# Patient Record
Sex: Female | Born: 1970 | Race: White | Hispanic: No | Smoking: Current some day smoker
Health system: Southern US, Community
[De-identification: ages and names within clinical notes are randomized; demographics above are authoritative.]

## PROBLEM LIST (undated history)

## (undated) DIAGNOSIS — K589 Irritable bowel syndrome without diarrhea: Secondary | ICD-10-CM

## (undated) DIAGNOSIS — G8929 Other chronic pain: Secondary | ICD-10-CM

## (undated) DIAGNOSIS — K859 Acute pancreatitis without necrosis or infection, unspecified: Secondary | ICD-10-CM

## (undated) DIAGNOSIS — L409 Psoriasis, unspecified: Secondary | ICD-10-CM

## (undated) DIAGNOSIS — B9689 Other specified bacterial agents as the cause of diseases classified elsewhere: Secondary | ICD-10-CM

## (undated) DIAGNOSIS — K219 Gastro-esophageal reflux disease without esophagitis: Secondary | ICD-10-CM

## (undated) DIAGNOSIS — N760 Acute vaginitis: Secondary | ICD-10-CM

## (undated) DIAGNOSIS — F419 Anxiety disorder, unspecified: Secondary | ICD-10-CM

## (undated) DIAGNOSIS — F329 Major depressive disorder, single episode, unspecified: Secondary | ICD-10-CM

## (undated) DIAGNOSIS — F32A Depression, unspecified: Secondary | ICD-10-CM

## (undated) DIAGNOSIS — R109 Unspecified abdominal pain: Secondary | ICD-10-CM

## (undated) DIAGNOSIS — F1010 Alcohol abuse, uncomplicated: Secondary | ICD-10-CM

## (undated) DIAGNOSIS — F410 Panic disorder [episodic paroxysmal anxiety] without agoraphobia: Secondary | ICD-10-CM

## (undated) DIAGNOSIS — E119 Type 2 diabetes mellitus without complications: Secondary | ICD-10-CM

## (undated) HISTORY — DX: Gastro-esophageal reflux disease without esophagitis: K21.9

## (undated) HISTORY — DX: Type 2 diabetes mellitus without complications: E11.9

## (undated) HISTORY — DX: Acute pancreatitis without necrosis or infection, unspecified: K85.90

## (undated) HISTORY — DX: Acute vaginitis: N76.0

## (undated) HISTORY — PX: BLADDER SURGERY: SHX569

## (undated) HISTORY — DX: Irritable bowel syndrome, unspecified: K58.9

## (undated) HISTORY — DX: Other specified bacterial agents as the cause of diseases classified elsewhere: B96.89

---

## 2001-09-06 ENCOUNTER — Ambulatory Visit (HOSPITAL_BASED_OUTPATIENT_CLINIC_OR_DEPARTMENT_OTHER): Admission: RE | Admit: 2001-09-06 | Discharge: 2001-09-06 | Payer: Self-pay | Admitting: Urology

## 2001-11-19 ENCOUNTER — Ambulatory Visit (HOSPITAL_COMMUNITY): Admission: RE | Admit: 2001-11-19 | Discharge: 2001-11-19 | Payer: Self-pay | Admitting: Family Medicine

## 2001-11-19 ENCOUNTER — Encounter: Payer: Self-pay | Admitting: Family Medicine

## 2001-12-23 ENCOUNTER — Encounter: Payer: Self-pay | Admitting: *Deleted

## 2001-12-23 ENCOUNTER — Emergency Department (HOSPITAL_COMMUNITY): Admission: EM | Admit: 2001-12-23 | Discharge: 2001-12-23 | Payer: Self-pay | Admitting: *Deleted

## 2002-04-15 ENCOUNTER — Emergency Department (HOSPITAL_COMMUNITY): Admission: EM | Admit: 2002-04-15 | Discharge: 2002-04-15 | Payer: Self-pay | Admitting: Emergency Medicine

## 2002-04-25 ENCOUNTER — Ambulatory Visit (HOSPITAL_COMMUNITY): Admission: RE | Admit: 2002-04-25 | Discharge: 2002-04-25 | Payer: Self-pay

## 2002-04-25 ENCOUNTER — Encounter: Payer: Self-pay | Admitting: Family Medicine

## 2002-08-29 ENCOUNTER — Encounter: Payer: Self-pay | Admitting: Emergency Medicine

## 2002-08-29 ENCOUNTER — Emergency Department (HOSPITAL_COMMUNITY): Admission: EM | Admit: 2002-08-29 | Discharge: 2002-08-29 | Payer: Self-pay | Admitting: Emergency Medicine

## 2002-10-19 ENCOUNTER — Emergency Department (HOSPITAL_COMMUNITY): Admission: EM | Admit: 2002-10-19 | Discharge: 2002-10-19 | Payer: Self-pay | Admitting: *Deleted

## 2002-11-07 ENCOUNTER — Ambulatory Visit (HOSPITAL_BASED_OUTPATIENT_CLINIC_OR_DEPARTMENT_OTHER): Admission: RE | Admit: 2002-11-07 | Discharge: 2002-11-07 | Payer: Self-pay | Admitting: Urology

## 2003-10-20 ENCOUNTER — Ambulatory Visit (HOSPITAL_COMMUNITY): Admission: RE | Admit: 2003-10-20 | Discharge: 2003-10-20 | Payer: Self-pay | Admitting: Urology

## 2003-10-20 ENCOUNTER — Ambulatory Visit (HOSPITAL_BASED_OUTPATIENT_CLINIC_OR_DEPARTMENT_OTHER): Admission: RE | Admit: 2003-10-20 | Discharge: 2003-10-20 | Payer: Self-pay | Admitting: Urology

## 2004-12-31 ENCOUNTER — Ambulatory Visit (HOSPITAL_COMMUNITY): Admission: RE | Admit: 2004-12-31 | Discharge: 2004-12-31 | Payer: Self-pay | Admitting: Urology

## 2005-03-15 ENCOUNTER — Ambulatory Visit (HOSPITAL_COMMUNITY): Admission: RE | Admit: 2005-03-15 | Discharge: 2005-03-15 | Payer: Self-pay | Admitting: Family Medicine

## 2005-05-24 ENCOUNTER — Other Ambulatory Visit: Admission: RE | Admit: 2005-05-24 | Discharge: 2005-05-24 | Payer: Self-pay

## 2005-11-12 ENCOUNTER — Emergency Department (HOSPITAL_COMMUNITY): Admission: EM | Admit: 2005-11-12 | Discharge: 2005-11-12 | Payer: Self-pay | Admitting: Emergency Medicine

## 2006-02-28 ENCOUNTER — Encounter (INDEPENDENT_AMBULATORY_CARE_PROVIDER_SITE_OTHER): Payer: Self-pay | Admitting: *Deleted

## 2006-02-28 ENCOUNTER — Other Ambulatory Visit: Admission: RE | Admit: 2006-02-28 | Discharge: 2006-02-28 | Payer: Self-pay | Admitting: Unknown Physician Specialty

## 2006-02-28 ENCOUNTER — Encounter (INDEPENDENT_AMBULATORY_CARE_PROVIDER_SITE_OTHER): Payer: Self-pay | Admitting: Specialist

## 2006-05-24 ENCOUNTER — Emergency Department (HOSPITAL_COMMUNITY): Admission: EM | Admit: 2006-05-24 | Discharge: 2006-05-24 | Payer: Self-pay | Admitting: Emergency Medicine

## 2008-08-01 ENCOUNTER — Ambulatory Visit (HOSPITAL_COMMUNITY): Admission: RE | Admit: 2008-08-01 | Discharge: 2008-08-01 | Payer: Self-pay | Admitting: Family Medicine

## 2009-06-29 ENCOUNTER — Emergency Department (HOSPITAL_COMMUNITY): Admission: EM | Admit: 2009-06-29 | Discharge: 2009-06-29 | Payer: Self-pay | Admitting: Emergency Medicine

## 2009-09-09 ENCOUNTER — Emergency Department (HOSPITAL_COMMUNITY): Admission: EM | Admit: 2009-09-09 | Discharge: 2009-09-09 | Payer: Self-pay | Admitting: Emergency Medicine

## 2010-07-31 ENCOUNTER — Emergency Department (HOSPITAL_COMMUNITY)
Admission: EM | Admit: 2010-07-31 | Discharge: 2010-08-01 | Disposition: A | Discharge status: Home / Self Care | Payer: Self-pay | Attending: Emergency Medicine | Admitting: Emergency Medicine

## 2010-07-31 DIAGNOSIS — J40 Bronchitis, not specified as acute or chronic: Secondary | ICD-10-CM | POA: Insufficient documentation

## 2010-07-31 DIAGNOSIS — B9689 Other specified bacterial agents as the cause of diseases classified elsewhere: Secondary | ICD-10-CM | POA: Insufficient documentation

## 2010-07-31 DIAGNOSIS — R112 Nausea with vomiting, unspecified: Secondary | ICD-10-CM | POA: Insufficient documentation

## 2010-07-31 DIAGNOSIS — A499 Bacterial infection, unspecified: Secondary | ICD-10-CM | POA: Insufficient documentation

## 2010-07-31 DIAGNOSIS — N760 Acute vaginitis: Secondary | ICD-10-CM | POA: Insufficient documentation

## 2010-07-31 LAB — URINALYSIS, ROUTINE W REFLEX MICROSCOPIC
Bilirubin Urine: NEGATIVE
Leukocytes, UA: NEGATIVE
Nitrite: NEGATIVE
pH: 6 (ref 5.0–8.0)

## 2010-07-31 LAB — CBC
HCT: 34.1 % — ABNORMAL LOW (ref 36.0–46.0)
MCH: 25.9 pg — ABNORMAL LOW (ref 26.0–34.0)
MCV: 84 fL (ref 78.0–100.0)
Platelets: 380 10*3/uL (ref 150–400)

## 2010-07-31 LAB — WET PREP, GENITAL: Yeast Wet Prep HPF POC: NONE SEEN

## 2010-07-31 LAB — URINE MICROSCOPIC-ADD ON

## 2010-07-31 LAB — PREGNANCY, URINE: Preg Test, Ur: NEGATIVE

## 2010-07-31 LAB — DIFFERENTIAL
Basophils Absolute: 0 10*3/uL (ref 0.0–0.1)
Basophils Relative: 0 % (ref 0–1)
Lymphocytes Relative: 9 % — ABNORMAL LOW (ref 12–46)
Lymphs Abs: 0.9 10*3/uL (ref 0.7–4.0)
Monocytes Absolute: 0.8 10*3/uL (ref 0.1–1.0)
Monocytes Relative: 8 % (ref 3–12)

## 2010-07-31 LAB — BASIC METABOLIC PANEL
CO2: 21 mEq/L (ref 19–32)
Calcium: 8.5 mg/dL (ref 8.4–10.5)
Chloride: 107 mEq/L (ref 96–112)
GFR calc Af Amer: >60 mL/min (ref >60)
GFR calc non Af Amer: >60 mL/min (ref >60)
Glucose, Bld: 129 mg/dL — ABNORMAL HIGH (ref 70–99)
Sodium: 140 mEq/L (ref 135–145)

## 2010-08-01 ENCOUNTER — Emergency Department (HOSPITAL_COMMUNITY): Payer: Self-pay

## 2010-08-01 LAB — RPR: RPR Ser Ql: NONREACTIVE

## 2010-08-02 LAB — GC/CHLAMYDIA PROBE AMP, GENITAL: Chlamydia, DNA Probe: NEGATIVE

## 2010-08-05 ENCOUNTER — Emergency Department (HOSPITAL_COMMUNITY)
Admission: EM | Admit: 2010-08-05 | Discharge: 2010-08-06 | Disposition: A | Discharge status: Home / Self Care | Payer: Self-pay | Attending: Emergency Medicine | Admitting: Emergency Medicine

## 2010-08-05 DIAGNOSIS — R300 Dysuria: Secondary | ICD-10-CM | POA: Insufficient documentation

## 2010-08-05 DIAGNOSIS — R109 Unspecified abdominal pain: Secondary | ICD-10-CM | POA: Insufficient documentation

## 2010-08-05 DIAGNOSIS — R110 Nausea: Secondary | ICD-10-CM | POA: Insufficient documentation

## 2010-08-06 LAB — URINALYSIS, ROUTINE W REFLEX MICROSCOPIC
Leukocytes, UA: NEGATIVE
Specific Gravity, Urine: 1.01 (ref 1.005–1.030)
Urine Glucose, Fasting: NEGATIVE mg/dL
pH: 6.5 (ref 5.0–8.0)

## 2010-08-06 LAB — URINE MICROSCOPIC-ADD ON

## 2010-09-05 LAB — URINALYSIS, ROUTINE W REFLEX MICROSCOPIC
Glucose, UA: NEGATIVE mg/dL
Ketones, ur: NEGATIVE mg/dL
Leukocytes, UA: NEGATIVE
Nitrite: NEGATIVE
Protein, ur: NEGATIVE mg/dL
Specific Gravity, Urine: 1.015 (ref 1.005–1.030)
pH: 5.5 (ref 5.0–8.0)

## 2010-10-29 NOTE — Op Note (Signed)
NAME:  Monique Nguyen, ROSADO             ACCOUNT NO.:  [id number]   MEDICAL RECORD NO.:  [id number]          PATIENT TYPE:  AMB   LOCATION:  DAY                          FACILITY:  [facility]   PHYSICIAN:  Jamison Neighbor, M.D.  DATE OF BIRTH:  19-Dec-1970   DATE OF PROCEDURE:  12/31/2004  DATE OF DISCHARGE:                                 OPERATIVE REPORT   SERVICE:  Urology.   PREOPERATIVE DIAGNOSES:  Chronic painful bladder syndrome, interstitial  cystitis.   POSTOPERATIVE DIAGNOSES:  Chronic painful bladder syndrome, interstitial  cystitis.   PROCEDURE:  Cystoscopy, urethral calibration, hydrodistention of the  bladder, Marcaine and Pyridium installation, Marcaine and Kenalog injection.   SURGEON:  Jamison Neighbor, M.D.   ANESTHESIA:  General.   COMPLICATIONS:  None.   DRAINS:  None.   BRIEF HISTORY:  This 40 year old female has been a longstanding patient of  Dr. Patsi Sears. She came to me for a second opinion concerning interstitial  cystitis. The patient previously had been told in the past that she had  chronic UTI's but more recently has been told that she has interstitial  cystitis. She has had hydrodistention before in the past with good results.  The patient has requested that this be repeated. We may have to do this in  order to give her an opinion as to the situation so far as her bladder is  concerned. She understands the risks and benefits of the procedure and gave  full and informed consent.   DESCRIPTION OF PROCEDURE:  After successful induction of general anesthesia,  the patient was placed in the dorsal lithotomy position, prepped with  Betadine and draped in the usual sterile fashion. Careful bimanual  examination showed no abnormality of the urethra, there was no evidence of a  cystocele, rectocele or enterocele. There were no masses on bimanual exam.  The uterus was palpably normal. There was no discharge of any kind and no  signs of urethral diverticulum. The  urethra was calibrated to 51 [place] with  female urethral sounds with no evidence of stenosis or stricture. The  cystoscope was inserted, the bladder was carefully inspected and was free of  any tumor or stones. Both ureteral orifices were normal in configuration and  location. The bladder was then distended at a pressure of 100 cmH2O for five  minutes. When the bladder was drained, the patient had a bladder capacity of  1500 mL which is excellent and compares to a bladder capacity of 1150 and an  average for interstitial cystitis of 575 under full anesthesia. The patient  had very modest glomerulations certainly within the range of normal. This  would really suggest that the patient may not have classic interstitial  cystitis. The bladder was noted to have trabeculations raising the question  as to whether the patient may have some degree of pelvic floor dysfunction  that might be amendable to physical therapy. The patient did not require a  biopsy. Two previous biopsy sites were  identified. The bladder was drained, a mixture of Marcaine and Pyridium was  left within the bladder, Marcaine and Kenalog were  injected periurethrally.  The patient tolerated the procedure well and was taken to the recovery room  in good condition.     _______________    RJE/MEDQ  D:  12/31/2004  T:  12/31/2004  Job:  [id number]

## 2010-10-29 NOTE — Op Note (Signed)
NAME:  Monique Nguyen, Monique Nguyen                       ACCOUNT NO.:  [id number]   MEDICAL RECORD NO.:  [id number]                   PATIENT TYPE:  AMB   LOCATION:  [facility]                                 FACILITY:  [facility]   PHYSICIAN:  Sigmund I. Patsi Sears, M.D.         DATE OF BIRTH:  06-17-1970   DATE OF PROCEDURE:  10/20/2003  DATE OF DISCHARGE:                                 OPERATIVE REPORT   PREOPERATIVE DIAGNOSIS:  Interstitial cystitis.   POSTOPERATIVE DIAGNOSIS:  Interstitial cystitis.   OPERATION:  1. Cystourethroscopy.  2. Hydrodistention of the bladder.  3. Clorpactin insertion.  4. Pyridium insertion.  5. Marcaine, Kenalog injection in the base of the bladder.   SURGEON:  Sigmund I. Patsi Sears, M.D.   ANESTHESIA:  General LMA.   PREPARATION:  After appropriate pre-anesthesia, the patient was brought to  the operating room, placed upon the operating table in the dorsal supine  position where general LMA anesthesia was introduced.  She was then replaced  in a dorsal lithotomy position where the pubis was prepped with Betadine  solution and draped in the usual fashion.   PROCEDURE:  Cystourethroscopy was accomplished, and showed that the patient  had a 1,000 mL bladder capacity.  Repeat cystoscopy showed that the patient  had massive hemorrhages associated with interstitial cystitis.  The patient  had previously had bladder biopsy and it was felt not necessary for this  repeat cysto-HOD.  Therefore, Clorpactin was inserted in the bladder for 3  minutes, and then the bladder drained of fluid.  The bladder was then filled  with saline solution, and this was then drained out of the bladder.  The  patient then had Pyridium Marcaine solution inserted in the bladder, and  this remained in the bladder.  Marcaine Kenalog was then injected at the  bladder base.  Previously, the patient was given a __________ suppository,  and then she was given IV Toradol prior to awaking.  She was  covered with  Ancef for the procedure.  She was awakened, taken to the recovery room in  good condition.                                               Sigmund I. Patsi Sears, M.D.    SIT/MEDQ  D:  10/20/2003  T:  10/20/2003  Job:  [id number]

## 2010-11-01 ENCOUNTER — Emergency Department (HOSPITAL_COMMUNITY)
Admission: EM | Admit: 2010-11-01 | Discharge: 2010-11-02 | Disposition: A | Discharge status: Home / Self Care | Payer: Self-pay | Attending: Emergency Medicine | Admitting: Emergency Medicine

## 2010-11-01 DIAGNOSIS — K5289 Other specified noninfective gastroenteritis and colitis: Secondary | ICD-10-CM | POA: Insufficient documentation

## 2010-11-01 DIAGNOSIS — F329 Major depressive disorder, single episode, unspecified: Secondary | ICD-10-CM | POA: Insufficient documentation

## 2010-11-01 DIAGNOSIS — F3289 Other specified depressive episodes: Secondary | ICD-10-CM | POA: Insufficient documentation

## 2010-11-01 LAB — URINALYSIS, ROUTINE W REFLEX MICROSCOPIC
Ketones, ur: NEGATIVE mg/dL
Leukocytes, UA: NEGATIVE
Protein, ur: NEGATIVE mg/dL
Specific Gravity, Urine: 1.015 (ref 1.005–1.030)
Urobilinogen, UA: 0.2 mg/dL (ref 0.0–1.0)
pH: 5 (ref 5.0–8.0)

## 2010-11-01 LAB — URINE MICROSCOPIC-ADD ON

## 2010-11-01 LAB — POCT PREGNANCY, URINE: Preg Test, Ur: NEGATIVE

## 2010-11-10 ENCOUNTER — Ambulatory Visit (INDEPENDENT_AMBULATORY_CARE_PROVIDER_SITE_OTHER): Payer: Self-pay | Admitting: Gastroenterology

## 2010-11-10 ENCOUNTER — Encounter: Payer: Self-pay | Admitting: Gastroenterology

## 2010-11-10 VITALS — BP 102/66 | HR 112 | Temp 98.1°F | Ht 61.0 in | Wt 99.8 lb

## 2010-11-10 DIAGNOSIS — R110 Nausea: Secondary | ICD-10-CM

## 2010-11-10 DIAGNOSIS — R197 Diarrhea, unspecified: Secondary | ICD-10-CM

## 2010-11-10 DIAGNOSIS — N898 Other specified noninflammatory disorders of vagina: Secondary | ICD-10-CM

## 2010-11-10 DIAGNOSIS — R109 Unspecified abdominal pain: Secondary | ICD-10-CM

## 2010-11-10 MED ORDER — ONDANSETRON HCL 4 MG PO TABS: 4.0000 mg | ORAL_TABLET | Freq: Three times a day (TID) | ORAL | Status: DC | PRN

## 2010-11-10 MED ORDER — DICYCLOMINE HCL 10 MG PO CAPS: 10.0000 mg | ORAL_CAPSULE | Freq: Four times a day (QID) | ORAL | Status: AC

## 2010-11-10 MED ORDER — DEXLANSOPRAZOLE 60 MG PO CPDR: 60.0000 mg | DELAYED_RELEASE_CAPSULE | Freq: Every day | ORAL | Status: AC

## 2010-11-10 MED ORDER — ONDANSETRON HCL 4 MG PO TABS: 4.0000 mg | ORAL_TABLET | Freq: Three times a day (TID) | ORAL | Status: AC | PRN

## 2010-11-10 NOTE — Progress Notes (Signed)
Called Zofran 4mg  # 20 to [employer] [facility]. (One every 8 hours as needed for nausea)

## 2010-11-10 NOTE — Progress Notes (Signed)
Referring Provider: Kirk Ruths, MD Primary Care Physician:  Kirk Ruths, MD Primary Gastroenterologist:  Dr. Darrick Penna  Chief Complaint  Patient presents with  . Nausea  . Diarrhea    HPI:  Monique Nguyen is a 40 y.o. female here as a referral from Dr. Regino Schultze for diarrhea, abdominal pain, nausea. She is quite eccentric; it was somewhat difficult to get information as to her clinical history. She was quite talkative and discussed issues that did not pertain to GI (such as bacterial vaginosis, prior ETOH use, etc). She began by saying that she wanted to confess something. States her husband passed away in 2011-02-21due to brain cancer; she was left with 3 kids. She started drinking heavily in December 2011, but she quit 2 mos ago. She wonders if this may have something to do with her current symptoms.  She reports a chronic vaginal discharge X 6 weeks; states she has been diagnosed with BV. She has been on several different abx per her report. She states at the onset of the abx, she began to experience watery diarrhea. This is not daily; she has bouts of loose stools, with some days not BM at all. The most loose stools she will have is 3 in one day. This is usually postprandial. Hx IBS  Wakes in evening with crampy, lower abdominal pain, relieved after loose stools. Takes Miralax every other day to "stay regular". Was given Lomotil in ED, which she states has significantly improved symptoms. Denies melena or brbpr.   Also complains of nausea, onset about 6 weeks ago. Constant. Not worsened/exacerbated by eating or drinking. LUQ pain occasionally with eating, lasting about 15 minutes.Decreased appetite. No dysphagia/odynophagia.Hx of reflux, took Prilosec in past. Aleve rarely. No ASA powders. +smoker. Denies fever/chills. Feels edgy/anxious. Requesting more Zofran.    Past Medical History  Diagnosis Date  . GERD (gastroesophageal reflux disease)   . Interstitial cystitis   .  Bacterial vaginosis     Past Surgical History  Procedure Date  . Bladder surgery     X 3    Current Outpatient Prescriptions  Medication Sig Dispense Refill  . citalopram (CELEXA) 40 MG tablet Take 40 mg by mouth daily.        . clonazePAM (KLONOPIN) 2 MG tablet Take 2 mg by mouth 2 (two) times daily as needed.              . diphenoxylate-atropine (LOMOTIL) 2.5-0.025 MG per tablet Take 1 tablet by mouth 4 (four) times daily as needed.        . ondansetron (ZOFRAN) 4 MG tablet Take 1 tablet (4 mg total) by mouth every 8 (eight) hours as needed for nausea.  20 tablet  0    Allergies as of 11/10/2010 - Review Complete 11/10/2010  Allergen Reaction Noted  . Sulfa antibiotics  11/10/2010    Family History  Problem Relation Age of Onset  . Depression Mother     living  . Pancreatic cancer Father     living    History   Social History  . Marital Status: Widowed    Spouse Name: N/A    Number of Children: N/A  . Years of Education: N/A   Occupational History  . Not on file.   Social History Main Topics  . Smoking status: Current Everyday Smoker -- 1.5 packs/day    Types: Cigarettes  . Smokeless tobacco: Not on file  . Alcohol Use: No  . Drug Use: No  Review of Systems: Gen: Denies any fever, chills, sweats, anorexia, fatigue, weakness, malaise, weight loss, and sleep disorder CV: Denies chest pain, angina, palpitations, syncope, orthopnea, PND, peripheral edema, and claudication. Resp: Denies dyspnea at rest, dyspnea with exercise, cough, sputum, wheezing, coughing up blood, and pleurisy. GI: Denies vomiting blood, jaundice, and fecal incontinence.   Denies dysphagia or odynophagia. GU : Denies urinary burning, blood in urine, urinary frequency, urinary hesitancy, nocturnal urination, and urinary incontinence. MS: Denies joint pain, limitation of movement, and swelling, stiffness, low back pain, extremity pain. Denies muscle weakness, cramps, atrophy.  Derm:  Denies rash, itching, dry skin, hives, moles, warts, or unhealing ulcers.  Psych: Denies depression, anxiety, memory loss, suicidal ideation, hallucinations, paranoia, and confusion. Heme: Denies bruising, bleeding, and enlarged lymph nodes.  Physical Exam: BP 102/66  Pulse 112  Temp(Src) 98.1 F (36.7 C) (Temporal)  Ht 5\' 1"  (1.549 m)  Wt 99 lb 12.8 oz (45.269 kg)  BMI 18.86 kg/m2  LMP 10/18/2010 General:   Alert,  Petite but well-nourished.  Head:  Normocephalic and atraumatic. Eyes:  Sclera clear, no icterus.   Conjunctiva pink. Ears:  Normal auditory acuity. Nose:  No deformity, discharge,  or lesions. Mouth:  No deformity or lesions, dentition normal. Neck:  Supple; no masses or thyromegaly. Lungs:  Clear throughout to auscultation.   No wheezes, crackles, or rhonchi. No acute distress. Heart:  Regular rate and rhythm; no murmurs, clicks, rubs,  or gallops. Abdomen:  Soft, nontender and nondistended. No masses, hepatosplenomegaly or hernias noted. Normal bowel sounds, without guarding, and without rebound.   Msk:  Symmetrical without gross deformities. Normal posture. Extremities:  Without clubbing or edema. Neurologic:  Alert and  oriented x4;  grossly normal neurologically. Skin:  Intact without significant lesions or rashes. Cervical Nodes:  No significant cervical adenopathy. Psych:  Alert and cooperative. Normal mood and affect.

## 2010-11-10 NOTE — Patient Instructions (Addendum)
Complete lab work and stool studies.   Once stool studies returned to our office, we will start a trial of Bentyl.  Avoid dairy for now to see if this helps with your symptoms.   Start taking Dexilant 30 minutes before breakfast daily. This may take 10-14 days for full affect.  Follow a high fiber diet. Do not take Miralax for now. See the handout on High fiber and IBS. Do not take Lomotil anymore.   We will refer you to see Dr. Emelda Fear again.   We may need to do further tests after the labs are completed. We will be calling you with these results in the next few days. We also will need you to check back with our office in 2 weeks to let us know how you are doing. At that time, we may need to pursue other tests.   Please call us if nausea or abdominal pain worsens. We will be in contact soon.

## 2010-11-11 DIAGNOSIS — N898 Other specified noninflammatory disorders of vagina: Secondary | ICD-10-CM | POA: Insufficient documentation

## 2010-11-11 DIAGNOSIS — R197 Diarrhea, unspecified: Secondary | ICD-10-CM | POA: Insufficient documentation

## 2010-11-11 DIAGNOSIS — R112 Nausea with vomiting, unspecified: Secondary | ICD-10-CM | POA: Insufficient documentation

## 2010-11-11 DIAGNOSIS — R109 Unspecified abdominal pain: Secondary | ICD-10-CM | POA: Insufficient documentation

## 2010-11-11 NOTE — Assessment & Plan Note (Addendum)
Hx of IBS, noticed increase in loose stools X 6 weeks since start of abx for bacterial vaginosis. Appears to be improving since onset of lomotil. Max stools per day is 3: usually postprandial. Most likely IBS, mixed, as pt will alternate constipation/diarrhea. Please see notes under abdominal pain. Will check Cdiff due to recent abx, add fiber to diet, trial of bentyl, avoid dairy, PR in 2 weeks. Anxiety is likely exacerbating clinical course.

## 2010-11-11 NOTE — Assessment & Plan Note (Signed)
Lower abdominal crampy pain, relieved by BM. Likely hx of IBS is culprit, as pt will have alternating constipation/diarrhea. No fever/chills. No melena or brbpr. Will manage with dietary measures, medication.   High fiber diet Obtain stool studies Stop Lomitil. Hold Miralax for now.  Trial of Bentyl Consider imaging if further issues

## 2010-11-11 NOTE — Assessment & Plan Note (Signed)
Chronic vaginal discharge, will refer to Dr. Emelda Fear for further follow-up.

## 2010-11-11 NOTE — Assessment & Plan Note (Addendum)
Onset 6 weeks ago, described as constant, not worsened by eating/drinking. Occasional LUQ pain with eating lasting about 15 minutes, but not constant. Reports decreased appetite, no dysphagia/odynophagia. Used to take Prilosec in past due to hx of reflux. Differentials include gastritis, PUD, perhaps biliary component as gallbladder remains in situ. Will obtain baseline labs, start dexilant. Consider upper endoscopy if no improvement in symptoms with PPI.  CBC, CMP Dexilant samples provided and prescription Possible EGD in near future Short course of Zofran till PPI takes full effect

## 2010-11-12 LAB — CBC WITH DIFFERENTIAL/PLATELET
Basophils Absolute: 0 10*3/uL (ref 0.0–0.1)
Basophils Relative: 0 % (ref 0–1)
Eosinophils Absolute: 0 10*3/uL (ref 0.0–0.7)
Eosinophils Relative: 0 % (ref 0–5)
HCT: 35 % — ABNORMAL LOW (ref 36.0–46.0)
MCHC: 31.1 g/dL (ref 30.0–36.0)
MCV: 81.6 fL (ref 78.0–100.0)
Monocytes Absolute: 0.5 10*3/uL (ref 0.1–1.0)
RDW: 18.7 % — ABNORMAL HIGH (ref 11.5–15.5)

## 2010-11-12 LAB — COMPREHENSIVE METABOLIC PANEL
AST: 25 U/L (ref 0–37)
Alkaline Phosphatase: 52 U/L (ref 39–117)
BUN: 6 mg/dL (ref 6–23)
Calcium: 9.7 mg/dL (ref 8.4–10.5)
Creat: 0.59 mg/dL (ref 0.40–1.20)
Total Bilirubin: 0.4 mg/dL (ref 0.3–1.2)

## 2010-11-12 NOTE — Progress Notes (Signed)
Cc to PCP 

## 2010-11-15 ENCOUNTER — Telehealth: Payer: Self-pay

## 2010-11-15 NOTE — Telephone Encounter (Signed)
Pt called- left voicemail- stated the medication AS gave her was not working, her "stomach has been tore up for 2 day" and she is still having diarrhea.  Please advise.

## 2010-11-16 NOTE — Telephone Encounter (Signed)
LMOM for pt to call. 

## 2010-11-16 NOTE — Telephone Encounter (Signed)
Appears Cdiff negative. LFTs look normal. She is anemic, yet somewhat improved from last blood draw.   Make sure she is avoiding dairy, high fiber, may take imodium as needed for diarrhea.  Let's get an ifobt. We most likely will be setting up for colonoscopy in the future if +, and considering she is anemic. She needs to be on Dexilant for about 10 days total. If no improvement, we can do an EGD/colon.   Also, pt was requesting Xanax at last visit. Ask if she has talked to her PCP about this. Anxiety may be significantly exacerbating her symptoms.

## 2010-11-17 NOTE — Telephone Encounter (Signed)
Pt returned call and was informed. She said the Dicyclomine tore her stomach up and she had  difficulty urinating. So she stopped it. She will come by and pick up the iFOBT container. Said she could not get any medicine for anxiety until she has an OV. She has appt with GYN on 11/22/2010.

## 2010-11-18 NOTE — Telephone Encounter (Signed)
Great. When we receive ifobt, we will proceed from there. Continue Dexilant daily. Imodium as needed.

## 2010-11-19 NOTE — Telephone Encounter (Signed)
Pt aware.

## 2010-11-25 NOTE — Progress Notes (Signed)
IF STOOL STUDIES NEGATIVE, PT NEEDS TTG IgA.

## 2010-11-26 ENCOUNTER — Encounter: Payer: Self-pay | Admitting: Gastroenterology

## 2010-11-26 NOTE — Progress Notes (Unsigned)
  Per Dr. Darrick Penna' recommendations, let's get a celiac panel. Her stool studies were negative. I believe she is supposed to be obtaining an ifobt. When she returns that, we can have the labs ready for her. Please call and remind her:) Thanks!

## 2010-11-30 ENCOUNTER — Other Ambulatory Visit: Payer: Self-pay

## 2010-11-30 DIAGNOSIS — R110 Nausea: Secondary | ICD-10-CM

## 2010-11-30 NOTE — Progress Notes (Signed)
LMOM for pt to call. Lab orders ready to mail or fax.

## 2010-11-30 NOTE — Progress Notes (Signed)
LMOM to call.

## 2010-12-01 NOTE — Progress Notes (Signed)
LMOM to call. Also, mailed letter to call and mailed new lab orders to pt.

## 2011-01-25 ENCOUNTER — Emergency Department (HOSPITAL_COMMUNITY): Payer: Self-pay

## 2011-01-25 ENCOUNTER — Other Ambulatory Visit: Payer: Self-pay

## 2011-01-25 ENCOUNTER — Emergency Department (HOSPITAL_COMMUNITY)
Admission: EM | Admit: 2011-01-25 | Discharge: 2011-01-25 | Discharge status: Against Medical Advice | Payer: Self-pay | Attending: Emergency Medicine | Admitting: Emergency Medicine

## 2011-01-25 ENCOUNTER — Encounter (HOSPITAL_COMMUNITY): Payer: Self-pay | Admitting: *Deleted

## 2011-01-25 DIAGNOSIS — R5383 Other fatigue: Secondary | ICD-10-CM | POA: Insufficient documentation

## 2011-01-25 DIAGNOSIS — E876 Hypokalemia: Secondary | ICD-10-CM | POA: Insufficient documentation

## 2011-01-25 DIAGNOSIS — T43591A Poisoning by other antipsychotics and neuroleptics, accidental (unintentional), initial encounter: Secondary | ICD-10-CM | POA: Insufficient documentation

## 2011-01-25 DIAGNOSIS — R5381 Other malaise: Secondary | ICD-10-CM | POA: Insufficient documentation

## 2011-01-25 DIAGNOSIS — F17200 Nicotine dependence, unspecified, uncomplicated: Secondary | ICD-10-CM | POA: Insufficient documentation

## 2011-01-25 DIAGNOSIS — T424X1A Poisoning by benzodiazepines, accidental (unintentional), initial encounter: Secondary | ICD-10-CM

## 2011-01-25 DIAGNOSIS — T424X4A Poisoning by benzodiazepines, undetermined, initial encounter: Secondary | ICD-10-CM | POA: Insufficient documentation

## 2011-01-25 DIAGNOSIS — K219 Gastro-esophageal reflux disease without esophagitis: Secondary | ICD-10-CM | POA: Insufficient documentation

## 2011-01-25 LAB — COMPREHENSIVE METABOLIC PANEL
ALT: 12 U/L (ref 0–35)
Albumin: 2.7 g/dL — ABNORMAL LOW (ref 3.5–5.2)
Alkaline Phosphatase: 97 U/L (ref 39–117)
BUN: 6 mg/dL (ref 6–23)
Potassium: 2.6 mEq/L — CL (ref 3.5–5.1)
Sodium: 137 mEq/L (ref 135–145)
Total Protein: 6.6 g/dL (ref 6.0–8.3)

## 2011-01-25 LAB — CBC
MCH: 26.6 pg (ref 26.0–34.0)
MCHC: 32.5 g/dL (ref 30.0–36.0)
Platelets: 265 10*3/uL (ref 150–400)

## 2011-01-25 LAB — DIFFERENTIAL
Basophils Relative: 0 % (ref 0–1)
Eosinophils Absolute: 0.1 10*3/uL (ref 0.0–0.7)
Neutrophils Relative %: 73 % (ref 43–77)

## 2011-01-25 LAB — ETHANOL: Alcohol, Ethyl (B): <11 mg/dL (ref 0–11)

## 2011-01-25 MED ORDER — POTASSIUM CHLORIDE 20 MEQ PO PACK
40.0000 meq | PACK | Freq: Once | ORAL | Status: AC
  Administered 2011-01-25: 40 meq via ORAL
  Filled 2011-01-25: qty 2

## 2011-01-25 MED ORDER — SODIUM CHLORIDE 0.9 % IV BOLUS (SEPSIS)
1000.0000 mL | Freq: Once | INTRAVENOUS | Status: AC
  Administered 2011-01-25: 1000 mL via INTRAVENOUS

## 2011-01-25 MED ORDER — POTASSIUM CHLORIDE 10 MEQ/100ML IV SOLN
10.0000 meq | Freq: Once | INTRAVENOUS | Status: AC
  Administered 2011-01-25: 10 meq via INTRAVENOUS
  Filled 2011-01-25: qty 100

## 2011-01-25 MED ORDER — SODIUM CHLORIDE 0.9 % IV SOLN: INTRAVENOUS | Status: DC

## 2011-01-25 MED ORDER — POTASSIUM CHLORIDE CRYS ER 20 MEQ PO TBCR: 20.0000 meq | EXTENDED_RELEASE_TABLET | Freq: Two times a day (BID) | ORAL | Status: DC

## 2011-01-25 NOTE — ED Provider Notes (Signed)
History     CSN: [id number] Arrival date & time: 01/25/2011  6:51 PM  Chief Complaint  Patient presents with  . Chest Pain  . Weakness  . Fatigue   HPI  Past Medical History  Diagnosis Date  . GERD (gastroesophageal reflux disease)   . Interstitial cystitis   . Bacterial vaginosis     Past Surgical History  Procedure Date  . Bladder surgery     X 3    Family History  Problem Relation Age of Onset  . Depression Mother     living  . Pancreatic cancer Father     living    History  Substance Use Topics  . Smoking status: Current Everyday Smoker -- 1.5 packs/day    Types: Cigarettes  . Smokeless tobacco: Not on file  . Alcohol Use: No    OB History    Grav Para Term Preterm Abortions TAB SAB Ect Mult Living                  Review of Systems  Physical Exam  BP 105/68  Pulse 110  Temp(Src) 98.8 F (37.1 C) (Oral)  Resp 16  Ht 5\' 1"  (1.549 m)  Wt 110 lb (49.896 kg)  BMI 20.78 kg/m2  SpO2 99%  LMP 01/11/2011  Physical Exam  ED Course  Procedures  MDM        Donnetta Hutching, MD 01/25/11 2202

## 2011-01-25 NOTE — ED Notes (Signed)
Pt reports chest pain began a few days ago.  Reports that 2 days ago she was having an increase in anxiety and took 5 klonipin to help her sleep.  Pt appears drowsy, but does interact appropriately.  Pt denies suicidal thoughts. EDP at bedside at this time.

## 2011-01-25 NOTE — ED Notes (Addendum)
2110  Pt states she is unable to urinate and provide sample for ordered tests.  Pt has refused to be cathed.  Explained to pt that when she felt she was able to urinate, to inform staff, but reassured pt to keep calm.  Pt requesting medications to help with her anxiety.  Explained to pt, based on her recently taking 5 klonipin, additional medication would probably not be advisable, but that the physician would be in to talk to her when able.

## 2011-01-25 NOTE — ED Provider Notes (Signed)
History     CSN: [id number] Arrival date & time: 01/25/2011  6:51 PM  Chief Complaint  Patient presents with  . Chest Pain  . Weakness  . Fatigue   HPI Pt reports about 2 days ago she took 4-5 klonopin in order to get more sleep and treat her generalized anxiety disorder. She apparently slept for 2 days. Today she came to the ED for evaluation of weakness and chest pain. Her pain apparently started prior to taking the klonopin and she associates it with her anxiety. Pain now is mild, mid sternal, non-radiating. No SOB. She feels generally weak and sleepy. Has had depressed mood and increased anxiety recently, but no suicidal thoughts. This was not a suicide attempt.  Past Medical History  Diagnosis Date  . GERD (gastroesophageal reflux disease)   . Interstitial cystitis   . Bacterial vaginosis     Past Surgical History  Procedure Date  . Bladder surgery     X 3    Family History  Problem Relation Age of Onset  . Depression Mother     living  . Pancreatic cancer Father     living    History  Substance Use Topics  . Smoking status: Current Everyday Smoker -- 1.5 packs/day    Types: Cigarettes  . Smokeless tobacco: Not on file  . Alcohol Use: No    OB History    Grav Para Term Preterm Abortions TAB SAB Ect Mult Living                  Review of Systems All other systems reviewed and are negative except as noted in HPI.   Physical Exam  BP 105/68  Pulse 110  Temp(Src) 98.8 F (37.1 C) (Oral)  Resp 16  Ht 5\' 1"  (1.549 m)  Wt 110 lb (49.896 kg)  BMI 20.78 kg/m2  SpO2 99%  LMP 01/11/2011  Physical Exam  Nursing note and vitals reviewed. Constitutional: She is oriented to person, place, and time. She appears well-developed and well-nourished.  HENT:  Head: Normocephalic and atraumatic.  Eyes: EOM are normal. Pupils are equal, round, and reactive to light.  Neck: Normal range of motion. Neck supple.  Cardiovascular: Normal rate, normal heart sounds and  intact distal pulses.   Pulmonary/Chest: Effort normal and breath sounds normal.  Abdominal: Bowel sounds are normal. She exhibits no distension. There is no tenderness.  Musculoskeletal: Normal range of motion. She exhibits no edema and no tenderness.  Neurological: She is alert and oriented to person, place, and time. No cranial nerve deficit.  Skin: Skin is warm and dry. No rash noted.  Psychiatric: She has a normal mood and affect.    ED Course  Procedures  MDM  Date: 01/25/2011  Rate: 101  Rhythm: sinus tachycardia  QRS Axis: normal  Intervals: normal  ST/T Wave abnormalities: normal  Conduction Disutrbances:none  Narrative Interpretation:   Old EKG Reviewed: none available   8:58 PM Pt resting comfortably. Labs unremarkable, except for hypokalemia. Given IV and PO in the ED. Awaiting UA, pt states cannot urinate and refuses cath. Care signed out to Dr. Adriana Simas at the change of shift.   Charles B. Bernette Mayers, MD 01/25/11 2059

## 2011-01-25 NOTE — ED Notes (Signed)
CRITICAL VALUE ALERT  Critical value received:  Potasium 2.6  Date of notification: 01/25/2011   Time of notification:  2010  Critical value read back: yes,  Dr. Bernette Mayers notified.  Nurse who received alert: J.Lynzee Lindquist

## 2011-01-25 NOTE — ED Notes (Addendum)
2130  Pt removed IV and walked out of department.  Attempted to ask pt why she was leaving and discover cause of upset.  Pt refused to stop walking.  Unable to discuss AMA with pt.

## 2011-01-25 NOTE — ED Notes (Signed)
Pt c/o aching chest pain weakness and fatigue. Pt states she has taken 5 Klonopin 2 days ago and she has been asleep since. Pt does not know what day today is. Pt states she did not want to hurt herself she just wanted to get some rest.

## 2011-04-04 ENCOUNTER — Encounter (HOSPITAL_COMMUNITY): Payer: Self-pay | Admitting: *Deleted

## 2011-04-04 ENCOUNTER — Emergency Department (HOSPITAL_COMMUNITY)
Admission: EM | Admit: 2011-04-04 | Discharge: 2011-04-04 | Discharge status: Against Medical Advice | Payer: Self-pay | Attending: Emergency Medicine | Admitting: Emergency Medicine

## 2011-04-04 DIAGNOSIS — F17200 Nicotine dependence, unspecified, uncomplicated: Secondary | ICD-10-CM | POA: Insufficient documentation

## 2011-04-04 DIAGNOSIS — F329 Major depressive disorder, single episode, unspecified: Secondary | ICD-10-CM | POA: Insufficient documentation

## 2011-04-04 DIAGNOSIS — K219 Gastro-esophageal reflux disease without esophagitis: Secondary | ICD-10-CM | POA: Insufficient documentation

## 2011-04-04 DIAGNOSIS — F3289 Other specified depressive episodes: Secondary | ICD-10-CM | POA: Insufficient documentation

## 2011-04-04 LAB — URINALYSIS, ROUTINE W REFLEX MICROSCOPIC
Glucose, UA: NEGATIVE mg/dL
Ketones, ur: NEGATIVE mg/dL
Leukocytes, UA: NEGATIVE
Protein, ur: NEGATIVE mg/dL
Specific Gravity, Urine: 1.01 (ref 1.005–1.030)
Urobilinogen, UA: 0.2 mg/dL (ref 0.0–1.0)

## 2011-04-04 LAB — URINE MICROSCOPIC-ADD ON

## 2011-04-04 LAB — RAPID URINE DRUG SCREEN, HOSP PERFORMED
Amphetamines: NOT DETECTED
Benzodiazepines: NOT DETECTED
Cocaine: NOT DETECTED

## 2011-04-04 LAB — PREGNANCY, URINE: Preg Test, Ur: NEGATIVE

## 2011-04-04 NOTE — ED Provider Notes (Signed)
History   This chart was scribed for Monique Hutching, MD by Clarita Crane. The patient was seen in room APA06/APA06 and the patient's care was started at 7:01PM.   CSN: [id number] Arrival date & time: 04/04/2011  6:04 PM   First MD Initiated Contact with Patient 04/04/11 1827      Chief Complaint  Patient presents with  . Fatigue   HPI Monique Nguyen is a 40 y.o. female who presents to the Emergency Department complaining of depression which began several weeks ago and has been persistent since. Patient reports she has been experiencing increased stress and depression because she recently broke up with her boyfriend. Patient also notes she is experiencing increased stress and guilt as a result of losing her husband 2 years ago to cancer. Patient reports having an episode in which she began experiencing SOB as a result of stress and anxiety. Denies EtOH use, SI, HI.   Past Medical History  Diagnosis Date  . GERD (gastroesophageal reflux disease)   . Interstitial cystitis   . Bacterial vaginosis     Past Surgical History  Procedure Date  . Bladder surgery     X 3    Family History  Problem Relation Age of Onset  . Depression Mother     living  . Pancreatic cancer Father     living    History  Substance Use Topics  . Smoking status: Current Everyday Smoker -- 1.5 packs/day    Types: Cigarettes  . Smokeless tobacco: Not on file  . Alcohol Use: No    OB History    Grav Para Term Preterm Abortions TAB SAB Ect Mult Living                  Review of Systems 10 Systems reviewed and are negative for acute change except as noted in the HPI.  Allergies  Sulfa antibiotics  Home Medications   Current Outpatient Rx  Name Route Sig Dispense Refill  . CITALOPRAM HYDROBROMIDE 40 MG PO TABS Oral Take 40 mg by mouth daily.      Marland Kitchen CLONAZEPAM 2 MG PO TABS Oral Take 2 mg by mouth 2 (two) times daily as needed. For anxiety and sleep    . DIPHENOXYLATE-ATROPINE 2.5-0.025 MG PO TABS  Oral Take 1 tablet by mouth 4 (four) times daily as needed.      Marland Kitchen POTASSIUM CHLORIDE CRYS CR 20 MEQ PO TBCR Oral Take 1 tablet (20 mEq total) by mouth 2 (two) times daily. 14 tablet 0    BP 117/80  Pulse 102  Temp(Src) 98.1 F (36.7 C) (Oral)  Resp 18  Ht 5\' 1"  (1.549 m)  Wt 100 lb 1.6 oz (45.405 kg)  BMI 18.91 kg/m2  SpO2 100%  LMP 02/21/2011  Physical Exam  Nursing note and vitals reviewed. Constitutional: She is oriented to person, place, and time. She appears well-developed and well-nourished. No distress.  HENT:  Head: Normocephalic and atraumatic.  Eyes: EOM are normal. Pupils are equal, round, and reactive to light.  Neck: Neck supple. No tracheal deviation present.  Cardiovascular: Normal rate.   Pulmonary/Chest: Effort normal. No respiratory distress.  Abdominal: She exhibits no distension.  Musculoskeletal: Normal range of motion. She exhibits no edema.  Neurological: She is alert and oriented to person, place, and time. No sensory deficit.  Skin: Skin is warm and dry.  Psychiatric: Her speech is normal and behavior is normal. She exhibits a depressed mood. She expresses no homicidal and no suicidal  ideation.    ED Course  Procedures (including critical care time)  DIAGNOSTIC STUDIES: Oxygen Saturation is 100% on room air, normal by my interpretation.    COORDINATION OF CARE:    Labs Reviewed  URINALYSIS, ROUTINE W REFLEX MICROSCOPIC - Abnormal; Notable for the following:    Hgb urine dipstick TRACE (*)    All other components within normal limits  URINE MICROSCOPIC-ADD ON   No results found.   No diagnosis found.    MDM  Patient is experiencing depression from the death of her husband 2 years ago along with a failed recent relationship. She is not homicidal or suicidal. We'll attempt to give behavioral health to evaluate patient Will call behavioral health counselor for evaluation         Monique Hutching, MD 04/04/11 2307

## 2011-04-04 NOTE — ED Notes (Signed)
Pt requesting to leave.  Reinforced with pt the dangers of leaving AMA.  Pt states "I know what to do.  I'll see my doctor and get on my medications.  Encouraged pt to stay and see Samson Frederic with ACT team.  Pt declined.  Requested pt return to her room so I could bring AMA paperwork.

## 2011-04-04 NOTE — ED Notes (Signed)
Pt walked out of department without signing AMA paperwork.

## 2011-04-04 NOTE — ED Notes (Signed)
Pt states fatigue/weakness x 2 days. States SOB at times. Pt also states she noticed her urine was cloudy and had foul smell, beginning yesterday.

## 2011-06-29 ENCOUNTER — Other Ambulatory Visit (HOSPITAL_COMMUNITY): Payer: Self-pay | Admitting: Family Medicine

## 2011-06-29 DIAGNOSIS — Z139 Encounter for screening, unspecified: Secondary | ICD-10-CM

## 2011-07-04 ENCOUNTER — Ambulatory Visit (HOSPITAL_COMMUNITY): Payer: Self-pay

## 2011-08-10 ENCOUNTER — Encounter (HOSPITAL_COMMUNITY): Payer: Self-pay | Admitting: *Deleted

## 2011-08-10 ENCOUNTER — Emergency Department (HOSPITAL_COMMUNITY)
Admission: EM | Admit: 2011-08-10 | Discharge: 2011-08-10 | Disposition: A | Discharge status: Home / Self Care | Payer: Self-pay | Attending: Emergency Medicine | Admitting: Emergency Medicine

## 2011-08-10 DIAGNOSIS — R197 Diarrhea, unspecified: Secondary | ICD-10-CM | POA: Insufficient documentation

## 2011-08-10 DIAGNOSIS — R5381 Other malaise: Secondary | ICD-10-CM | POA: Insufficient documentation

## 2011-08-10 DIAGNOSIS — N3010 Interstitial cystitis (chronic) without hematuria: Secondary | ICD-10-CM | POA: Insufficient documentation

## 2011-08-10 DIAGNOSIS — R109 Unspecified abdominal pain: Secondary | ICD-10-CM | POA: Insufficient documentation

## 2011-08-10 DIAGNOSIS — R110 Nausea: Secondary | ICD-10-CM | POA: Insufficient documentation

## 2011-08-10 DIAGNOSIS — F17200 Nicotine dependence, unspecified, uncomplicated: Secondary | ICD-10-CM | POA: Insufficient documentation

## 2011-08-10 DIAGNOSIS — K219 Gastro-esophageal reflux disease without esophagitis: Secondary | ICD-10-CM | POA: Insufficient documentation

## 2011-08-10 LAB — RAPID URINE DRUG SCREEN, HOSP PERFORMED
Barbiturates: NOT DETECTED
Benzodiazepines: NOT DETECTED
Cocaine: NOT DETECTED
Opiates: NOT DETECTED

## 2011-08-10 LAB — COMPREHENSIVE METABOLIC PANEL
ALT: 12 U/L (ref 0–35)
Calcium: 8.9 mg/dL (ref 8.4–10.5)
Creatinine, Ser: 0.42 mg/dL — ABNORMAL LOW (ref 0.50–1.10)
GFR calc Af Amer: >90 mL/min (ref >90)
Glucose, Bld: 113 mg/dL — ABNORMAL HIGH (ref 70–99)
Sodium: 140 mEq/L (ref 135–145)
Total Protein: 7.4 g/dL (ref 6.0–8.3)

## 2011-08-10 LAB — DIFFERENTIAL
Basophils Absolute: 0 10*3/uL (ref 0.0–0.1)
Eosinophils Absolute: 0.2 10*3/uL (ref 0.0–0.7)
Eosinophils Relative: 2 % (ref 0–5)
Lymphs Abs: 1.1 10*3/uL (ref 0.7–4.0)
Monocytes Absolute: 0.6 10*3/uL (ref 0.1–1.0)

## 2011-08-10 LAB — CBC
MCH: 28.5 pg (ref 26.0–34.0)
MCV: 86.9 fL (ref 78.0–100.0)
Platelets: 308 10*3/uL (ref 150–400)
RDW: 16.7 % — ABNORMAL HIGH (ref 11.5–15.5)

## 2011-08-10 LAB — URINALYSIS, ROUTINE W REFLEX MICROSCOPIC
Bilirubin Urine: NEGATIVE
Glucose, UA: NEGATIVE mg/dL
Ketones, ur: NEGATIVE mg/dL
Protein, ur: NEGATIVE mg/dL
pH: 5.5 (ref 5.0–8.0)

## 2011-08-10 LAB — URINE MICROSCOPIC-ADD ON

## 2011-08-10 MED ORDER — SODIUM CHLORIDE 0.9 % IV SOLN: Freq: Once | INTRAVENOUS | Status: DC

## 2011-08-10 MED ORDER — ONDANSETRON HCL 4 MG PO TABS: 4.0000 mg | ORAL_TABLET | Freq: Four times a day (QID) | ORAL | Status: AC

## 2011-08-10 MED ORDER — LOPERAMIDE HCL 2 MG PO CAPS
2.0000 mg | ORAL_CAPSULE | ORAL | Status: DC | PRN
Start: 2011-08-10 — End: 2011-08-10
  Administered 2011-08-10: 2 mg via ORAL
  Filled 2011-08-10: qty 1

## 2011-08-10 MED ORDER — LOPERAMIDE HCL 2 MG PO CAPS: 2.0000 mg | ORAL_CAPSULE | ORAL | Status: AC | PRN

## 2011-08-10 MED ORDER — ONDANSETRON HCL 4 MG/2ML IJ SOLN
4.0000 mg | Freq: Once | INTRAMUSCULAR | Status: AC
  Administered 2011-08-10: 4 mg via INTRAVENOUS
  Filled 2011-08-10: qty 2

## 2011-08-10 MED ORDER — SODIUM CHLORIDE 0.9 % IV BOLUS (SEPSIS)
1000.0000 mL | Freq: Once | INTRAVENOUS | Status: AC
  Administered 2011-08-10: 1000 mL via INTRAVENOUS

## 2011-08-10 NOTE — Discharge Instructions (Signed)
Diarrhea Infections caused by germs (bacterial) or a virus commonly cause diarrhea. Your caregiver has determined that with time, rest and fluids, the diarrhea should improve. In general, eat normally while drinking more water than usual. Although water may prevent dehydration, it does not contain salt and minerals (electrolytes). Broths, weak tea without caffeine and oral rehydration solutions (ORS) replace fluids and electrolytes. Small amounts of fluids should be taken frequently. Large amounts at one time may not be tolerated. Plain water may be harmful in infants and the elderly. Oral rehydrating solutions (ORS) are available at pharmacies and grocery stores. ORS replace water and important electrolytes in proper proportions. Sports drinks are not as effective as ORS and may be harmful due to sugars worsening diarrhea.  ORS is especially recommended for use in children with diarrhea. As a general guideline for children, replace any new fluid losses from diarrhea and/or vomiting with ORS as follows:   If your child weighs 22 pounds or under (10 kg or less), give 60-120 mL ( -  cup or 2 - 4 ounces) of ORS for each episode of diarrheal stool or vomiting episode.   If your child weighs more than 22 pounds (more than 10 kgs), give 120-240 mL ( - 1 cup or 4 - 8 ounces) of ORS for each diarrheal stool or episode of vomiting.   While correcting for dehydration, children should eat normally. However, foods high in sugar should be avoided because this may worsen diarrhea. Large amounts of carbonated soft drinks, juice, gelatin desserts and other highly sugared drinks should be avoided.   After correction of dehydration, other liquids that are appealing to the child may be added. Children should drink small amounts of fluids frequently and fluids should be increased as tolerated. Children should drink enough fluids to keep urine clear or pale yellow.   Adults should eat normally while drinking more fluids  than usual. Drink small amounts of fluids frequently and increase as tolerated. Drink enough fluids to keep urine clear or pale yellow. Broths, weak decaffeinated tea, lemon lime soft drinks (allowed to go flat) and ORS replace fluids and electrolytes.   Avoid:   Carbonated drinks.   Juice.   Extremely hot or cold fluids.   Caffeine drinks.   Fatty, greasy foods.   Alcohol.   Tobacco.   Too much intake of anything at one time.   Gelatin desserts.   Probiotics are active cultures of beneficial bacteria. They may lessen the amount and number of diarrheal stools in adults. Probiotics can be found in yogurt with active cultures and in supplements.   Wash hands well to avoid spreading bacteria and virus.   Anti-diarrheal medications are not recommended for infants and children.   Only take over-the-counter or prescription medicines for pain, discomfort or fever as directed by your caregiver. Do not give aspirin to children because it may cause Reye's Syndrome.   For adults, ask your caregiver if you should continue all prescribed and over-the-counter medicines.   If your caregiver has given you a follow-up appointment, it is very important to keep that appointment. Not keeping the appointment could result in a chronic or permanent injury, and disability. If there is any problem keeping the appointment, you must call back to this facility for assistance.  SEEK IMMEDIATE MEDICAL CARE IF:   You or your child is unable to keep fluids down or other symptoms or problems become worse in spite of treatment.   Vomiting or diarrhea develops and becomes persistent.     There is vomiting of blood or bile (green material).   There is blood in the stool or the stools are black and tarry.   There is no urine output in 6-8 hours or there is only a small amount of very dark urine.   Abdominal pain develops, increases or localizes.   You have a fever.   Your baby is older than 3 months with a  rectal temperature of 102 F (38.9 C) or higher.   Your baby is 3 months old or younger with a rectal temperature of 100.4 F (38 C) or higher.   You or your child develops excessive weakness, dizziness, fainting or extreme thirst.   You or your child develops a rash, stiff neck, severe headache or become irritable or sleepy and difficult to awaken.  MAKE SURE YOU:   Understand these instructions.   Will watch your condition.   Will get help right away if you are not doing well or get worse.  Document Released: 05/20/2002 Document Revised: 02/09/2011 Document Reviewed: 04/06/2009 [employer] Patient Information 2012 [employer]. 

## 2011-08-10 NOTE — ED Notes (Signed)
Pt c/o nausea, diarrhea, chills 3 days. States that she feels like her head is spinning.

## 2011-08-10 NOTE — ED Notes (Signed)
Family at bedside. Patient does not need anything at this time. 

## 2011-08-10 NOTE — ED Provider Notes (Signed)
History     CSN: [id number]  Arrival date & time 08/10/11  1640   First MD Initiated Contact with Patient 08/10/11 1716      Chief Complaint  Patient presents with  . Diarrhea    (Consider location/radiation/quality/duration/timing/severity/associated sxs/prior treatment) Patient is a 41 y.o. female presenting with diarrhea. The history is provided by the patient.  Diarrhea The primary symptoms include fatigue, abdominal pain, nausea and diarrhea. Primary symptoms do not include fever, vomiting, jaundice or dysuria. The illness began 3 to 5 days ago. The onset was sudden. The problem has not changed since onset. The abdominal pain began more than 2 days ago. The abdominal pain has been gradually worsening since its onset. The abdominal pain is generalized. The abdominal pain does not radiate. The abdominal pain is relieved by nothing.  The diarrhea began 3 to 5 days ago. The diarrhea is watery. The diarrhea occurs 2 to 4 times per day.  The illness does not include chills or anorexia. Associated medical issues do not include inflammatory bowel disease.    Past Medical History  Diagnosis Date  . GERD (gastroesophageal reflux disease)   . Interstitial cystitis   . Bacterial vaginosis     Past Surgical History  Procedure Date  . Bladder surgery     X 3    Family History  Problem Relation Age of Onset  . Depression Mother     living  . Pancreatic cancer Father     living    History  Substance Use Topics  . Smoking status: Current Everyday Smoker -- 1.5 packs/day    Types: Cigarettes  . Smokeless tobacco: Not on file  . Alcohol Use: Yes    OB History    Grav Para Term Preterm Abortions TAB SAB Ect Mult Living                  Review of Systems  Constitutional: Positive for fatigue. Negative for fever and chills.  Gastrointestinal: Positive for nausea, abdominal pain and diarrhea. Negative for vomiting, anorexia and jaundice.  Genitourinary: Negative for  dysuria.  All other systems reviewed and are negative.    Allergies  Sulfa antibiotics  Home Medications   Current Outpatient Rx  Name Route Sig Dispense Refill  . CITALOPRAM HYDROBROMIDE 40 MG PO TABS Oral Take 40 mg by mouth daily.      Marland Kitchen CLONAZEPAM 2 MG PO TABS Oral Take 2 mg by mouth 3 (three) times daily as needed. For anxiety and sleep    . DIPHENOXYLATE-ATROPINE 2.5-0.025 MG PO TABS Oral Take 1 tablet by mouth 4 (four) times daily as needed.     Marland Kitchen POTASSIUM CHLORIDE CRYS ER 20 MEQ PO TBCR Oral Take 1 tablet (20 mEq total) by mouth 2 (two) times daily. 14 tablet 0  . POTASSIUM PO Oral Take 1 tablet by mouth daily.        BP 115/71  Pulse 107  Temp(Src) 98.3 F (36.8 C) (Oral)  Resp 17  Ht 5' (1.524 m)  Wt 100 lb (45.36 kg)  BMI 19.53 kg/m2  SpO2 100%  LMP 07/31/2011  Physical Exam  Nursing note and vitals reviewed. Constitutional: She is oriented to person, place, and time. Vital signs are normal. She appears well-developed and well-nourished.  Non-toxic appearance. No distress.  HENT:  Head: Normocephalic and atraumatic.  Eyes: Conjunctivae, EOM and lids are normal. Pupils are equal, round, and reactive to light.  Neck: Normal range of motion. Neck supple. No tracheal deviation  present. No mass present.  Cardiovascular: Normal rate, regular rhythm and normal heart sounds.  Exam reveals no gallop.   No murmur heard. Pulmonary/Chest: Effort normal and breath sounds normal. No stridor. No respiratory distress. She has no decreased breath sounds. She has no wheezes. She has no rhonchi. She has no rales.  Abdominal: Soft. Normal appearance and bowel sounds are normal. She exhibits no distension. There is no tenderness. There is no rebound and no CVA tenderness.  Musculoskeletal: Normal range of motion. She exhibits no edema and no tenderness.  Neurological: She is alert and oriented to person, place, and time. She has normal strength. No cranial nerve deficit or sensory  deficit. GCS eye subscore is 4. GCS verbal subscore is 5. GCS motor subscore is 6.  Skin: Skin is warm and dry. No abrasion and no rash noted.  Psychiatric: She has a normal mood and affect. Her speech is normal and behavior is normal.    ED Course  Procedures (including critical care time)   Labs Reviewed  POCT PREGNANCY, URINE  URINALYSIS, ROUTINE W REFLEX MICROSCOPIC  CBC  DIFFERENTIAL  COMPREHENSIVE METABOLIC PANEL  LIPASE, BLOOD  URINE RAPID DRUG SCREEN (HOSP PERFORMED)   No results found.   No diagnosis found.    MDM  Patient given IV fluids and feels better. Patient's abdomen reexamined no signs of acute process. Will be discharged to        Toy Baker, MD 08/10/11 1919

## 2011-09-30 ENCOUNTER — Emergency Department (HOSPITAL_COMMUNITY)
Admission: EM | Admit: 2011-09-30 | Discharge: 2011-09-30 | Disposition: A | Discharge status: Home / Self Care | Payer: Self-pay | Attending: Emergency Medicine | Admitting: Emergency Medicine

## 2011-09-30 ENCOUNTER — Encounter (HOSPITAL_COMMUNITY): Payer: Self-pay | Admitting: *Deleted

## 2011-09-30 DIAGNOSIS — T169XXA Foreign body in ear, unspecified ear, initial encounter: Secondary | ICD-10-CM | POA: Insufficient documentation

## 2011-09-30 DIAGNOSIS — IMO0002 Reserved for concepts with insufficient information to code with codable children: Secondary | ICD-10-CM | POA: Insufficient documentation

## 2011-09-30 LAB — POCT PREGNANCY, URINE: Preg Test, Ur: NEGATIVE

## 2011-09-30 MED ORDER — ONDANSETRON HCL 4 MG PO TABS
4.0000 mg | ORAL_TABLET | Freq: Once | ORAL | Status: AC
  Administered 2011-09-30: 4 mg via ORAL
  Filled 2011-09-30: qty 1

## 2011-09-30 MED ORDER — AMOXICILLIN 500 MG PO CAPS: ORAL_CAPSULE | ORAL | Status: DC

## 2011-09-30 MED ORDER — HYDROCODONE-ACETAMINOPHEN 5-325 MG PO TABS: ORAL_TABLET | ORAL | Status: DC

## 2011-09-30 MED ORDER — PENICILLIN V POTASSIUM 250 MG PO TABS
500.0000 mg | ORAL_TABLET | Freq: Once | ORAL | Status: AC
  Administered 2011-09-30: 500 mg via ORAL
  Filled 2011-09-30: qty 2

## 2011-09-30 MED ORDER — HYDROCODONE-ACETAMINOPHEN 5-325 MG PO TABS
2.0000 | ORAL_TABLET | Freq: Once | ORAL | Status: AC
  Administered 2011-09-30: 2 via ORAL
  Filled 2011-09-30 (×2): qty 2

## 2011-09-30 MED ORDER — NEOMYCIN-POLYMYXIN-HC 3.5-10000-1 OT SOLN
3.0000 [drp] | Freq: Four times a day (QID) | OTIC | Status: DC
  Administered 2011-09-30 (×2): 3 [drp] via OTIC
  Filled 2011-09-30 (×2): qty 10

## 2011-09-30 NOTE — Discharge Instructions (Signed)
You have a foreign body in the right ear and a scratch of the external canal. Please use 3 drops of cortisporin in the right ear with breakfast, lunch, dinner, and at bedtime. Use tylenol for mild pain, and Norco for more severe pain. This may cause drowsiness. Amoxicillin 2 times daily with a meal. Please do not take asprin, or ibuprofen products, or alcohol until the foreign body is removed. ThanksEar Foreign Body An ear foreign body is an object that is stuck in the ear. Objects in the ear can cause pain, hearing loss, and buzzing or roaring sounds. They can also cause fluid to come from the ear. HOME CARE   Keep all doctor visits as told.   Keep small objects away from children. Tell them not to put things in their ears.  GET HELP RIGHT AWAY IF:   You have blood coming from your ear.   You have more pain or puffiness (swelling) in the ear.   You have trouble hearing.   You have fluid (discharge) coming from the ear.   You have a fever.   You get a headache.  MAKE SURE YOU:   Understand these instructions.   Will watch your condition.   Will get help right away if you are not doing well or get worse.  Document Released: 11/17/2009 Document Revised: 05/19/2011 Document Reviewed: 11/17/2009 [facility] Patient Information 2012 [place], [place].

## 2011-09-30 NOTE — ED Notes (Signed)
Pt refused vicodin for pain. Says she does not like the way it makes her feel.

## 2011-09-30 NOTE — ED Notes (Signed)
Pt has dried blood in ear canal after exam and placement of ear wick.

## 2011-09-30 NOTE — ED Provider Notes (Signed)
History     CSN: [id number]  Arrival date & time 09/30/11  1657   First MD Initiated Contact with Patient 09/30/11 1726      Chief Complaint  Patient presents with  . Cyst  . Foreign Body in Ear    (Consider location/radiation/quality/duration/timing/severity/associated sxs/prior treatment) HPI Comments: Patient states that she has a piercing of the right ear, and she wore the ear ring to be a approximately 2 weeks ago. The backing of the earring came off and went down into her right ear. The patient states she attempted home methods of trying to get the backing out, including peroxide, and Q-tips. The patient now states she has pain of the ear she's noted some mild drainage present. The patient also states that her menstrual cycle is a little bit late, and she would like to have a pregnancy test. The patient further states that she has from time to time" vaginal cyst" and wants to talk with someone about the cyst being present.  The history is provided by the patient and a friend.    Past Medical History  Diagnosis Date  . GERD (gastroesophageal reflux disease)   . Interstitial cystitis   . Bacterial vaginosis     Past Surgical History  Procedure Date  . Bladder surgery     X 3    Family History  Problem Relation Age of Onset  . Depression Mother     living  . Pancreatic cancer Father     living    History  Substance Use Topics  . Smoking status: Current Everyday Smoker -- 1.5 packs/day    Types: Cigarettes  . Smokeless tobacco: Not on file  . Alcohol Use: Yes    OB History    Grav Para Term Preterm Abortions TAB SAB Ect Mult Living                  Review of Systems  Constitutional: Negative for fever and chills.  HENT: Positive for ear pain and ear discharge.   Genitourinary:       Vaginal cyst    Allergies  Sulfa antibiotics  Home Medications   Current Outpatient Rx  Name Route Sig Dispense Refill  . CIPROFLOXACIN HCL 500 MG PO TABS Oral Take  500 mg by mouth 2 (two) times daily.    Marland Kitchen CLONAZEPAM 2 MG PO TABS Oral Take 2 mg by mouth 3 (three) times daily as needed. For anxiety and sleep      BP 123/81  Pulse 93  Temp(Src) 97.4 F (36.3 C) (Oral)  Resp 20  Ht 5\' 1"  (1.549 m)  Wt 109 lb (49.442 kg)  BMI 20.60 kg/m2  SpO2 100%  LMP 08/30/2011  Physical Exam  Nursing note and vitals reviewed. Constitutional: She is oriented to person, place, and time. She appears well-developed and well-nourished.  Non-toxic appearance.  HENT:  Head: Normocephalic.  Right Ear: Tympanic membrane and external ear normal.  Left Ear: Tympanic membrane and external ear normal.       There is mild pain to tugging on the external ear. There is mild-to-moderate swelling of the external auditory canal. There is a foreign body in the right external auditory canal. I cannot see the tympanic membrane at this time there is some mild puslike drainage in the floor of the external auditory canal. There is no posterior ear tenderness or lymphadenopathy appreciated.  Eyes: EOM and lids are normal. Pupils are equal, round, and reactive to light.  Neck: Normal  range of motion. Neck supple. Carotid bruit is not present.  Cardiovascular: Normal rate, regular rhythm, normal heart sounds, intact distal pulses and normal pulses.   Pulmonary/Chest: Breath sounds normal. No respiratory distress.  Abdominal: Soft. Bowel sounds are normal. There is no tenderness. There is no guarding.  Musculoskeletal: Normal range of motion.  Lymphadenopathy:       Head (right side): No submandibular adenopathy present.       Head (left side): No submandibular adenopathy present.    She has no cervical adenopathy.  Neurological: She is alert and oriented to person, place, and time. She has normal strength. No cranial nerve deficit or sensory deficit.  Skin: Skin is warm and dry.  Psychiatric: She has a normal mood and affect. Her speech is normal.    ED Course  Procedures: REMOVAL  OF EAR FOREIGN BODY - RIGHT. An attempt was made to remove the visualized foreign body in the external canal with alligator forceps. The patient complained of too much pain for this. An attempt was then made to irrigate the foreign body out with sterile saline. On the third attempt to flush the foreign body out, the patient jerked and sustained a scratch to the external auditory canal with bleeding. The patient then informed me that she had been using aspirin, as well as drinking alcohol. There was some problem in getting the oozing to stop. Packing was placed in the ear and the patient was allowed to wait for approximately 10 minutes. This slowed down enough to again see the foreign body attempt was again made to remove the foreign body with the alligator forceps. However the ear began to bleed yet again. At this point the patient was informed of the need to attempt to remove this on at another time. The ear wick was placed in the right ear. The patient was treated with Cortisporin otic drops. Treated with penicillin orally. Also treated with oral pain medication.I suggested the pateint return on 4/20, but  The patient has agreed to return to the emergency department on April 22.   Labs Reviewed  POCT PREGNANCY, URINE   No results found.   Dx: Foreign body right ear.  2. Abrasion right external ear canal.   MDM  I have reviewed nursing notes, vital signs, and all appropriate lab and imaging results for this patient. This patient sustained a foreign body (backing of an ear ring) approximately 2 weeks ago. The patient has been attempting to remove the foreign body with peroxide and Q-tips, without success. The patient presented today for assistance with this problem. Attempt to remove the foreign body was unsuccessful, the patient sustained a abrasion of the right external canal with some bleeding. It is of note that the patient has been using aspirin today as well as alcohol.  The patient had an ear  wick placed. Treated with Cortisporin otic drops and amoxicillin. Patient also treated with Norco 5 mg one or 2 tablets every 4 hours as needed for pain. I suggested the patient to return on tomorrow April 20, but the patient decided to return on April 22, to have the foreign body removed. The pain medication and antibiotics started here in the emergency department.  The patient requested a pregnancy test as her cycle with running" a little bit late", the pregnancy test was read as negative.       Kathie Dike, [place] 09/30/11 [phone]

## 2011-09-30 NOTE — ED Notes (Signed)
Pt concerned she may be pregnant. Also thinks she has back of earring in rt ear. Concerned about "cysts" of vulvar area.

## 2011-09-30 NOTE — ED Notes (Signed)
Pt presents to er with c/o a back of an ear-ring in right ear. Pt states that she had left an earring in over night and the back came off and went into right ear. Pt also c/o cysts to vaginal area. Pt states that she has problems with vaginal cyst. Denies any cyst to vaginal area at present time.

## 2011-10-01 NOTE — ED Provider Notes (Signed)
Medical screening examination/treatment/procedure(s) were performed by non-physician practitioner and as supervising physician I was immediately available for consultation/collaboration.   Jasani Dolney L Nyjae Hodge, MD 10/01/11 1525 

## 2011-10-14 ENCOUNTER — Emergency Department (HOSPITAL_COMMUNITY)
Admission: EM | Admit: 2011-10-14 | Discharge: 2011-10-14 | Discharge status: Against Medical Advice | Payer: Self-pay | Attending: Emergency Medicine | Admitting: Emergency Medicine

## 2011-10-14 ENCOUNTER — Encounter (HOSPITAL_COMMUNITY): Payer: Self-pay | Admitting: *Deleted

## 2011-10-14 DIAGNOSIS — R5383 Other fatigue: Secondary | ICD-10-CM

## 2011-10-14 DIAGNOSIS — F1010 Alcohol abuse, uncomplicated: Secondary | ICD-10-CM | POA: Insufficient documentation

## 2011-10-14 DIAGNOSIS — F17200 Nicotine dependence, unspecified, uncomplicated: Secondary | ICD-10-CM | POA: Insufficient documentation

## 2011-10-14 DIAGNOSIS — R5381 Other malaise: Secondary | ICD-10-CM | POA: Insufficient documentation

## 2011-10-14 DIAGNOSIS — R079 Chest pain, unspecified: Secondary | ICD-10-CM | POA: Insufficient documentation

## 2011-10-14 DIAGNOSIS — F1020 Alcohol dependence, uncomplicated: Secondary | ICD-10-CM

## 2011-10-14 LAB — URINALYSIS, ROUTINE W REFLEX MICROSCOPIC
Hgb urine dipstick: NEGATIVE
Leukocytes, UA: NEGATIVE
Nitrite: NEGATIVE
Protein, ur: NEGATIVE mg/dL
Specific Gravity, Urine: 1.005 (ref 1.005–1.030)
Urobilinogen, UA: 0.2 mg/dL (ref 0.0–1.0)

## 2011-10-14 LAB — POCT PREGNANCY, URINE: Preg Test, Ur: NEGATIVE

## 2011-10-14 NOTE — ED Provider Notes (Signed)
History     CSN: [id number]  Arrival date & time 10/14/11  0441   First MD Initiated Contact with Patient 10/14/11 0503      Chief Complaint  Patient presents with  . Fatigue    (Consider location/radiation/quality/duration/timing/severity/associated sxs/prior treatment) HPI Comments: Monique Nguyen is a 41 y.o. 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 Her major complaint is tiredness. She also relates that she is under stress with her son, who is autistic and her daughter, who has been troublesome, stress, related to drinking beer heavily, and thoughts about her husband, who died 2 years ago. She states she does not want to get help for drinking, and is not interested in him an in-depth emergency department evaluation. She states that she is late for her period. She denies fever, chills, nausea, vomiting, abdominal pain, back pain, weakness or dizziness  The history is provided by the patient.    Past Medical History  Diagnosis Date  . GERD (gastroesophageal reflux disease)   . Interstitial cystitis   . Bacterial vaginosis     Past Surgical History  Procedure Date  . Bladder surgery     X 3    Family History  Problem Relation Age of Onset  . Depression Mother     living  . Pancreatic cancer Father     living    History  Substance Use Topics  . Smoking status: Current Everyday Smoker -- 1.5 packs/day    Types: Cigarettes  . Smokeless tobacco: Not on file  . Alcohol Use: Yes    OB History    Grav Para Term Preterm Abortions TAB SAB Ect Mult Living                  Review of Systems  All other systems reviewed and are negative.    Allergies  Sulfa antibiotics  Home Medications   Current Outpatient Rx  Name Route Sig Dispense Refill  . AMOXICILLIN 500 MG PO CAPS  2 po bid with food 28 capsule 0  . ASPIRIN PO Oral Take 2 tablets by mouth as needed. For pain      . CITALOPRAM HYDROBROMIDE 40 MG PO TABS Oral Take 40 mg by mouth daily.    Marland Kitchen CLONAZEPAM 2 MG PO TABS Oral Take 2 mg by mouth 3 (three) times daily as needed. For anxiety and sleep    . HYDROCODONE-ACETAMINOPHEN 5-325 MG PO TABS  1 or 2 po q4h prn pain 20 tablet 0    BP 106/82  Pulse 70  Temp(Src) 97.5 F (36.4 C) (Oral)  Resp 16  Ht 5\' 1"  (1.549 m)  Wt 109 lb (49.442 kg)  BMI 20.60 kg/m2  SpO2 97%  LMP 08/30/2011  Physical Exam  Nursing note and vitals reviewed. Constitutional: She is oriented to person, place, and time. She appears well-developed and well-nourished.  HENT:  Head: Normocephalic and atraumatic.  Eyes: Conjunctivae and EOM are normal. Pupils are equal, round, and reactive to light.  Neck: Normal range of motion and phonation normal. Neck supple.  Cardiovascular: Normal rate, regular rhythm and intact distal pulses.   Pulmonary/Chest: Effort normal and breath sounds normal. She exhibits no tenderness.  Abdominal: Soft. She exhibits no distension and no mass. There is no tenderness. There is no guarding.       No hepatosplenomegaly  Musculoskeletal: Normal range of motion.  Neurological: She is alert  and oriented to person, place, and time. She has normal strength. She exhibits normal muscle tone.  Skin: Skin is warm and dry.  Psychiatric: Her behavior is normal. Judgment and thought content normal.       Depressed affect    ED Course  Procedures (including critical care time)  Labs Reviewed  URINALYSIS, ROUTINE W REFLEX MICROSCOPIC - Abnormal; Notable for the following:    Color, Urine STRAW (*)    All other components within normal limits  POCT PREGNANCY, URINE   No results found.   1. Lethargy   2. Alcoholism       MDM  Nonspecific lethargy, with normal vital signs. She is a alcohol abuser. She is not seeking detoxification. She sees a therapist currently.     Patient left AGAINST MEDICAL ADVICE prior to return of her urine test  results    Flint Melter, MD 10/14/11 [phone]

## 2011-10-14 NOTE — ED Notes (Signed)
Pt reports she has been very lethargic and has not been sleeping well, pt reports s&s have gotten worse since her deceased husband's recent birthday

## 2011-10-14 NOTE — ED Notes (Signed)
Pt upset about not being able to be treated in the same room as her boyfriend, it was explained to patient about hippa, pt was also advised by charge nurse that she needed to remain in her room until her ED treatment is complete, pt got dressed and walked out of ED AMA

## 2011-12-27 ENCOUNTER — Emergency Department (HOSPITAL_COMMUNITY)
Admission: EM | Admit: 2011-12-27 | Discharge: 2011-12-27 | Disposition: A | Discharge status: Home / Self Care | Payer: Self-pay | Attending: Emergency Medicine | Admitting: Emergency Medicine

## 2011-12-27 ENCOUNTER — Encounter (HOSPITAL_COMMUNITY): Payer: Self-pay | Admitting: Emergency Medicine

## 2011-12-27 DIAGNOSIS — F411 Generalized anxiety disorder: Secondary | ICD-10-CM | POA: Insufficient documentation

## 2011-12-27 DIAGNOSIS — N898 Other specified noninflammatory disorders of vagina: Secondary | ICD-10-CM | POA: Insufficient documentation

## 2011-12-27 DIAGNOSIS — R10819 Abdominal tenderness, unspecified site: Secondary | ICD-10-CM | POA: Insufficient documentation

## 2011-12-27 DIAGNOSIS — N390 Urinary tract infection, site not specified: Secondary | ICD-10-CM | POA: Insufficient documentation

## 2011-12-27 DIAGNOSIS — L819 Disorder of pigmentation, unspecified: Secondary | ICD-10-CM | POA: Insufficient documentation

## 2011-12-27 DIAGNOSIS — N939 Abnormal uterine and vaginal bleeding, unspecified: Secondary | ICD-10-CM

## 2011-12-27 DIAGNOSIS — R112 Nausea with vomiting, unspecified: Secondary | ICD-10-CM | POA: Insufficient documentation

## 2011-12-27 LAB — COMPREHENSIVE METABOLIC PANEL
ALT: 18 U/L (ref 0–35)
AST: 34 U/L (ref 0–37)
Albumin: 3.5 g/dL (ref 3.5–5.2)
CO2: 24 mEq/L (ref 19–32)
Calcium: 8.7 mg/dL (ref 8.4–10.5)
Chloride: 102 mEq/L (ref 96–112)
Creatinine, Ser: 0.38 mg/dL — ABNORMAL LOW (ref 0.50–1.10)
GFR calc non Af Amer: >90 mL/min (ref >90)
Sodium: 137 mEq/L (ref 135–145)
Total Bilirubin: 0.3 mg/dL (ref 0.3–1.2)

## 2011-12-27 LAB — CBC WITH DIFFERENTIAL/PLATELET
Basophils Absolute: 0 10*3/uL (ref 0.0–0.1)
Basophils Relative: 0 % (ref 0–1)
Lymphocytes Relative: 10 % — ABNORMAL LOW (ref 12–46)
MCHC: 33.8 g/dL (ref 30.0–36.0)
Monocytes Absolute: 0.7 10*3/uL (ref 0.1–1.0)
Neutro Abs: 7.1 10*3/uL (ref 1.7–7.7)
Platelets: 253 10*3/uL (ref 150–400)
RDW: 15.5 % (ref 11.5–15.5)
WBC: 8.8 10*3/uL (ref 4.0–10.5)

## 2011-12-27 LAB — URINALYSIS, ROUTINE W REFLEX MICROSCOPIC
Bilirubin Urine: NEGATIVE
Ketones, ur: NEGATIVE mg/dL
Nitrite: POSITIVE — AB
Urobilinogen, UA: 0.2 mg/dL (ref 0.0–1.0)

## 2011-12-27 LAB — URINE MICROSCOPIC-ADD ON

## 2011-12-27 MED ORDER — SODIUM CHLORIDE 0.9 % IV SOLN
1000.0000 mL | Freq: Once | INTRAVENOUS | Status: AC
  Administered 2011-12-27: 1000 mL via INTRAVENOUS

## 2011-12-27 MED ORDER — METOCLOPRAMIDE HCL 5 MG/ML IJ SOLN
10.0000 mg | Freq: Once | INTRAMUSCULAR | Status: AC
  Administered 2011-12-27: 10 mg via INTRAVENOUS
  Filled 2011-12-27: qty 2

## 2011-12-27 MED ORDER — SODIUM CHLORIDE 0.9 % IV SOLN
1000.0000 mL | INTRAVENOUS | Status: DC
  Administered 2011-12-27: 1000 mL via INTRAVENOUS

## 2011-12-27 MED ORDER — ONDANSETRON HCL 4 MG/2ML IJ SOLN
4.0000 mg | Freq: Once | INTRAMUSCULAR | Status: AC
  Administered 2011-12-27: 4 mg via INTRAVENOUS
  Filled 2011-12-27: qty 2

## 2011-12-27 MED ORDER — CEPHALEXIN 500 MG PO CAPS: 500.0000 mg | ORAL_CAPSULE | Freq: Three times a day (TID) | ORAL | Status: AC

## 2011-12-27 MED ORDER — PHENAZOPYRIDINE HCL 100 MG PO TABS
200.0000 mg | ORAL_TABLET | Freq: Once | ORAL | Status: AC
  Administered 2011-12-27: 200 mg via ORAL
  Filled 2011-12-27: qty 2

## 2011-12-27 MED ORDER — PHENAZOPYRIDINE HCL 200 MG PO TABS: 200.0000 mg | ORAL_TABLET | Freq: Three times a day (TID) | ORAL | Status: AC

## 2011-12-27 MED ORDER — KETOROLAC TROMETHAMINE 30 MG/ML IJ SOLN
30.0000 mg | Freq: Once | INTRAMUSCULAR | Status: AC
  Administered 2011-12-27: 30 mg via INTRAVENOUS
  Filled 2011-12-27: qty 1

## 2011-12-27 MED ORDER — PROMETHAZINE HCL 25 MG RE SUPP: 25.0000 mg | Freq: Four times a day (QID) | RECTAL | Status: DC | PRN

## 2011-12-27 MED ORDER — DIPHENHYDRAMINE HCL 50 MG/ML IJ SOLN
25.0000 mg | Freq: Once | INTRAMUSCULAR | Status: AC
  Administered 2011-12-27: 25 mg via INTRAVENOUS
  Filled 2011-12-27: qty 1

## 2011-12-27 MED ORDER — CEPHALEXIN 500 MG PO CAPS
500.0000 mg | ORAL_CAPSULE | Freq: Once | ORAL | Status: AC
  Administered 2011-12-27: 500 mg via ORAL
  Filled 2011-12-27: qty 1

## 2011-12-27 NOTE — ED Notes (Signed)
In to remove IV - patient states she did the removal herself.  IV catheter and tubing on stretcher intact.

## 2011-12-27 NOTE — ED Notes (Signed)
Request gingerale to drink - provided.  Awaiting discharge instructions at this time per patient

## 2011-12-27 NOTE — ED Notes (Signed)
Pt c/o vaginal bleeding and dysuria x 3 days. Some pelvic pain and n. Denies v/d. Nad noted.

## 2011-12-27 NOTE — ED Notes (Signed)
C/O  Nausea and "dry heaving"  States nausea earlier had been relieved.  Awake now and wants something else for nausea

## 2011-12-27 NOTE — ED Provider Notes (Signed)
History  This chart was scribed for Ward Givens, MD by Bennett Scrape. This patient was seen in room APA08/APA08 and the patient's care was started at 5:23PM.  CSN: [id number]  Arrival date & time 12/27/11  1710   First MD Initiated Contact with Patient 12/27/11 1723      Chief Complaint  Patient presents with  . Vaginal Bleeding  . Dysuria     Patient is a 41 y.o. female presenting with vaginal bleeding. The history is provided by the patient. No language interpreter was used.  Vaginal Bleeding This is a new problem. The current episode started more than 1 week ago. The problem occurs every several days. The problem has not changed since onset.Associated symptoms include abdominal pain. Pertinent negatives include no shortness of breath.    Monique Nguyen is a 41 y.o. female who presents to the Emergency Department complaining of 2 months of vaginal bleeding with associated suprapubic pain, lightheadedness, fatigue and one week of dysuria. The lightheadedness is worse with standing. She states that she has expeirenced 4 menstrual cycles over the past 2 months, each lasting 7 to 8 days. She denies having prior episodes of similar symptoms and is not following up with her PCP for the symptoms. Pt reports reduced appetite with associated nausea and diarrhea that are worse with eating. Pt is G3 P3 A0 and reports no current use of birth control.  Pt states that she was scheduled to follow up with the [facility] in [place] 6 months prior for an abnormal PAP smear but failed to do so. Pt also reports being on hydrocodone for a bladder infection March 2013 due to similar abdominal pain at the time. She denies vaginal discharge, emesis, dizziness, frequency, HA and CP as associated symptoms.  Pt reports everyday alcohol use of 15 cans/day and occasional marijuana use.  Pt has a h/o GERD, bacterial vaginosis, and interstitial cystitis and 3 bladder surgeries.  PCP is Dr. Phillips Odor.  Past Medical  History  Diagnosis Date  . GERD (gastroesophageal reflux disease)   . Interstitial cystitis   . Bacterial vaginosis     Past Surgical History  Procedure Date  . Bladder surgery     X 3    Family History  Problem Relation Age of Onset  . Depression Mother     living  . Pancreatic cancer Father     living    History  Substance Use Topics  . Smoking status: Current Everyday Smoker -- 1.5 packs/day    Types: Cigarettes  . Smokeless tobacco: Not on file  . Alcohol Use: Yes 15 cans of beer daily  Unemployed widowed  No OB history provided.  Review of Systems  Constitutional: Negative for fever.  Respiratory: Negative for cough, chest tightness and shortness of breath.   Gastrointestinal: Positive for nausea, abdominal pain and diarrhea. Negative for vomiting.  Genitourinary: Positive for dysuria, vaginal bleeding, vaginal pain, menstrual problem and pelvic pain. Negative for frequency and vaginal discharge.  Musculoskeletal: Negative for back pain.  Neurological: Negative for dizziness, weakness and light-headedness.  All other systems reviewed and are negative.    Allergies  Sulfa antibiotics  Home Medications   Current Outpatient Rx  Name Route Sig Dispense Refill  . CITALOPRAM HYDROBROMIDE 40 MG PO TABS Oral Take 40 mg by mouth daily.    Marland Kitchen CLONAZEPAM 2 MG PO TABS Oral Take 2 mg by mouth 3 (three) times daily as needed.    . IBUPROFEN 200 MG PO CAPS  Oral Take 2 capsules by mouth as needed.      BP 106/86  Pulse 97  Temp 97.7 F (36.5 C) (Oral)  Resp 16  Ht 5\' 1"  (1.549 m)  Wt 116 lb (52.617 kg)  BMI 21.92 kg/m2  SpO2 100%  LMP 12/27/2011  Vital signs normal    Physical Exam  Nursing note and vitals reviewed. Constitutional: She is oriented to person, place, and time. She appears well-developed and well-nourished.  HENT:  Head: Normocephalic and atraumatic.  Right Ear: External ear normal.  Left Ear: External ear normal.  Mouth/Throat: Oropharynx  is clear and moist.  Eyes: Conjunctivae and EOM are normal. Pupils are equal, round, and reactive to light.  Neck: Normal range of motion. No tracheal deviation present.  Cardiovascular: Normal rate, regular rhythm and normal heart sounds.   Pulmonary/Chest: Effort normal and breath sounds normal. No respiratory distress. She has no wheezes. She has no rales.  Abdominal: There is no rebound and no guarding.       Tender diffusely without guarding or rebound.  Genitourinary:       Normal external genitalia, some blood in the vault, no clots seen. She has diffuse tenderness over her adnexa in her uterus. Her uterus does not feel enlarged.  Musculoskeletal: Normal range of motion. She exhibits no edema.  Neurological: She is alert and oriented to person, place, and time. No cranial nerve deficit. Coordination normal.  Skin: Skin is warm and dry.       Multiple tattoos  Psychiatric: Her behavior is normal.       Anxious    ED Course  Procedures (including critical care time)  DIAGNOSTIC STUDIES: Oxygen Saturation is 100% on room air, normal by my interpretation.    COORDINATION OF CARE: 5:46PM- Discussed treatment plan which includes in-and-out cath with pelvic exam and pt agreed. 8:24PM-Pt rechecked and is still feeling mildly nauseated. Informed pt of lab results. Discussed discharge plan with pt and pt agreed to plan. Pt states that she will follow up with the [facility]. 9:49PM- Pt rechecked and states that her nausea has resolved. She plans to follow the discharge plans previously discussed.     Results for orders placed during the hospital encounter of 12/27/11  CBC WITH DIFFERENTIAL      Component Value Range   WBC 8.8  4.0 - 10.5 K/uL   RBC 4.15  3.87 - 5.11 MIL/uL   Hemoglobin 13.3  12.0 - 15.0 g/dL   HCT 16.1  09.6 - 04.5 %   MCV 94.7  78.0 - 100.0 fL   MCH 32.0  26.0 - 34.0 pg   MCHC 33.8  30.0 - 36.0 g/dL   RDW 40.9  81.1 - 91.4 %   Platelets 253  150 - 400 K/uL     Neutrophils Relative 81 (*) 43 - 77 %   Neutro Abs 7.1  1.7 - 7.7 K/uL   Lymphocytes Relative 10 (*) 12 - 46 %   Lymphs Abs 0.9  0.7 - 4.0 K/uL   Monocytes Relative 8  3 - 12 %   Monocytes Absolute 0.7  0.1 - 1.0 K/uL   Eosinophils Relative 1  0 - 5 %   Eosinophils Absolute 0.1  0.0 - 0.7 K/uL   Basophils Relative 0  0 - 1 %   Basophils Absolute 0.0  0.0 - 0.1 K/uL  COMPREHENSIVE METABOLIC PANEL      Component Value Range   Sodium 137  135 - 145  mEq/L   Potassium 3.3 (*) 3.5 - 5.1 mEq/L   Chloride 102  96 - 112 mEq/L   CO2 24  19 - 32 mEq/L   Glucose, Bld 122 (*) 70 - 99 mg/dL   BUN 5 (*) 6 - 23 mg/dL   Creatinine, Ser 1.61 (*) 0.50 - 1.10 mg/dL   Calcium 8.7  8.4 - 09.6 mg/dL   Total Protein 7.0  6.0 - 8.3 g/dL   Albumin 3.5  3.5 - 5.2 g/dL   AST 34  0 - 37 U/L   ALT 18  0 - 35 U/L   Alkaline Phosphatase 88  39 - 117 U/L   Total Bilirubin 0.3  0.3 - 1.2 mg/dL   GFR calc non Af Amer >90  >90 mL/min   GFR calc Af Amer >90  >90 mL/min  URINALYSIS, ROUTINE W REFLEX MICROSCOPIC      Component Value Range   Color, Urine YELLOW  YELLOW   APPearance CLEAR  CLEAR   Specific Gravity, Urine <1.005 (*) 1.005 - 1.030   pH 6.0  5.0 - 8.0   Glucose, UA NEGATIVE  NEGATIVE mg/dL   Hgb urine dipstick SMALL (*) NEGATIVE   Bilirubin Urine NEGATIVE  NEGATIVE   Ketones, ur NEGATIVE  NEGATIVE mg/dL   Protein, ur NEGATIVE  NEGATIVE mg/dL   Urobilinogen, UA 0.2  0.0 - 1.0 mg/dL   Nitrite POSITIVE (*) NEGATIVE   Leukocytes, UA MODERATE (*) NEGATIVE  PREGNANCY, URINE      Component Value Range   Preg Test, Ur NEGATIVE  NEGATIVE  URINE MICROSCOPIC-ADD ON      Component Value Range   Squamous Epithelial / LPF RARE  RARE   WBC, UA 21-50  <3 WBC/hpf   RBC / HPF 3-6  <3 RBC/hpf   Bacteria, UA MANY (*) RARE   Laboratory interpretation all normal except UTI     1. Vaginal bleeding   2. Nausea and vomiting   3. Urinary tract infection     New Prescriptions   CEPHALEXIN (KEFLEX) 500 MG  CAPSULE    Take 1 capsule (500 mg total) by mouth 3 (three) times daily.   PHENAZOPYRIDINE (PYRIDIUM) 200 MG TABLET    Take 1 tablet (200 mg total) by mouth 3 (three) times daily.   PROMETHAZINE (PHENERGAN) 25 MG SUPPOSITORY    Place 1 suppository (25 mg total) rectally every 6 (six) hours as needed for nausea.    Plan discharge  Devoria Albe, MD, FACEP   MDM   I personally performed the services described in this documentation, which was scribed in my presence. The recorded information has been reviewed and considered.  Devoria Albe, MD, Armando Gang         Ward Givens, MD 12/27/11 [phone]

## 2011-12-27 NOTE — ED Notes (Signed)
Pt c/o vaginal bleeding and burning with urination. Pt states that she had a menstrual period for 7 days then stopped and had another 2 days later that has lasted for 7 days. Pt c/o feeling weak. Pt crying and very anxious. Family at bedside. Alert and oriented x 3. Skin warm and dry. Color pink.

## 2011-12-28 LAB — RPR: RPR Ser Ql: NONREACTIVE

## 2011-12-28 LAB — GC/CHLAMYDIA PROBE AMP, GENITAL: Chlamydia, DNA Probe: NEGATIVE

## 2012-01-09 ENCOUNTER — Encounter (HOSPITAL_COMMUNITY): Payer: Self-pay | Admitting: Emergency Medicine

## 2012-01-09 ENCOUNTER — Emergency Department (HOSPITAL_COMMUNITY)
Admission: EM | Admit: 2012-01-09 | Discharge: 2012-01-09 | Disposition: A | Discharge status: Home / Self Care | Payer: Self-pay | Attending: Emergency Medicine | Admitting: Emergency Medicine

## 2012-01-09 DIAGNOSIS — R197 Diarrhea, unspecified: Secondary | ICD-10-CM

## 2012-01-09 DIAGNOSIS — K219 Gastro-esophageal reflux disease without esophagitis: Secondary | ICD-10-CM | POA: Insufficient documentation

## 2012-01-09 DIAGNOSIS — R109 Unspecified abdominal pain: Secondary | ICD-10-CM | POA: Insufficient documentation

## 2012-01-09 DIAGNOSIS — F17200 Nicotine dependence, unspecified, uncomplicated: Secondary | ICD-10-CM | POA: Insufficient documentation

## 2012-01-09 LAB — CBC WITH DIFFERENTIAL/PLATELET
Basophils Absolute: 0 10*3/uL (ref 0.0–0.1)
Basophils Relative: 0 % (ref 0–1)
Hemoglobin: 13.5 g/dL (ref 12.0–15.0)
Lymphocytes Relative: 20 % (ref 12–46)
Lymphs Abs: 1.5 10*3/uL (ref 0.7–4.0)
MCH: 33.2 pg (ref 26.0–34.0)
Monocytes Relative: 11 % (ref 3–12)
Neutrophils Relative %: 67 % (ref 43–77)
Platelets: 278 10*3/uL (ref 150–400)
RBC: 4.07 MIL/uL (ref 3.87–5.11)
WBC: 7.5 10*3/uL (ref 4.0–10.5)

## 2012-01-09 LAB — URINALYSIS, ROUTINE W REFLEX MICROSCOPIC
Bilirubin Urine: NEGATIVE
Ketones, ur: NEGATIVE mg/dL
Nitrite: NEGATIVE
Urobilinogen, UA: 0.2 mg/dL (ref 0.0–1.0)

## 2012-01-09 LAB — PREGNANCY, URINE: Preg Test, Ur: NEGATIVE

## 2012-01-09 LAB — BASIC METABOLIC PANEL
CO2: 26 mEq/L (ref 19–32)
Calcium: 8.8 mg/dL (ref 8.4–10.5)
GFR calc non Af Amer: >90 mL/min (ref >90)
Glucose, Bld: 71 mg/dL (ref 70–99)
Potassium: 3.4 mEq/L — ABNORMAL LOW (ref 3.5–5.1)
Sodium: 142 mEq/L (ref 135–145)

## 2012-01-09 MED ORDER — METRONIDAZOLE 500 MG PO TABS
500.0000 mg | ORAL_TABLET | Freq: Once | ORAL | Status: AC
  Administered 2012-01-09: 500 mg via ORAL
  Filled 2012-01-09: qty 1

## 2012-01-09 MED ORDER — HYDROMORPHONE HCL PF 1 MG/ML IJ SOLN
1.0000 mg | Freq: Once | INTRAMUSCULAR | Status: AC
  Administered 2012-01-09: 1 mg via INTRAVENOUS
  Filled 2012-01-09: qty 1

## 2012-01-09 MED ORDER — METRONIDAZOLE 500 MG PO TABS: 500.0000 mg | ORAL_TABLET | Freq: Two times a day (BID) | ORAL | Status: AC

## 2012-01-09 MED ORDER — PROMETHAZINE HCL 25 MG PO TABS: 25.0000 mg | ORAL_TABLET | Freq: Four times a day (QID) | ORAL | Status: DC | PRN

## 2012-01-09 MED ORDER — SODIUM CHLORIDE 0.9 % IV BOLUS (SEPSIS)
1000.0000 mL | Freq: Once | INTRAVENOUS | Status: AC
  Administered 2012-01-09: 1000 mL via INTRAVENOUS

## 2012-01-09 MED ORDER — ONDANSETRON HCL 4 MG/2ML IJ SOLN
4.0000 mg | Freq: Once | INTRAMUSCULAR | Status: AC
  Administered 2012-01-09: 4 mg via INTRAVENOUS
  Filled 2012-01-09: qty 2

## 2012-01-09 MED ORDER — SODIUM CHLORIDE 0.9 % IV SOLN
Freq: Once | INTRAVENOUS | Status: AC
  Administered 2012-01-09: 22:00:00 via INTRAVENOUS

## 2012-01-09 MED ORDER — OXYCODONE-ACETAMINOPHEN 5-325 MG PO TABS: 1.0000 | ORAL_TABLET | ORAL | Status: AC | PRN

## 2012-01-09 NOTE — ED Notes (Signed)
Pt c/o nausea, diarrhea, and "some" vomiting x4 days. Pt states she has been dry heaving past couple days. Pt was tx for uti recently and rx abx.

## 2012-01-09 NOTE — ED Notes (Signed)
Patient states she was here last week and diagnosed with UTI. Now complaining of abdominal pain, severe diarrhea, and nausea x 4 days.

## 2012-01-09 NOTE — ED Notes (Signed)
Patient unable to have a bowel movement to send to lab.

## 2012-01-09 NOTE — ED Provider Notes (Signed)
History   This chart was scribed for Monique Booze, MD by Melba Coon. The patient was seen in room APA10/APA10 and the patient's care was started at 8:42PM.    CSN: [id number]  Arrival date & time 01/09/12  1947   First MD Initiated Contact with Patient 01/09/12 2018      Chief Complaint  Patient presents with  . Abdominal Pain  . Diarrhea    (Consider location/radiation/quality/duration/timing/severity/associated sxs/prior treatment) The history is provided by the patient. No language interpreter was used.   Monique Nguyen is a 41 y.o. female who presents to the Emergency Department complaining of constant, moderate to severe, diffuse, abdominal pain with associated nausea and diarrhea with an onset 4 days ago. Pt was taking Keflex for a previous UTI diagnosed about a week ago. Pt rates th severity of the pain 8/10. Laying flat aggravated the pain; Bowel movements and ASA did not alleviate the pain; nothing alleviated the pain. Diaphoresis present. No HA, fever, neck pain, sore throat, rash, back pain, CP, SOB, emesis, dysuria, or extremity pain, edema, weakness, numbness, or tingling. Hx of GERD. Allergic to sulfa abx. No other pertinent medical symptoms. Current everyday smoker (1 pack a day).  PCP: Dr. Phillips Odor   Past Medical History  Diagnosis Date  . GERD (gastroesophageal reflux disease)   . Interstitial cystitis   . Bacterial vaginosis     Past Surgical History  Procedure Date  . Bladder surgery     X 3    Family History  Problem Relation Age of Onset  . Depression Mother     living  . Pancreatic cancer Father     living    History  Substance Use Topics  . Smoking status: Current Everyday Smoker -- 1.5 packs/day    Types: Cigarettes  . Smokeless tobacco: Not on file  . Alcohol Use: Yes    OB History    Grav Para Term Preterm Abortions TAB SAB Ect Mult Living                  Review of Systems 10 Systems reviewed and all are negative for acute  change except as noted in the HPI.   Allergies  Sulfa antibiotics  Home Medications   Current Outpatient Rx  Name Route Sig Dispense Refill  . CITALOPRAM HYDROBROMIDE 40 MG PO TABS Oral Take 40 mg by mouth daily.    Marland Kitchen CLONAZEPAM 2 MG PO TABS Oral Take 2 mg by mouth 3 (three) times daily as needed.    . IBUPROFEN 200 MG PO CAPS Oral Take 2 capsules by mouth as needed.    Marland Kitchen PROMETHAZINE HCL 25 MG RE SUPP Rectal Place 1 suppository (25 mg total) rectally every 6 (six) hours as needed for nausea. 8 each 0    BP 101/73  Pulse 84  Temp 98.1 F (36.7 C) (Oral)  Resp 16  Ht 5\' 1"  (1.549 m)  Wt 116 lb (52.617 kg)  BMI 21.92 kg/m2  SpO2 100%  LMP 12/27/2011  Physical Exam  Nursing note and vitals reviewed. Constitutional: She is oriented to person, place, and time. She appears well-developed and well-nourished. No distress.  HENT:  Head: Normocephalic and atraumatic.  Right Ear: External ear normal.  Left Ear: External ear normal.  Eyes: EOM are normal.  Neck: Normal range of motion. No tracheal deviation present.  Cardiovascular: Normal rate, regular rhythm and normal heart sounds.   No murmur heard. Pulmonary/Chest: Effort normal and breath sounds normal. No respiratory  distress.  Abdominal: Soft. There is tenderness (Mild diffuse tenderness). There is no rebound and no guarding.  Musculoskeletal: Normal range of motion. She exhibits no edema and no tenderness.  Neurological: She is alert and oriented to person, place, and time.  Skin: Skin is warm and dry. No rash noted.  Psychiatric: She has a normal mood and affect. Her behavior is normal.    ED Course  Procedures (including critical care time)  DIAGNOSTIC STUDIES: Oxygen Saturation is 100% on room air, normal by my interpretation.    COORDINATION OF CARE:  8:48PM - EDMD will order dilaudid, Zofran, IV fluids, c diff test, blood w/u, and UA for the pt.  Results for orders placed during the hospital encounter of  01/09/12  BASIC METABOLIC PANEL      Component Value Range   Sodium 142  135 - 145 mEq/L   Potassium 3.4 (*) 3.5 - 5.1 mEq/L   Chloride 106  96 - 112 mEq/L   CO2 26  19 - 32 mEq/L   Glucose, Bld 71  70 - 99 mg/dL   BUN 3 (*) 6 - 23 mg/dL   Creatinine, Ser 4.09 (*) 0.50 - 1.10 mg/dL   Calcium 8.8  8.4 - 81.1 mg/dL   GFR calc non Af Amer >90  >90 mL/min   GFR calc Af Amer >90  >90 mL/min  CBC WITH DIFFERENTIAL      Component Value Range   WBC 7.5  4.0 - 10.5 K/uL   RBC 4.07  3.87 - 5.11 MIL/uL   Hemoglobin 13.5  12.0 - 15.0 g/dL   HCT 91.4  78.2 - 95.6 %   MCV 95.6  78.0 - 100.0 fL   MCH 33.2  26.0 - 34.0 pg   MCHC 34.7  30.0 - 36.0 g/dL   RDW 21.3  08.6 - 57.8 %   Platelets 278  150 - 400 K/uL   Neutrophils Relative 67  43 - 77 %   Neutro Abs 5.0  1.7 - 7.7 K/uL   Lymphocytes Relative 20  12 - 46 %   Lymphs Abs 1.5  0.7 - 4.0 K/uL   Monocytes Relative 11  3 - 12 %   Monocytes Absolute 0.8  0.1 - 1.0 K/uL   Eosinophils Relative 2  0 - 5 %   Eosinophils Absolute 0.2  0.0 - 0.7 K/uL   Basophils Relative 0  0 - 1 %   Basophils Absolute 0.0  0.0 - 0.1 K/uL  URINALYSIS, ROUTINE W REFLEX MICROSCOPIC      Component Value Range   Color, Urine STRAW (*) YELLOW   APPearance CLEAR  CLEAR   Specific Gravity, Urine <1.005 (*) 1.005 - 1.030   pH 6.0  5.0 - 8.0   Glucose, UA NEGATIVE  NEGATIVE mg/dL   Hgb urine dipstick SMALL (*) NEGATIVE   Bilirubin Urine NEGATIVE  NEGATIVE   Ketones, ur NEGATIVE  NEGATIVE mg/dL   Protein, ur NEGATIVE  NEGATIVE mg/dL   Urobilinogen, UA 0.2  0.0 - 1.0 mg/dL   Nitrite NEGATIVE  NEGATIVE   Leukocytes, UA NEGATIVE  NEGATIVE  URINE MICROSCOPIC-ADD ON      Component Value Range   Squamous Epithelial / LPF FEW (*) RARE   WBC, UA 3-6  <3 WBC/hpf   RBC / HPF 0-2  <3 RBC/hpf   Bacteria, UA FEW (*) RARE   Urine-Other YEAST    PREGNANCY, URINE      Component Value Range   Preg  Test, Ur NEGATIVE  NEGATIVE    1. Diarrhea       MDM  Prior ED  records are reviewed, and it is noted that she was treated for a UTI with cephalexin. Diarrhea which seems most likely to be due to Clostridium difficile, given her recent course of cephalexin.  She was given IV hydration and IV hydromorphone and ondansetron and feels much better. She has not been able to give a stool specimen for definitive diagnosis of Clostridium difficile, so she will be treated empirically with metronidazole. She is also given prescriptions for promethazine and Percocet. She is to followup with her PCP later this week.  I personally performed the services described in this documentation, which was scribed in my presence. The recorded information has been reviewed and considered.  Monique Booze, MD 01/09/12 [phone]

## 2012-01-24 ENCOUNTER — Encounter (HOSPITAL_COMMUNITY): Payer: Self-pay

## 2012-01-24 ENCOUNTER — Emergency Department (HOSPITAL_COMMUNITY)
Admission: EM | Admit: 2012-01-24 | Discharge: 2012-01-24 | Disposition: A | Discharge status: Home / Self Care | Payer: Self-pay | Attending: Emergency Medicine | Admitting: Emergency Medicine

## 2012-01-24 DIAGNOSIS — F4321 Adjustment disorder with depressed mood: Secondary | ICD-10-CM | POA: Insufficient documentation

## 2012-01-24 DIAGNOSIS — F410 Panic disorder [episodic paroxysmal anxiety] without agoraphobia: Secondary | ICD-10-CM | POA: Insufficient documentation

## 2012-01-24 DIAGNOSIS — F17200 Nicotine dependence, unspecified, uncomplicated: Secondary | ICD-10-CM | POA: Insufficient documentation

## 2012-01-24 DIAGNOSIS — Z79899 Other long term (current) drug therapy: Secondary | ICD-10-CM | POA: Insufficient documentation

## 2012-01-24 DIAGNOSIS — F419 Anxiety disorder, unspecified: Secondary | ICD-10-CM

## 2012-01-24 HISTORY — DX: Anxiety disorder, unspecified: F41.9

## 2012-01-24 HISTORY — DX: Depression, unspecified: F32.A

## 2012-01-24 HISTORY — DX: Panic disorder (episodic paroxysmal anxiety): F41.0

## 2012-01-24 HISTORY — DX: Major depressive disorder, single episode, unspecified: F32.9

## 2012-01-24 LAB — POCT I-STAT, CHEM 8
BUN: 4 mg/dL — ABNORMAL LOW (ref 6–23)
Chloride: 105 mEq/L (ref 96–112)
Sodium: 141 mEq/L (ref 135–145)
TCO2: 22 mmol/L (ref 0–100)

## 2012-01-24 MED ORDER — LORAZEPAM 1 MG PO TABS
1.0000 mg | ORAL_TABLET | Freq: Once | ORAL | Status: AC
  Administered 2012-01-24: 1 mg via ORAL
  Filled 2012-01-24: qty 1

## 2012-01-24 MED ORDER — PROMETHAZINE HCL 25 MG PO TABS: 25.0000 mg | ORAL_TABLET | Freq: Four times a day (QID) | ORAL | Status: DC | PRN

## 2012-01-24 MED ORDER — ONDANSETRON 8 MG PO TBDP
8.0000 mg | ORAL_TABLET | Freq: Once | ORAL | Status: AC
  Administered 2012-01-24: 8 mg via ORAL
  Filled 2012-01-24: qty 1

## 2012-01-24 NOTE — ED Notes (Signed)
Pt reports has had a lot of anxiety since SUnday  And has been having chest pain.  Says feel like chest is caving in.  Pt says has had 2 panic attacks today.  Reports doesn't feel like her anxiety and depression medication is working.  Denies SI or HI.

## 2012-01-24 NOTE — ED Provider Notes (Signed)
History   Scribed for Laray Anger, DO, the patient was seen in room APA09/APA09 . This chart was scribed by Lewanda Rife.   CSN: [id number]  Arrival date & time 01/24/12  4098   First MD Initiated Contact with Patient 01/24/12 1832      Chief Complaint  Patient presents with  . Panic Attack  . Chest Pain     HPI Pt was seen at 1840. Monique Nguyen is a 41 y.o. female who presents to the Emergency Department complaining of gradual onset and persistence of constant anxiety for the past 2-3 days.  Describes her anxiety as per her longstanding usual symptom pattern:  feeling nauseated, chest "tightness" and crying/tearful.  Pt states she had 2 episodes of "panic attacks."  Pt states her symptoms began the day after she attended 2 days of funeral services for her significant other's father unexpected death.  Pt has a hx of depression and anxiety; has been taking her usual meds without relief. Denies SI, no HI. Denies palpitations, no SOB/cough, no abd pain, no vomiting/diarrhea.     PMD:  Robbie Lis [facility] Past Medical History  Diagnosis Date  . GERD (gastroesophageal reflux disease)   . Interstitial cystitis   . Bacterial vaginosis   . Anxiety   . Depression   . Panic attacks     Past Surgical History  Procedure Date  . Bladder surgery     X 3    Family History  Problem Relation Age of Onset  . Depression Mother     living  . Pancreatic cancer Father     living    History  Substance Use Topics  . Smoking status: Current Everyday Smoker -- 1.5 packs/day    Types: Cigarettes  . Smokeless tobacco: Not on file  . Alcohol Use: Yes     occ      Review of Systems ROS: Statement: All systems negative except as marked or noted in the HPI; Constitutional: Negative for fever and chills. ; ; Eyes: Negative for eye pain, redness and discharge. ; ; ENMT: Negative for ear pain, hoarseness, nasal congestion, sinus pressure and sore throat. ; ; Cardiovascular: Negative  for chest pain, palpitations, diaphoresis, dyspnea and peripheral edema. ; ; Respiratory: Negative for cough, wheezing and stridor. ; ; Gastrointestinal: +nausea. Negative for vomiting, diarrhea, abdominal pain, blood in stool, hematemesis, jaundice and rectal bleeding. . ; ; Genitourinary: Negative for dysuria, flank pain and hematuria. ; ; Musculoskeletal: Negative for back pain and neck pain. Negative for swelling and trauma.; ; Skin: Negative for pruritus, rash, abrasions, blisters, bruising and skin lesion.; ; Neuro: Negative for headache, lightheadedness and neck stiffness. Negative for weakness, altered level of consciousness , altered mental status, extremity weakness, paresthesias, involuntary movement, seizure and syncope.; Psych:  +anxious, panic attacks. No SI, no SA, no HI, no hallucinations.      Allergies  Sulfa antibiotics  Home Medications   Current Outpatient Rx  Name Route Sig Dispense Refill  . CITALOPRAM HYDROBROMIDE 40 MG PO TABS Oral Take 40 mg by mouth daily.    Marland Kitchen CLONAZEPAM 2 MG PO TABS Oral Take 2 mg by mouth 3 (three) times daily as needed.    . IBUPROFEN 200 MG PO CAPS Oral Take 2 capsules by mouth as needed.    Marland Kitchen PROMETHAZINE HCL 25 MG RE SUPP Rectal Place 1 suppository (25 mg total) rectally every 6 (six) hours as needed for nausea. 8 each 0  . PROMETHAZINE HCL 25 MG  PO TABS Oral Take 25-50 mg by mouth as needed.    Marland Kitchen PROMETHAZINE HCL 25 MG PO TABS Oral Take 1 tablet (25 mg total) by mouth every 6 (six) hours as needed for nausea. 30 tablet 0    BP 113/74  Pulse 91  Temp 98.2 F (36.8 C) (Oral)  Resp 22  Ht 5\' 1"  (1.549 m)  Wt 116 lb (52.617 kg)  BMI 21.92 kg/m2  SpO2 99%  LMP 12/27/2011  Physical Exam 1845: Physical examination:  Nursing notes reviewed; Vital signs and O2 SAT reviewed;  Constitutional: Well developed, Well nourished, Well hydrated, In no acute distress; Head:  Normocephalic, atraumatic; Eyes: EOMI, PERRL, No scleral icterus; ENMT: Mouth  and pharynx normal, Mucous membranes moist; Neck: Supple, Full range of motion, No lymphadenopathy; Cardiovascular: Regular rate and rhythm, No murmur, rub, or gallop; Respiratory: Breath sounds clear & equal bilaterally, No rales, rhonchi, wheezes.  Speaking full sentences with ease, Normal respiratory effort/excursion; Chest: Nontender, Movement normal; Abdomen: Soft, Nontender, Nondistended, Normal bowel sounds;; Extremities: Pulses normal, No tenderness, No edema, No calf edema or asymmetry.; Neuro: AA&Ox3, Major CN grossly intact.  Speech clear. No gross focal motor or sensory deficits in extremities.; Skin: Color normal, Warm, Dry.; Psych:  Anxious, tearful, no SI.      ED Course  Procedures   MDM  MDM Reviewed: nursing note and vitals Reviewed previous: ECG Interpretation: ECG    Date: 01/24/2012  Rate: 83  Rhythm: normal sinus rhythm  QRS Axis: normal  Intervals: QT prolonged, QTc 481  ST/T Wave abnormalities: normal  Conduction Disutrbances:none  Narrative Interpretation:   Old EKG Reviewed: unchanged; no significant changes from previous EKG dated 01/25/2011, QTc 461.  Results for orders placed during the hospital encounter of 01/24/12  POCT I-STAT, CHEM 8      Component Value Range   Sodium 141  135 - 145 mEq/L   Potassium 3.5  3.5 - 5.1 mEq/L   Chloride 105  96 - 112 mEq/L   BUN 4 (*) 6 - 23 mg/dL   Creatinine, Ser 1.61  0.50 - 1.10 mg/dL   Glucose, Bld 92  70 - 99 mg/dL   Calcium, Ion 0.96  0.45 - 1.23 mmol/L   TCO2 22  0 - 100 mmol/L   Hemoglobin 14.3  12.0 - 15.0 g/dL   HCT 40.9  81.1 - 91.4 %     2025:  No SI.  No acute need for inpt services at this time.  Outpt psych referrals given.  Has tol PO well while in the ED without N/V.  Wants to go home now.  Dx testing d/w pt and family.  Questions answered.  Verb understanding, agreeable to d/c home with outpt f/u.       I personally performed the services described in this documentation, which was scribed in  my presence. The recorded information has been reviewed and considered. Yoshiko Keleher Allison Quarry, DO 01/25/12 1814

## 2012-01-31 ENCOUNTER — Encounter (HOSPITAL_COMMUNITY): Payer: Self-pay | Admitting: Emergency Medicine

## 2012-01-31 ENCOUNTER — Emergency Department (HOSPITAL_COMMUNITY)
Admission: EM | Admit: 2012-01-31 | Discharge: 2012-01-31 | Disposition: A | Discharge status: Home / Self Care | Payer: Self-pay | Attending: Emergency Medicine | Admitting: Emergency Medicine

## 2012-01-31 DIAGNOSIS — F3289 Other specified depressive episodes: Secondary | ICD-10-CM | POA: Insufficient documentation

## 2012-01-31 DIAGNOSIS — F411 Generalized anxiety disorder: Secondary | ICD-10-CM | POA: Insufficient documentation

## 2012-01-31 DIAGNOSIS — T50901A Poisoning by unspecified drugs, medicaments and biological substances, accidental (unintentional), initial encounter: Secondary | ICD-10-CM | POA: Insufficient documentation

## 2012-01-31 DIAGNOSIS — T50904A Poisoning by unspecified drugs, medicaments and biological substances, undetermined, initial encounter: Secondary | ICD-10-CM | POA: Insufficient documentation

## 2012-01-31 DIAGNOSIS — F329 Major depressive disorder, single episode, unspecified: Secondary | ICD-10-CM | POA: Insufficient documentation

## 2012-01-31 LAB — URINALYSIS, ROUTINE W REFLEX MICROSCOPIC
Leukocytes, UA: NEGATIVE
Specific Gravity, Urine: <1.005 — ABNORMAL LOW (ref 1.005–1.030)
Urobilinogen, UA: 0.2 mg/dL (ref 0.0–1.0)

## 2012-01-31 LAB — RAPID URINE DRUG SCREEN, HOSP PERFORMED
Cocaine: NOT DETECTED
Opiates: NOT DETECTED
Tetrahydrocannabinol: NOT DETECTED

## 2012-01-31 LAB — BASIC METABOLIC PANEL
BUN: 3 mg/dL — ABNORMAL LOW (ref 6–23)
Chloride: 98 mEq/L (ref 96–112)
GFR calc Af Amer: >90 mL/min (ref >90)
Glucose, Bld: 111 mg/dL — ABNORMAL HIGH (ref 70–99)
Potassium: 3.3 mEq/L — ABNORMAL LOW (ref 3.5–5.1)

## 2012-01-31 LAB — SALICYLATE LEVEL: Salicylate Lvl: <2 mg/dL — ABNORMAL LOW (ref 2.8–20.0)

## 2012-01-31 LAB — CBC
HCT: 38.3 % (ref 36.0–46.0)
Hemoglobin: 13.6 g/dL (ref 12.0–15.0)
WBC: 7.1 10*3/uL (ref 4.0–10.5)

## 2012-01-31 LAB — ACETAMINOPHEN LEVEL: Acetaminophen (Tylenol), Serum: <15 ug/mL (ref 10–30)

## 2012-01-31 LAB — URINE MICROSCOPIC-ADD ON

## 2012-01-31 MED ORDER — LORAZEPAM 1 MG PO TABS: 1.0000 mg | ORAL_TABLET | Freq: Three times a day (TID) | ORAL | Status: DC | PRN

## 2012-01-31 MED ORDER — ONDANSETRON HCL 4 MG PO TABS: 4.0000 mg | ORAL_TABLET | Freq: Three times a day (TID) | ORAL | Status: DC | PRN

## 2012-01-31 MED ORDER — ACTIDOSE WITH SORBITOL 50 GM/240ML PO LIQD
50.0000 g | Freq: Once | ORAL | Status: AC
  Administered 2012-01-31: 50 g via ORAL

## 2012-01-31 MED ORDER — ZOLPIDEM TARTRATE 5 MG PO TABS: 5.0000 mg | ORAL_TABLET | Freq: Every evening | ORAL | Status: DC | PRN

## 2012-01-31 MED ORDER — ACTIDOSE WITH SORBITOL 50 GM/240ML PO LIQD
ORAL | Status: AC
  Filled 2012-01-31: qty 240

## 2012-01-31 MED ORDER — NICOTINE 21 MG/24HR TD PT24: 21.0000 mg | MEDICATED_PATCH | Freq: Every day | TRANSDERMAL | Status: DC

## 2012-01-31 MED ORDER — CITALOPRAM HYDROBROMIDE 20 MG PO TABS
40.0000 mg | ORAL_TABLET | Freq: Every day | ORAL | Status: DC
  Filled 2012-01-31 (×2): qty 2

## 2012-01-31 MED ORDER — ACETAMINOPHEN 325 MG PO TABS: 650.0000 mg | ORAL_TABLET | ORAL | Status: DC | PRN

## 2012-01-31 NOTE — ED Notes (Signed)
Patient is resting comfortably. 

## 2012-01-31 NOTE — ED Notes (Signed)
Family at bedside. 

## 2012-01-31 NOTE — ED Notes (Signed)
Pt awake, meal tray given.

## 2012-01-31 NOTE — ED Notes (Signed)
Pt to be d/c home. Labs added to blood already drawn. Pt ambulated to restroom without difficulty with assistance.

## 2012-01-31 NOTE — ED Provider Notes (Signed)
Patient has been seen and evaluated by ACT team, Monique Nguyen. Patient is awake, alert, oriented and clinically sober.  She denies SI and HI and states she was not trying to kill herself.   CN 2-12 intact, 5/5 strength throughout, no asterixis, equal grip strengths, no clonus.  BP 97/56  Pulse 96  Temp 98.1 F (36.7 C) (Oral)  Resp 18  Ht 5\' 3"  (1.6 m)  Wt 120 lb (54.432 kg)  BMI 21.26 kg/m2  SpO2 95%  She has contracted for safety and has followup with [facility] on 8/22.  Glynn Octave, MD 01/31/12 1018

## 2012-01-31 NOTE — ED Provider Notes (Signed)
History     CSN: [id number]  Arrival date & time 01/31/12  0128   First MD Initiated Contact with Patient 01/31/12 0154      Chief Complaint  Patient presents with  . Drug Overdose  . V70.1    (Consider location/radiation/quality/duration/timing/severity/associated sxs/prior treatment) HPI  KAYIA BILLINGER is a 41 y.o. female with a h/o anxiety and depression who presents to the Emergency Department complaining of drug overdose in an attempt to "go to sleep and not wake up". She states she has been drinking beer, took #10 celexa 40 mg, #30, klonopin 2 mg. She is grieving over the sudden death of her boyfriend's father recently.   PCP Dr. Regino Schultze    Past Medical History  Diagnosis Date  . GERD (gastroesophageal reflux disease)   . Interstitial cystitis   . Bacterial vaginosis   . Anxiety   . Depression   . Panic attacks     Past Surgical History  Procedure Date  . Bladder surgery     X 3    Family History  Problem Relation Age of Onset  . Depression Mother     living  . Pancreatic cancer Father     living    History  Substance Use Topics  . Smoking status: Current Everyday Smoker -- 1.5 packs/day    Types: Cigarettes  . Smokeless tobacco: Not on file  . Alcohol Use: Yes     occ    OB History    Grav Para Term Preterm Abortions TAB SAB Ect Mult Living                  Review of Systems  Constitutional: Negative for fever.       10 Systems reviewed and are negative for acute change except as noted in the HPI.  HENT: Negative for congestion.   Eyes: Negative for discharge and redness.  Respiratory: Negative for cough and shortness of breath.   Cardiovascular: Negative for chest pain.  Gastrointestinal: Negative for vomiting and abdominal pain.  Musculoskeletal: Negative for back pain.  Skin: Negative for rash.  Neurological: Negative for syncope, numbness and headaches.  Psychiatric/Behavioral: Positive for suicidal ideas.       Anxiety     Allergies  Sulfa antibiotics  Home Medications   Current Outpatient Rx  Name Route Sig Dispense Refill  . CITALOPRAM HYDROBROMIDE 40 MG PO TABS Oral Take 40 mg by mouth daily.    Marland Kitchen CLONAZEPAM 2 MG PO TABS Oral Take 2 mg by mouth 3 (three) times daily as needed.    . IBUPROFEN 200 MG PO CAPS Oral Take 2 capsules by mouth as needed.    Marland Kitchen PROMETHAZINE HCL 25 MG PO TABS Oral Take 1 tablet (25 mg total) by mouth every 6 (six) hours as needed for nausea. 8 tablet 0    BP 107/72  Pulse 84  Temp 98.1 F (36.7 C) (Oral)  Resp 12  Ht 5\' 3"  (1.6 m)  Wt 120 lb (54.432 kg)  BMI 21.26 kg/m2  SpO2 97%  Physical Exam  Nursing note and vitals reviewed. Constitutional:       Awake, alert, nontoxic appearance, tearful  HENT:  Head: Atraumatic.  Eyes: Right eye exhibits no discharge. Left eye exhibits no discharge.  Neck: Neck supple.  Pulmonary/Chest: Effort normal. She exhibits no tenderness.  Abdominal: Soft. There is no tenderness. There is no rebound.  Musculoskeletal: She exhibits no tenderness.       Baseline ROM, no obvious  new focal weakness.  Neurological:       Mental status and motor strength appears baseline for patient and situation.  Skin: No rash noted.  Psychiatric:       Endorses suicidal ideation    ED Course  Procedures (including critical care time) Results for orders placed during the hospital encounter of 01/31/12  CBC      Component Value Range   WBC 7.1  4.0 - 10.5 K/uL   RBC 4.02  3.87 - 5.11 MIL/uL   Hemoglobin 13.6  12.0 - 15.0 g/dL   HCT 16.1  09.6 - 04.5 %   MCV 95.3  78.0 - 100.0 fL   MCH 33.8  26.0 - 34.0 pg   MCHC 35.5  30.0 - 36.0 g/dL   RDW 40.9  81.1 - 91.4 %   Platelets 307  150 - 400 K/uL  BASIC METABOLIC PANEL      Component Value Range   Sodium 133 (*) 135 - 145 mEq/L   Potassium 3.3 (*) 3.5 - 5.1 mEq/L   Chloride 98  96 - 112 mEq/L   CO2 24  19 - 32 mEq/L   Glucose, Bld 111 (*) 70 - 99 mg/dL   BUN 3 (*) 6 - 23 mg/dL    Creatinine, Ser 7.82 (*) 0.50 - 1.10 mg/dL   Calcium 8.7  8.4 - 95.6 mg/dL   GFR calc non Af Amer >90  >90 mL/min   GFR calc Af Amer >90  >90 mL/min  ETHANOL      Component Value Range   Alcohol, Ethyl (B) 177 (*) 0 - 11 mg/dL  URINALYSIS, ROUTINE W REFLEX MICROSCOPIC      Component Value Range   Color, Urine STRAW (*) YELLOW   APPearance CLEAR  CLEAR   Specific Gravity, Urine <1.005 (*) 1.005 - 1.030   pH 5.5  5.0 - 8.0   Glucose, UA NEGATIVE  NEGATIVE mg/dL   Hgb urine dipstick TRACE (*) NEGATIVE   Bilirubin Urine NEGATIVE  NEGATIVE   Ketones, ur NEGATIVE  NEGATIVE mg/dL   Protein, ur NEGATIVE  NEGATIVE mg/dL   Urobilinogen, UA 0.2  0.0 - 1.0 mg/dL   Nitrite NEGATIVE  NEGATIVE   Leukocytes, UA NEGATIVE  NEGATIVE  URINE RAPID DRUG SCREEN (HOSP PERFORMED)      Component Value Range   Opiates NONE DETECTED  NONE DETECTED   Cocaine NONE DETECTED  NONE DETECTED   Benzodiazepines NONE DETECTED  NONE DETECTED   Amphetamines NONE DETECTED  NONE DETECTED   Tetrahydrocannabinol NONE DETECTED  NONE DETECTED   Barbiturates NONE DETECTED  NONE DETECTED  TROPONIN I      Component Value Range   Troponin I <0.30  <0.30 ng/mL  URINE MICROSCOPIC-ADD ON      Component Value Range   Squamous Epithelial / LPF RARE  RARE   Bacteria, UA RARE  RARE     Date: 01/31/2012  0144  Rate:97  Rhythm: normal sinus rhythm  QRS Axis: normal  Intervals: normal  ST/T Wave abnormalities: normal  Conduction Disutrbances: none  Narrative Interpretation: unremarkable      MDM  Patient with a h/o depression and anxiety here having taken celexa, klonopin and drinking. UDS is negative for benzodiazepines. While she initially endorsed suicidal ideation, she now denies intent. With the inconsistencies, will have her evaluated by ACT. UDS negative for benzodiazepines which is contrary to her presenting story. She was given charcoal. She has remained stable. Medically cleared for evaluation by  ACT.  MDM Reviewed: nursing note, vitals and previous chart Interpretation: labs and ECG           Nicoletta Dress. Colon Branch, MD 01/31/12 1610

## 2012-01-31 NOTE — ED Notes (Signed)
Monique Nguyen from act team here to see pt.

## 2012-01-31 NOTE — BH Assessment (Signed)
Assessment Note   Monique Nguyen is an 41 y.o. female. The patient came to the ED last night after she had been drinking to excess. She says she took Celexa and Klonopin after she could not go to sleep. At that  time she said she had taken 30 Klonopin but her drug screen was negative for benzos. This am she denies any SI and denies any previous attempts.  She is not homicidal and has no history of violence. She is not hallucinated nor is she delusional. Patient has no history of treatment other than her PCP. Her husband died of brain cancer in September 07, 2009. She is still grieving this loss. Patient is willing to contract for safety and arrange outpatient follow up. Her daughter and significant other agree to supervise patient for the time being if she is released to go home. Patient status given to Dr Manus Gunning.  Axis I:  Major Depressive Disorder, recurrent, sever,without psychotic features;Alcohol Abuse Axis II: Deferred Axis III:  Past Medical History  Diagnosis Date  . GERD (gastroesophageal reflux disease)   . Interstitial cystitis   . Bacterial vaginosis   . Anxiety   . Depression   . Panic attacks    Axis IV: economic problems, occupational problems and problems with access to health care services Axis V: 41-50 serious symptoms  Past Medical History:  Past Medical History  Diagnosis Date  . GERD (gastroesophageal reflux disease)   . Interstitial cystitis   . Bacterial vaginosis   . Anxiety   . Depression   . Panic attacks     Past Surgical History  Procedure Date  . Bladder surgery     X 3    Family History:  Family History  Problem Relation Age of Onset  . Depression Mother     living  . Pancreatic cancer Father     living    Social History:  reports that she has been smoking Cigarettes.  She has been smoking about 1.5 packs per day. She does not have any smokeless tobacco history on file. She reports that she drinks alcohol. She reports that she uses illicit  drugs (Marijuana).  Additional Social History:     CIWA: CIWA-Ar BP: 97/56 mmHg Pulse Rate: 96  COWS:    Allergies:  Allergies  Allergen Reactions  . Sulfa Antibiotics Nausea And Vomiting and Other (See Comments)    Blurred vision and headaches    Home Medications:  (Not in a hospital admission)  OB/GYN Status:  No LMP recorded.  General Assessment Data Location of Assessment: [facility] Assessment: Yes Living Arrangements: Spouse/significant other;Children (son and daughter) Can pt return to current living arrangement?: Yes Admission Status: Voluntary Is patient capable of signing voluntary admission?: Yes Transfer from: Acute Hospital Referral Source: MD  Education Status Is patient currently in school?: No  Risk to self Suicidal Ideation: No-Not Currently/Within Last 6 Months Suicidal Intent: No-Not Currently/Within Last 6 Months Is patient at risk for suicide?: No Suicidal Plan?: No Access to Means: No What has been your use of drugs/alcohol within the last 12 months?: daily etoh Previous Attempts/Gestures: No How many times?: 0  Other Self Harm Risks: denies Triggers for Past Attempts: None known;Hallucinations;Other (Comment) (grief;depression) Intentional Self Injurious Behavior: None Family Suicide History: No Recent stressful life event(s): Loss (Comment) (spouse died 21-Aug-2022) Persecutory voices/beliefs?: No Depression: Yes Depression Symptoms: Insomnia;Tearfulness;Isolating;Loss of interest in usual pleasures Substance abuse history and/or treatment for substance abuse?: Yes Suicide prevention information given to non-admitted patients:  Yes  Risk to Others Homicidal Ideation: No Thoughts of Harm to Others: No Current Homicidal Intent: No Current Homicidal Plan: No Access to Homicidal Means: No History of harm to others?: No Assessment of Violence: None Noted Does patient have access to weapons?: No Criminal Charges Pending?: No Does patient have a  court date: No  Psychosis Hallucinations: None noted Delusions: None noted  Mental Status Report Appear/Hygiene: Disheveled Eye Contact: Fair Motor Activity: Freedom of movement;Restlessness Speech: Logical/coherent;Soft Level of Consciousness: Alert;Drowsy Mood: Depressed;Anhedonia Affect: Depressed;Appropriate to circumstance Anxiety Level: Minimal Thought Processes: Coherent;Relevant Judgement: Unimpaired Orientation: Person;Place;Time Obsessive Compulsive Thoughts/Behaviors: None  Cognitive Functioning Concentration: Decreased Memory: Recent Intact;Remote Intact IQ: Average Insight: Fair Impulse Control: Fair Appetite: Fair Sleep: Decreased Total Hours of Sleep: 4  Vegetative Symptoms: None  ADLScreening [facility]) Patient's cognitive ability adequate to safely complete daily activities?: Yes Patient able to express need for assistance with ADLs?: Yes Independently performs ADLs?: Yes (appropriate for developmental age)  Abuse/Neglect [facility]) Physical Abuse: Denies Verbal Abuse: Denies Sexual Abuse: Denies  Prior Inpatient Therapy Prior Inpatient Therapy: No  Prior Outpatient Therapy Prior Outpatient Therapy: No  ADL Screening (condition at time of admission) Patient's cognitive ability adequate to safely complete daily activities?: Yes Patient able to express need for assistance with ADLs?: Yes Independently performs ADLs?: Yes (appropriate for developmental age)       Abuse/Neglect Assessment (Assessment to be complete while patient is alone) Physical Abuse: Denies Verbal Abuse: Denies Sexual Abuse: Denies Values / Beliefs Cultural Requests During Hospitalization: None Spiritual Requests During Hospitalization: None        Additional Information 1:1 In Past 12 Months?: No CIRT Risk: No Elopement Risk: No Does patient have medical clearance?: Yes     Disposition:  Patient does not meet IVC criteria nor does she meet inpatient  treatment criteria. Patient will be seen on Emergency Services at [facility] 02/02/12 @ 8:00am. Patient signed No Harm Contract. Dr Manus Gunning is in agreement with this disposition. Disposition Disposition of Patient: Outpatient treatment;Referred to Patient referred to: Other (Comment) ([facility])  On Site Evaluation by:   Reviewed with Physician:     Jearld Pies 01/31/2012 9:50 AM

## 2012-01-31 NOTE — ED Notes (Signed)
Monique Nguyen from ACT is currently at bedside.

## 2012-01-31 NOTE — ED Notes (Addendum)
PIV in Rt ac removed. Pt tolerated well. Site remains soft, pink and without s/s of infiltration. Catheter intact upon removal. NS fluid dc'd also at this time. Approx 300 ml's remained in bag. Pt tolerated well. Pt's personal belongings given to pt. Pt removed paper scrubs and dressed self in street clothes.

## 2012-01-31 NOTE — ED Notes (Addendum)
Patient reports taking 10 40 mg celexas, about 30 2 mg klonopin, and states has been drinking a lot of beer. Patient reports overdosing to try to kill herself. States she wanted to go to sleep and not wake up. Patient asking for [place] to just let her die.

## 2012-02-02 ENCOUNTER — Emergency Department (HOSPITAL_COMMUNITY)
Admission: EM | Admit: 2012-02-02 | Discharge: 2012-02-02 | Discharge status: Against Medical Advice | Payer: Self-pay | Attending: Emergency Medicine | Admitting: Emergency Medicine

## 2012-02-02 ENCOUNTER — Encounter (HOSPITAL_COMMUNITY): Payer: Self-pay | Admitting: Emergency Medicine

## 2012-02-02 ENCOUNTER — Other Ambulatory Visit: Payer: Self-pay

## 2012-02-02 DIAGNOSIS — R0789 Other chest pain: Secondary | ICD-10-CM | POA: Insufficient documentation

## 2012-02-02 DIAGNOSIS — F411 Generalized anxiety disorder: Secondary | ICD-10-CM | POA: Insufficient documentation

## 2012-02-02 NOTE — ED Notes (Signed)
Patient lying in bed sleeping. States "my husband died 2 years ago and my boyfriend's father died 2 weeks ago and I think it brought back old memories going to the funeral." Patient states her "chest feels like it's caving in." Denies SI/HI. States "I am on medication for anxiety but it's not working." NT obtaining EKG to rule out cardiac.

## 2012-02-02 NOTE — ED Notes (Signed)
Patient states she wants to leave. States "I think I just got myself worked up. I feel better and want to go home." Patient denies SI / HI. Advised MD. Patient is voluntary so she is allowed to leave.

## 2012-02-02 NOTE — ED Notes (Signed)
Patient c/o feeling jittery and "not right" for a little over a week.  Patient unable to describe exactly how she feels; states she doesn't feel right inside.  Patient denies SI/HI.

## 2012-02-08 ENCOUNTER — Encounter (HOSPITAL_COMMUNITY): Payer: Self-pay

## 2012-02-08 ENCOUNTER — Emergency Department (HOSPITAL_COMMUNITY)
Admission: EM | Admit: 2012-02-08 | Discharge: 2012-02-08 | Discharge status: Against Medical Advice | Payer: Self-pay | Attending: Emergency Medicine | Admitting: Emergency Medicine

## 2012-02-08 DIAGNOSIS — R079 Chest pain, unspecified: Secondary | ICD-10-CM | POA: Insufficient documentation

## 2012-02-08 DIAGNOSIS — F3289 Other specified depressive episodes: Secondary | ICD-10-CM | POA: Insufficient documentation

## 2012-02-08 DIAGNOSIS — F329 Major depressive disorder, single episode, unspecified: Secondary | ICD-10-CM | POA: Insufficient documentation

## 2012-02-08 MED ORDER — LORAZEPAM 2 MG/ML IJ SOLN
INTRAMUSCULAR | Status: AC
  Filled 2012-02-08: qty 1

## 2012-02-08 NOTE — ED Notes (Signed)
Pt received on stretcher, lying on her rt side Reported pt was having a panic attack, pt was shaking and wiggling hips. Significant other at bedside stating pt has panic attacks often. Pt's top removed, placed in Hospital gown and placed on cardiac monitoring. RN talked to pt as pt was staring out in space, pt is known for her su do seizure and panic attacks at the ED. Pt stopped and was interacting with the RN once RN began speaking to friend in regards of keeping her as a psych pt. Pt quickly began talking to RN without eye contact. Pt states sleeps til 2 pm as she doesn't want to get out of bed, medications don't work for her depression. Pt denies SI/HI. States just wants to sleep. Pt continued to talk about her deceased husband, and a recent death in the family. Pt encouraged to stay how ever left AMA.

## 2012-02-08 NOTE — ED Notes (Signed)
Pt reports has history of panic attacks and depression.  Reports her boyfriend's father passed away a couple of weeks ago and pt has been very depressed.  Reports has had 4 panic attacks in the past 24 hours.  Reports chest started hurting last night, describes as pressure and radiates to left shoulder.  Pt says has drank 4 beers in the past 3 hours to try to make chest pain go away.  Pt tearful, says is very depressed but denies SI or HI.  Says she took care of her husband for 2 years before he died and says her boyfriend's father's death brought back a lot of memories.

## 2012-02-08 NOTE — ED Notes (Signed)
Pt also reports has abscess under r arm x 1 week.  Area red and swollen

## 2012-02-09 ENCOUNTER — Encounter (HOSPITAL_COMMUNITY): Payer: Self-pay

## 2012-02-09 ENCOUNTER — Emergency Department (HOSPITAL_COMMUNITY)
Admission: EM | Admit: 2012-02-09 | Discharge: 2012-02-09 | Disposition: A | Discharge status: Home / Self Care | Payer: Self-pay | Attending: Emergency Medicine | Admitting: Emergency Medicine

## 2012-02-09 DIAGNOSIS — F3289 Other specified depressive episodes: Secondary | ICD-10-CM | POA: Insufficient documentation

## 2012-02-09 DIAGNOSIS — F329 Major depressive disorder, single episode, unspecified: Secondary | ICD-10-CM | POA: Insufficient documentation

## 2012-02-09 DIAGNOSIS — F17200 Nicotine dependence, unspecified, uncomplicated: Secondary | ICD-10-CM | POA: Insufficient documentation

## 2012-02-09 DIAGNOSIS — Z882 Allergy status to sulfonamides status: Secondary | ICD-10-CM | POA: Insufficient documentation

## 2012-02-09 DIAGNOSIS — F410 Panic disorder [episodic paroxysmal anxiety] without agoraphobia: Secondary | ICD-10-CM | POA: Insufficient documentation

## 2012-02-09 DIAGNOSIS — K219 Gastro-esophageal reflux disease without esophagitis: Secondary | ICD-10-CM | POA: Insufficient documentation

## 2012-02-09 LAB — CBC WITH DIFFERENTIAL/PLATELET
Basophils Relative: 0 % (ref 0–1)
Eosinophils Absolute: 0.2 10*3/uL (ref 0.0–0.7)
Eosinophils Relative: 2 % (ref 0–5)
Hemoglobin: 14.1 g/dL (ref 12.0–15.0)
Lymphs Abs: 1.8 10*3/uL (ref 0.7–4.0)
MCH: 33.6 pg (ref 26.0–34.0)
MCHC: 35.8 g/dL (ref 30.0–36.0)
MCV: 93.8 fL (ref 78.0–100.0)
Monocytes Absolute: 0.6 10*3/uL (ref 0.1–1.0)
Monocytes Relative: 8 % (ref 3–12)
Neutrophils Relative %: 68 % (ref 43–77)
RBC: 4.2 MIL/uL (ref 3.87–5.11)

## 2012-02-09 LAB — BASIC METABOLIC PANEL
BUN: 3 mg/dL — ABNORMAL LOW (ref 6–23)
Calcium: 8.8 mg/dL (ref 8.4–10.5)
Creatinine, Ser: 0.35 mg/dL — ABNORMAL LOW (ref 0.50–1.10)
GFR calc Af Amer: >90 mL/min (ref >90)
GFR calc non Af Amer: >90 mL/min (ref >90)
Glucose, Bld: 94 mg/dL (ref 70–99)
Potassium: 3.6 mEq/L (ref 3.5–5.1)

## 2012-02-09 MED ORDER — LORAZEPAM 1 MG PO TABS: 1.0000 mg | ORAL_TABLET | Freq: Three times a day (TID) | ORAL | Status: AC | PRN

## 2012-02-09 MED ORDER — LORAZEPAM 1 MG PO TABS
1.0000 mg | ORAL_TABLET | Freq: Once | ORAL | Status: AC
  Administered 2012-02-09: 1 mg via ORAL
  Filled 2012-02-09: qty 1

## 2012-02-09 NOTE — ED Notes (Signed)
Pt reports severe depression

## 2012-02-09 NOTE — ED Provider Notes (Signed)
History   This chart was scribed for Donnetta Hutching, MD by Sofie Rower. The patient was seen in room [facility] and the patient's care was started at 8:23 PM     CSN: [id number]  Arrival date & time 02/09/12  1927   First MD Initiated Contact with Patient 02/09/12 1929      Chief Complaint  Patient presents with  . Medical Clearance    (Consider location/radiation/quality/duration/timing/severity/associated sxs/prior treatment) The history is provided by the patient and a friend. No language interpreter was used.    Monique Nguyen is a 41 y.o. female , with a hx of depression, who presents to the Emergency Department complaining of gradual, progressively worsening, depression onset two years ago with associated symptoms of panic attacks. The pt reports that she has been feeling mad and crying a lot recently. The pt's husband passed away two years ago, however, the pt's significant other recently had his father pass away within the last two months which he believes may have triggered an emotional response within the patient. Modifying factors include taking klonopin which does not provide relief, taking ativan which provides moderate relief, taking Celexa which provides moderate relief, and taking Phenergan which provides moderate relief. The pt has a hx of taking klonopin (onset 2009). The pt denies SI/HI.  The pt is a current everyday smoker and drinks alcohol.   PCP is Dr. Regino Schultze.    Past Medical History  Diagnosis Date  . GERD (gastroesophageal reflux disease)   . Interstitial cystitis   . Bacterial vaginosis   . Anxiety   . Depression   . Panic attacks     Past Surgical History  Procedure Date  . Bladder surgery     X 3    Family History  Problem Relation Age of Onset  . Depression Mother     living  . Pancreatic cancer Father     living    History  Substance Use Topics  . Smoking status: Current Everyday Smoker -- 1.5 packs/day    Types: Cigarettes  .  Smokeless tobacco: Not on file  . Alcohol Use: 1.8 oz/week    3 Cans of beer per week    OB History    Grav Para Term Preterm Abortions TAB SAB Ect Mult Living                  Review of Systems  All other systems reviewed and are negative.    Allergies  Sulfa antibiotics  Home Medications   Current Outpatient Rx  Name Route Sig Dispense Refill  . CITALOPRAM HYDROBROMIDE 40 MG PO TABS Oral Take 40 mg by mouth daily.    Marland Kitchen CLONAZEPAM 2 MG PO TABS Oral Take 2 mg by mouth 3 (three) times daily as needed.    . IBUPROFEN 200 MG PO CAPS Oral Take 2 capsules by mouth as needed.    Marland Kitchen PROMETHAZINE HCL 12.5 MG PO TABS Oral Take 12.5 mg by mouth every 6 (six) hours as needed. For nausea      BP 104/64  Pulse 99  Temp 98.6 F (37 C) (Oral)  Resp 16  Ht 5\' 1"  (1.549 m)  Wt 116 lb (52.617 kg)  BMI 21.92 kg/m2  SpO2 98%  LMP 01/29/2012  Physical Exam  Nursing note and vitals reviewed. Constitutional: She is oriented to person, place, and time. She appears well-developed and well-nourished.  HENT:  Head: Normocephalic and atraumatic.  Eyes: Conjunctivae and EOM are normal. Pupils are equal,  round, and reactive to light.  Neck: Normal range of motion. Neck supple.  Cardiovascular: Normal rate, regular rhythm and normal heart sounds.   Pulmonary/Chest: Effort normal and breath sounds normal.  Abdominal: Soft. Bowel sounds are normal.  Musculoskeletal: Normal range of motion.  Neurological: She is alert and oriented to person, place, and time.  Skin: Skin is warm and dry.  Psychiatric:       Flat affect, the pt appears depressed.     ED Course  Procedures (including critical care time)  DIAGNOSTIC STUDIES: Oxygen Saturation is 98% on room air, normal by my interpretation.    COORDINATION OF CARE:    8:28PM- Administration of Ativan and follow up with behavior health specialist discussed. Pt agrees with treatment.   Labs Reviewed  BASIC METABOLIC PANEL - Abnormal;  Notable for the following:    BUN 3 (*)     Creatinine, Ser 0.35 (*)     All other components within normal limits  ETHANOL - Abnormal; Notable for the following:    Alcohol, Ethyl (B) 130 (*)     All other components within normal limits  CBC WITH DIFFERENTIAL  URINE RAPID DRUG SCREEN (HOSP PERFORMED)   No results found.   No diagnosis found.    MDM  No suicidal or homicidal ideation. Behavioral health consult. Will change Rx from Klonopin to Ativan because patient feels this helps her more. She has a primary care relationship in [place].      Add scribe attestation statement}I personally performed the services described in this documentation, which was scribed in my presence. The recorded information has been reviewed and considered.   Donnetta Hutching, MD 02/09/12 2206

## 2012-02-09 NOTE — ED Notes (Signed)
Seen in ED yesterday and left AMA. Patient is crying and boyfriend states she is having more frequent attacks.

## 2012-02-09 NOTE — BH Assessment (Signed)
Assessment Note   Monique Nguyen is an 41 y.o. female. Pt reports she is crying a lot over the last week and having anxiety and panic attacks on a daily basis over the past 2 weeks.  Pt reports recent death of her boyfriend's father 3 weeks ago and the death of her husband 2 years ago as stressors.  Pt reports she takes klonapin and celexa from her PCP but they are not helping currently.  Pt has begun process to see psychiatrist at [facility] but her first appt with the Dr is not until 10/24.  Pt denies SI/HI/AV.  Pt reports that she has been drinking 6-12 beers daily for past 2 months and we discussed this as not a good way to cope with the anxiety and depression.  [facility] is aware of her alcohol use.  Axis I: Anxiety Disorder NOS Axis II: Deferred Axis III:  Past Medical History  Diagnosis Date  . GERD (gastroesophageal reflux disease)   . Interstitial cystitis   . Bacterial vaginosis   . Anxiety   . Depression   . Panic attacks    Axis IV: problems with primary support group Axis V: 41-50 serious symptoms  Past Medical History:  Past Medical History  Diagnosis Date  . GERD (gastroesophageal reflux disease)   . Interstitial cystitis   . Bacterial vaginosis   . Anxiety   . Depression   . Panic attacks     Past Surgical History  Procedure Date  . Bladder surgery     X 3    Family History:  Family History  Problem Relation Age of Onset  . Depression Mother     living  . Pancreatic cancer Father     living    Social History:  reports that she has been smoking Cigarettes.  She has been smoking about 1.5 packs per day. She does not have any smokeless tobacco history on file. She reports that she drinks about 1.8 ounces of alcohol per week. She reports that she uses illicit drugs (Marijuana).  Additional Social History:  Alcohol / Drug Use Pain Medications: Pt denies Prescriptions: Pt denies Over the Counter: Pt denies History of alcohol / drug use?:  Yes Negative Consequences of Use: Legal Substance #1 Name of Substance 1: alcohol 1 - Amount (size/oz): 6-12 beers 1 - Frequency: daily 1 - Duration: 1-2 months 1 - Last Use / Amount: 8/29, 6 beers  CIWA: CIWA-Ar BP: 104/64 mmHg Pulse Rate: 99  Nausea and Vomiting: no nausea and no vomiting Tactile Disturbances: none Tremor: no tremor Auditory Disturbances: not present Paroxysmal Sweats: no sweat visible Visual Disturbances: not present Anxiety: no anxiety, at ease Headache, Fullness in Head: none present Agitation: normal activity Orientation and Clouding of Sensorium: oriented and can do serial additions CIWA-Ar Total: 0  COWS:    Allergies:  Allergies  Allergen Reactions  . Sulfa Antibiotics Nausea And Vomiting and Other (See Comments)    Blurred vision and headaches    Home Medications:  (Not in a hospital admission)  OB/GYN Status:  Patient's last menstrual period was 01/29/2012.  General Assessment Data Location of Assessment: [facility] ACT Assessment: Yes Living Arrangements: Spouse/significant other;Children Can pt return to current living arrangement?: Yes     Risk to self Suicidal Ideation: No Suicidal Intent: No Is patient at risk for suicide?: No Suicidal Plan?: No Access to Means: No What has been your use of drugs/alcohol within the last 12 months?: Current alcohol use Previous Attempts/Gestures: No  Intentional Self Injurious Behavior: None (self cutting years ago, not current) Family Suicide History: No Recent stressful life event(s): Loss (Comment) (boyfriend's father 3 weeks ago, husband 2 years ago) Persecutory voices/beliefs?: No Depression: Yes Depression Symptoms: Tearfulness;Fatigue;Guilt;Loss of interest in usual pleasures;Feeling worthless/self pity Substance abuse history and/or treatment for substance abuse?: No Suicide prevention information given to non-admitted patients: Yes  Risk to Others Homicidal Ideation: No Thoughts of  Harm to Others: No Current Homicidal Intent: No Current Homicidal Plan: No Access to Homicidal Means: No History of harm to others?: No Assessment of Violence: None Noted Does patient have access to weapons?: No Criminal Charges Pending?: No Does patient have a court date: No  Psychosis Hallucinations: None noted Delusions: None noted  Mental Status Report Appear/Hygiene: Disheveled Eye Contact: Fair Motor Activity: Unremarkable Speech: Logical/coherent Level of Consciousness: Alert Mood: Depressed Affect: Appropriate to circumstance Anxiety Level: Minimal Thought Processes: Coherent;Relevant Judgement: Unimpaired Orientation: Person;Place;Time;Situation Obsessive Compulsive Thoughts/Behaviors: None  Cognitive Functioning Concentration: Normal Memory: Recent Intact;Remote Intact IQ: Average Insight: Fair Impulse Control: Fair Appetite: Poor Weight Loss: 0  Weight Gain: 0  Sleep: Increased Total Hours of Sleep: 11  Vegetative Symptoms: None  ADLScreening [facility] Assessment Services) Patient's cognitive ability adequate to safely complete daily activities?: Yes Patient able to express need for assistance with ADLs?: Yes Independently performs ADLs?: Yes (appropriate for developmental age)  Abuse/Neglect [facility]) Physical Abuse: Denies Verbal Abuse: Denies Sexual Abuse: Denies  Prior Inpatient Therapy Prior Inpatient Therapy: No  Prior Outpatient Therapy Prior Outpatient Therapy: Yes (Pt has begun intake process at [facility]  ADL Screening (condition at time of admission) Patient's cognitive ability adequate to safely complete daily activities?: Yes Patient able to express need for assistance with ADLs?: Yes Independently performs ADLs?: Yes (appropriate for developmental age) Weakness of Legs: None Weakness of Arms/Hands: None  Home Assistive Devices/Equipment Home Assistive Devices/Equipment: None    Abuse/Neglect Assessment (Assessment to be  complete while patient is alone) Physical Abuse: Denies Verbal Abuse: Denies Sexual Abuse: Denies Exploitation of patient/patient's resources: Denies Self-Neglect: Denies Values / Beliefs Cultural Requests During Hospitalization: None Spiritual Requests During Hospitalization: None   Advance Directives (For Healthcare) Advance Directive: Patient does not have advance directive;Patient would not like information    Additional Information 1:1 In Past 12 Months?: No CIRT Risk: No Elopement Risk: No Does patient have medical clearance?: Yes     Disposition: Discussed this pt with Dr Adriana Simas of [facility].  Pt has begun process to see psychiatrist and counselor at [facility]. But Dr appt is not until 10/24--Dr Adriana Simas will prescribe ativan for anxiety.  Pt will talk with PCP about ativan until she can see psychiatrist.  All in agreement with this plan. Disposition Disposition of Patient: Other dispositions Other disposition(s): To current provider (PCP and [facility]) Patient referred to:  (PCP, [facility])  On Site Evaluation by:   Reviewed with Physician:     Lorri Frederick 02/09/2012 10:07 PM

## 2012-02-11 NOTE — ED Provider Notes (Deleted)
History     CSN: [id number]  Arrival date & time 02/08/12  1618   First MD Initiated Contact with Patient 02/08/12 1658      Chief Complaint  Patient presents with  . Chest Pain  . Depression  . Panic Attack  . Abscess    (Consider location/radiation/quality/duration/timing/severity/associated sxs/prior treatment) HPI  Past Medical History  Diagnosis Date  . GERD (gastroesophageal reflux disease)   . Interstitial cystitis   . Bacterial vaginosis   . Anxiety   . Depression   . Panic attacks     Past Surgical History  Procedure Date  . Bladder surgery     X 3    Family History  Problem Relation Age of Onset  . Depression Mother     living  . Pancreatic cancer Father     living    History  Substance Use Topics  . Smoking status: Current Everyday Smoker -- 1.5 packs/day    Types: Cigarettes  . Smokeless tobacco: Not on file  . Alcohol Use: 1.8 oz/week    3 Cans of beer per week    OB History    Grav Para Term Preterm Abortions TAB SAB Ect Mult Living                  Review of Systems  Allergies  Sulfa antibiotics  Home Medications   Current Outpatient Rx  Name Route Sig Dispense Refill  . CITALOPRAM HYDROBROMIDE 40 MG PO TABS Oral Take 40 mg by mouth daily.    Marland Kitchen CLONAZEPAM 2 MG PO TABS Oral Take 2 mg by mouth 3 (three) times daily as needed.    . IBUPROFEN 200 MG PO CAPS Oral Take 2 capsules by mouth as needed.    Marland Kitchen LORAZEPAM 1 MG PO TABS Oral Take 1 tablet (1 mg total) by mouth 3 (three) times daily as needed for anxiety. 30 tablet 0  . PROMETHAZINE HCL 12.5 MG PO TABS Oral Take 12.5 mg by mouth every 6 (six) hours as needed. For nausea      BP 98/69  Pulse 101  Temp 98.5 F (36.9 C) (Oral)  Resp 20  Ht 5\' 1"  (1.549 m)  Wt 114 lb (51.71 kg)  BMI 21.54 kg/m2  SpO2 100%  LMP 01/29/2012  Physical Exam  ED Course  Procedures (including critical care time)  Labs Reviewed - No data to display No results found.   No diagnosis  found.    MDM   Long-standing depression and panic attacks. No suicidal or homicidal ideation. Behavioral health consult    I personally performed the services described in this documentation, which was scribed in my presence. The recorded information has been reviewed and considered.    Donnetta Hutching, MD 02/11/12 1059

## 2012-03-23 NOTE — ED Provider Notes (Signed)
History     CSN: [id number]  Arrival date & time 02/08/12  1618   First MD Initiated Contact with Patient 02/08/12 1658      Chief Complaint  Patient presents with  . Chest Pain  . Depression  . Panic Attack  . Abscess    (Consider location/radiation/quality/duration/timing/severity/associated sxs/prior treatment) HPI.... patient presents with depression and panic attacks. She reports passing away of her boyfriend's father recently which is set her back.  No suicidal or homicidal ideation. Past medical history includes depression panic attacks.  Severity is moderate  Past Medical History  Diagnosis Date  . GERD (gastroesophageal reflux disease)   . Interstitial cystitis   . Bacterial vaginosis   . Anxiety   . Depression   . Panic attacks     Past Surgical History  Procedure Date  . Bladder surgery     X 3    Family History  Problem Relation Age of Onset  . Depression Mother     living  . Pancreatic cancer Father     living    History  Substance Use Topics  . Smoking status: Current Every Day Smoker -- 1.5 packs/day    Types: Cigarettes  . Smokeless tobacco: Not on file  . Alcohol Use: 1.8 oz/week    3 Cans of beer per week    OB History    Grav Para Term Preterm Abortions TAB SAB Ect Mult Living                  Review of Systems  All other systems reviewed and are negative.    Allergies  Sulfa antibiotics  Home Medications   Current Outpatient Rx  Name Route Sig Dispense Refill  . CITALOPRAM HYDROBROMIDE 40 MG PO TABS Oral Take 40 mg by mouth daily.    Marland Kitchen CLONAZEPAM 2 MG PO TABS Oral Take 2 mg by mouth 3 (three) times daily as needed.    . IBUPROFEN 200 MG PO CAPS Oral Take 2 capsules by mouth as needed.    Marland Kitchen PROMETHAZINE HCL 12.5 MG PO TABS Oral Take 12.5 mg by mouth every 6 (six) hours as needed. For nausea      BP 98/69  Pulse 101  Temp 98.5 F (36.9 C) (Oral)  Resp 20  Ht 5\' 1"  (1.549 m)  Wt 114 lb (51.71 kg)  BMI 21.54 kg/m2   SpO2 100%  LMP 01/29/2012  Physical Exam  Nursing note and vitals reviewed. Constitutional: She is oriented to person, place, and time. She appears well-developed and well-nourished.  HENT:  Head: Normocephalic and atraumatic.  Eyes: Conjunctivae normal and EOM are normal. Pupils are equal, round, and reactive to light.  Neck: Normal range of motion. Neck supple.  Cardiovascular: Normal rate, regular rhythm and normal heart sounds.   Pulmonary/Chest: Effort normal and breath sounds normal.  Abdominal: Soft. Bowel sounds are normal.  Musculoskeletal: Normal range of motion.  Neurological: She is alert and oriented to person, place, and time.  Skin: Skin is warm and dry.  Psychiatric:       Flat affect, depressed    ED Course  Procedures (including critical care time)  Labs Reviewed - No data to display No results found.   1. Depression       MDM  Behavioral health consult was initiated. Patient ultimately left AMA.  No suicidal or homicidal ideation.        Donnetta Hutching, MD 03/23/12 1742

## 2012-04-10 ENCOUNTER — Emergency Department (HOSPITAL_COMMUNITY)
Admission: EM | Admit: 2012-04-10 | Discharge: 2012-04-10 | Disposition: A | Discharge status: Home / Self Care | Payer: Self-pay | Attending: Emergency Medicine | Admitting: Emergency Medicine

## 2012-04-10 ENCOUNTER — Other Ambulatory Visit: Payer: Self-pay

## 2012-04-10 ENCOUNTER — Encounter (HOSPITAL_COMMUNITY): Payer: Self-pay | Admitting: *Deleted

## 2012-04-10 DIAGNOSIS — R110 Nausea: Secondary | ICD-10-CM | POA: Insufficient documentation

## 2012-04-10 DIAGNOSIS — Z8719 Personal history of other diseases of the digestive system: Secondary | ICD-10-CM | POA: Insufficient documentation

## 2012-04-10 DIAGNOSIS — F3289 Other specified depressive episodes: Secondary | ICD-10-CM | POA: Insufficient documentation

## 2012-04-10 DIAGNOSIS — F410 Panic disorder [episodic paroxysmal anxiety] without agoraphobia: Secondary | ICD-10-CM | POA: Insufficient documentation

## 2012-04-10 DIAGNOSIS — F411 Generalized anxiety disorder: Secondary | ICD-10-CM | POA: Insufficient documentation

## 2012-04-10 DIAGNOSIS — J029 Acute pharyngitis, unspecified: Secondary | ICD-10-CM | POA: Insufficient documentation

## 2012-04-10 DIAGNOSIS — Z87448 Personal history of other diseases of urinary system: Secondary | ICD-10-CM | POA: Insufficient documentation

## 2012-04-10 DIAGNOSIS — F329 Major depressive disorder, single episode, unspecified: Secondary | ICD-10-CM | POA: Insufficient documentation

## 2012-04-10 MED ORDER — LORAZEPAM 1 MG PO TABS
1.0000 mg | ORAL_TABLET | Freq: Once | ORAL | Status: AC
  Administered 2012-04-10: 1 mg via ORAL
  Filled 2012-04-10: qty 1

## 2012-04-10 NOTE — ED Notes (Signed)
Discharge instructions reviewed with pt, questions answered. Pt verbalized understanding.  

## 2012-04-10 NOTE — ED Notes (Signed)
Pt states sore throat and poor intake started 3 days prior, anxiety flare up started 3 days ago as well. Nausea denies emesis.

## 2012-04-10 NOTE — ED Provider Notes (Signed)
History     CSN: [id number]  Arrival date & time 04/10/12  0214   First MD Initiated Contact with Patient 04/10/12 0226      Chief Complaint  Patient presents with  . Panic Attack  . Nausea  . Sore Throat    (Consider location/radiation/quality/duration/timing/severity/associated sxs/prior treatment) HPI   Monique Nguyen is a 41 y.o. female who presents to the Emergency Department complaining of anxiety and sore throat. She is normally on ativan three times a day and clonazepam. She is out of both x 3 days. She does not have the money to see her PCP. She was seen at [facility] 04/05/12 however they did not provide medicines and have set her up for an additional appointment with the psychiatrist. She has a sore throat present for several days that makes it difficult to swallow food. She feels she has no moisture in her mouth to swallow. She denies fever, chills, difficulty speaking, dizziness, weakness, SI, HI, AVH.  PCP Dr. Regino Schultze    Past Medical History  Diagnosis Date  . GERD (gastroesophageal reflux disease)   . Interstitial cystitis   . Bacterial vaginosis   . Anxiety   . Depression   . Panic attacks     Past Surgical History  Procedure Date  . Bladder surgery     X 3    Family History  Problem Relation Age of Onset  . Depression Mother     living  . Pancreatic cancer Father     living    History  Substance Use Topics  . Smoking status: Current Every Day Smoker -- 1.5 packs/day    Types: Cigarettes  . Smokeless tobacco: Not on file  . Alcohol Use: 1.8 oz/week    3 Cans of beer per week    OB History    Grav Para Term Preterm Abortions TAB SAB Ect Mult Living                  Review of Systems  Constitutional: Negative for fever.       10 Systems reviewed and are negative for acute change except as noted in the HPI.  HENT: Negative for congestion.   Eyes: Negative for discharge and redness.  Respiratory: Negative for cough and shortness of  breath.   Cardiovascular: Negative for chest pain.  Gastrointestinal: Negative for vomiting and abdominal pain.  Musculoskeletal: Negative for back pain.  Skin: Negative for rash.  Neurological: Negative for syncope, numbness and headaches.  Psychiatric/Behavioral:       No behavior change, increased anxiety    Allergies  Sulfa antibiotics  Home Medications   Current Outpatient Rx  Name Route Sig Dispense Refill  . IBUPROFEN 200 MG PO CAPS Oral Take 2 capsules by mouth as needed.    Marland Kitchen LORAZEPAM 1 MG PO TABS Oral Take 1 mg by mouth every 8 (eight) hours.    Marland Kitchen PROMETHAZINE HCL 12.5 MG PO TABS Oral Take 12.5 mg by mouth every 6 (six) hours as needed. For nausea    . CITALOPRAM HYDROBROMIDE 40 MG PO TABS Oral Take 40 mg by mouth daily.    Marland Kitchen CLONAZEPAM 2 MG PO TABS Oral Take 2 mg by mouth 3 (three) times daily as needed.      BP 134/88  Pulse 111  Temp 98.4 F (36.9 C) (Oral)  Resp 28  Ht 5\' 1"  (1.549 m)  Wt 105 lb (47.628 kg)  BMI 19.84 kg/m2  SpO2 97%  LMP 04/10/2012  Physical Exam  Nursing note and vitals reviewed. Constitutional:       Awake, alert, nontoxic appearance.Patient has pink hair.  HENT:  Head: Atraumatic.  Eyes: Right eye exhibits no discharge. Left eye exhibits no discharge.  Neck: Neck supple.  Pulmonary/Chest: Effort normal. She exhibits no tenderness.  Abdominal: Soft. There is no tenderness. There is no rebound.  Musculoskeletal: She exhibits no tenderness.       Baseline ROM, no obvious new focal weakness.  Neurological:       Mental status and motor strength appears baseline for patient and situation.  Skin: No rash noted.       Punctate psoriasis, bodywide  Psychiatric:       Anxious. Denies SI, HI, AVH    ED Course  Procedures (including critical care time)   Date: 04/10/2012  0223  Rate: 110  Rhythm: sinus tachycardia  QRS Axis: normal  Intervals: normal  ST/T Wave abnormalities: normal  Conduction Disutrbances:none  Narrative  Interpretation:   Old EKG Reviewed: none available     MDM  Patient with anxiety disorder out of medicines x 3 days. Given ativan. Encouraged to follow up with her PCP this week. Pt stable in ED with no significant deterioration in condition.The patient appears reasonably screened and/or stabilized for discharge and I doubt any other medical condition or other [facility] requiring further screening, evaluation, or treatment in the ED at this time prior to discharge.  MDM Reviewed: nursing note and vitals Interpretation: ECG           Nicoletta Dress. Colon Branch, MD 04/10/12 [phone]

## 2012-05-25 ENCOUNTER — Encounter (HOSPITAL_COMMUNITY): Payer: Self-pay | Admitting: *Deleted

## 2012-05-25 ENCOUNTER — Emergency Department (HOSPITAL_COMMUNITY)
Admission: EM | Admit: 2012-05-25 | Discharge: 2012-05-25 | Disposition: A | Discharge status: Home / Self Care | Payer: Self-pay | Attending: Emergency Medicine | Admitting: Emergency Medicine

## 2012-05-25 DIAGNOSIS — Z8719 Personal history of other diseases of the digestive system: Secondary | ICD-10-CM | POA: Insufficient documentation

## 2012-05-25 DIAGNOSIS — L0201 Cutaneous abscess of face: Secondary | ICD-10-CM | POA: Insufficient documentation

## 2012-05-25 DIAGNOSIS — F1210 Cannabis abuse, uncomplicated: Secondary | ICD-10-CM | POA: Insufficient documentation

## 2012-05-25 DIAGNOSIS — F411 Generalized anxiety disorder: Secondary | ICD-10-CM | POA: Insufficient documentation

## 2012-05-25 DIAGNOSIS — Z8619 Personal history of other infectious and parasitic diseases: Secondary | ICD-10-CM | POA: Insufficient documentation

## 2012-05-25 DIAGNOSIS — Z8659 Personal history of other mental and behavioral disorders: Secondary | ICD-10-CM | POA: Insufficient documentation

## 2012-05-25 DIAGNOSIS — Z872 Personal history of diseases of the skin and subcutaneous tissue: Secondary | ICD-10-CM | POA: Insufficient documentation

## 2012-05-25 DIAGNOSIS — L03211 Cellulitis of face: Secondary | ICD-10-CM | POA: Insufficient documentation

## 2012-05-25 DIAGNOSIS — F17200 Nicotine dependence, unspecified, uncomplicated: Secondary | ICD-10-CM | POA: Insufficient documentation

## 2012-05-25 DIAGNOSIS — Z79899 Other long term (current) drug therapy: Secondary | ICD-10-CM | POA: Insufficient documentation

## 2012-05-25 DIAGNOSIS — L0291 Cutaneous abscess, unspecified: Secondary | ICD-10-CM

## 2012-05-25 HISTORY — DX: Psoriasis, unspecified: L40.9

## 2012-05-25 MED ORDER — HYDROCODONE-ACETAMINOPHEN 7.5-325 MG PO TABS: 1.0000 | ORAL_TABLET | ORAL | Status: AC | PRN

## 2012-05-25 MED ORDER — DOXYCYCLINE HYCLATE 100 MG PO CAPS: 100.0000 mg | ORAL_CAPSULE | Freq: Two times a day (BID) | ORAL | Status: DC

## 2012-05-25 MED ORDER — ACETAMINOPHEN-CODEINE #3 300-30 MG PO TABS: 1.0000 | ORAL_TABLET | ORAL | Status: DC | PRN

## 2012-05-25 MED ORDER — DOXYCYCLINE HYCLATE 100 MG PO TABS
100.0000 mg | ORAL_TABLET | Freq: Once | ORAL | Status: AC
  Administered 2012-05-25: 100 mg via ORAL
  Filled 2012-05-25: qty 1

## 2012-05-25 MED ORDER — PREDNISONE 10 MG PO TABS: ORAL_TABLET | ORAL | Status: DC

## 2012-05-25 MED ORDER — KETOROLAC TROMETHAMINE 10 MG PO TABS
10.0000 mg | ORAL_TABLET | Freq: Once | ORAL | Status: AC
  Administered 2012-05-25: 10 mg via ORAL
  Filled 2012-05-25: qty 1

## 2012-05-25 MED ORDER — PROMETHAZINE HCL 12.5 MG PO TABS
12.5000 mg | ORAL_TABLET | Freq: Once | ORAL | Status: AC
  Administered 2012-05-25: 12.5 mg via ORAL
  Filled 2012-05-25: qty 1

## 2012-05-25 MED ORDER — AMOXICILLIN 500 MG PO CAPS: 500.0000 mg | ORAL_CAPSULE | Freq: Three times a day (TID) | ORAL | Status: DC

## 2012-05-25 MED ORDER — HYDROCODONE-ACETAMINOPHEN 5-325 MG PO TABS
2.0000 | ORAL_TABLET | Freq: Once | ORAL | Status: AC
  Administered 2012-05-25: 2 via ORAL
  Filled 2012-05-25: qty 2

## 2012-05-25 MED ORDER — CEFTRIAXONE SODIUM 1 G IJ SOLR
1.0000 g | Freq: Once | INTRAMUSCULAR | Status: AC
  Administered 2012-05-25: 1 g via INTRAMUSCULAR
  Filled 2012-05-25: qty 10

## 2012-05-25 NOTE — ED Provider Notes (Signed)
History     CSN: [id number]  Arrival date & time 05/25/12  1701   First MD Initiated Contact with Patient 05/25/12 1821      Chief Complaint  Patient presents with  . Abscess    (Consider location/radiation/quality/duration/timing/severity/associated sxs/prior treatment) Patient is a 41 y.o. female presenting with abscess. The history is provided by the patient.  Abscess  This is a new problem. The current episode started more than one week ago. The onset was gradual. The problem occurs continuously. The problem has been gradually worsening. The abscess is present on the face. The problem is moderate. The abscess is characterized by redness and painfulness. It is unknown what she was exposed to. Pertinent negatives include no anorexia, no fever, no vomiting and no cough. Her past medical history does not include atopy in family or skin abscesses in family. There were no sick contacts. She has received no recent medical care.    Past Medical History  Diagnosis Date  . GERD (gastroesophageal reflux disease)   . Interstitial cystitis   . Bacterial vaginosis   . Anxiety   . Depression   . Panic attacks   . Psoriasis     Past Surgical History  Procedure Date  . Bladder surgery     X 3    Family History  Problem Relation Age of Onset  . Depression Mother     living  . Pancreatic cancer Father     living    History  Substance Use Topics  . Smoking status: Current Every Day Smoker -- 1.5 packs/day    Types: Cigarettes  . Smokeless tobacco: Not on file  . Alcohol Use: 1.8 oz/week    3 Cans of beer per week    OB History    Grav Para Term Preterm Abortions TAB SAB Ect Mult Living                  Review of Systems  Constitutional: Negative for fever and activity change.       All ROS Neg except as noted in HPI  HENT: Negative for nosebleeds and neck pain.   Eyes: Negative for photophobia and discharge.  Respiratory: Negative for cough, shortness of breath and  wheezing.   Cardiovascular: Negative for chest pain and palpitations.  Gastrointestinal: Negative for vomiting, abdominal pain, blood in stool and anorexia.  Genitourinary: Negative for dysuria, frequency and hematuria.  Musculoskeletal: Negative for back pain and arthralgias.  Skin: Negative.        abscess  Neurological: Negative for dizziness, seizures and speech difficulty.  Psychiatric/Behavioral: Negative for hallucinations and confusion.    Allergies  Sulfa antibiotics  Home Medications   Current Outpatient Rx  Name  Route  Sig  Dispense  Refill  . LORAZEPAM 1 MG PO TABS   Oral   Take 1 mg by mouth every 8 (eight) hours.         Marland Kitchen NAPROXEN SODIUM 220 MG PO CAPS   Oral   Take 1 capsule by mouth daily as needed. For pain         . PROMETHAZINE HCL 12.5 MG PO TABS   Oral   Take 12.5 mg by mouth every 6 (six) hours as needed. For nausea           BP 104/72  Pulse 125  Temp 97.8 F (36.6 C) (Oral)  Ht 5\' 1"  (1.549 m)  Wt 116 lb (52.617 kg)  BMI 21.92 kg/m2  SpO2 98%  LMP  04/08/2012  Physical Exam  Nursing note and vitals reviewed. Constitutional: She is oriented to person, place, and time. She appears well-developed and well-nourished.  Non-toxic appearance.  HENT:  Head: Normocephalic.    Right Ear: Tympanic membrane and external ear normal.  Left Ear: Tympanic membrane and external ear normal.       Consistent with psoriasis on the face and in the years. The tympanic membrane is within normal limits bilaterally. There is increased redness of the left face at the angle of the left mandible. There is an abscess present. There no submandibular lymph nodes palpable.  Eyes: EOM and lids are normal. Pupils are equal, round, and reactive to light.  Neck: Normal range of motion. Neck supple. Carotid bruit is not present.  Cardiovascular: Regular rhythm, normal heart sounds, intact distal pulses and normal pulses.  Tachycardia present.   Pulmonary/Chest: Breath  sounds normal. No respiratory distress.  Abdominal: Soft. Bowel sounds are normal. There is no tenderness. There is no guarding.  Musculoskeletal: Normal range of motion.  Lymphadenopathy:       Head (right side): No submandibular adenopathy present.       Head (left side): No submandibular adenopathy present.    She has no cervical adenopathy.  Neurological: She is alert and oriented to person, place, and time. She has normal strength. No cranial nerve deficit or sensory deficit.  Skin: Skin is warm and dry. Rash noted.  Psychiatric: She has a normal mood and affect. Her speech is normal.    ED Course  Procedures (including critical care time)  Labs Reviewed - No data to display No results found.   No diagnosis found.    MDM  I have reviewed nursing notes, vital signs, and all appropriate lab and imaging results for this patient. Patient has an abscess at the left lower jaw. There is no red streaking noted. There is some mild increased redness present there is some small bowel to increase warmth but not hot. No palpable submental or deep cervical chain lymphadenopathy appreciated. The plan at this time is to apply warm compresses to 3 times daily. Amoxil 3 times daily, doxycycline 2 times daily. Norco every 4 hours as needed for pain #20 tablets. Patient advised to see the surgeon if not improving. Patient treated with intramuscular Rocephin in the emergency department.       Kathie Dike, [place] 05/26/12 [phone]

## 2012-05-25 NOTE — ED Notes (Signed)
Abscess to lt jaw, x 5 days,

## 2012-05-25 NOTE — ED Notes (Signed)
Discharge instructions reviewed with pt, questions answered. Pt verbalized understanding.  

## 2012-05-30 NOTE — ED Provider Notes (Signed)
Medical screening examination/treatment/procedure(s) were performed by non-physician practitioner and as supervising physician I was immediately available for consultation/collaboration.   Shelda Jakes, MD 05/30/12 1006

## 2012-06-03 ENCOUNTER — Emergency Department (HOSPITAL_COMMUNITY)
Admission: EM | Admit: 2012-06-03 | Discharge: 2012-06-04 | Disposition: A | Discharge status: Home / Self Care | Payer: Self-pay | Attending: Emergency Medicine | Admitting: Emergency Medicine

## 2012-06-03 ENCOUNTER — Encounter (HOSPITAL_COMMUNITY): Payer: Self-pay | Admitting: *Deleted

## 2012-06-03 DIAGNOSIS — R6883 Chills (without fever): Secondary | ICD-10-CM | POA: Insufficient documentation

## 2012-06-03 DIAGNOSIS — L293 Anogenital pruritus, unspecified: Secondary | ICD-10-CM | POA: Insufficient documentation

## 2012-06-03 DIAGNOSIS — Z79899 Other long term (current) drug therapy: Secondary | ICD-10-CM | POA: Insufficient documentation

## 2012-06-03 DIAGNOSIS — F329 Major depressive disorder, single episode, unspecified: Secondary | ICD-10-CM | POA: Insufficient documentation

## 2012-06-03 DIAGNOSIS — R197 Diarrhea, unspecified: Secondary | ICD-10-CM | POA: Insufficient documentation

## 2012-06-03 DIAGNOSIS — F17200 Nicotine dependence, unspecified, uncomplicated: Secondary | ICD-10-CM | POA: Insufficient documentation

## 2012-06-03 DIAGNOSIS — Z8742 Personal history of other diseases of the female genital tract: Secondary | ICD-10-CM | POA: Insufficient documentation

## 2012-06-03 DIAGNOSIS — Z872 Personal history of diseases of the skin and subcutaneous tissue: Secondary | ICD-10-CM | POA: Insufficient documentation

## 2012-06-03 DIAGNOSIS — N760 Acute vaginitis: Secondary | ICD-10-CM | POA: Insufficient documentation

## 2012-06-03 DIAGNOSIS — F3289 Other specified depressive episodes: Secondary | ICD-10-CM | POA: Insufficient documentation

## 2012-06-03 DIAGNOSIS — R112 Nausea with vomiting, unspecified: Secondary | ICD-10-CM | POA: Insufficient documentation

## 2012-06-03 DIAGNOSIS — Z8719 Personal history of other diseases of the digestive system: Secondary | ICD-10-CM | POA: Insufficient documentation

## 2012-06-03 DIAGNOSIS — R109 Unspecified abdominal pain: Secondary | ICD-10-CM | POA: Insufficient documentation

## 2012-06-03 DIAGNOSIS — F410 Panic disorder [episodic paroxysmal anxiety] without agoraphobia: Secondary | ICD-10-CM | POA: Insufficient documentation

## 2012-06-03 NOTE — ED Notes (Addendum)
Pt c/o chills, nv//d, and lower abdominal pain, dysuria and vaginal itching x 4 days. Pt also states she has not had a period in 7 wks.

## 2012-06-04 LAB — CBC
MCH: 33.9 pg (ref 26.0–34.0)
MCHC: 35.2 g/dL (ref 30.0–36.0)
MCV: 96.2 fL (ref 78.0–100.0)
Platelets: 320 10*3/uL (ref 150–400)
RBC: 4.25 MIL/uL (ref 3.87–5.11)

## 2012-06-04 LAB — WET PREP, GENITAL
Clue Cells Wet Prep HPF POC: NONE SEEN
Trich, Wet Prep: NONE SEEN

## 2012-06-04 LAB — BASIC METABOLIC PANEL
CO2: 26 mEq/L (ref 19–32)
Calcium: 8.8 mg/dL (ref 8.4–10.5)
Creatinine, Ser: 0.39 mg/dL — ABNORMAL LOW (ref 0.50–1.10)
GFR calc non Af Amer: >90 mL/min (ref >90)
Sodium: 138 mEq/L (ref 135–145)

## 2012-06-04 LAB — HCG, SERUM, QUALITATIVE: Preg, Serum: NEGATIVE

## 2012-06-04 MED ORDER — PROMETHAZINE HCL 25 MG PO TABS: 25.0000 mg | ORAL_TABLET | Freq: Four times a day (QID) | ORAL | Status: DC | PRN

## 2012-06-04 MED ORDER — ONDANSETRON HCL 4 MG/2ML IJ SOLN
4.0000 mg | Freq: Once | INTRAMUSCULAR | Status: AC
  Administered 2012-06-04: 4 mg via INTRAVENOUS
  Filled 2012-06-04: qty 2

## 2012-06-04 MED ORDER — SODIUM CHLORIDE 0.9 % IV SOLN
1000.0000 mL | Freq: Once | INTRAVENOUS | Status: AC
  Administered 2012-06-04: 1000 mL via INTRAVENOUS

## 2012-06-04 MED ORDER — SODIUM CHLORIDE 0.9 % IV SOLN: 1000.0000 mL | INTRAVENOUS | Status: DC

## 2012-06-04 NOTE — ED Provider Notes (Signed)
History     CSN: [id number]  Arrival date & time 06/03/12  2154   First MD Initiated Contact with Patient 06/03/12 2359      Chief Complaint  Patient presents with  . Diarrhea  . Nausea  . Abdominal Pain  . Chills  . Vaginal Itching     The history is provided by the patient.   the patient reports 3-4 days of nausea vomiting and diarrhea with decreased oral intake.  She also has new vaginal itching.  No new vaginal discharge.  No abnormal vaginal bleeding.  No abdominal pain.  No chest pain or shortness of breath.  No fevers or chills.  No recent sick contacts.  Daily keep some fluids down but not as much his usual.  No dysuria or urinary frequency.  Nothing improves or worsens her symptoms.  Symptoms are mild to moderate in severity.  Past Medical History  Diagnosis Date  . GERD (gastroesophageal reflux disease)   . Interstitial cystitis   . Bacterial vaginosis   . Anxiety   . Depression   . Panic attacks   . Psoriasis     Past Surgical History  Procedure Date  . Bladder surgery     X 3    Family History  Problem Relation Age of Onset  . Depression Mother     living  . Pancreatic cancer Father     living    History  Substance Use Topics  . Smoking status: Current Every Day Smoker -- 1.5 packs/day    Types: Cigarettes  . Smokeless tobacco: Not on file  . Alcohol Use: 1.8 oz/week    3 Cans of beer per week    OB History    Grav Para Term Preterm Abortions TAB SAB Ect Mult Living                  Review of Systems  Gastrointestinal: Positive for abdominal pain and diarrhea.  All other systems reviewed and are negative.    Allergies  Oxycodone and Sulfa antibiotics  Home Medications   Current Outpatient Rx  Name  Route  Sig  Dispense  Refill  . AMOXICILLIN 500 MG PO CAPS   Oral   Take 1 capsule (500 mg total) by mouth 3 (three) times daily.   21 capsule   0   . DOXYCYCLINE HYCLATE 100 MG PO CAPS   Oral   Take 1 capsule (100 mg total) by  mouth 2 (two) times daily.   14 capsule   0   . HYDROCODONE-ACETAMINOPHEN 7.5-325 MG PO TABS   Oral   Take 1 tablet by mouth every 4 (four) hours as needed for pain.   20 tablet   0   . LORAZEPAM 1 MG PO TABS   Oral   Take 1 mg by mouth every 8 (eight) hours.         Marland Kitchen NAPROXEN SODIUM 220 MG PO CAPS   Oral   Take 1 capsule by mouth daily as needed. For pain         . PROMETHAZINE HCL 12.5 MG PO TABS   Oral   Take 12.5 mg by mouth every 6 (six) hours as needed. For nausea         . PROMETHAZINE HCL 25 MG PO TABS   Oral   Take 1 tablet (25 mg total) by mouth every 6 (six) hours as needed for nausea.   30 tablet   0     BP  94/73  Pulse 117  Temp 97.8 F (36.6 C) (Oral)  Resp 20  Ht 5\' 1"  (1.549 m)  Wt 105 lb (47.628 kg)  BMI 19.84 kg/m2  SpO2 100%  LMP 04/08/2012  Physical Exam  Nursing note and vitals reviewed. Constitutional: She is oriented to person, place, and time. She appears well-developed and well-nourished. No distress.  HENT:  Head: Normocephalic and atraumatic.  Eyes: EOM are normal.  Neck: Normal range of motion.  Cardiovascular: Normal rate, regular rhythm and normal heart sounds.   Pulmonary/Chest: Effort normal and breath sounds normal.  Abdominal: Soft. She exhibits no distension. There is no tenderness.  Genitourinary:       All external genitalia.  No cervical motion tenderness.  No cervical discharge.  No adnexal masses or fullness  Musculoskeletal: Normal range of motion.  Neurological: She is alert and oriented to person, place, and time.  Skin: Skin is warm and dry.  Psychiatric: She has a normal mood and affect. Judgment normal.    ED Course  Procedures (including critical care time)  Labs Reviewed  BASIC METABOLIC PANEL - Abnormal; Notable for the following:    Glucose, Bld 123 (*)     BUN 5 (*)     Creatinine, Ser 0.39 (*)     All other components within normal limits  WET PREP, GENITAL - Abnormal; Notable for the  following:    WBC, Wet Prep HPF POC RARE (*)     All other components within normal limits  CBC  HCG, SERUM, QUALITATIVE  GC/CHLAMYDIA PROBE AMP   No results found.   1. Nausea vomiting and diarrhea       MDM  1:54 AM  Pt feels better, this is likely viral gastro. Dc home in good conditioni       Lyanne Co, MD 06/04/12 [phone]

## 2012-06-04 NOTE — ED Notes (Signed)
Pt discharged. Pt stable at time of discharge. Medications reviewed pt has no questions regarding discharge at this time. Pt voiced understanding of discharge instructions.  

## 2012-06-05 LAB — GC/CHLAMYDIA PROBE AMP: CT Probe RNA: NEGATIVE

## 2012-06-27 ENCOUNTER — Emergency Department (HOSPITAL_COMMUNITY)
Admission: EM | Admit: 2012-06-27 | Discharge: 2012-06-28 | Disposition: A | Discharge status: Other Acute Inpatient Hospital | Payer: Self-pay | Attending: Emergency Medicine | Admitting: Emergency Medicine

## 2012-06-27 ENCOUNTER — Emergency Department (HOSPITAL_COMMUNITY): Payer: Self-pay

## 2012-06-27 ENCOUNTER — Encounter (HOSPITAL_COMMUNITY): Payer: Self-pay

## 2012-06-27 DIAGNOSIS — T39091A Poisoning by salicylates, accidental (unintentional), initial encounter: Secondary | ICD-10-CM

## 2012-06-27 DIAGNOSIS — E876 Hypokalemia: Secondary | ICD-10-CM | POA: Insufficient documentation

## 2012-06-27 DIAGNOSIS — R110 Nausea: Secondary | ICD-10-CM | POA: Insufficient documentation

## 2012-06-27 DIAGNOSIS — Z8619 Personal history of other infectious and parasitic diseases: Secondary | ICD-10-CM | POA: Insufficient documentation

## 2012-06-27 DIAGNOSIS — L408 Other psoriasis: Secondary | ICD-10-CM | POA: Insufficient documentation

## 2012-06-27 DIAGNOSIS — N3010 Interstitial cystitis (chronic) without hematuria: Secondary | ICD-10-CM | POA: Insufficient documentation

## 2012-06-27 DIAGNOSIS — F17200 Nicotine dependence, unspecified, uncomplicated: Secondary | ICD-10-CM | POA: Insufficient documentation

## 2012-06-27 DIAGNOSIS — F3289 Other specified depressive episodes: Secondary | ICD-10-CM | POA: Insufficient documentation

## 2012-06-27 DIAGNOSIS — Z79899 Other long term (current) drug therapy: Secondary | ICD-10-CM | POA: Insufficient documentation

## 2012-06-27 DIAGNOSIS — Z3202 Encounter for pregnancy test, result negative: Secondary | ICD-10-CM | POA: Insufficient documentation

## 2012-06-27 DIAGNOSIS — E872 Acidosis, unspecified: Secondary | ICD-10-CM | POA: Insufficient documentation

## 2012-06-27 DIAGNOSIS — T394X2A Poisoning by antirheumatics, not elsewhere classified, intentional self-harm, initial encounter: Secondary | ICD-10-CM | POA: Insufficient documentation

## 2012-06-27 DIAGNOSIS — Z8659 Personal history of other mental and behavioral disorders: Secondary | ICD-10-CM | POA: Insufficient documentation

## 2012-06-27 DIAGNOSIS — F329 Major depressive disorder, single episode, unspecified: Secondary | ICD-10-CM

## 2012-06-27 LAB — CBC WITH DIFFERENTIAL/PLATELET
Hemoglobin: 16.2 g/dL — ABNORMAL HIGH (ref 12.0–15.0)
Lymphocytes Relative: 17 % (ref 12–46)
Lymphs Abs: 1.5 10*3/uL (ref 0.7–4.0)
MCH: 34 pg (ref 26.0–34.0)
MCV: 93.9 fL (ref 78.0–100.0)
Monocytes Relative: 10 % (ref 3–12)
Neutrophils Relative %: 66 % (ref 43–77)
Platelets: 399 10*3/uL (ref 150–400)
RBC: 4.76 MIL/uL (ref 3.87–5.11)
WBC: 8.8 10*3/uL (ref 4.0–10.5)

## 2012-06-27 LAB — BASIC METABOLIC PANEL
BUN: 4 mg/dL — ABNORMAL LOW (ref 6–23)
CO2: 16 mEq/L — ABNORMAL LOW (ref 19–32)
Glucose, Bld: 117 mg/dL — ABNORMAL HIGH (ref 70–99)
Potassium: 2.8 mEq/L — ABNORMAL LOW (ref 3.5–5.1)
Sodium: 142 mEq/L (ref 135–145)

## 2012-06-27 LAB — URINALYSIS, ROUTINE W REFLEX MICROSCOPIC
Bilirubin Urine: NEGATIVE
Specific Gravity, Urine: <1.005 — ABNORMAL LOW (ref 1.005–1.030)
pH: 5.5 (ref 5.0–8.0)

## 2012-06-27 LAB — HEPATIC FUNCTION PANEL
ALT: 27 U/L (ref 0–35)
Albumin: 4.1 g/dL (ref 3.5–5.2)
Alkaline Phosphatase: 98 U/L (ref 39–117)
Total Protein: 8.1 g/dL (ref 6.0–8.3)

## 2012-06-27 LAB — RAPID URINE DRUG SCREEN, HOSP PERFORMED
Barbiturates: NOT DETECTED
Benzodiazepines: NOT DETECTED
Cocaine: NOT DETECTED
Tetrahydrocannabinol: NOT DETECTED

## 2012-06-27 LAB — URINE MICROSCOPIC-ADD ON

## 2012-06-27 LAB — ACETAMINOPHEN LEVEL: Acetaminophen (Tylenol), Serum: 29.2 ug/mL (ref 10–30)

## 2012-06-27 MED ORDER — NICOTINE 14 MG/24HR TD PT24
14.0000 mg | MEDICATED_PATCH | Freq: Once | TRANSDERMAL | Status: DC
  Administered 2012-06-27: 14 mg via TRANSDERMAL
  Filled 2012-06-27: qty 1

## 2012-06-27 MED ORDER — ONDANSETRON HCL 4 MG/2ML IJ SOLN
4.0000 mg | Freq: Once | INTRAMUSCULAR | Status: AC
  Administered 2012-06-27: 4 mg via INTRAVENOUS
  Filled 2012-06-27: qty 2

## 2012-06-27 MED ORDER — SODIUM CHLORIDE 0.9 % IV SOLN
1000.0000 mL | Freq: Once | INTRAVENOUS | Status: AC
  Administered 2012-06-27: 1000 mL via INTRAVENOUS

## 2012-06-27 MED ORDER — CHARCOAL ACTIVATED PO LIQD
50.0000 g | Freq: Once | ORAL | Status: AC
  Administered 2012-06-27: 50 g via ORAL
  Filled 2012-06-27: qty 240

## 2012-06-27 MED ORDER — POTASSIUM CHLORIDE 10 MEQ/100ML IV SOLN
10.0000 meq | INTRAVENOUS | Status: AC
  Administered 2012-06-27 (×4): 10 meq via INTRAVENOUS
  Filled 2012-06-27 (×4): qty 100

## 2012-06-27 MED ORDER — LORAZEPAM 2 MG/ML IJ SOLN
1.0000 mg | Freq: Once | INTRAMUSCULAR | Status: AC
  Administered 2012-06-27: 1 mg via INTRAVENOUS
  Filled 2012-06-27: qty 1

## 2012-06-27 MED ORDER — SODIUM CHLORIDE 0.9 % IV SOLN
1000.0000 mL | INTRAVENOUS | Status: DC
  Administered 2012-06-27: 1000 mL via INTRAVENOUS

## 2012-06-27 NOTE — ED Provider Notes (Signed)
History    This chart was scribed for Ward Givens, MD, MD by Smitty Pluck, ED Scribe. The patient was seen in room [facility]8/AP[facility]8 and the patient's care was started at 5:04 PM.  CSN: [id number]  Arrival date & time 06/27/12  1527      Chief Complaint  Patient presents with  . V70.1    (Consider location/radiation/quality/duration/timing/severity/associated sxs/prior treatment) Patient is a 42 y.o. female presenting with Overdose. The history is provided by the patient. No language interpreter was used.  Drug Overdose This is a new problem. The current episode started 3 to 5 hours ago. The problem occurs rarely. The problem has not changed since onset.Pertinent negatives include no chest pain and no abdominal pain. Nothing aggravates the symptoms. Nothing relieves the symptoms.   Monique Nguyen is a 42 y.o. female who presents to the Emergency Department BIB EMS complaining of overdose of Migraine Relief medication today 4 hours ago. Each tablet contains acetametaphin (250 mg), aspirin (250 mg) and caffiene (65 mg). The bottle contained 24 pills and there are 7 pills left in the bottle. Pt reports that she took 15 pills at once (14 are missing). She states that she took the pills because "she wanted the pain to go away." She states "it is hard to explain why she took the pills." Per nurse pt reports she took the pills because she was depressed due to her husband passing. She states she was drinking wine before she took the pills. She denies vomiting, chest pain, abdominal pain. Reports that she has nausea, mild SOB and anxiety. Pt reports that she took ativan overdose 6 months ago. Pt reports that she was at [facility] a couple of months ago but did not have money to follow up .She takes Cellexa  and ativan for depression. She states she did not take any extra medication today. She drinks wine daily (4 glasses) and smokes cigarettes. She denies using illegal drugs. She lives with her son (56 years  old) and her boyfriend.   PCP is Dr. Sherwood Gambler or Dr. Phillips Odor at [facility].   Past Medical History  Diagnosis Date  . GERD (gastroesophageal reflux disease)   . Interstitial cystitis   . Bacterial vaginosis   . Anxiety   . Depression   . Panic attacks   . Psoriasis     Past Surgical History  Procedure Date  . Bladder surgery     X 3    Family History  Problem Relation Age of Onset  . Depression Mother     living  . Pancreatic cancer Father     living    History  Substance Use Topics  . Smoking status: Current Every Day Smoker -- 1.5 packs/day    Types: Cigarettes  . Smokeless tobacco: Not on file  . Alcohol Use: 1.8 oz/week    3 Cans of beer per week   Lives at home Lives with boyfriend and her 90 yo son Widowed, gets widows benefits  Drinks 4 1/2 wine glasses of wine a day  OB History    Grav Para Term Preterm Abortions TAB SAB Ect Mult Living                  Review of Systems  Cardiovascular: Negative for chest pain.  Gastrointestinal: Positive for nausea. Negative for vomiting and abdominal pain.  All other systems reviewed and are negative.    Allergies  Oxycodone and Sulfa antibiotics  Home Medications   Current Outpatient Rx  Name  Route  Sig  Dispense  Refill  . AMOXICILLIN 500 MG PO CAPS   Oral   Take 1 capsule (500 mg total) by mouth 3 (three) times daily.   21 capsule   0   . DOXYCYCLINE HYCLATE 100 MG PO CAPS   Oral   Take 1 capsule (100 mg total) by mouth 2 (two) times daily.   14 capsule   0   . LORAZEPAM 1 MG PO TABS   Oral   Take 1 mg by mouth every 8 (eight) hours.         Marland Kitchen NAPROXEN SODIUM 220 MG PO CAPS   Oral   Take 1 capsule by mouth daily as needed. For pain         . PROMETHAZINE HCL 12.5 MG PO TABS   Oral   Take 12.5 mg by mouth every 6 (six) hours as needed. For nausea         . PROMETHAZINE HCL 25 MG PO TABS   Oral   Take 1 tablet (25 mg total) by mouth every 6 (six) hours as needed for nausea.   30  tablet   0     BP 107/78  Pulse 121  Temp 98 F (36.7 C) (Oral)  Resp 18  SpO2 97%  Vital signs normal except tachycardia   Physical Exam  Nursing note and vitals reviewed. Constitutional: She is oriented to person, place, and time. She appears well-developed and well-nourished.  Non-toxic appearance. She does not appear ill. No distress.       obese  HENT:  Head: Normocephalic and atraumatic.  Right Ear: External ear normal.  Left Ear: External ear normal.  Nose: Nose normal. No mucosal edema or rhinorrhea.  Mouth/Throat: Oropharynx is clear and moist and mucous membranes are normal. No dental abscesses or uvula swelling.  Eyes: Conjunctivae normal and EOM are normal. Pupils are equal, round, and reactive to light.  Neck: Normal range of motion and full passive range of motion without pain. Neck supple.  Cardiovascular: Normal rate, regular rhythm and normal heart sounds.  Exam reveals no gallop and no friction rub.   No murmur heard. Pulmonary/Chest: Effort normal and breath sounds normal. No respiratory distress. She has no wheezes. She has no rhonchi. She has no rales. She exhibits no tenderness and no crepitus.  Abdominal: Soft. Normal appearance and bowel sounds are normal. She exhibits no distension. There is no tenderness. There is no rebound and no guarding.  Musculoskeletal: Normal range of motion. She exhibits no edema and no tenderness.       Moves all extremities well.   Neurological: She is alert and oriented to person, place, and time. She has normal strength. No cranial nerve deficit.  Skin: Skin is warm, dry and intact. No rash noted. No erythema. No pallor.  Psychiatric: She has a normal mood and affect. Her speech is normal and behavior is normal. Her mood appears not anxious.    ED Course  Procedures (including critical care time)   Medications  0.9 %  sodium chloride infusion (0 mL Intravenous Stopped 06/27/12 2224)    Followed by  0.9 %  sodium  chloride infusion (1000 mL Intravenous New Bag/Given 06/27/12 1743)  aspirin-acetaminophen-caffeine (EXCEDRIN MIGRAINE) 250-250-65 MG per tablet (not administered)  nicotine (NICODERM CQ - dosed in mg/24 hours) patch 14 mg (14 mg Transdermal Patch Applied 06/27/12 2212)  potassium chloride 10 mEq in 100 mL IVPB (0 mEq Intravenous Stopped 06/28/12 0010)  charcoal activated (NO SORBITOL) (ACTIDOSE-AQUA) suspension 50 g (50 g Oral Given 06/27/12 1754)  ondansetron (ZOFRAN) injection 4 mg (4 mg Intravenous Given 06/27/12 1754)  LORazepam (ATIVAN) injection 1 mg (1 mg Intravenous Given 06/27/12 2142)    DIAGNOSTIC STUDIES: Oxygen Saturation is 97% on room air, adequate by my interpretation.    COORDINATION OF CARE: 5:18 PM Discussed ED treatment with pt   6:00 PM Recheck. Discussed pt lab results with pt. Ordering hepatic function panel.    1747 Deborah at poison control.Agrees with charcoal 1 g per kilogram. She states that if the aspirin level is greater than 40 the charcoal can be repeated 1/2 g per kilogram every 4 hours for 3 more doses. Nursing staff to check for bowel sounds before giving her charcoal   Repeat ASA and acetaminophen level shows they are decreasing after getting the charcoal.   20:03 Deborah at [employer], states once K and bicarb is normal is medically cleared. She is now cleared from her aspirin overdose.   Samson Frederic, ACT has seen patient, informed not medically cleared yet for psychiatric admission. States patient drinks 1/2 case beer a day or a bottle of wine a day.   00:49 repeat BMET shows hypokalemia and low bicarb have been corrected, pt now medically cleared for psychiatric admission. Samson Frederic, ACT has already submitted her paperwork to [facility].    Results for orders placed during the hospital encounter of 06/27/12  CBC WITH DIFFERENTIAL      Component Value Range   WBC 8.8  4.0 - 10.5 K/uL   RBC 4.76  3.87 - 5.11 MIL/uL   Hemoglobin 16.2 (*) 12.0 - 15.0 g/dL   HCT 16.1   09.6 - 04.5 %   MCV 93.9  78.0 - 100.0 fL   MCH 34.0  26.0 - 34.0 pg   MCHC 36.2 (*) 30.0 - 36.0 g/dL   RDW 40.9  81.1 - 91.4 %   Platelets 399  150 - 400 K/uL   Neutrophils Relative 66  43 - 77 %   Neutro Abs 5.8  1.7 - 7.7 K/uL   Lymphocytes Relative 17  12 - 46 %   Lymphs Abs 1.5  0.7 - 4.0 K/uL   Monocytes Relative 10  3 - 12 %   Monocytes Absolute 0.9  0.1 - 1.0 K/uL   Eosinophils Relative 6 (*) 0 - 5 %   Eosinophils Absolute 0.5  0.0 - 0.7 K/uL   Basophils Relative 1  0 - 1 %   Basophils Absolute 0.0  0.0 - 0.1 K/uL  BASIC METABOLIC PANEL      Component Value Range   Sodium 142  135 - 145 mEq/L   Potassium 2.8 (*) 3.5 - 5.1 mEq/L   Chloride 105  96 - 112 mEq/L   CO2 16 (*) 19 - 32 mEq/L   Glucose, Bld 117 (*) 70 - 99 mg/dL   BUN 4 (*) 6 - 23 mg/dL   Creatinine, Ser 7.82 (*) 0.50 - 1.10 mg/dL   Calcium 9.1  8.4 - 95.6 mg/dL   GFR calc non Af Amer >90  >90 mL/min   GFR calc Af Amer >90  >90 mL/min  ETHANOL      Component Value Range   Alcohol, Ethyl (B) 56 (*) 0 - 11 mg/dL  URINALYSIS, ROUTINE W REFLEX MICROSCOPIC      Component Value Range   Color, Urine YELLOW  YELLOW   APPearance CLEAR  CLEAR   Specific Gravity, Urine <  1.005 (*) 1.005 - 1.030   pH 5.5  5.0 - 8.0   Glucose, UA NEGATIVE  NEGATIVE mg/dL   Hgb urine dipstick SMALL (*) NEGATIVE   Bilirubin Urine NEGATIVE  NEGATIVE   Ketones, ur NEGATIVE  NEGATIVE mg/dL   Protein, ur NEGATIVE  NEGATIVE mg/dL   Urobilinogen, UA 0.2  0.0 - 1.0 mg/dL   Nitrite NEGATIVE  NEGATIVE   Leukocytes, UA NEGATIVE  NEGATIVE  URINE RAPID DRUG SCREEN (HOSP PERFORMED)      Component Value Range   Opiates NONE DETECTED  NONE DETECTED   Cocaine NONE DETECTED  NONE DETECTED   Benzodiazepines NONE DETECTED  NONE DETECTED   Amphetamines NONE DETECTED  NONE DETECTED   Tetrahydrocannabinol NONE DETECTED  NONE DETECTED   Barbiturates NONE DETECTED  NONE DETECTED  PREGNANCY, URINE      Component Value Range   Preg Test, Ur NEGATIVE   NEGATIVE  URINE MICROSCOPIC-ADD ON      Component Value Range   Squamous Epithelial / LPF RARE  RARE   WBC, UA 0-2  <3 WBC/hpf   RBC / HPF 0-2  <3 RBC/hpf  ACETAMINOPHEN LEVEL      Component Value Range   Acetaminophen (Tylenol), Serum 75.7 (*) 10 - 30 ug/mL  SALICYLATE LEVEL      Component Value Range   Salicylate Lvl 34.8 (*) 2.8 - 20.0 mg/dL  HEPATIC FUNCTION PANEL      Component Value Range   Total Protein 8.1  6.0 - 8.3 g/dL   Albumin 4.1  3.5 - 5.2 g/dL   AST 29  0 - 37 U/L   ALT 27  0 - 35 U/L   Alkaline Phosphatase 98  39 - 117 U/L   Total Bilirubin 0.1 (*) 0.3 - 1.2 mg/dL   Bilirubin, Direct 0.1  0.0 - 0.3 mg/dL   Indirect Bilirubin 0.0 (*) 0.3 - 0.9 mg/dL  ACETAMINOPHEN LEVEL      Component Value Range   Acetaminophen (Tylenol), Serum 29.2  10 - 30 ug/mL  SALICYLATE LEVEL      Component Value Range   Salicylate Lvl 31.9 (*) 2.8 - 20.0 mg/dL  BASIC METABOLIC PANEL      Component Value Range   Sodium 140  135 - 145 mEq/L   Potassium 3.7  3.5 - 5.1 mEq/L   Chloride 110  96 - 112 mEq/L   CO2 20  19 - 32 mEq/L   Glucose, Bld 121 (*) 70 - 99 mg/dL   BUN 2 (*) 6 - 23 mg/dL   Creatinine, Ser 9.60 (*) 0.50 - 1.10 mg/dL   Calcium 7.9 (*) 8.4 - 10.5 mg/dL   GFR calc non Af Amer >90  >90 mL/min   GFR calc Af Amer >90  >90 mL/min    Laboratory interpretation all normal except for hypokalemia, new metabolic acidosis   Dg Chest 2 View  06/27/2012  *RADIOLOGY REPORT*  Clinical Data: Evaluate for commitment.  CHEST - 2 VIEW  Comparison: 01/25/2011.  Findings: Trachea is midline.  Heart size normal.  Lungs are clear. No pleural fluid.  IMPRESSION: No acute findings.   Original Report Authenticated By: Leanna Battles, M.D.      Date: 06/27/2012  Rate: 121  Rhythm: sinus tachycardia  QRS Axis: normal  Intervals: normal  ST/T Wave abnormalities: nonspecific T wave changes  Conduction Disutrbances:none  Narrative Interpretation:   Old EKG Reviewed: unchanged from  04/10/2012 HR was 110    1. Depression  2. Salicylate overdose   3. Metabolic acidosis   4. Hypokalemia     Plan admission to psychiatric hospital   Devoria Albe, MD, FACEP    CRITICAL CARE Performed by: Devoria Albe L   Total critical care time: 48 minutes   Critical care time was exclusive of separately billable procedures and treating other patients.  Critical care was necessary to treat or prevent imminent or life-threatening deterioration.  Critical care was time spent personally by me on the following activities: development of treatment plan with patient and/or surrogate as well as nursing, discussions with consultants, evaluation of patient's response to treatment, examination of patient, obtaining history from patient or surrogate, ordering and performing treatments and interventions, ordering and review of laboratory studies, ordering and review of radiographic studies, pulse oximetry and re-evaluation of patient's condition.   MDM    I personally performed the services described in this documentation, which was scribed in my presence. The recorded information has been reviewed and considered.  Devoria Albe, MD, FACEP    Ward Givens, MD 06/28/12 [phone]

## 2012-06-27 NOTE — ED Notes (Signed)
Poison Control called back and advised of salicylate level of 34.8 mg/dL and advised not to start Bicarb drip unless level is over 40 mg/dL. Repeat salicylate every 3 hrs until descending.

## 2012-06-27 NOTE — BH Assessment (Addendum)
Assessment Note   Monique Nguyen is an 42 y.o. female. PT PRESENTED TO THE ER 4 HOURS AFTER OVERDOSING ON APPROXIMATELY 15 MIGRAINE RELIEF MEDICATION. PT REPORTS ALSO DRINKING WINE TODAY.  PT REPORTS SHE HAS BEEN SUFFERING WITH DEPRESSION SINCE THE DEATH OF HER HUSBAND ALMOST 3 YEARS AGO.  HE DIED OF BRAIN CANCER AND SHE WAS HIS CAREGIVER EVEN HAVING TO GIVE HIM CPR.  THEY HAD BEEN TOGETHER OVER 20 YEARS.  SHE HAD NEVER GOTTEN OVER HIS DEATH AND 5 MONTHS AGO HER BOYFRIEND'S FATHER DIED WHICH TRIGGERED HER GRIEF AND DEPRESSION. SHE OVERDOSED ON  BEER, 10 CELEXA 40 MG, 30 KLONIPIN 2 MG TO GO TO SLEEP AND NOT WAKE UP AND TOLD THE ER PHYSICIAN TO LET HER DIE.  PT EVENTUALLY CONTINUED TO DENY BEING SUICIDAL AND THIS WAS HER WAY OF GRIEVING AND CONTRACTED FOR SAFETY AND DID FOLLOW-UP WITH [facility].  SHE DISCONTINUED HER VISITS TO [facility] WHEN THE PSYCHIATRIST SUGGESTED SHE DISCONTINUE HER KLONOPIN AND DRINKING ALL AT ONCE.  SHE NEVER RETURNED. SINCE THAT TIME PT HAS DECOMPENSATED HAS GOTTEN A DUI AND FAILURE TO APPEAR CHARGE.  SHE HAS SPENT MOST DAYS WITH POOR HYGIENE,NOT GETTING OUT OF BED OR LAYING ON THE COUCH ALL DAY, NO HOUSE CLEANING, AND ISOLATION.  SHE HAS BECOME UNABLE TO FUNCTION.  SHE HAS HAD A 10 POUND WEIGHT INCREASE IN 1 YEAR YET HAS A POOR APPETITE.  SHE WAS AT ONE POINT DRINKING A CASE OF BEER EVERY 3 DAYS AND IS CURRENTLY DRINKING A BOTTLE OF WINE DAILY AND  WILL OCCASIONALLY DRINK A BEER.  PT WANTS TO STOP DRINKING. SHE HAD NEVER DRANK UNTIL THE DEATH OF HER HUSBAND.  HER LIVE IN BOYFRIEND IS SUPPORTIVE BUT PT'S REPORTS HER DEPRESSION CONTINUES TO GET WORSE. PT IS NOT SAFE TO RETURN HOME WITH MENTAL HEALTH INTERVENTION. PT AGREES TO GET HELP.  PT DENIES H/I AND IS NOT PSYCHOTIC.      Axis I: Major Depression, Recurrent severe Axis II: Deferred Axis III:  Past Medical History  Diagnosis Date  . GERD (gastroesophageal reflux disease)   . Interstitial cystitis   . Bacterial vaginosis   .  Anxiety   . Depression   . Panic attacks   . Psoriasis    Axis IV: problems related to legal system/crime and problems related to social environment Axis V: 11-20 some danger of hurting self or others possible OR occasionally fails to maintain minimal personal hygiene OR gross impairment in communication       Past Medical History:  Past Medical History  Diagnosis Date  . GERD (gastroesophageal reflux disease)   . Interstitial cystitis   . Bacterial vaginosis   . Anxiety   . Depression   . Panic attacks   . Psoriasis     Past Surgical History  Procedure Date  . Bladder surgery     X 3    Family History:  Family History  Problem Relation Age of Onset  . Depression Mother     living  . Pancreatic cancer Father     living    Social History:  reports that she has been smoking Cigarettes.  She has been smoking about 1.5 packs per day. She does not have any smokeless tobacco history on file. She reports that she drinks about 1.8 ounces of alcohol per week. She reports that she uses illicit drugs (Marijuana).  Additional Social History:  Alcohol / Drug Use Pain Medications: na Prescriptions: na Over the Counter: cough medication to help  with sleep History of alcohol / drug use?: Yes Substance #1 Name of Substance 1: alcohol 1 - Age of First Use: 39 1 - Amount (size/oz): 1 case of beer every 3 days now drinking 1 bottle of wine 1 - Frequency: daily 1 - Duration: 5 mos 1 - Last Use / Amount: 10/24/12 Substance #2 Name of Substance 2: OTC   NYQUIL COUGH SYRUP 2 - Age of First Use: 39 2 - Amount (size/oz): 1/3 BOTTLE  2 - Frequency: NIGHTLY 2 - Duration: 2 WEEKS TO HELOP WITH SLEEP 2 - Last Use / Amount: 06/26/12  CIWA: CIWA-Ar BP: 133/98 mmHg Pulse Rate: 111  Nausea and Vomiting: mild nausea with no vomiting Tactile Disturbances: none Tremor: not visible, but can be felt fingertip to fingertip Auditory Disturbances: not present Paroxysmal Sweats: barely  perceptible sweating, palms moist Visual Disturbances: very mild sensitivity Anxiety: two Headache, Fullness in Head: very mild Agitation: somewhat more than normal activity Orientation and Clouding of Sensorium: oriented and can do serial additions CIWA-Ar Total: 8  COWS:    Allergies:  Allergies  Allergen Reactions  . Oxycodone Other (See Comments)    headache  . Sulfa Antibiotics Nausea And Vomiting and Other (See Comments)    Blurred vision and headaches    Home Medications:  (Not in a hospital admission)  OB/GYN Status:  No LMP recorded. Patient is not currently having periods (Reason: Perimenopausal).  General Assessment Data Location of Assessment: [facility] ACT Assessment: Yes Living Arrangements: Spouse/significant other (AND 24 YR OLD AUSTIC SON) Can pt return to current living arrangement?: Yes Admission Status: Voluntary Is patient capable of signing voluntary admission?: Yes Transfer from: Acute Hospital [facility]) Referral Source: MD (DR IVA KNAPP)  Education Status Contact person: TIMOTHY BURCHELL-BOYFRIEND-[phone]  Risk to self Suicidal Ideation: Yes-Currently Present Suicidal Intent: Yes-Currently Present Is patient at risk for suicide?: Yes Suicidal Plan?: Yes-Currently Present Specify Current Suicidal Plan: OVERDOSE Access to Means: Yes Specify Access to Suicidal Means: OVERDOSED ON APPROX 15 EXCEDERINE PILLS What has been your use of drugs/alcohol within the last 12 months?: BEER, WINE OTC NYQUIL Previous Attempts/Gestures: Yes How many times?: 1  Other Self Harm Risks: NA Triggers for Past Attempts: Other (Comment) (DEATHOF HUSBAND 2 YRS AGO AND DEATH OF BF'S FATHER 5 MOS AGO) Intentional Self Injurious Behavior: Cutting Comment - Self Injurious Behavior: CUTTING IN THE PAST Family Suicide History: No Recent stressful life event(s): Legal Issues Persecutory voices/beliefs?: No Depression: Yes Depression Symptoms:  Despondent;Insomnia;Tearfulness;Isolating;Fatigue;Guilt;Loss of interest in usual pleasures;Feeling worthless/self pity;Feeling angry/irritable Substance abuse history and/or treatment for substance abuse?: Yes Suicide prevention information given to non-admitted patients: Not applicable  Risk to Others Homicidal Ideation: No Thoughts of Harm to Others: No Current Homicidal Intent: No Current Homicidal Plan: No Access to Homicidal Means: No History of harm to others?: No Assessment of Violence: None Noted Does patient have access to weapons?: No Criminal Charges Pending?: Yes Describe Pending Criminal Charges: DUI Does patient have a court date: Yes Court Date:  (UNK)  Psychosis Hallucinations: None noted Delusions: None noted  Mental Status Report Appear/Hygiene: Disheveled Eye Contact: Fair Motor Activity: Freedom of movement;Restlessness Speech: Logical/coherent;Pressured Level of Consciousness: Alert;Restless;Crying Mood: Depressed;Despair;Ashamed/humiliated;Fearful;Helpless;Irritable Affect: Appropriate to circumstance;Depressed;Blunted;Frightened;Irritable;Sad Anxiety Level: Moderate Thought Processes: Coherent;Relevant Judgement: Impaired Orientation: Person;Place;Time;Situation Obsessive Compulsive Thoughts/Behaviors: None  Cognitive Functioning Concentration: Normal Memory: Remote Intact;Recent Intact IQ: Average Insight: Good Impulse Control: Good Appetite: Good Weight Loss: 10  Weight Gain: 0  Sleep: Decreased Total Hours of Sleep: 0  (UNLESS  TAKING NYQUIL ) Vegetative Symptoms: Staying in bed;Not bathing;Decreased grooming  ADLScreening [facility] Assessment Services) Patient's cognitive ability adequate to safely complete daily activities?: Yes Patient able to express need for assistance with ADLs?: Yes Independently performs ADLs?: Yes (appropriate for developmental age)  Abuse/Neglect [facility]) Physical Abuse: Denies Verbal Abuse: Denies Sexual Abuse:  Denies  Prior Inpatient Therapy Prior Inpatient Therapy: No Prior Therapy Dates: NA Prior Therapy Facilty/Provider(s): NA Reason for Treatment: NA  Prior Outpatient Therapy Prior Outpatient Therapy: Yes Prior Therapy Dates: 2013 Prior Therapy Facilty[facility]Provider[facility] Reason for Treatment: DEPRESSION GRIEF  ADL Screening (condition at time of admission) Patient's cognitive ability adequate to safely complete daily activities?: Yes Patient able to express need for assistance with ADLs?: Yes Independently performs ADLs?: Yes (appropriate for developmental age) Weakness of Legs: None Weakness of Arms/Hands: None  Home Assistive Devices/Equipment Home Assistive Devices/Equipment: None  Therapy Consults (therapy consults require a physician order) PT Evaluation Needed: No OT Evalulation Needed: No SLP Evaluation Needed: No Abuse/Neglect Assessment (Assessment to be complete while patient is alone) Physical Abuse: Denies Verbal Abuse: Denies Sexual Abuse: Denies Exploitation of patient/patient's resources: Denies Self-Neglect: Denies Values / Beliefs Cultural Requests During Hospitalization: None Spiritual Requests During Hospitalization: None Consults Spiritual Care Consult Needed: No Social Work Consult Needed: No Merchant navy officer (For Healthcare) Advance Directive: Patient does not have advance directive;Patient would not like information Pre-existing out of facility DNR order (yellow form or pink MOST form): No    Additional Information 1:1 In Past 12 Months?: No CIRT Risk: No Elopement Risk: No Does patient have medical clearance?: Yes     Disposition: REFERRED TO [facility] Disposition Disposition of Patient: Inpatient treatment program Type of inpatient treatment program: Adult  On Site Evaluation by:   Reviewed with Physician:  DR IVA Clarene Duke Winford 06/27/2012 9:40 PM

## 2012-06-27 NOTE — ED Notes (Signed)
Pt states she took the pills because she was depressed. States she took care of her late husband until he died and even gave him CPR. States she took 20 migraine tablets about an hour and a half ago.

## 2012-06-27 NOTE — ED Notes (Signed)
CRITICAL VALUE ALERT  Critical value received: Salicylate 31.9  Date of notification:  06/27/2012  Time of notification:  1950  Critical value read back: Yes  Nurse who received alert:  [id number]  MD notified. Dr. Lynelle Doctor

## 2012-06-27 NOTE — ED Notes (Addendum)
Pt arrived via EMS secondary to Overdose of 20 migraine relief tablets approximately 1.5 hrs ago. Pt reports being sad and upset. C/o being light headed now. 7 tablets left in bottle of 24 migraine relief tablets. Bottle given to RN by EMS.

## 2012-06-28 ENCOUNTER — Inpatient Hospital Stay (HOSPITAL_COMMUNITY)
Admission: EM | Admit: 2012-06-28 | Discharge: 2012-07-01 | DRG: 897 | Disposition: A | Discharge status: Home / Self Care | Payer: Federal, State, Local not specified - Other | Source: Ambulatory Visit | Attending: Psychiatry | Admitting: Psychiatry

## 2012-06-28 ENCOUNTER — Encounter (HOSPITAL_COMMUNITY): Payer: Self-pay | Admitting: *Deleted

## 2012-06-28 DIAGNOSIS — R45851 Suicidal ideations: Secondary | ICD-10-CM | POA: Diagnosis present

## 2012-06-28 DIAGNOSIS — F3289 Other specified depressive episodes: Secondary | ICD-10-CM

## 2012-06-28 DIAGNOSIS — F329 Major depressive disorder, single episode, unspecified: Secondary | ICD-10-CM

## 2012-06-28 DIAGNOSIS — F4321 Adjustment disorder with depressed mood: Secondary | ICD-10-CM | POA: Diagnosis present

## 2012-06-28 DIAGNOSIS — F1020 Alcohol dependence, uncomplicated: Principal | ICD-10-CM | POA: Diagnosis present

## 2012-06-28 DIAGNOSIS — Z79899 Other long term (current) drug therapy: Secondary | ICD-10-CM

## 2012-06-28 DIAGNOSIS — F1310 Sedative, hypnotic or anxiolytic abuse, uncomplicated: Secondary | ICD-10-CM | POA: Diagnosis present

## 2012-06-28 DIAGNOSIS — F1010 Alcohol abuse, uncomplicated: Secondary | ICD-10-CM

## 2012-06-28 DIAGNOSIS — F4329 Adjustment disorder with other symptoms: Secondary | ICD-10-CM

## 2012-06-28 LAB — BASIC METABOLIC PANEL
BUN: 2 mg/dL — ABNORMAL LOW (ref 6–23)
Chloride: 110 mEq/L (ref 96–112)
GFR calc Af Amer: >90 mL/min (ref >90)
Potassium: 3.7 mEq/L (ref 3.5–5.1)

## 2012-06-28 LAB — T4, FREE: Free T4: 0.9 ng/dL (ref 0.80–1.80)

## 2012-06-28 LAB — TSH: TSH: 2.865 u[IU]/mL (ref 0.350–4.500)

## 2012-06-28 MED ORDER — CHLORDIAZEPOXIDE HCL 25 MG PO CAPS
25.0000 mg | ORAL_CAPSULE | Freq: Four times a day (QID) | ORAL | Status: AC
  Administered 2012-06-28 (×4): 25 mg via ORAL
  Filled 2012-06-28 (×3): qty 1

## 2012-06-28 MED ORDER — NICOTINE 21 MG/24HR TD PT24: 21.0000 mg | MEDICATED_PATCH | Freq: Every day | TRANSDERMAL | Status: DC

## 2012-06-28 MED ORDER — TRAZODONE HCL 50 MG PO TABS
50.0000 mg | ORAL_TABLET | Freq: Every evening | ORAL | Status: DC | PRN
  Administered 2012-06-28 – 2012-06-30 (×4): 50 mg via ORAL
  Filled 2012-06-28: qty 28
  Filled 2012-06-28 (×3): qty 1
  Filled 2012-06-28: qty 28
  Filled 2012-06-28 (×2): qty 1
  Filled 2012-06-28 (×3): qty 28
  Filled 2012-06-28: qty 1
  Filled 2012-06-28: qty 28

## 2012-06-28 MED ORDER — THIAMINE HCL 100 MG/ML IJ SOLN
100.0000 mg | Freq: Once | INTRAMUSCULAR | Status: AC
  Administered 2012-06-28: 100 mg via INTRAMUSCULAR

## 2012-06-28 MED ORDER — CHLORDIAZEPOXIDE HCL 25 MG PO CAPS
25.0000 mg | ORAL_CAPSULE | Freq: Four times a day (QID) | ORAL | Status: AC | PRN
  Filled 2012-06-28: qty 1

## 2012-06-28 MED ORDER — LOPERAMIDE HCL 2 MG PO CAPS
2.0000 mg | ORAL_CAPSULE | ORAL | Status: AC | PRN
  Administered 2012-06-28: 4 mg via ORAL
  Administered 2012-06-28 – 2012-06-30 (×2): 2 mg via ORAL

## 2012-06-28 MED ORDER — NICOTINE 21 MG/24HR TD PT24
21.0000 mg | MEDICATED_PATCH | Freq: Every day | TRANSDERMAL | Status: DC
  Administered 2012-06-28 – 2012-07-01 (×4): 21 mg via TRANSDERMAL
  Filled 2012-06-28 (×6): qty 1

## 2012-06-28 MED ORDER — TRAZODONE HCL 50 MG PO TABS: 50.0000 mg | ORAL_TABLET | Freq: Every evening | ORAL | Status: DC | PRN

## 2012-06-28 MED ORDER — HYDROXYZINE HCL 25 MG PO TABS
25.0000 mg | ORAL_TABLET | Freq: Four times a day (QID) | ORAL | Status: AC | PRN
  Administered 2012-06-28 – 2012-06-30 (×4): 25 mg via ORAL

## 2012-06-28 MED ORDER — IBUPROFEN 400 MG PO TABS: 600.0000 mg | ORAL_TABLET | Freq: Three times a day (TID) | ORAL | Status: DC | PRN

## 2012-06-28 MED ORDER — ALUM & MAG HYDROXIDE-SIMETH 200-200-20 MG/5ML PO SUSP: 30.0000 mL | ORAL | Status: DC | PRN

## 2012-06-28 MED ORDER — LORAZEPAM 1 MG PO TABS: 1.0000 mg | ORAL_TABLET | Freq: Three times a day (TID) | ORAL | Status: DC | PRN

## 2012-06-28 MED ORDER — ADULT MULTIVITAMIN W/MINERALS CH
1.0000 | ORAL_TABLET | Freq: Every day | ORAL | Status: DC
  Administered 2012-06-28 – 2012-07-01 (×4): 1 via ORAL
  Filled 2012-06-28 (×7): qty 1

## 2012-06-28 MED ORDER — ZOLPIDEM TARTRATE 5 MG PO TABS: 10.0000 mg | ORAL_TABLET | Freq: Every evening | ORAL | Status: DC | PRN

## 2012-06-28 MED ORDER — CHLORDIAZEPOXIDE HCL 25 MG PO CAPS
25.0000 mg | ORAL_CAPSULE | Freq: Three times a day (TID) | ORAL | Status: AC
  Administered 2012-06-29 (×3): 25 mg via ORAL
  Filled 2012-06-28 (×3): qty 1

## 2012-06-28 MED ORDER — CHLORDIAZEPOXIDE HCL 25 MG PO CAPS
25.0000 mg | ORAL_CAPSULE | Freq: Every day | ORAL | Status: AC
  Administered 2012-07-01: 25 mg via ORAL
  Filled 2012-06-28: qty 1

## 2012-06-28 MED ORDER — CHLORDIAZEPOXIDE HCL 25 MG PO CAPS
25.0000 mg | ORAL_CAPSULE | ORAL | Status: AC
  Administered 2012-06-30 (×2): 25 mg via ORAL
  Filled 2012-06-28 (×2): qty 1

## 2012-06-28 MED ORDER — ONDANSETRON HCL 4 MG PO TABS
4.0000 mg | ORAL_TABLET | Freq: Three times a day (TID) | ORAL | Status: DC | PRN
Start: 2012-06-28 — End: 2012-06-28

## 2012-06-28 MED ORDER — VITAMIN B-1 100 MG PO TABS
100.0000 mg | ORAL_TABLET | Freq: Every day | ORAL | Status: DC
  Administered 2012-06-29 – 2012-07-01 (×3): 100 mg via ORAL
  Filled 2012-06-28 (×5): qty 1

## 2012-06-28 MED ORDER — ACETAMINOPHEN 325 MG PO TABS: 650.0000 mg | ORAL_TABLET | Freq: Four times a day (QID) | ORAL | Status: DC | PRN

## 2012-06-28 MED ORDER — ACETAMINOPHEN 325 MG PO TABS: 650.0000 mg | ORAL_TABLET | ORAL | Status: DC | PRN

## 2012-06-28 MED ORDER — PROMETHAZINE HCL 25 MG PO TABS: 12.5000 mg | ORAL_TABLET | Freq: Four times a day (QID) | ORAL | Status: DC | PRN

## 2012-06-28 MED ORDER — MAGNESIUM HYDROXIDE 400 MG/5ML PO SUSP: 30.0000 mL | Freq: Every day | ORAL | Status: DC | PRN

## 2012-06-28 MED ORDER — ONDANSETRON 4 MG PO TBDP: 4.0000 mg | ORAL_TABLET | Freq: Four times a day (QID) | ORAL | Status: AC | PRN

## 2012-06-28 MED ORDER — CHLORDIAZEPOXIDE HCL 25 MG PO CAPS
25.0000 mg | ORAL_CAPSULE | Freq: Once | ORAL | Status: AC
  Administered 2012-06-28: 25 mg via ORAL
  Filled 2012-06-28: qty 1

## 2012-06-28 MED ORDER — SERTRALINE HCL 25 MG PO TABS
25.0000 mg | ORAL_TABLET | Freq: Every day | ORAL | Status: DC
  Administered 2012-06-28 – 2012-07-01 (×4): 25 mg via ORAL
  Filled 2012-06-28: qty 1
  Filled 2012-06-28: qty 14
  Filled 2012-06-28 (×4): qty 1
  Filled 2012-06-28 (×2): qty 14

## 2012-06-28 NOTE — ED Provider Notes (Signed)
  Physical Exam  BP 113/73  Pulse 107  Temp 98.3 F (36.8 C) (Oral)  Resp 20  SpO2 98%  Physical Exam  ED Course  Procedures  MDM Pt has been accepted at [facility] ASA level is trending downwards.  Tachycardia is improving, no tachypnea.      Vida Roller, MD 06/28/12 [phone]

## 2012-06-28 NOTE — Progress Notes (Signed)
Patient ID: Monique Nguyen, female   DOB: 08/23/1970, 42 y.o.   MRN: [id number]  Pt. Present with c/o recent suicide attempt. She tried to OD on beer, 10 Celexa 40 mg and 30 Klonopin 2 mg. Pt reports depression and increase use of wine and beer. Pt. Reports that it is approaching the anniversary of husband's death who died 3 years ago of brain cancer, they had been married 20 years.  Pt. Also reports she has an autistic son who is 81 yo that lives with her and BF. Pt. Reports she hasn't been eating well and has about a 9-10 lbs weight loss. Pt. Also reports poor sleep. Pt had gone to [facility] for therapy, but when they told her they were going to take her off the Klonopin she did not return "I didn't understand that".  Pt. Also has legal issues with a DUI and failure to appear in court. Pt. Currently denies SHI. Pt. Offered food/drink, oriented to the unit/room. Pt. Has medical hx of GERS, IBS, and Psoriasis.  Pt. Offered food/drink, orient to the unit. Staff will monitor q68min for safety. Pt. Is safe on the unit.

## 2012-06-28 NOTE — Progress Notes (Signed)
D: Patient denies SI/HI and auditory and visual hallucinations. The patient has a depressed mood and affect. The patient rates her depression a 10 out of 10 and her hopelessness an 8 out of 10 (1 low/10 high). The patient reports sleeping poorly and states that her appetite is poor and that her energy level is low. The patient is participating in groups and interacting appropriately within the milieu.  A: Patient given emotional support from RN. Patient encouraged to come to staff with concerns and/or questions. Patient's medication routine continued. Patient's orders and plan of care reviewed.  R: Patient remains cooperative. Will continue to monitor patient q15 minutes for safety.

## 2012-06-28 NOTE — H&P (Signed)
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 She presented to [facility] after she called and told them that she had overdosed on wine and aspirin.  She was stabilized, given medical clearance and transferred to [facility] for detox from alcohol and benzodiazepines.  The patient states that she drinks a bottle and a half of wine every day or so, and has for the past several weeks.  She also notes that she takes Ativan 1mg  3x a day written by her PCP Dr. Phillips Odor at [facility]. Monique Nguyen admits that she takes 4 or 5 Ativan a day.     She reports her depression gets worse closer to her late husband's birthday in 07/31/22.  He died in Oct 28, 2009 from a glioblastoma.  Monique Nguyen lives with her BF and her 82 year old autistic son. 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  Monique Nguyen states that she has a history of depression and panic attacks, but denies any previous psychiatric admissions.  She notes that she was sober from alcohol for 6 months last year but "supplemented with Hydrocodone."  She has never received substance abuse treatment. Elements:  Location:  In patient admission to the adult unit. Quality:  chronic. Severity:  moderate to severe. Timing:  2+ years. Duration:  worsening over the last 6 months.. Context:  legal, health, relationships, and grief. Associated Signs/Synptoms: Depression Symptoms:  depressed mood, psychomotor agitation, feelings of worthlessness/guilt, difficulty concentrating, hopelessness, recurrent thoughts of death, anxiety, panic attacks, (Hypo) Manic Symptoms:  Distractibility, Impulsivity, Anxiety Symptoms:  Panic Symptoms, Psychotic Symptoms:  none PTSD Symptoms: none  Psychiatric Specialty Exam: Physical Exam    Constitutional: She appears well-developed and well-nourished.  Psychiatric: Her speech is normal and behavior is normal. Thought content normal. Her mood appears anxious. Her affect is labile. Cognition and memory are impaired. She expresses impulsivity and inappropriate judgment. She exhibits abnormal recent memory.       [name] with short cropped bleached hair, with facial piercings and multiple tattoos.  Patient is seen, chart reviewed, ROS completed, and no further physical exam is needed. I have reviewed the PE completed in the ED and agree with those findings with above additions.  Review of Systems  Constitutional: Negative.  Negative for fever, chills, weight loss, malaise/fatigue and diaphoresis.  HENT: Negative for congestion and sore throat.   Eyes: Negative for blurred vision, double vision and photophobia.  Respiratory: Negative for cough, shortness of breath and wheezing.   Cardiovascular: Negative for chest pain, palpitations and PND.  Gastrointestinal: Negative for heartburn, nausea, vomiting, abdominal pain, diarrhea and constipation.  Musculoskeletal: Negative for myalgias, joint pain and falls.  Neurological: Negative for dizziness, tingling, tremors, sensory change, speech change, focal weakness, seizures, loss of consciousness, weakness and headaches.  Endo/Heme/Allergies: Negative for polydipsia. Does not bruise/bleed easily.  Psychiatric/Behavioral: Positive for depression, suicidal ideas, memory loss and substance abuse. Negative for hallucinations. The patient is nervous/anxious and has insomnia.     Blood pressure 110/79, pulse 111, temperature 97.7 F (36.5 C), temperature source Oral, resp. rate 20, height 5' 0.5" (1.537 m), weight 51.256 kg (113 lb).Body mass index is 21.71 kg/(m^2).  General Appearance: Disheveled  Eye Contact::  Fair  Speech:  Clear and Coherent  Volume:  Normal  Mood:  Anxious and Irritable  Affect:  Labile and Tearful  Thought  Process:  Goal Directed  Orientation:  Full (Time, Place, and Person)  Thought Content:  Negative  Suicidal Thoughts:  Yes.  without intent/plan  Homicidal Thoughts:  No  Memory:  Negative Immediate;   Poor  Judgement:  Impaired  Insight:  Lacking  Psychomotor Activity:  Normal  Concentration:  Poor  Recall:  Poor  Akathisia:  No  Handed:  Right  AIMS (if indicated):     Assets:  Communication Skills Housing Physical Health Social Support  Sleep:  Number of Hours: 0     Past Psychiatric History: Diagnosis:  Hospitalizations:  Outpatient Care:  Substance Abuse Care:  Self-Mutilation:  Suicidal Attempts:  Violent Behaviors:   Past Medical History:   Past Medical History  Diagnosis Date  . GERD (gastroesophageal reflux disease)   . Interstitial cystitis   . Bacterial vaginosis   . Anxiety   . Depression   . Panic attacks   . Psoriasis    None. Allergies:   Allergies  Allergen Reactions  . Oxycodone Other (See Comments)    headache  . Sulfa Antibiotics Nausea And Vomiting and Other (See Comments)    Blurred vision and headaches   PTA Medications: Prescriptions prior to admission  Medication Sig Dispense Refill  . aspirin-acetaminophen-caffeine (EXCEDRIN MIGRAINE) 250-250-65 MG per tablet Take 15-20 tablets by mouth once.      Marland Kitchen LORazepam (ATIVAN) 1 MG tablet Take 1 mg by mouth every 8 (eight) hours.      . promethazine (PHENERGAN) 12.5 MG tablet Take 12.5 mg by mouth every 6 (six) hours as needed. For nausea        Previous Psychotropic Medications:  Medication/Dose                 Substance Abuse History in the last 12 months:  yes  Consequences of Substance Abuse: Medical Consequences:  chronic abdominal pain, nausea Legal Consequences:  DUI, Speeding, Failure to appear Family Consequences:  Autistic son is with BF's mother Withdrawal Symptoms:   Diaphoresis Headaches Nausea Tremors photophobia,irritable, parasthesias, blurred vision,  memory loss  Social History:  reports that she has been smoking Cigarettes.  She has been smoking about 1.5 packs per day. She does not have any smokeless tobacco history on file. She reports that she drinks about 1.8 ounces of alcohol per week. She reports that she uses illicit drugs (Marijuana). Additional Social History:                      Current Place of Residence:   Place of Birth:   Family Members: Marital Status:  Widowed Children:  Sons:  Daughters: Relationships: Education:  Corporate treasurer Problems/Performance: Religious Beliefs/Practices: History of Abuse (Emotional/Phsycial/Sexual) Occupational Experiences; none Military History:  None. Legal History: DUI, Speeding, failure to appear Hobbies/Interests:  Family History:   Family History  Problem Relation Age of Onset  . Depression Mother     living  . Pancreatic cancer Father     living    Results for orders placed during the hospital encounter of 06/28/12 (from the past 72 hour(s))  TSH     Status: Normal   Collection Time   06/28/12  6:28 AM      Component Value Range Comment   TSH 2.865  0.350 - 4.500 uIU/mL   T4, FREE     Status: Normal   Collection Time   06/28/12  6:28 AM  Component Value Range Comment   Free T4 0.90  0.80 - 1.80 ng/dL   ACETAMINOPHEN LEVEL     Status: Normal   Collection Time   06/28/12  6:28 AM      Component Value Range Comment   Acetaminophen (Tylenol), Serum <15.0  10 - 30 ug/mL   SALICYLATE LEVEL     Status: Normal   Collection Time   06/28/12  6:28 AM      Component Value Range Comment   Salicylate Lvl 17.1  2.8 - 20.0 mg/dL    Psychological Evaluations:  Assessment:   AXIS I:  Alcohol dependence, Benzodiazepine dependence, suicide attempt, complicated grief AXIS II:  Deferred AXIS III:   Past Medical History  Diagnosis Date  . GERD (gastroesophageal reflux disease)   . Interstitial cystitis   . Bacterial vaginosis   . Anxiety   . Depression     . Panic attacks   . Psoriasis    AXIS IV:  problems related to legal system/crime, problems related to social environment and problems with primary support group AXIS V:  41-50 serious symptoms  Treatment Plan/Recommendations:  1. Admit for crisis management,stabilization and detox with standard (Librium protocol.) 2. Medication management to reduce current symptoms to base line and improve the patient's overall level of functioning 3. Treat health problems as indicated. 4. Develop treatment plan to decrease risk of relapse upon discharge and the need for readmission. 5. Psycho-social education regarding relapse prevention and self care. 6. Health care follow up as needed for medical problems. 7. Restart home medications where appropriate.    Treatment Plan Summary: Daily contact with patient to assess and evaluate symptoms and progress in treatment Medication management Current Medications:  Current Facility-Administered Medications  Medication Dose Route Frequency Provider Last Rate Last Dose  . alum & mag hydroxide-simeth (MAALOX/MYLANTA) 200-200-20 MG/5ML suspension 30 mL  30 mL Oral Q4H PRN Kerry Hough, PA      . chlordiazePOXIDE (LIBRIUM) capsule 25 mg  25 mg Oral Q6H PRN Kerry Hough, PA      . chlordiazePOXIDE (LIBRIUM) capsule 25 mg  25 mg Oral QID Kerry Hough, PA   25 mg at 06/28/12 1204   Followed by  . chlordiazePOXIDE (LIBRIUM) capsule 25 mg  25 mg Oral TID Kerry Hough, PA       Followed by  . chlordiazePOXIDE (LIBRIUM) capsule 25 mg  25 mg Oral BH-qamhs Kerry Hough, PA       Followed by  . chlordiazePOXIDE (LIBRIUM) capsule 25 mg  25 mg Oral Daily Kerry Hough, PA      . hydrOXYzine (ATARAX/VISTARIL) tablet 25 mg  25 mg Oral Q6H PRN Kerry Hough, PA   25 mg at 06/28/12 0943  . loperamide (IMODIUM) capsule 2-4 mg  2-4 mg Oral PRN Kerry Hough, PA   2 mg at 06/28/12 1053  . magnesium hydroxide (MILK OF MAGNESIA) suspension 30 mL  30 mL Oral  Daily PRN Kerry Hough, PA      . multivitamin with minerals tablet 1 tablet  1 tablet Oral Daily Kerry Hough, PA   1 tablet at 06/28/12 0746  . nicotine (NICODERM CQ - dosed in mg/24 hours) patch 21 mg  21 mg Transdermal Daily Kerry Hough, PA   21 mg at 06/28/12 0618  . ondansetron (ZOFRAN-ODT) disintegrating tablet 4 mg  4 mg Oral Q6H PRN Kerry Hough, PA      . sertraline (ZOLOFT)  tablet 25 mg  25 mg Oral Daily Himabindu Ravi, MD   25 mg at 06/28/12 1507  . thiamine (VITAMIN B-1) tablet 100 mg  100 mg Oral Daily Kerry Hough, PA      . traZODone (DESYREL) tablet 50 mg  50 mg Oral QHS,MR X 1 Kerry Hough, PA        Observation Level/Precautions:  Detox  Laboratory:  reviewed  Psychotherapy:   Recommended upon discharge and grief therapy as well  Medications:   Librium protocol  Consultations:  Spiritual consult   Discharge Concerns:  Increased risk for relapse  Estimated LOS: 3-5 days   Other:     I certify that inpatient services furnished can reasonably be expected to improve the patient's condition.    Rona Ravens. Ariona Deschene PAC 1/16/20144:49 PM

## 2012-06-28 NOTE — Progress Notes (Signed)
Patient ID: Monique Nguyen, female   DOB: [birth year], 42 y.o.   MRN: [id number] D: Pt presented with depressed mood and flat affect. Pt denies SI/HI/AVH and pain. Pt attended evening karaoke. Pt is been visible on unit interacting with peers. Cooperative with assessment. No acute distressed noted at this time.   A: Met with pt 1:1. Medications administered as prescribed. Writer encouraged pt to discuss feelings. Pt encouraged to come to staff with any question or concerns.  R: Patient remains safe. She is complaint with medications and group programming. Continue current POC.
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
## 2012-06-28 NOTE — Tx Team (Addendum)
Initial Interdisciplinary Treatment Plan  PATIENT STRENGTHS: (choose at least two) Ability for insight Average or above average intelligence Capable of independent living Communication skills General fund of knowledge Religious Affiliation Supportive family/friends  PATIENT STRESSORS: Health problems Medication change or noncompliance Substance abuse   PROBLEM LIST: Problem List/Patient Goals Date to be addressed Date deferred Reason deferred Estimated date of resolution  Suicide Attempt 06-28-12     Depression 06-28-12     Psoriasis 06-28-12     IBS 06-28-12     GERDS 06-28-12                              DISCHARGE CRITERIA:  Improved stabilization in mood, thinking, and/or behavior Motivation to continue treatment in a less acute level of care Need for constant or close observation no longer present Verbal commitment to aftercare and medication compliance  PRELIMINARY DISCHARGE PLAN: Attend PHP/IOP Attend 12-step recovery group Outpatient therapy Return to previous living arrangement  PATIENT/FAMIILY INVOLVEMENT: This treatment plan has been presented to and reviewed with the patient, Monique Nguyen, and/or family member.  The patient and family have been given the opportunity to ask questions and make suggestions.  Mickeal Needy 06/28/2012, 4:02 AM

## 2012-06-28 NOTE — [facility] Suicide Risk Assessment (Signed)
Suicide Risk Assessment  Admission Assessment     Nursing information obtained from:  Patient Demographic factors:  Divorced or widowed;Caucasian;Unemployed Current Mental Status:  Self-harm thoughts Loss Factors:  Loss of significant relationship;Legal issues Historical Factors:  Prior suicide attempts;Anniversary of important loss Risk Reduction Factors:  Sense of responsibility to family;Religious beliefs about death;Positive social support  CLINICAL FACTORS:   Severe Anxiety and/or Agitation Depression:   Anhedonia Hopelessness Impulsivity Insomnia Severe  COGNITIVE FEATURES THAT CONTRIBUTE TO RISK:  Cognitively intact  SUICIDE RISK:   Moderate:  Frequent suicidal ideation with limited intensity, and duration, some specificity in terms of plans, no associated intent, good self-control, limited dysphoria/symptomatology, some risk factors present, and identifiable protective factors, including available and accessible social support.  PLAN OF CARE: Initiate Librium protocol. Start antidepressant medication. Encourage patient to attend groups.  I certify that inpatient services furnished can reasonably be expected to improve the patient's condition.  Randale Carvalho 06/28/2012, 2:32 PM

## 2012-06-28 NOTE — Clinical Social Work Note (Addendum)
[facility] LCSW Group Therapy  Living a Balanced Life 1:15 - 3: 30          06/28/2012  4:29 PM    Type of Therapy:  Group Therapy  Participation Level:  Appropriate  Participation Quality:  Appropriate  Affect: Depressed  Cognitive:  Attentive Appropriate  Insight:  Engaged  Engagement in Therapy:  Engaged  Modes of Intervention:  Discussion Exploration Problem-Solving Supportive  Summary of Progress/Problems: Patient listened attentively to speaker from [employer].  She questioned speaker on how she managed to stay focused after discharging from the hospital.  Patient stated she is not sure she has strength to stay in treatment after discharge.  Patient also stated she does not know what she will say to her special needs son who was present when she OD'd and ambulance came to take her to hospital.  Wynn Banker 06/28/2012  4:29 PM

## 2012-06-28 NOTE — Progress Notes (Signed)
During 11 am psycho educational group pts were encouraged to talk about positive activities that they can do and participate in to reduce stress and and use as coping skills for difficult times.  

## 2012-06-28 NOTE — ED Notes (Signed)
Pt transferred to [facility] via [facility].

## 2012-06-28 NOTE — Clinical Social Work Note (Signed)
[facility] LCSW Aftercare Discharge Planning Group Note       8:30-9:30 AM  1/16/201410:24 AM  Participation Quality:  Appropriate  Affect:  Appropriate  Cognitive:  Appropriate  Insight:  Engaged  Engagement in Group:  Engaged  Modes of Intervention:  Education, Exploration, Problem-solving and Support  Summary of Progress/Problems:  Patient advised of admitting to hospital following a suicide attempt.  She currently denies SI/HI.  She rates depression at ten and anxiety at eight.   She shared her husband died three years ago and the anniversary of his death is coming up soon.  She shared she drinks one to two bottles of wine daily for the past three four months but drank 24 beers daily for two and a half years prior to stopping the beer and starting wine,.  She will need referral for follow up.  Wynn Banker 06/28/2012 10:24 AM

## 2012-06-28 NOTE — Treatment Plan (Signed)
Interdisciplinary Treatment Plan Update (Adult)  Date: 06/28/2012  Time Reviewed: 10:56 AM   Progress in Treatment: Attending groups: Yes Participating in groups: Yes Taking medication as prescribed: Yes Tolerating medication: Yes   Family/Significant other contact made: Not yet. Patient gave consent to contact her boyfriend for suicide prevention    Patient understands diagnosis:  Yes  As evidenced by seeking help for alcohol dependence and depression Discussing patient identified problems/goals with staff:  Yes  See below Medical problems stabilized or resolved:  Yes Denies suicidal/homicidal ideation: Yes  In tx team Issues/concerns per patient self-inventory:  Yes  C/O poor sleep, appetite and low energy  Depression is a 10 and hopelessness an 8 Other:  New problem(s) identified: N/A  Reason for Continuation of Hospitalization: Depression Medication stabilization Withdrawal symptoms Suicidal Ideation  Interventions implemented related to continuation of hospitalization:  Librium taper, medication stabilization, encourage group attendance and participation, safety checks q 15 minutes, therapeutic milieu.   Estimated length of stay: 3-5 days  Discharge Plan: Patient plans to return to home that she and her boyfriend share. Patient is interested in counseling services in addition to med management. Currently exploring options with patient as she does not have insurance.   Review of initial/current patient goals per problem list:   1.  Goal(s): Eliminate SI  Met:  Yes  Target date:1/16  As evidenced by: self-report during PSA  2.  Goal (s): Safely detox from alcohol using librium taper  Met:  No  Target date:by d/c  As evidenced by: Stable vitals, CIWA score of 0, and no withdrawal symptoms as reported by patient.  3.  Goal(s): Reduce depression through medication stabilization  Met:  No  Target date: by d/c  As evidenced by: Pt will rate depression as a 3 or  below on 10 scale on self-inventory.  4.  Goal(s): Develop comprehensive sobriety/mental wellness plan   Met:  No  Target date: by d/c  As evidenced by: Patient is initially interested in receiving therapy services in addition to medication management and is open to exploring options to maintain sobriety--AA.  Will continue to work on this with her.  Attendees: Patient:     Family:     Physician:  Dr Daleen Bo 06/28/2012 10:56 AM   Nursing:  Nestor Ramp  06/28/2012 10:56 AM   Clinical Social Worker:  Richelle Ito 06/28/2012 10:56 AM   Extender:   06/28/2012 10:56 AM   Other:  Herbert Seta Smart 06/28/2012 10:56AM  Other:     Other:     Other:      Scribe for Treatment Team:   Nicki Guadalajara, MSW Intern 06/28/2012 10:56 AM

## 2012-06-28 NOTE — ED Notes (Signed)
Report given to [facility]. 

## 2012-06-29 NOTE — Progress Notes (Signed)
Patient ID: Monique Nguyen, female   DOB: September 19, 1970, 42 y.o.   MRN: [id number] D: patient is visible on unit interacting appropriately with peers. Pt presented with depressed mood and flat affect. Pt has no symptoms of withdrawal. Pt denies SI/HI/AVH and pain. Pt attended evening wrap up group. Cooperative with assessment. No acute distressed noted at this time.   A: Medications administered as prescribed. Writer encouraged pt to discuss feelings. Pt encouraged to come to staff with any question or concerns.  R: Patient remains safe. She is complaint with medications and group programming. Continue current POC.

## 2012-06-29 NOTE — Progress Notes (Signed)
Adult Psychoeducational Group Note  Date:  06/29/2012 Time:  12:00 PM  Group Topic/Focus:  Dimensions of Wellness:   The focus of this group is to introduce the topic of wellness and discuss the role each dimension of wellness plays in total health.  Participation Level:  Minimal  Participation Quality:  Inattentive  Affect:  Appropriate  Cognitive:  Appropriate  Insight: Appropriate  Engagement in Group:  Lacking  Modes of Intervention:  Discussion and Education  Additional Comments:     Henleigh Robello T 06/29/2012, 12:00 PM

## 2012-06-29 NOTE — Clinical Social Work Note (Signed)
Left msg for Monique Nguyen [phone] re: suicide prevention information.  Was unable to call out on hospital phone, but able to get thru on my cell phone.  Pt confirmed she has not been able to reach him on our phone system; has to rely on him calling in.  I asked him to call me today, Mareida tomorrow at [phone].

## 2012-06-29 NOTE — Progress Notes (Signed)
[facility] MD Progress Note  06/29/2012 12:59 PM Monique Nguyen  MRN:  [id number] Subjective: Patient reports she is feeling better. Tolerating zoloft well without side effects. States she spoke to her boyfriend and it went well. Continues to ruminate about husband`s death 3 years ago.  Diagnosis:   Axis I: Depressive Disorder NOS Axis II: Deferred Axis III:  Past Medical History  Diagnosis Date  . GERD (gastroesophageal reflux disease)   . Interstitial cystitis   . Bacterial vaginosis   . Anxiety   . Depression   . Panic attacks   . Psoriasis    Axis IV: other psychosocial or environmental problems Axis V: 51-60 moderate symptoms  ADL's:  Intact  Sleep: Fair  Appetite:  Fair  Psychiatric Specialty Exam: Review of Systems  Constitutional: Negative.   HENT: Negative.   Eyes: Negative.   Respiratory: Negative.   Cardiovascular: Negative.   Gastrointestinal: Negative.   Genitourinary: Negative.   Musculoskeletal: Negative.   Skin: Positive for itching.       On abdominal area secondary to psoriasis  Neurological: Negative.   Endo/Heme/Allergies: Negative.   Psychiatric/Behavioral: Positive for depression. The patient is nervous/anxious.     Blood pressure 95/72, pulse 118, temperature 97 F (36.1 C), temperature source Oral, resp. rate 16, height 5' 0.5" (1.537 m), weight 51.256 kg (113 lb).Body mass index is 21.71 kg/(m^2).  General Appearance: Disheveled  Eye Solicitor::  Fair  Speech:  Normal Rate  Volume:  Normal  Mood:  Dysphoric  Affect:  Constricted  Thought Process:  Coherent  Orientation:  Full (Time, Place, and Person)  Thought Content:  WDL  Suicidal Thoughts:  No  Homicidal Thoughts:  No  Memory:  Immediate;   Fair Recent;   Fair Remote;   Fair  Judgement:  Fair  Insight:  Fair  Psychomotor Activity:  Normal  Concentration:  Fair  Recall:  Fair  Akathisia:  No  Handed:  Right  AIMS (if indicated):     Assets:  Communication Skills Desire for  Improvement Housing Social Support  Sleep:  Number of Hours: 5.75    Current Medications: Current Facility-Administered Medications  Medication Dose Route Frequency Provider Last Rate Last Dose  . alum & mag hydroxide-simeth (MAALOX/MYLANTA) 200-200-20 MG/5ML suspension 30 mL  30 mL Oral Q4H PRN Kerry Hough, PA      . chlordiazePOXIDE (LIBRIUM) capsule 25 mg  25 mg Oral Q6H PRN Kerry Hough, PA      . chlordiazePOXIDE (LIBRIUM) capsule 25 mg  25 mg Oral TID Kerry Hough, PA   25 mg at 06/29/12 1106   Followed by  . chlordiazePOXIDE (LIBRIUM) capsule 25 mg  25 mg Oral BH-qamhs Kerry Hough, PA       Followed by  . chlordiazePOXIDE (LIBRIUM) capsule 25 mg  25 mg Oral Daily Kerry Hough, PA      . hydrOXYzine (ATARAX/VISTARIL) tablet 25 mg  25 mg Oral Q6H PRN Kerry Hough, PA   25 mg at 06/29/12 1105  . loperamide (IMODIUM) capsule 2-4 mg  2-4 mg Oral PRN Kerry Hough, PA   2 mg at 06/28/12 1053  . magnesium hydroxide (MILK OF MAGNESIA) suspension 30 mL  30 mL Oral Daily PRN Kerry Hough, PA      . multivitamin with minerals tablet 1 tablet  1 tablet Oral Daily Kerry Hough, PA   1 tablet at 06/29/12 0732  . nicotine (NICODERM CQ - dosed in mg/24 hours)  patch 21 mg  21 mg Transdermal Daily Kerry Hough, PA   21 mg at 06/29/12 0701  . ondansetron (ZOFRAN-ODT) disintegrating tablet 4 mg  4 mg Oral Q6H PRN Kerry Hough, PA      . sertraline (ZOLOFT) tablet 25 mg  25 mg Oral Daily Breana Litts, MD   25 mg at 06/29/12 0732  . thiamine (VITAMIN B-1) tablet 100 mg  100 mg Oral Daily Kerry Hough, PA   100 mg at 06/29/12 0732  . traZODone (DESYREL) tablet 50 mg  50 mg Oral QHS,MR X 1 Kerry Hough, PA   50 mg at 06/28/12 2340    Lab Results:  Results for orders placed during the hospital encounter of 06/28/12 (from the past 48 hour(s))  TSH     Status: Normal   Collection Time   06/28/12  6:28 AM      Component Value Range Comment   TSH 2.865  0.350 -  4.500 uIU/mL   T4, FREE     Status: Normal   Collection Time   06/28/12  6:28 AM      Component Value Range Comment   Free T4 0.90  0.80 - 1.80 ng/dL   ACETAMINOPHEN LEVEL     Status: Normal   Collection Time   06/28/12  6:28 AM      Component Value Range Comment   Acetaminophen (Tylenol), Serum <15.0  10 - 30 ug/mL   SALICYLATE LEVEL     Status: Normal   Collection Time   06/28/12  6:28 AM      Component Value Range Comment   Salicylate Lvl 17.1  2.8 - 20.0 mg/dL     Physical Findings: AIMS: Facial and Oral Movements Muscles of Facial Expression: None, normal Lips and Perioral Area: None, normal Jaw: None, normal Tongue: None, normal,Extremity Movements Upper (arms, wrists, hands, fingers): None, normal Lower (legs, knees, ankles, toes): None, normal, Trunk Movements Neck, shoulders, hips: None, normal, Overall Severity Severity of abnormal movements (highest score from questions above): None, normal Incapacitation due to abnormal movements: None, normal Patient's awareness of abnormal movements (rate only patient's report): No Awareness, Dental Status Current problems with teeth and/or dentures?: No Does patient usually wear dentures?: No  CIWA:  CIWA-Ar Total: 0  COWS:     Treatment Plan Summary: Daily contact with patient to assess and evaluate symptoms and progress in treatment Medication management  Plan: Continue current plan of care. Discharge Sunday since patient`s 72 hours will be up. Encourage patient to keep follow up appointments.  Medical Decision Making Problem Points:  Established problem, stable/improving (1), Review of last therapy session (1) and Review of psycho-social stressors (1) Data Points:  Review of medication regiment & side effects (2)  I certify that inpatient services furnished can reasonably be expected to improve the patient's condition.   [name] 06/29/2012, 12:59 PM

## 2012-06-29 NOTE — Progress Notes (Signed)
8 pm Wrap up group: Pt stated name and gave a brief description about how pt day has been going. Pt main focus was to return back to the movie that pt and peers was watching before group started. 

## 2012-06-29 NOTE — Clinical Social Work Note (Signed)
[facility] LCSW Aftercare Discharge Planning Group Note       8:30-9:30 AM  1/17/201410:34 AM  Participation Quality:  Appropriate  Affect: Depressed  Cognitive:  Appropriate  Insight:  Engaged  Engagement in Group:  Engaged  Modes of Intervention:  Education, Exploration, Problem-solving and Support  Summary of Progress/Problems: Patient advised of feeling better today.  She denies SI/HI.  Patient rates depression and anxiety at eight.    Wynn Banker 06/29/2012 10:34 AM

## 2012-06-29 NOTE — Progress Notes (Signed)
D: Patient denies SI/HI and auditory and visual hallucinations. The patient has a depressed mood and affect. The patient rates her depression a 6 out of 10 and her hopelessness a 4 out of 10 (1 low/10 high). The patient reports requesting sleep medication and states that her appetite is poor and that her energy level is low. The patient is participating in groups and interacting appropriately within the milieu.   A: Patient given emotional support from RN. Patient encouraged to come to staff with concerns and/or questions. Patient's medication routine continued. Patient's orders and plan of care reviewed.   R: Patient remains cooperative. Will continue to monitor patient q15 minutes for safety.

## 2012-06-29 NOTE — Progress Notes (Signed)
Adult Psychoeducational Group Note  Date:  06/29/2012 Time:  1000  Group Topic/Focus:  Relapse Prevention Planning:   The focus of this group is to define relapse and discuss the need for planning to combat relapse.  Participation Level:  Active  Participation Quality:  Appropriate and Attentive  Affect:  Flat  Cognitive:  Alert and Appropriate  Insight: Good  Engagement in Group:  Engaged  Modes of Intervention:  Activity and Discussion  Additional Comments:  Pt was an active participant in group.  She shared that she needed more fun in her life after discharge and shared that she would walk her dog, get outdoors more, and watch less TV.  Gwyndolyn Kaufman 06/29/2012, 11:26 AM

## 2012-06-29 NOTE — Clinical Social Work Note (Signed)
[facility] LCSW Group Therapy  Feelings Around Relapse 1:15 -2:20        06/29/2012  2:49 PM   Type of Therapy:  Group Therapy  Participation Level:  Appropriate  Participation Quality:  Appropriate  Affect:  Appropriate  Cognitive:  Attentive Appropriate  Insight:  Engaged  Engagement in Therapy:  Engaged  Modes of Intervention:  Discussion Exploration Problem-Solving Supportive  Summary of Progress/Problems:  Patient shared sleeping late and not getting dressed would mean she is relapsing into old patterns.  Patient shared she plans to visit the home she shared with her deceased husband and maybe spend some time.  She was encouraged to visit the house and become comfortable being there before staying and to have someone with her.  Wynn Banker 06/29/2012 2:49 PM

## 2012-06-29 NOTE — Progress Notes (Signed)
Adult Psychoeducational Group Note  Date:  06/29/2012 Time:  2000  Group Topic/Focus:  Wrap-Up Group:   The focus of this group is to help patients review their daily goal of treatment and discuss progress on daily workbooks.  Participation Level:  Active  Participation Quality:  Appropriate  Affect:  Appropriate  Cognitive:  Appropriate  Insight: Appropriate  Engagement in Group:  Engaged  Modes of Intervention:  Activity  Additional Comments:  Pt attended karaoke this evening. Pt participated in group.   Annete Ayuso A 06/29/2012, 12:38 AM

## 2012-06-30 MED ORDER — SERTRALINE HCL 25 MG PO TABS: 25.0000 mg | ORAL_TABLET | Freq: Every day | ORAL | Status: DC

## 2012-06-30 MED ORDER — TRAZODONE HCL 50 MG PO TABS: 50.0000 mg | ORAL_TABLET | Freq: Every evening | ORAL | Status: DC | PRN

## 2012-06-30 NOTE — Progress Notes (Signed)
D: Patient denies SI/HI/AVH. Patient rates hopelessness as 1 and depression as 3.  Patient affect is depressed. Mood is depressed.  Pt states that she slept fair and her appetite is improving.  Pt states that her energy level is normal and her ability to pay attention is good.  Pt states that when she gets home she plans to "Go to my appointments, help groups, get out of the house a lot more, focus on myself and my well being."  Patient did attend group. Patient visible on the milieu. No distress noted. A: Support and encouragement offered. Scheduled medications given to pt. Q 15 min checks continued for patient safety. R: Patient receptive. Patient remains safe on the unit.

## 2012-06-30 NOTE — Progress Notes (Signed)
Adult Psychoeducational Group Note  Date:  06/30/2012 Time:  1400  Group Topic/Focus:  Healthy Communication:   The focus of this group is to discuss communication, barriers to communication, as well as healthy ways to communicate with others.  Participation Level:  Did Not Attend  Aaralynn Shepheard Ivan 06/30/2012, 2:56 PM 

## 2012-06-30 NOTE — Progress Notes (Signed)
Met with pt 1:1. Pt complaining of anxiety and requesting prn vistaril. Does not feel anxiety is withdrawal related. Is looking forward to discharge tomorrow. Support and encouragement given. Medicated with vistril prn. On reassess pt is in dayroom playing cards with peers, laughing and joking. No SI/HI/AVH and pt remains safe. Monique Nguyen

## 2012-06-30 NOTE — Progress Notes (Signed)
[facility] MD Progress Note  06/30/2012 2:44 PM SHAKEIRA RHEE  MRN:  [id number] Subjective:  Mix up over discharge caused patient much distress. Ok to discharge tomorrow.  Diagnosis:   Axis I: Depressive Disorder NOS Axis II: Deferred Axis III:  Past Medical History  Diagnosis Date  . GERD (gastroesophageal reflux disease)   . Interstitial cystitis   . Bacterial vaginosis   . Anxiety   . Depression   . Panic attacks   . Psoriasis    Axis IV: other psychosocial or environmental problems Axis V: 51-60 moderate symptoms  ADL's:  Intact  Sleep: Fair  Appetite:  Fair  Psychiatric Specialty Exam: Review of Systems  Constitutional: Negative.   HENT: Negative.   Eyes: Negative.   Respiratory: Negative.   Cardiovascular: Negative.   Gastrointestinal: Negative.   Genitourinary: Negative.   Musculoskeletal: Negative.   Skin: Positive for itching.       On abdominal area secondary to psoriasis  Neurological: Negative.   Endo/Heme/Allergies: Negative.   Psychiatric/Behavioral: Positive for depression. The patient is nervous/anxious.     Blood pressure 98/67, pulse 112, temperature 97.9 F (36.6 C), temperature source Oral, resp. rate 16, height 5' 0.5" (1.537 m), weight 51.256 kg (113 lb).Body mass index is 21.71 kg/(m^2).  General Appearance: Disheveled  Eye Solicitor::  Fair  Speech:  Normal Rate  Volume:  Normal  Mood:  Dysphoric  Affect:  Constricted  Thought Process:  Coherent  Orientation:  Full (Time, Place, and Person)  Thought Content:  WDL  Suicidal Thoughts:  No  Homicidal Thoughts:  No  Memory:  Immediate;   Fair Recent;   Fair Remote;   Fair  Judgement:  Fair  Insight:  Fair  Psychomotor Activity:  Normal  Concentration:  Fair  Recall:  Fair  Akathisia:  No  Handed:  Right  AIMS (if indicated):     Assets:  Communication Skills Desire for Improvement Housing Social Support  Sleep:  Number of Hours: 6.5    Current Medications: Current  Facility-Administered Medications  Medication Dose Route Frequency Provider Last Rate Last Dose  . alum & mag hydroxide-simeth (MAALOX/MYLANTA) 200-200-20 MG/5ML suspension 30 mL  30 mL Oral Q4H PRN Kerry Hough, PA      . chlordiazePOXIDE (LIBRIUM) capsule 25 mg  25 mg Oral Q6H PRN Kerry Hough, PA      . chlordiazePOXIDE (LIBRIUM) capsule 25 mg  25 mg Oral BH-qamhs Kerry Hough, PA   25 mg at 06/30/12 0827   Followed by  . chlordiazePOXIDE (LIBRIUM) capsule 25 mg  25 mg Oral Daily Kerry Hough, PA      . hydrOXYzine (ATARAX/VISTARIL) tablet 25 mg  25 mg Oral Q6H PRN Kerry Hough, PA   25 mg at 06/29/12 1105  . loperamide (IMODIUM) capsule 2-4 mg  2-4 mg Oral PRN Kerry Hough, PA   2 mg at 06/30/12 0827  . magnesium hydroxide (MILK OF MAGNESIA) suspension 30 mL  30 mL Oral Daily PRN Kerry Hough, PA      . multivitamin with minerals tablet 1 tablet  1 tablet Oral Daily Kerry Hough, PA   1 tablet at 06/30/12 0827  . nicotine (NICODERM CQ - dosed in mg/24 hours) patch 21 mg  21 mg Transdermal Daily Kerry Hough, PA   21 mg at 06/30/12 4098  . ondansetron (ZOFRAN-ODT) disintegrating tablet 4 mg  4 mg Oral Q6H PRN Kerry Hough, PA      .  sertraline (ZOLOFT) tablet 25 mg  25 mg Oral Daily Himabindu Ravi, MD   25 mg at 06/30/12 0827  . thiamine (VITAMIN B-1) tablet 100 mg  100 mg Oral Daily Kerry Hough, PA   100 mg at 06/30/12 0827  . traZODone (DESYREL) tablet 50 mg  50 mg Oral QHS,MR X 1 Kerry Hough, PA   50 mg at 06/30/12 0021    Lab Results:  No results found for this or any previous visit (from the past 48 hour(s)).  Physical Findings: AIMS: Facial and Oral Movements Muscles of Facial Expression: None, normal Lips and Perioral Area: None, normal Jaw: None, normal Tongue: None, normal,Extremity Movements Upper (arms, wrists, hands, fingers): None, normal Lower (legs, knees, ankles, toes): None, normal, Trunk Movements Neck, shoulders, hips: None,  normal, Overall Severity Severity of abnormal movements (highest score from questions above): None, normal Incapacitation due to abnormal movements: None, normal Patient's awareness of abnormal movements (rate only patient's report): No Awareness, Dental Status Current problems with teeth and/or dentures?: No Does patient usually wear dentures?: No  CIWA:  CIWA-Ar Total: 0  COWS:     Treatment Plan Summary: Daily contact with patient to assess and evaluate symptoms and progress in treatment Medication management  Plan: Continue current plan of care. Discharge Sunday since patient`s 72 hours will be up. Encourage patient to keep follow up appointments.  Medical Decision Making Problem Points:  Established problem, stable/improving (1), Review of last therapy session (1) and Review of psycho-social stressors (1) Data Points:  Review of medication regiment & side effects (2)  I certify that inpatient services furnished can reasonably be expected to improve the patient's condition.   Dawana Asper,MICKIE D. RPA-C  CAQ-Psych 06/30/2012, 2:44 PM

## 2012-06-30 NOTE — Clinical Social Work Psychosocial (Signed)
[facility] Group Notes:  (Clinical Social Work)  06/30/2012   3:00-4:00PM  Summary of Progress/Problems:   The main focus of today's process group was for the patient to identify ways in which they have in the past sabotaged their own recovery and reasons they may have done this/what they received from doing it.  We then worked to identify a current goal, how they might self-sabotage, the benefits and problems with that self-sabotaging behavior, and how motivated they are to change.  The patient expressed she needs to think about herself first, even before her 24yo autistic son.  She would self-sabotage if she started drinking again, and acknowledges that she drinks sometimes to kill the depression.  She feels if she drank, she could not stop.  She is 100% motivated to change.  Type of Therapy:  Group Therapy - Process  Participation Level:  Active  Participation Quality:  Intrusive and Redirectable  Affect:  Appropriate  Cognitive:  Appropriate  Insight:  Developing/Improving  Engagement in Therapy:  Developing/Improving  Modes of Intervention:  Clarification, Education, Limit-setting, Problem-solving, Socialization, Support and Processing, Exploration, Discussion   Ambrose Mantle, LCSW 06/30/2012, 6:09 PM

## 2012-06-30 NOTE — Progress Notes (Signed)
Patient belongings returned to locker #31 following recended d/c.  15' checks reinitiated.  Patient behavior is cooperative and appropriate. Informed staff RN and charge nurse.

## 2012-06-30 NOTE — Progress Notes (Signed)
1315 [facility] Psych educational Group co hosted by Latoya L. [facility]HT consisted of positive coping skills that pts can use during difficult times. Pts were also encouraged to try changing there lifestyle in a positive direction to prevent themselves from falling back into the back bad habits. Pt remain active and alert in group and remain appropriate during group. Pt feels that a lot was learned from this group. 

## 2012-06-30 NOTE — Progress Notes (Signed)
Date: 06/30/2012  Time: 0900  Group Topic/Focus: Goal setting and review of workbook:  Healthy Coping Skills Participation Level: good Participation Quality: Appropriate  Affect: Flat  Cognitive: Appropriate  Insight: provided good insight Engagement in Group: answered writers questions  Modes of Intervention: workbook and discussion  Additional Comments: Pt attended group and contributed well

## 2012-06-30 NOTE — [facility] Suicide Risk Assessment (Signed)
Suicide Risk Assessment  Discharge Assessment     Demographic Factors:  Caucasian  Mental Status Per Nursing Assessment::   On Admission:  Self-harm thoughts  Current Mental Status by Physician: Mood ok. Affect broad, no si, no hi, logical, fair judgement, memory intact. No avh  Loss Factors: Financial problems/change in socioeconomic status and NA, legal issues  Historical Factors: Prior SI attempts  Risk Reduction Factors:   Living with another person, especially a relative  Continued Clinical Symptoms:  Alcohol/Substance Abuse/Dependencies  Cognitive Features That Contribute To Risk:  Closed-mindedness    Suicide Risk:  Mild:  Suicidal ideation of limited frequency, intensity, duration, and specificity.  There are no identifiable plans, no associated intent, mild dysphoria and related symptoms, good self-control (both objective and subjective assessment), few other risk factors, and identifiable protective factors, including available and accessible social support.  Discharge Diagnoses:   AXIS I:  Alcohol dep, dep d/o nos AXIS II:  Deferred AXIS III:   Past Medical History  Diagnosis Date  . GERD (gastroesophageal reflux disease)   . Interstitial cystitis   . Bacterial vaginosis   . Anxiety   . Depression   . Panic attacks   . Psoriasis    AXIS IV:  other psychosocial or environmental problems AXIS V:  51-60 moderate symptoms  Plan Of Care/Follow-up recommendations:  Activity:  out pt psy f/u  Is patient on multiple antipsychotic therapies at discharge:  No   Has Patient had three or more failed trials of antipsychotic monotherapy by history:  No  Recommended Plan for Multiple Antipsychotic Therapies: Additional reason(s) for multiple antispychotic treatment:  n/a  Wonda Cerise 06/30/2012, 8:45 PM

## 2012-07-01 NOTE — Progress Notes (Signed)
Pt was discharged home today. She denied any S/I H/I or A/V hallucinations.  She was given f/u appointment, rx, sample medications, and hotline info booklet.  She voiced understanding to all instructions provided.  She declined the need for smoking cessation materials.  She removed her nicotine patch before she left. 

## 2012-07-01 NOTE — Progress Notes (Signed)
Adult Psychoeducational Group Note  Date:  07/01/2012 Time:  0915  Group Topic/Focus:  Self Inventory Review  Participation Level:  Active  Participation Quality:  Appropriate and Attentive  Affect:  Anxious and Appropriate  Cognitive:  Alert and Appropriate  Insight: Appropriate and Good  Engagement in Group:  Engaged  Modes of Intervention:  Discussion and Exploration  Additional Comments:    Barbette Merino, Maximum Reiland Shari Prows 07/01/2012, 10:16 AM

## 2012-07-01 NOTE — Progress Notes (Signed)
[facility] Adult Case Management Discharge Plan :  Will you be returning to the same living situation after discharge: Yes,   At discharge, do you have transportation home?:Yes,   Do you have the ability to pay for your medications:Yes,    Release of information consent forms completed and in the chart;  Patient's signature needed at discharge.  Patient to Follow up at: Follow-up Information    Follow up with [facility]. (Call number provided to schedule an appointment after discharge with Jasmine December, the therapist.  She charges an affordable sliding fee scale. )    Contact information:   [address] [place], [place] [address] phone: [phone]   fax: [phone]      Follow up with [facility]. On 07/03/2012. (Arrive between 8 and 10AM on Tuesday, 07/03/12 for appointment.)    Contact information:   [address]   [place], [place] [address] phone  [phone] fax [phone]         Patient denies SI/HI:   Yes,      Safety Planning and Suicide Prevention discussed:  Yes,  by weekday staff  Sarina Ser 07/01/2012, 4:22 PM

## 2012-07-03 NOTE — Progress Notes (Signed)
Patient Discharge Instructions:  After Visit Summary (AVS):   Faxed to:  07/03/12 Psychiatric Admission Assessment Note:   Faxed to:  07/03/12 Suicide Risk Assessment - Discharge Assessment:   Faxed to:  07/03/12 Faxed/Sent to the Next Level Care provider:  07/03/12 Faxed to [facility] @ [phone] Faxed to [facility] @ [phone]  Jerelene Redden, 07/03/2012, 4:01 PM

## 2012-07-07 ENCOUNTER — Emergency Department (HOSPITAL_COMMUNITY)
Admission: EM | Admit: 2012-07-07 | Discharge: 2012-07-07 | Disposition: A | Discharge status: Home / Self Care | Payer: Self-pay | Attending: Emergency Medicine | Admitting: Emergency Medicine

## 2012-07-07 ENCOUNTER — Encounter (HOSPITAL_COMMUNITY): Payer: Self-pay | Admitting: *Deleted

## 2012-07-07 DIAGNOSIS — N764 Abscess of vulva: Secondary | ICD-10-CM | POA: Insufficient documentation

## 2012-07-07 DIAGNOSIS — R51 Headache: Secondary | ICD-10-CM | POA: Insufficient documentation

## 2012-07-07 DIAGNOSIS — Z79899 Other long term (current) drug therapy: Secondary | ICD-10-CM | POA: Insufficient documentation

## 2012-07-07 DIAGNOSIS — M545 Low back pain, unspecified: Secondary | ICD-10-CM | POA: Insufficient documentation

## 2012-07-07 DIAGNOSIS — F17200 Nicotine dependence, unspecified, uncomplicated: Secondary | ICD-10-CM | POA: Insufficient documentation

## 2012-07-07 DIAGNOSIS — R42 Dizziness and giddiness: Secondary | ICD-10-CM | POA: Insufficient documentation

## 2012-07-07 DIAGNOSIS — Z8719 Personal history of other diseases of the digestive system: Secondary | ICD-10-CM | POA: Insufficient documentation

## 2012-07-07 DIAGNOSIS — F411 Generalized anxiety disorder: Secondary | ICD-10-CM | POA: Insufficient documentation

## 2012-07-07 DIAGNOSIS — Z872 Personal history of diseases of the skin and subcutaneous tissue: Secondary | ICD-10-CM | POA: Insufficient documentation

## 2012-07-07 DIAGNOSIS — F329 Major depressive disorder, single episode, unspecified: Secondary | ICD-10-CM | POA: Insufficient documentation

## 2012-07-07 DIAGNOSIS — F3289 Other specified depressive episodes: Secondary | ICD-10-CM | POA: Insufficient documentation

## 2012-07-07 MED ORDER — HYDROCODONE-ACETAMINOPHEN 5-325 MG PO TABS
1.0000 | ORAL_TABLET | Freq: Once | ORAL | Status: AC
  Administered 2012-07-07: 1 via ORAL
  Filled 2012-07-07: qty 1

## 2012-07-07 MED ORDER — HYDROCODONE-ACETAMINOPHEN 5-325 MG PO TABS: 1.0000 | ORAL_TABLET | Freq: Three times a day (TID) | ORAL | Status: DC | PRN

## 2012-07-07 MED ORDER — LIDOCAINE HCL (PF) 1 % IJ SOLN
5.0000 mL | Freq: Once | INTRAMUSCULAR | Status: AC
  Administered 2012-07-07: 5 mL via INTRADERMAL
  Filled 2012-07-07: qty 5

## 2012-07-07 MED ORDER — CLINDAMYCIN HCL 150 MG PO CAPS: 300.0000 mg | ORAL_CAPSULE | Freq: Three times a day (TID) | ORAL | Status: DC

## 2012-07-07 MED ORDER — CLINDAMYCIN HCL 150 MG PO CAPS
300.0000 mg | ORAL_CAPSULE | Freq: Once | ORAL | Status: AC
  Administered 2012-07-07: 300 mg via ORAL
  Filled 2012-07-07: qty 2

## 2012-07-07 NOTE — ED Notes (Signed)
Pt presents to vaginal abscess which she first noticed "a few days ago". Pt reports drainage and pain to area.

## 2012-07-07 NOTE — ED Notes (Signed)
Pt c/o vaginal abscess to left labia area, started a few days ago, does have some drainage, pt also c/o lower back pain that started a few days ago, headache that has been off and on since last week, dizziness that is associated with the headaches, pt states that she is detoxing from alcohol and pills, has had tx for the detox and thinks that the headache and dizziness is related to that.

## 2012-07-07 NOTE — ED Provider Notes (Signed)
History  This chart was scribed for Gerhard Munch, MD by Ardeen Jourdain, ED Scribe. This patient was seen in room APA11/APA11 and the patient's care was started at 1613.  CSN: [id number]  Arrival date & time 07/07/12  1542   First MD Initiated Contact with Patient 07/07/12 1613      Chief Complaint  Patient presents with  . Abscess     The history is provided by the patient. No language interpreter was used.    Monique Nguyen is a 42 y.o. female who presents to the Emergency Department complaining of a vaginal abscess to her left labia area with associated drainage, pain with urination, lower abdominal pain and pain. She states she noticed the abscess a few days ago. She states she has had similar abscesses in the past, but nothing as large as the current one. She denies any nausea, emesis, upper abdominal pain and fever as associated symptoms.  She is also c/o unrelated lower back pain, HA and dizziness. She states the back pain began a few days ago and the HA has been intermittent for a week. She states she believes the dizziness is associated with the HA. She reports she is currently trying to detox from alcohol and pills. She reports she has had treatment for the detox and believes the HA and dizziness are related to that.     Past Medical History  Diagnosis Date  . GERD (gastroesophageal reflux disease)   . Interstitial cystitis   . Bacterial vaginosis   . Anxiety   . Depression   . Panic attacks   . Psoriasis     Past Surgical History  Procedure Date  . Bladder surgery     X 3    Family History  Problem Relation Age of Onset  . Depression Mother     living  . Pancreatic cancer Father     living    History  Substance Use Topics  . Smoking status: Current Every Day Smoker -- 1.5 packs/day    Types: Cigarettes  . Smokeless tobacco: Not on file  . Alcohol Use: No   No Ob history available.   Review of Systems  Constitutional:       Per HPI, otherwise  negative  HENT:       Per HPI, otherwise negative  Eyes: Negative.   Respiratory:       Per HPI, otherwise negative  Cardiovascular:       Per HPI, otherwise negative  Gastrointestinal: Negative for vomiting.  Genitourinary: Negative.   Musculoskeletal:       Per HPI, otherwise negative  Skin: Negative.   Neurological: Negative for syncope.    Allergies  Doxycycline and Sulfa antibiotics  Home Medications   Current Outpatient Rx  Name  Route  Sig  Dispense  Refill  . SERTRALINE HCL 25 MG PO TABS   Oral   Take 1 tablet (25 mg total) by mouth daily. Depression/anxiety   30 tablet   0   . TRAZODONE HCL 50 MG PO TABS   Oral   Take 1 tablet (50 mg total) by mouth at bedtime and may repeat dose one time if needed. For sleep   60 tablet   0     Triage Vitals: BP 102/70  Pulse 120  Temp 98.2 F (36.8 C)  Resp 20  SpO2 100%  LMP 06/24/2012  Physical Exam  Nursing note and vitals reviewed. Constitutional: She is oriented to person, place, and time. She appears  well-developed and well-nourished. No distress.  HENT:  Head: Normocephalic and atraumatic.  Eyes: Conjunctivae normal and EOM are normal. Pupils are equal, round, and reactive to light.  Neck: Normal range of motion.  Cardiovascular: Normal rate, regular rhythm and normal heart sounds.  Exam reveals no gallop and no friction rub.   No murmur heard. Pulmonary/Chest: Effort normal and breath sounds normal. No stridor. No respiratory distress. She has no wheezes. She has no rales. She exhibits no tenderness.  Abdominal: Soft. Bowel sounds are normal. She exhibits no distension and no mass. There is no tenderness. There is no rebound and no guarding.  Genitourinary:          Pubic hair groomed  Musculoskeletal: She exhibits no edema.  Neurological: She is alert and oriented to person, place, and time. No cranial nerve deficit.  Skin: Skin is warm and dry.  Psychiatric: She has a normal mood and affect.     ED Course  INCISION AND DRAINAGE Date/Time: 07/07/2012 6:20 PM Performed by: Gerhard Munch Authorized by: Gerhard Munch Consent: Verbal consent obtained. The procedure was performed in an emergent situation. Risks and benefits: risks, benefits and alternatives were discussed Consent given by: patient Patient understanding: patient states understanding of the procedure being performed Patient consent: the patient's understanding of the procedure matches consent given Procedure consent: procedure consent matches procedure scheduled Relevant documents: relevant documents present and verified Test results: test results available and properly labeled Site marked: the operative site was marked Imaging studies: imaging studies available Required items: required blood products, implants, devices, and special equipment available Patient identity confirmed: verbally with patient Time out: Immediately prior to procedure a "time out" was called to verify the correct patient, procedure, equipment, support staff and site/side marked as required. Type: abscess Body area: anogenital Location details: vulva Anesthesia: local infiltration Local anesthetic: lidocaine 1% without epinephrine Anesthetic total: 3 ml Patient sedated: no Scalpel size: 11 Incision type: single straight Complexity: simple Drainage characteristics: moderate pus, then blood. Drainage amount: moderate Wound treatment: wound left open Patient tolerance: Patient tolerated the procedure well with no immediate complications.   (including critical care time)  DIAGNOSTIC STUDIES: Oxygen Saturation is 100% on room air, normal by my interpretation.    COORDINATION OF CARE:  4:42 PM: Discussed treatment plan which includes I&D of the abscess with pt at bedside and pt agreed to plan.     Labs Reviewed - No data to display No results found.   No diagnosis found.    MDM   I personally performed the services  described in this documentation, which was scribed in my presence. The recorded information has been reviewed and is accurate.   The patient presents with ongoing pain from a fall the abscess.  On exam there is a notable abscess.  Following incision and drainage with a 11 blade scalpel, this was diminished.  Absent fever, distress, there is a suspicion for acute systemic infection, though the spreading erythema indicated the need for antibiotics.  We discussed return precautions, the need for a wound check in 2 days, options for this check to occur.  We also discussed the need for sitz bathes, close monitoring.   Gerhard Munch, MD 07/07/12 1836

## 2012-07-09 NOTE — H&P (Signed)
Reviewed. Patient seen and assessed, agree with key elements of H &P.

## 2012-08-18 NOTE — Discharge Summary (Signed)
Physician Discharge Summary Note  Patient:  Monique Nguyen is an 42 y.o., female MRN:  [id number] DOB:  Dec 22, 1970 Patient phone:  [phone] (home)  Patient address:   [address] [place] [address],   Date of Admission:  06/28/2012 Date of Discharge:   Reason for Admission:  Drinking, SI thoughts  Discharge Diagnoses: Principal Problem:   Alcohol dependence Active Problems:   Benzodiazepine abuse, continuous   Suicide threat or attempt   Complicated grief  ROS Axis Diagnosis:   AXIS I: Alcohol dep, dep d/o nos  AXIS II: Deferred  AXIS III:  Past Medical History   Diagnosis  Date   .  GERD (gastroesophageal reflux disease)    .  Interstitial cystitis    .  Bacterial vaginosis    .  Anxiety    .  Depression    .  Panic attacks    .  Psoriasis     AXIS IV: other psychosocial or environmental problems  AXIS V: 51-60 moderate symptoms   Hospital Course:     This is a voluntary admission for Monique Nguyen who is a 42 yr old WWF from [place]. She presented to [facility] after she called and told them that she had overdosed on wine and aspirin. She was stabilized, given medical clearance and transferred to [facility] for detox from alcohol and benzodiazepines. The patient states that she drinks a bottle and a half of wine every day or so, and has for the past several weeks. She also notes that she takes Ativan 1mg  3x a day written by her PCP Dr. Phillips Odor at [facility]. Elan admits that she takes 4 or 5 Ativan a day.  She reports her depression gets worse closer to her late husband's birthday in 22-Aug-2022. He died in Nov 19, 2009 from a glioblastoma. Monique Nguyen lives with her BF and her 5 year old autistic son. 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 [name] states that she has a history of depression and panic attacks, but denies any previous psychiatric admissions. She notes that she was sober from alcohol for 6 months last  year but "supplemented with Hydrocodone." She has never received substance abuse treatment.  Was admitted, attended groups. Observed for withdrawals, detox was done. Mood is improved. She denied SI after her admission. Was started on zoloft for depressive symtpoms. She was able to tolerate it well. On the day of discharge she reported that she was ready for discharge.     Consults:  None  Significant Diagnostic Studies:  None  Discharge Vitals:   Blood pressure 102/70, pulse 79, temperature 97.2 F (36.2 C), temperature source Oral, resp. rate 16, height 5' 0.5" (1.537 m), weight 51.256 kg (113 lb). Body mass index is 21.7 kg/(m^2). Lab Results:   No results found for this or any previous visit (from the past 72 hour(s)).  Physical Findings: AIMS: Facial and Oral Movements Muscles of Facial Expression: None, normal Lips and Perioral Area: None, normal Jaw: None, normal Tongue: None, normal,Extremity Movements Upper (arms, wrists, hands, fingers): None, normal Lower (legs, knees, ankles, toes): None, normal, Trunk Movements Neck, shoulders, hips: None, normal, Overall Severity Severity of abnormal movements (highest score from questions above): None, normal Incapacitation due to abnormal movements: None, normal Patient's awareness of abnormal movements (rate only patient's report): No Awareness, Dental Status Current problems with teeth and/or dentures?: No Does patient usually wear dentures?: No  CIWA:  CIWA-Ar  Total: 0 COWS:     Psychiatric Specialty Exam: See Psychiatric Specialty Exam and Suicide Risk Assessment completed by Attending Physician prior to discharge.  Discharge destination:  Home  Is patient on multiple antipsychotic therapies at discharge:  No   Has Patient had three or more failed trials of antipsychotic monotherapy by history:  No  Recommended Plan for Multiple Antipsychotic Therapies: Additional reason(s) for multiple antispychotic treatment:  na      Medication List    STOP taking these medications       aspirin-acetaminophen-caffeine 250-250-65 MG per tablet  Commonly known as:  EXCEDRIN MIGRAINE     LORazepam 1 MG tablet  Commonly known as:  ATIVAN     promethazine 12.5 MG tablet  Commonly known as:  PHENERGAN      TAKE these medications     Indication   sertraline 25 MG tablet  Commonly known as:  ZOLOFT  Take 1 tablet (25 mg total) by mouth daily. Depression/anxiety            Follow-up Information   Follow up with [facility]. (Call number provided to schedule an appointment after discharge with Jasmine December, the therapist.  She charges an affordable sliding fee scale. )    Contact information:   [address] [place], [place] [address] phone: [phone]   fax: [phone]      Follow up with [facility] On 07/03/2012. (Arrive between 8 and 10AM on Tuesday, 07/03/12 for appointment.)    Contact information:   [address]   [place], [place] [address] phone  [phone] fax [phone]      Follow-up recommendations:  Other:  out pt psy  Comments:  none  Total Discharge Time:  Less than 30 minutes.  Signed: Wonda Cerise 08/18/2012, 10:58 AM

## 2012-09-04 ENCOUNTER — Encounter (HOSPITAL_COMMUNITY): Payer: Self-pay | Admitting: *Deleted

## 2012-09-04 ENCOUNTER — Emergency Department (HOSPITAL_COMMUNITY)
Admission: EM | Admit: 2012-09-04 | Discharge: 2012-09-04 | Discharge status: Against Medical Advice | Payer: Self-pay | Attending: Emergency Medicine | Admitting: Emergency Medicine

## 2012-09-04 DIAGNOSIS — Z862 Personal history of diseases of the blood and blood-forming organs and certain disorders involving the immune mechanism: Secondary | ICD-10-CM | POA: Insufficient documentation

## 2012-09-04 DIAGNOSIS — R6883 Chills (without fever): Secondary | ICD-10-CM | POA: Insufficient documentation

## 2012-09-04 DIAGNOSIS — R350 Frequency of micturition: Secondary | ICD-10-CM | POA: Insufficient documentation

## 2012-09-04 DIAGNOSIS — R300 Dysuria: Secondary | ICD-10-CM | POA: Insufficient documentation

## 2012-09-04 DIAGNOSIS — Z9889 Other specified postprocedural states: Secondary | ICD-10-CM | POA: Insufficient documentation

## 2012-09-04 DIAGNOSIS — Z792 Long term (current) use of antibiotics: Secondary | ICD-10-CM | POA: Insufficient documentation

## 2012-09-04 DIAGNOSIS — Z3202 Encounter for pregnancy test, result negative: Secondary | ICD-10-CM | POA: Insufficient documentation

## 2012-09-04 DIAGNOSIS — Z8639 Personal history of other endocrine, nutritional and metabolic disease: Secondary | ICD-10-CM | POA: Insufficient documentation

## 2012-09-04 DIAGNOSIS — R112 Nausea with vomiting, unspecified: Secondary | ICD-10-CM | POA: Insufficient documentation

## 2012-09-04 DIAGNOSIS — R3915 Urgency of urination: Secondary | ICD-10-CM | POA: Insufficient documentation

## 2012-09-04 DIAGNOSIS — R197 Diarrhea, unspecified: Secondary | ICD-10-CM | POA: Insufficient documentation

## 2012-09-04 DIAGNOSIS — Z8742 Personal history of other diseases of the female genital tract: Secondary | ICD-10-CM | POA: Insufficient documentation

## 2012-09-04 DIAGNOSIS — Z8659 Personal history of other mental and behavioral disorders: Secondary | ICD-10-CM | POA: Insufficient documentation

## 2012-09-04 DIAGNOSIS — R109 Unspecified abdominal pain: Secondary | ICD-10-CM | POA: Insufficient documentation

## 2012-09-04 LAB — URINALYSIS, ROUTINE W REFLEX MICROSCOPIC
Bilirubin Urine: NEGATIVE
Ketones, ur: 40 mg/dL — AB
Protein, ur: NEGATIVE mg/dL
Specific Gravity, Urine: <1.005 — ABNORMAL LOW (ref 1.005–1.030)
Urobilinogen, UA: 0.2 mg/dL (ref 0.0–1.0)

## 2012-09-04 LAB — URINE MICROSCOPIC-ADD ON

## 2012-09-04 NOTE — ED Provider Notes (Signed)
History    This chart was scribed for Ward Givens, MD by Marlyne Beards, ED Scribe. The patient was seen in room [facility]. Patient's care was started at 10:15 PM.    CSN: [id number]  Arrival date & time 09/04/12  1906      Chief Complaint  Patient presents with  . Flank Pain    (Consider location/radiation/quality/duration/timing/severity/associated sxs/prior treatment) Patient is a 42 y.o. female presenting with flank pain. The history is provided by the patient. No language interpreter was used.  Flank Pain   Monique Nguyen is a 42 y.o. female who presents to the Emergency Department complaining of moderate constant left flank and  lower abdominal pain onset a couple days ago (3/23). Pt states the pain is over her bladder. Pt threw up yesterday once. Pt has had diarrhea for the past two weeks about twice a day and went once today. Patient reports her last normal period was about 6 months ago. She states her gynecologist thinks it may be because of stress. She states she was regular before that. Patient states she has frequency, urgency, but denies dysuria or hematuria. She states her urine does have a foul odor. She states she vomited once 2 nights ago. She states it feels just like prior UTIs. Patient also states she used to have a history of interstitial cystitis.   Pt reports she is unemployed and receives a widows check. Pt has surgical hx of 3 bladder surgeries due to interstitial cystitis . Pt says that she very seldomly gets her period and does not believe she is pregnant.   PCP Dr Phillips Odor    Past Medical History  Diagnosis Date  . GERD (gastroesophageal reflux disease)   . Bacterial vaginosis   . Anxiety   . Depression   . Panic attacks   . Psoriasis     Past Surgical History  Procedure Laterality Date  . Bladder surgery      X 3    Family History  Problem Relation Age of Onset  . Depression Mother     living  . Pancreatic cancer Father     living     History  Substance Use Topics  . Smoking status: Current Every Day Smoker -- 1.50 packs/day    Types: Cigarettes  . Smokeless tobacco: Not on file  . Alcohol Use: 1.8 oz/week    3 Cans of beer per week  unemployed  OB History   Grav Para Term Preterm Abortions TAB SAB Ect Mult Living                  Review of Systems  Constitutional: Positive for chills.  Gastrointestinal: Positive for nausea, vomiting and diarrhea.  Genitourinary: Positive for dysuria and flank pain.    Allergies  Doxycycline and Sulfa antibiotics  Home Medications   Current Outpatient Rx  Name  Route  Sig  Dispense  Refill   Ativan 1 mg TID   BP 98/68  Pulse 115  Temp(Src) 98.6 F (37 C) (Oral)  Resp 20  Ht 5\' 1"  (1.549 m)  Wt 113 lb (51.256 kg)  BMI 21.36 kg/m2  SpO2 100%  LMP 09/04/2012  Vital signs normal    Physical Exam  Nursing note and vitals reviewed. Constitutional: She is oriented to person, place, and time. She appears well-developed and well-nourished.  Non-toxic appearance. She does not appear ill. No distress.  HENT:  Head: Normocephalic and atraumatic.  Right Ear: External ear normal.  Left Ear: External  ear normal.  Nose: Nose normal. No mucosal edema or rhinorrhea.  Mouth/Throat: Oropharynx is clear and moist and mucous membranes are normal. No dental abscesses or edematous.  Eyes: Conjunctivae and EOM are normal. Pupils are equal, round, and reactive to light.  Neck: Normal range of motion and full passive range of motion without pain. Neck supple.  Cardiovascular: Normal rate, regular rhythm and normal heart sounds.  Exam reveals no gallop and no friction rub.   No murmur heard. Pulmonary/Chest: Effort normal and breath sounds normal. No respiratory distress. She has no wheezes. She has no rhonchi. She has no rales. She exhibits no tenderness and no crepitus.  Abdominal: Soft. Normal appearance and bowel sounds are normal. She exhibits no distension. There is  tenderness. There is no rebound and no guarding.    Lower abdominal tenderness upon palpation.   Musculoskeletal: Normal range of motion. She exhibits tenderness. She exhibits no edema.       Back:  Tenderness upon palpation over the SIJ. Non tender in the flanks bilateral.  Neurological: She is alert and oriented to person, place, and time. She has normal strength. No cranial nerve deficit.  Skin: Skin is warm, dry and intact. No rash noted. No erythema. No pallor.  Psychiatric: She has a normal mood and affect. Her speech is normal and behavior is normal. Her mood appears not anxious.    ED Course  Procedures (including critical care time) DIAGNOSTIC STUDIES: Oxygen Saturation is 100% on room air, normal by my interpretation.    COORDINATION OF CARE: 10:29 PM Discussed ED treatment with pt and pt agrees.   Discussed results of patient's urinalysis. Specifically  that it was normal. We discussed she needed a pelvic exam. She states she just had one a couple weeks ago by her GYN. Patient left AMA before I could perform pelvic exam.  Results for orders placed during the hospital encounter of 09/04/12  URINALYSIS, ROUTINE W REFLEX MICROSCOPIC      Result Value Range   Color, Urine YELLOW  YELLOW   APPearance CLEAR  CLEAR   Specific Gravity, Urine <1.005 (*) 1.005 - 1.030   pH 7.0  5.0 - 8.0   Glucose, UA NEGATIVE  NEGATIVE mg/dL   Hgb urine dipstick TRACE (*) NEGATIVE   Bilirubin Urine NEGATIVE  NEGATIVE   Ketones, ur 40 (*) NEGATIVE mg/dL   Protein, ur NEGATIVE  NEGATIVE mg/dL   Urobilinogen, UA 0.2  0.0 - 1.0 mg/dL   Nitrite NEGATIVE  NEGATIVE   Leukocytes, UA NEGATIVE  NEGATIVE  PREGNANCY, URINE      Result Value Range   Preg Test, Ur NEGATIVE  NEGATIVE  URINE MICROSCOPIC-ADD ON      Result Value Range   Squamous Epithelial / LPF FEW (*) RARE   RBC / HPF 0-2  <3 RBC/hpf   Laboratory interpretation all normal    1. Abdominal pain    Pt left AMA   Devoria Albe, MD,  FACEP    MDM  I personally performed the services described in this documentation, which was scribed in my presence. The recorded information has been reviewed and considered.  Devoria Albe, MD, FACEP          Ward Givens, MD 09/05/12 [phone]

## 2012-09-04 NOTE — ED Notes (Signed)
Upon entering room patient and family gone gown lying in chair. Unable to locate patient.

## 2012-09-04 NOTE — ED Notes (Signed)
Pain lt flank , and low abd, vomiting, diarrhea.  Chills, No fever.

## 2012-09-06 ENCOUNTER — Encounter (HOSPITAL_COMMUNITY): Payer: Self-pay

## 2012-09-06 ENCOUNTER — Emergency Department (HOSPITAL_COMMUNITY)
Admission: EM | Admit: 2012-09-06 | Discharge: 2012-09-06 | Disposition: A | Discharge status: Home / Self Care | Payer: Self-pay | Attending: Emergency Medicine | Admitting: Emergency Medicine

## 2012-09-06 DIAGNOSIS — Z8719 Personal history of other diseases of the digestive system: Secondary | ICD-10-CM | POA: Insufficient documentation

## 2012-09-06 DIAGNOSIS — R112 Nausea with vomiting, unspecified: Secondary | ICD-10-CM | POA: Insufficient documentation

## 2012-09-06 DIAGNOSIS — Y939 Activity, unspecified: Secondary | ICD-10-CM | POA: Insufficient documentation

## 2012-09-06 DIAGNOSIS — F411 Generalized anxiety disorder: Secondary | ICD-10-CM | POA: Insufficient documentation

## 2012-09-06 DIAGNOSIS — R6883 Chills (without fever): Secondary | ICD-10-CM | POA: Insufficient documentation

## 2012-09-06 DIAGNOSIS — R630 Anorexia: Secondary | ICD-10-CM | POA: Insufficient documentation

## 2012-09-06 DIAGNOSIS — Z8659 Personal history of other mental and behavioral disorders: Secondary | ICD-10-CM | POA: Insufficient documentation

## 2012-09-06 DIAGNOSIS — F17200 Nicotine dependence, unspecified, uncomplicated: Secondary | ICD-10-CM | POA: Insufficient documentation

## 2012-09-06 DIAGNOSIS — Z8619 Personal history of other infectious and parasitic diseases: Secondary | ICD-10-CM | POA: Insufficient documentation

## 2012-09-06 DIAGNOSIS — M545 Low back pain, unspecified: Secondary | ICD-10-CM | POA: Insufficient documentation

## 2012-09-06 DIAGNOSIS — R109 Unspecified abdominal pain: Secondary | ICD-10-CM | POA: Insufficient documentation

## 2012-09-06 DIAGNOSIS — F410 Panic disorder [episodic paroxysmal anxiety] without agoraphobia: Secondary | ICD-10-CM | POA: Insufficient documentation

## 2012-09-06 DIAGNOSIS — Z3202 Encounter for pregnancy test, result negative: Secondary | ICD-10-CM | POA: Insufficient documentation

## 2012-09-06 DIAGNOSIS — L539 Erythematous condition, unspecified: Secondary | ICD-10-CM | POA: Insufficient documentation

## 2012-09-06 DIAGNOSIS — L988 Other specified disorders of the skin and subcutaneous tissue: Secondary | ICD-10-CM | POA: Insufficient documentation

## 2012-09-06 DIAGNOSIS — Z872 Personal history of diseases of the skin and subcutaneous tissue: Secondary | ICD-10-CM | POA: Insufficient documentation

## 2012-09-06 DIAGNOSIS — Y929 Unspecified place or not applicable: Secondary | ICD-10-CM | POA: Insufficient documentation

## 2012-09-06 DIAGNOSIS — R197 Diarrhea, unspecified: Secondary | ICD-10-CM | POA: Insufficient documentation

## 2012-09-06 DIAGNOSIS — Z79899 Other long term (current) drug therapy: Secondary | ICD-10-CM | POA: Insufficient documentation

## 2012-09-06 DIAGNOSIS — R42 Dizziness and giddiness: Secondary | ICD-10-CM | POA: Insufficient documentation

## 2012-09-06 LAB — URINALYSIS, ROUTINE W REFLEX MICROSCOPIC
Bilirubin Urine: NEGATIVE
Glucose, UA: NEGATIVE mg/dL
Ketones, ur: 15 mg/dL — AB
pH: 5.5 (ref 5.0–8.0)

## 2012-09-06 LAB — CBC WITH DIFFERENTIAL/PLATELET
Basophils Absolute: 0 10*3/uL (ref 0.0–0.1)
Eosinophils Relative: 6 % — ABNORMAL HIGH (ref 0–5)
HCT: 38.2 % (ref 36.0–46.0)
Lymphocytes Relative: 12 % (ref 12–46)
MCV: 94.6 fL (ref 78.0–100.0)
Monocytes Absolute: 0.4 10*3/uL (ref 0.1–1.0)
Monocytes Relative: 5 % (ref 3–12)
RDW: 13.3 % (ref 11.5–15.5)
WBC: 7.2 10*3/uL (ref 4.0–10.5)

## 2012-09-06 LAB — URINE MICROSCOPIC-ADD ON

## 2012-09-06 LAB — PREGNANCY, URINE: Preg Test, Ur: NEGATIVE

## 2012-09-06 LAB — WET PREP, GENITAL: Yeast Wet Prep HPF POC: NONE SEEN

## 2012-09-06 MED ORDER — ONDANSETRON 8 MG PO TBDP
8.0000 mg | ORAL_TABLET | Freq: Once | ORAL | Status: AC
  Administered 2012-09-06: 8 mg via ORAL
  Filled 2012-09-06: qty 1

## 2012-09-06 MED ORDER — DIPHENHYDRAMINE HCL 25 MG PO CAPS
25.0000 mg | ORAL_CAPSULE | Freq: Once | ORAL | Status: AC
  Administered 2012-09-06: 25 mg via ORAL
  Filled 2012-09-06: qty 1

## 2012-09-06 MED ORDER — PROMETHAZINE HCL 25 MG PO TABS: 12.5000 mg | ORAL_TABLET | Freq: Four times a day (QID) | ORAL | Status: DC | PRN

## 2012-09-06 NOTE — ED Provider Notes (Signed)
History     CSN: [id number]  Arrival date & time 09/06/12  1816   First MD Initiated Contact with Patient 09/06/12 1841      Chief Complaint  Patient presents with  . Pelvic Pain  . Back Pain    (Consider location/radiation/quality/duration/timing/severity/associated sxs/prior treatment) Patient is a 42 y.o. female presenting with pelvic pain and back pain. The history is provided by the patient.  Pelvic Pain This is a new problem. The current episode started 1 to 4 weeks ago. The problem occurs constantly. The problem has been gradually worsening. Associated symptoms include abdominal pain, anorexia, chills, nausea and vomiting. Pertinent negatives include no congestion, coughing, diaphoresis, fatigue, fever, headaches, myalgias, neck pain, numbness, rash, sore throat, swollen glands or weakness. Nothing aggravates the symptoms. She has tried NSAIDs for the symptoms. The treatment provided no relief.  Back Pain Associated symptoms: abdominal pain and pelvic pain   Associated symptoms: no dysuria, no fever, no headaches, no numbness and no weakness    Patient was here 09/04/12 and left before evaluation was complete.  She reports pelvic pain with radiation to her back that started over a week ago. She has had nausea and vomiting past day or so. She has had diarrhea but that is normal because of her nerves. She also complains of a red area on her left eye lid that she thinks is a bug bite.   Past Medical History  Diagnosis Date  . GERD (gastroesophageal reflux disease)   . Bacterial vaginosis   . Anxiety   . Depression   . Panic attacks   . Psoriasis     Past Surgical History  Procedure Laterality Date  . Bladder surgery      X 3    Family History  Problem Relation Age of Onset  . Depression Mother     living  . Pancreatic cancer Father     living    History  Substance Use Topics  . Smoking status: Current Every Day Smoker -- 1.50 packs/day    Types: Cigarettes  .  Smokeless tobacco: Not on file  . Alcohol Use: 1.8 oz/week    3 Cans of beer per week     Comment: occ    OB History   Grav Para Term Preterm Abortions TAB SAB Ect Mult Living                  Review of Systems  Constitutional: Positive for chills. Negative for fever, diaphoresis and fatigue.  HENT: Negative for ear pain, congestion, sore throat, facial swelling, neck pain and neck stiffness.   Eyes: Negative for photophobia, pain and discharge.  Respiratory: Negative for cough, chest tightness and wheezing.   Gastrointestinal: Positive for nausea, vomiting, abdominal pain and anorexia. Negative for diarrhea, constipation and abdominal distention.  Genitourinary: Positive for pelvic pain. Negative for dysuria, urgency, frequency, flank pain, vaginal bleeding, vaginal discharge and difficulty urinating.  Musculoskeletal: Positive for back pain. Negative for myalgias and gait problem.  Skin: Negative for color change and rash.  Neurological: Positive for dizziness. Negative for speech difficulty, weakness, light-headedness, numbness and headaches.  Psychiatric/Behavioral: Negative for confusion and agitation. Nervous/anxious: hx of anxiety and depression.     Allergies  Doxycycline and Sulfa antibiotics  Home Medications   Current Outpatient Rx  Name  Route  Sig  Dispense  Refill  . acetaminophen (TYLENOL) 500 MG tablet   Oral   Take 500 mg by mouth daily as needed for pain.         Marland Kitchen  LORazepam (ATIVAN) 1 MG tablet   Oral   Take 1 mg by mouth 3 (three) times daily.           LMP 07/27/2012  Physical Exam  Nursing note and vitals reviewed. Constitutional: She is oriented to person, place, and time. No distress.  Thin W/F  HENT:  Head:    Raised red area with tiny black center noted left forehead. C/w insect bite. There is mild edema and erythema of the left eye lid. No signs of infection noted.  Eyes: Conjunctivae and EOM are normal. Pupils are equal, round, and  reactive to light.  Neck: Neck supple.  Cardiovascular: Normal rate and regular rhythm.   Pulmonary/Chest: No respiratory distress. She has no wheezes.  Abdominal: Soft. Bowel sounds are increased. There is tenderness in the right lower quadrant, suprapubic area and left lower quadrant. There is no rigidity, no rebound, no guarding and no CVA tenderness.    Tenderness with palpation lower abdomen  Genitourinary:  External genitalia without lesions. Yellow mucous  discharge vaginal vault. Positive CMT, mild bilateral adnexal tenderness. Uterus without palpable enlargement.  Musculoskeletal: Normal range of motion. She exhibits no edema.  Neurological: She is alert and oriented to person, place, and time. No cranial nerve deficit.  Skin: Skin is warm and dry.  Psychiatric: She has a normal mood and affect.   Results for orders placed during the hospital encounter of 09/06/12 (from the past 24 hour(s))  URINALYSIS, ROUTINE W REFLEX MICROSCOPIC     Status: Abnormal   Collection Time    09/06/12  7:20 PM      Result Value Range   Color, Urine YELLOW  YELLOW   APPearance CLEAR  CLEAR   Specific Gravity, Urine <1.005 (*) 1.005 - 1.030   pH 5.5  5.0 - 8.0   Glucose, UA NEGATIVE  NEGATIVE mg/dL   Hgb urine dipstick TRACE (*) NEGATIVE   Bilirubin Urine NEGATIVE  NEGATIVE   Ketones, ur 15 (*) NEGATIVE mg/dL   Protein, ur NEGATIVE  NEGATIVE mg/dL   Urobilinogen, UA 0.2  0.0 - 1.0 mg/dL   Nitrite NEGATIVE  NEGATIVE   Leukocytes, UA NEGATIVE  NEGATIVE  URINE MICROSCOPIC-ADD ON     Status: Abnormal   Collection Time    09/06/12  7:20 PM      Result Value Range   Squamous Epithelial / LPF FEW (*) RARE   WBC, UA 0-2  <3 WBC/hpf   RBC / HPF 0-2  <3 RBC/hpf   Bacteria, UA FEW (*) RARE  CBC WITH DIFFERENTIAL     Status: Abnormal   Collection Time    09/06/12  7:24 PM      Result Value Range   WBC 7.2  4.0 - 10.5 K/uL   RBC 4.04  3.87 - 5.11 MIL/uL   Hemoglobin 13.5  12.0 - 15.0 g/dL   HCT  16.1  09.6 - 04.5 %   MCV 94.6  78.0 - 100.0 fL   MCH 33.4  26.0 - 34.0 pg   MCHC 35.3  30.0 - 36.0 g/dL   RDW 40.9  81.1 - 91.4 %   Platelets 299  150 - 400 K/uL   Neutrophils Relative 77  43 - 77 %   Neutro Abs 5.5  1.7 - 7.7 K/uL   Lymphocytes Relative 12  12 - 46 %   Lymphs Abs 0.9  0.7 - 4.0 K/uL   Monocytes Relative 5  3 - 12 %   Monocytes  Absolute 0.4  0.1 - 1.0 K/uL   Eosinophils Relative 6 (*) 0 - 5 %   Eosinophils Absolute 0.4  0.0 - 0.7 K/uL   Basophils Relative 0  0 - 1 %   Basophils Absolute 0.0  0.0 - 0.1 K/uL  WET PREP, GENITAL     Status: Abnormal   Collection Time    09/06/12  7:47 PM      Result Value Range   Yeast Wet Prep HPF POC NONE SEEN  NONE SEEN   Trich, Wet Prep NONE SEEN  NONE SEEN   Clue Cells Wet Prep HPF POC NONE SEEN  NONE SEEN   WBC, Wet Prep HPF POC FEW (*) NONE SEEN    Assessment: 42 y.o. female with pelvic pain   Nausea, vomiting and diarrhea   Insect bit with allergic reaction  Plan:  Clear liquids tonight then advance to B.R.A.T.   Benadryl for allergic reaction   Follow up with PCP, return here as needed    ED Course  Procedures  MDM  I have reviewed this patient's vital signs, nurses notes, appropriate labs and discussed findings and plan of care with the patient. Patient voices understanding. Return tomorrow for pelvic ultrasound.         [place], [place] 09/06/12 2104

## 2012-09-06 NOTE — ED Provider Notes (Signed)
Medical screening examination/treatment/procedure(s) were performed by non-physician practitioner and as supervising physician I was immediately available for consultation/collaboration.  Donnetta Hutching, MD 09/06/12 2205

## 2012-09-06 NOTE — ED Notes (Signed)
Pt reports "pelvic pain " and back pain for over a week, foul odor to her urine, was in the er several days ago and left.  +nausea and vomiting, no diarrhea or fever.  Reports cold chills.   Also has ?bug bite to right eye and "squeezed on it" would like for it to be checked.

## 2012-09-07 ENCOUNTER — Other Ambulatory Visit (HOSPITAL_COMMUNITY): Payer: Self-pay | Admitting: Family Medicine

## 2012-09-07 ENCOUNTER — Ambulatory Visit (HOSPITAL_COMMUNITY): Payer: Self-pay

## 2012-09-07 DIAGNOSIS — R102 Pelvic and perineal pain: Secondary | ICD-10-CM

## 2012-09-08 ENCOUNTER — Emergency Department (HOSPITAL_COMMUNITY)
Admission: EM | Admit: 2012-09-08 | Discharge: 2012-09-08 | Disposition: A | Discharge status: Home / Self Care | Payer: Self-pay | Attending: Emergency Medicine | Admitting: Emergency Medicine

## 2012-09-08 ENCOUNTER — Encounter (HOSPITAL_COMMUNITY): Payer: Self-pay | Admitting: *Deleted

## 2012-09-08 DIAGNOSIS — Z872 Personal history of diseases of the skin and subcutaneous tissue: Secondary | ICD-10-CM | POA: Insufficient documentation

## 2012-09-08 DIAGNOSIS — F410 Panic disorder [episodic paroxysmal anxiety] without agoraphobia: Secondary | ICD-10-CM | POA: Insufficient documentation

## 2012-09-08 DIAGNOSIS — F411 Generalized anxiety disorder: Secondary | ICD-10-CM | POA: Insufficient documentation

## 2012-09-08 DIAGNOSIS — Z8719 Personal history of other diseases of the digestive system: Secondary | ICD-10-CM | POA: Insufficient documentation

## 2012-09-08 DIAGNOSIS — Z8659 Personal history of other mental and behavioral disorders: Secondary | ICD-10-CM | POA: Insufficient documentation

## 2012-09-08 DIAGNOSIS — L089 Local infection of the skin and subcutaneous tissue, unspecified: Secondary | ICD-10-CM | POA: Insufficient documentation

## 2012-09-08 DIAGNOSIS — Z79899 Other long term (current) drug therapy: Secondary | ICD-10-CM | POA: Insufficient documentation

## 2012-09-08 DIAGNOSIS — Z8619 Personal history of other infectious and parasitic diseases: Secondary | ICD-10-CM | POA: Insufficient documentation

## 2012-09-08 DIAGNOSIS — F17200 Nicotine dependence, unspecified, uncomplicated: Secondary | ICD-10-CM | POA: Insufficient documentation

## 2012-09-08 LAB — GC/CHLAMYDIA PROBE AMP: GC Probe RNA: NEGATIVE

## 2012-09-08 MED ORDER — MORPHINE SULFATE 4 MG/ML IJ SOLN
4.0000 mg | Freq: Once | INTRAMUSCULAR | Status: AC
  Administered 2012-09-08: 4 mg via INTRAVENOUS
  Filled 2012-09-08 (×2): qty 1

## 2012-09-08 MED ORDER — SULFAMETHOXAZOLE-TRIMETHOPRIM 800-160 MG PO TABS: 1.0000 | ORAL_TABLET | Freq: Two times a day (BID) | ORAL | Status: DC

## 2012-09-08 MED ORDER — SODIUM CHLORIDE 0.9 % IV BOLUS (SEPSIS)
500.0000 mL | Freq: Once | INTRAVENOUS | Status: AC
  Administered 2012-09-08: 500 mL via INTRAVENOUS

## 2012-09-08 MED ORDER — VANCOMYCIN HCL IN DEXTROSE 1-5 GM/200ML-% IV SOLN
1000.0000 mg | Freq: Once | INTRAVENOUS | Status: AC
  Administered 2012-09-08: 1000 mg via INTRAVENOUS
  Filled 2012-09-08 (×2): qty 200

## 2012-09-08 MED ORDER — KETOROLAC TROMETHAMINE 30 MG/ML IJ SOLN
30.0000 mg | Freq: Once | INTRAMUSCULAR | Status: AC
  Administered 2012-09-08: 30 mg via INTRAVENOUS
  Filled 2012-09-08 (×2): qty 1

## 2012-09-08 NOTE — ED Notes (Addendum)
Pt c/o insect bite to left eye, was seen in er two days ago, states that the swelling has become worse, chills,

## 2012-09-08 NOTE — ED Notes (Signed)
Discharge instructions reviewed with pt, questions answered. Pt verbalized understanding.  

## 2012-09-08 NOTE — ED Notes (Signed)
Per pt, HR is usually around 110, verified with past few charts.

## 2012-09-08 NOTE — ED Provider Notes (Signed)
History  This chart was scribed for Raeford Razor, MD by Shari Heritage, ED Scribe. The patient was seen in room APA17/AP[facility]17. Patient's care was started at 1651.   CSN: [id number]  Arrival date & time 09/08/12  1413   First MD Initiated Contact with Patient 09/08/12 1651      Chief Complaint  Patient presents with  . Insect Bite     Patient is a 42 y.o. female presenting with rash.  Rash Location:  Face Facial rash location:  L eyelid and L eye Quality: painful, redness and swelling   Pain details:    Severity:  Moderate   Onset quality:  Gradual   Duration:  1 week   Timing:  Constant Severity:  Moderate Onset quality:  Gradual Duration:  1 week Timing:  Constant Chronicity:  New Context: insect bite/sting   Relieved by:  Nothing    HPI Comments: Monique Nguyen is a 42 y.o. female who presents to the Emergency Department complaining of moderate to severe, constant pain to left eye and surrounding area with associated worsening swelling onset 1 week ago. Patient thinks that she was bitten by a spider, but she did not actually see anything bite her. She states that she woke up with these symptoms 1 week ago and yesterday swelling worsened significantly.  Patient denies any visual changes. Per medical records, patient was seen here on 09/06/2012 complaining a reddened area on her left eye lid. Upon exam, area as noted as a raised area with tiny black center consistent with an insect bite with mild edema and erythema of the left eye lid. Patient states that she was advised to apply ice to reduce swelling but was not prescribed any antibiotics or other medicines to treat.    Past Medical History  Diagnosis Date  . GERD (gastroesophageal reflux disease)   . Bacterial vaginosis   . Anxiety   . Depression   . Panic attacks   . Psoriasis     Past Surgical History  Procedure Laterality Date  . Bladder surgery      X 3    Family History  Problem Relation Age of Onset  .  Depression Mother     living  . Pancreatic cancer Father     living    History  Substance Use Topics  . Smoking status: Current Every Day Smoker -- 1.50 packs/day    Types: Cigarettes  . Smokeless tobacco: Not on file  . Alcohol Use: 1.8 oz/week    3 Cans of beer per week     Comment: occ    OB History   Grav Para Term Preterm Abortions TAB SAB Ect Mult Living                  Review of Systems  Skin: Positive for rash.    Allergies  Doxycycline and Sulfa antibiotics  Home Medications   Current Outpatient Rx  Name  Route  Sig  Dispense  Refill  . doxylamine, Sleep, (SLEEP AID) 25 MG tablet   Oral   Take 25 mg by mouth at bedtime as needed for sleep.         Marland Kitchen LORazepam (ATIVAN) 1 MG tablet   Oral   Take 1 mg by mouth 3 (three) times daily.         . promethazine (PHENERGAN) 25 MG tablet   Oral   Take 0.5 tablets (12.5 mg total) by mouth every 6 (six) hours as needed for nausea.  20 tablet   0   . acetaminophen (TYLENOL) 500 MG tablet   Oral   Take 500 mg by mouth daily as needed for pain.           Triage Vitals: BP 92/70  Pulse 120  Temp(Src) 98.4 F (36.9 C) (Oral)  Resp 20  SpO2 98%  LMP 07/27/2012  Physical Exam  Nursing note and vitals reviewed. Constitutional: She appears well-developed and well-nourished. No distress.  HENT:  Head: Normocephalic and atraumatic.  Above left eye, Dime sized erythematous lesion, indurated. No fluctuance. Punctate dark spot in the center of the lesion, no drainage. Edema of the lower eyelid.   Eyes: Conjunctivae and EOM are normal. Right eye exhibits no discharge. Left eye exhibits no discharge.  EOM function intact. No proptosis.   Neck: Neck supple.  Cardiovascular: Normal rate, regular rhythm and normal heart sounds.  Exam reveals no gallop and no friction rub.   No murmur heard. Pulmonary/Chest: Effort normal and breath sounds normal. No respiratory distress.  Abdominal: Soft. She exhibits no  distension. There is no tenderness.  Musculoskeletal: She exhibits no edema and no tenderness.  Neurological: She is alert.  Skin: Skin is warm and dry.  Psychiatric: She has a normal mood and affect. Her behavior is normal. Thought content normal.    ED Course  Procedures (including critical care time) DIAGNOSTIC STUDIES: Oxygen Saturation is 98% on room air, normal by my interpretation.    COORDINATION OF CARE: 5:11 PM- Patient informed of current plan for treatment and evaluation and agrees with plan at this time.      Labs Reviewed - No data to display No results found.   1. Infected skin lesion       MDM  41yF with small indurated lesion above L eyebrow. No drainable collection. Swelling of lower eyelid w/o tenderness in this area. No proptosis or pain with EOM movement. Will dose of IV abx. My suspicion for extension of possible infectious process beyond small area of cutaneous involvement of forehead in low. Pt has allergy listed to sulfa, but when discussing sounds more consistent with SE as opposed to true allergy. Course of bactrim. Return precautions discussed. Outpt FU otherwise.      I personally preformed the services scribed in my presence. The recorded information has been reviewed is accurate. Raeford Razor, MD.    Raeford Razor, MD 09/12/12 [phone]

## 2012-09-09 ENCOUNTER — Inpatient Hospital Stay (HOSPITAL_COMMUNITY)
Admission: EM | Admit: 2012-09-09 | Discharge: 2012-09-12 | DRG: 603 | Disposition: A | Discharge status: Home / Self Care | Payer: Self-pay | Attending: Family Medicine | Admitting: Family Medicine

## 2012-09-09 ENCOUNTER — Encounter (HOSPITAL_COMMUNITY): Payer: Self-pay | Admitting: *Deleted

## 2012-09-09 DIAGNOSIS — K863 Pseudocyst of pancreas: Secondary | ICD-10-CM | POA: Diagnosis present

## 2012-09-09 DIAGNOSIS — K219 Gastro-esophageal reflux disease without esophagitis: Secondary | ICD-10-CM | POA: Diagnosis present

## 2012-09-09 DIAGNOSIS — R197 Diarrhea, unspecified: Secondary | ICD-10-CM

## 2012-09-09 DIAGNOSIS — F410 Panic disorder [episodic paroxysmal anxiety] without agoraphobia: Secondary | ICD-10-CM | POA: Diagnosis present

## 2012-09-09 DIAGNOSIS — K589 Irritable bowel syndrome without diarrhea: Secondary | ICD-10-CM | POA: Diagnosis present

## 2012-09-09 DIAGNOSIS — R109 Unspecified abdominal pain: Secondary | ICD-10-CM | POA: Diagnosis present

## 2012-09-09 DIAGNOSIS — E876 Hypokalemia: Secondary | ICD-10-CM | POA: Diagnosis present

## 2012-09-09 DIAGNOSIS — F329 Major depressive disorder, single episode, unspecified: Secondary | ICD-10-CM

## 2012-09-09 DIAGNOSIS — F17200 Nicotine dependence, unspecified, uncomplicated: Secondary | ICD-10-CM | POA: Diagnosis present

## 2012-09-09 DIAGNOSIS — L408 Other psoriasis: Secondary | ICD-10-CM | POA: Diagnosis present

## 2012-09-09 DIAGNOSIS — L03211 Cellulitis of face: Secondary | ICD-10-CM | POA: Diagnosis present

## 2012-09-09 DIAGNOSIS — K862 Cyst of pancreas: Secondary | ICD-10-CM | POA: Diagnosis present

## 2012-09-09 DIAGNOSIS — L089 Local infection of the skin and subcutaneous tissue, unspecified: Principal | ICD-10-CM | POA: Diagnosis present

## 2012-09-09 DIAGNOSIS — R45851 Suicidal ideations: Secondary | ICD-10-CM

## 2012-09-09 DIAGNOSIS — L0201 Cutaneous abscess of face: Secondary | ICD-10-CM | POA: Diagnosis present

## 2012-09-09 DIAGNOSIS — Z882 Allergy status to sulfonamides status: Secondary | ICD-10-CM

## 2012-09-09 DIAGNOSIS — F419 Anxiety disorder, unspecified: Secondary | ICD-10-CM | POA: Diagnosis present

## 2012-09-09 DIAGNOSIS — F4329 Adjustment disorder with other symptoms: Secondary | ICD-10-CM

## 2012-09-09 DIAGNOSIS — F1310 Sedative, hypnotic or anxiolytic abuse, uncomplicated: Secondary | ICD-10-CM

## 2012-09-09 DIAGNOSIS — R110 Nausea: Secondary | ICD-10-CM

## 2012-09-09 DIAGNOSIS — Z720 Tobacco use: Secondary | ICD-10-CM | POA: Diagnosis present

## 2012-09-09 DIAGNOSIS — R1310 Dysphagia, unspecified: Secondary | ICD-10-CM | POA: Diagnosis present

## 2012-09-09 DIAGNOSIS — R1031 Right lower quadrant pain: Secondary | ICD-10-CM | POA: Diagnosis present

## 2012-09-09 DIAGNOSIS — F1020 Alcohol dependence, uncomplicated: Secondary | ICD-10-CM

## 2012-09-09 DIAGNOSIS — Z881 Allergy status to other antibiotic agents status: Secondary | ICD-10-CM

## 2012-09-09 DIAGNOSIS — F32A Depression, unspecified: Secondary | ICD-10-CM | POA: Diagnosis present

## 2012-09-09 DIAGNOSIS — N898 Other specified noninflammatory disorders of vagina: Secondary | ICD-10-CM

## 2012-09-09 DIAGNOSIS — F411 Generalized anxiety disorder: Secondary | ICD-10-CM | POA: Diagnosis present

## 2012-09-09 DIAGNOSIS — W57XXXA Bitten or stung by nonvenomous insect and other nonvenomous arthropods, initial encounter: Secondary | ICD-10-CM | POA: Diagnosis present

## 2012-09-09 DIAGNOSIS — F3289 Other specified depressive episodes: Secondary | ICD-10-CM | POA: Diagnosis present

## 2012-09-09 DIAGNOSIS — R6881 Early satiety: Secondary | ICD-10-CM | POA: Diagnosis present

## 2012-09-09 DIAGNOSIS — R51 Headache: Secondary | ICD-10-CM | POA: Diagnosis present

## 2012-09-09 DIAGNOSIS — Z23 Encounter for immunization: Secondary | ICD-10-CM

## 2012-09-09 LAB — BASIC METABOLIC PANEL
CO2: 28 mEq/L (ref 19–32)
GFR calc non Af Amer: >90 mL/min (ref >90)
Glucose, Bld: 105 mg/dL — ABNORMAL HIGH (ref 70–99)
Potassium: 2.9 mEq/L — ABNORMAL LOW (ref 3.5–5.1)
Sodium: 140 mEq/L (ref 135–145)

## 2012-09-09 LAB — CBC WITH DIFFERENTIAL/PLATELET
Basophils Absolute: 0 10*3/uL (ref 0.0–0.1)
Lymphocytes Relative: 14 % (ref 12–46)
Lymphs Abs: 1.3 10*3/uL (ref 0.7–4.0)
Neutrophils Relative %: 74 % (ref 43–77)
Platelets: 277 10*3/uL (ref 150–400)
RBC: 4.05 MIL/uL (ref 3.87–5.11)
WBC: 8.7 10*3/uL (ref 4.0–10.5)

## 2012-09-09 MED ORDER — DEXTROSE 5 % IV SOLN
1.0000 g | Freq: Once | INTRAVENOUS | Status: AC
  Administered 2012-09-10: 1 g via INTRAVENOUS
  Filled 2012-09-09: qty 10

## 2012-09-09 MED ORDER — ONDANSETRON HCL 4 MG/2ML IJ SOLN
4.0000 mg | Freq: Once | INTRAMUSCULAR | Status: AC
  Administered 2012-09-10: 4 mg via INTRAVENOUS
  Filled 2012-09-09: qty 2

## 2012-09-09 MED ORDER — VANCOMYCIN HCL IN DEXTROSE 1-5 GM/200ML-% IV SOLN
1000.0000 mg | Freq: Once | INTRAVENOUS | Status: AC
  Administered 2012-09-10: 1000 mg via INTRAVENOUS
  Filled 2012-09-09: qty 200

## 2012-09-09 MED ORDER — CEFTRIAXONE SODIUM 250 MG IJ SOLR: 250.0000 mg | Freq: Once | INTRAMUSCULAR | Status: DC

## 2012-09-09 MED ORDER — MORPHINE SULFATE 4 MG/ML IJ SOLN
2.0000 mg | Freq: Once | INTRAMUSCULAR | Status: AC
  Administered 2012-09-10: 2 mg via INTRAVENOUS
  Filled 2012-09-09: qty 1

## 2012-09-09 NOTE — ED Notes (Signed)
Pt was seen here for a spider bite above her her left eye. Pt states the swelling is now going to her right eye and she is unable to keep anything down.

## 2012-09-09 NOTE — ED Provider Notes (Signed)
History     This chart was scribed for [employer]. Colon Branch, MD, MD by Smitty Pluck, ED Scribe. The patient was seen in room APA16A/APA16A and the patient's care was started at 11:20 PM.   CSN: [id number]  Arrival date & time 09/09/12  2218      Chief Complaint  Patient presents with  . Insect Bite  . Facial Swelling     The history is provided by the patient. No language interpreter was used.   Monique Nguyen is a 42 y.o. female who presents to the Emergency Department complaining of facial swelling and pain due to possible spider bite to face above left eye 1 week ago. She reports that she has increased swelling of face since onset and symptoms are worsening. She was seen in [facility] 1 day ago for same symptoms and given bactrim but pt reports symptoms has not improved. Pt denies fever, chills, nausea, vomiting, diarrhea, weakness, cough, SOB and any other pain.   Past Medical History  Diagnosis Date  . GERD (gastroesophageal reflux disease)   . Bacterial vaginosis   . Anxiety   . Depression   . Panic attacks   . Psoriasis     Past Surgical History  Procedure Laterality Date  . Bladder surgery      X 3    Family History  Problem Relation Age of Onset  . Depression Mother     living  . Pancreatic cancer Father     living    History  Substance Use Topics  . Smoking status: Current Every Day Smoker -- 1.50 packs/day    Types: Cigarettes  . Smokeless tobacco: Not on file  . Alcohol Use: 1.8 oz/week    3 Cans of beer per week     Comment: occ    OB History   Grav Para Term Preterm Abortions TAB SAB Ect Mult Living                  Review of Systems  Constitutional: Negative for fever.       10 Systems reviewed and are negative for acute change except as noted in the HPI.  HENT: Negative for congestion.   Eyes: Negative for discharge and redness.  Respiratory: Negative for cough and shortness of breath.   Cardiovascular: Negative for chest pain.   Gastrointestinal: Negative for vomiting and abdominal pain.  Musculoskeletal: Negative for back pain.  Skin: Negative for rash.  Neurological: Negative for syncope, numbness and headaches.  Psychiatric/Behavioral:       No behavior change.   10 Systems reviewed and all are negative for acute change except as noted in the HPI.   Allergies  Doxycycline and Sulfa antibiotics  Home Medications   Current Outpatient Rx  Name  Route  Sig  Dispense  Refill  . acetaminophen (TYLENOL) 500 MG tablet   Oral   Take 500 mg by mouth daily as needed for pain.         Marland Kitchen doxylamine, Sleep, (SLEEP AID) 25 MG tablet   Oral   Take 25 mg by mouth at bedtime as needed for sleep.         Marland Kitchen LORazepam (ATIVAN) 1 MG tablet   Oral   Take 1 mg by mouth 3 (three) times daily.         . promethazine (PHENERGAN) 25 MG tablet   Oral   Take 0.5 tablets (12.5 mg total) by mouth every 6 (six) hours as needed for  nausea.   20 tablet   0   . sulfamethoxazole-trimethoprim (SEPTRA DS) 800-160 MG per tablet   Oral   Take 1 tablet by mouth every 12 (twelve) hours.   14 tablet   0     LMP 07/27/2012  Physical Exam  Nursing note and vitals reviewed. Constitutional: She is oriented to person, place, and time. She appears well-developed and well-nourished. No distress.  HENT:  Head: Normocephalic.  Insect bite over area left eyebrow with subsequent swelling and erythema to left side of face.   Eyes: Conjunctivae are normal.  Neck: Normal range of motion. Neck supple.  Cardiovascular: Normal rate and normal heart sounds.   Pulmonary/Chest: Effort normal and breath sounds normal. No respiratory distress.  Neurological: She is alert and oriented to person, place, and time.  Skin: Skin is warm and dry.  Psychiatric: She has a normal mood and affect. Her behavior is normal.    ED Course  Procedures (including critical care time) Results for orders placed during the hospital encounter of 09/09/12   CBC WITH DIFFERENTIAL      Result Value Range   WBC 8.7  4.0 - 10.5 K/uL   RBC 4.05  3.87 - 5.11 MIL/uL   Hemoglobin 13.2  12.0 - 15.0 g/dL   HCT 16.1  09.6 - 04.5 %   MCV 94.3  78.0 - 100.0 fL   MCH 32.6  26.0 - 34.0 pg   MCHC 34.6  30.0 - 36.0 g/dL   RDW 40.9  81.1 - 91.4 %   Platelets 277  150 - 400 K/uL   Neutrophils Relative 74  43 - 77 %   Neutro Abs 6.4  1.7 - 7.7 K/uL   Lymphocytes Relative 14  12 - 46 %   Lymphs Abs 1.3  0.7 - 4.0 K/uL   Monocytes Relative 8  3 - 12 %   Monocytes Absolute 0.7  0.1 - 1.0 K/uL   Eosinophils Relative 4  0 - 5 %   Eosinophils Absolute 0.4  0.0 - 0.7 K/uL   Basophils Relative 0  0 - 1 %   Basophils Absolute 0.0  0.0 - 0.1 K/uL  BASIC METABOLIC PANEL      Result Value Range   Sodium 140  135 - 145 mEq/L   Potassium 2.9 (*) 3.5 - 5.1 mEq/L   Chloride 102  96 - 112 mEq/L   CO2 28  19 - 32 mEq/L   Glucose, Bld 105 (*) 70 - 99 mg/dL   BUN 4 (*) 6 - 23 mg/dL   Creatinine, Ser 7.82 (*) 0.50 - 1.10 mg/dL   Calcium 8.6  8.4 - 95.6 mg/dL   GFR calc non Af Amer >90  >90 mL/min   GFR calc Af Amer >90  >90 mL/min    COORDINATION OF CARE: 11:24 PM Discussed ED treatment with pt and pt agrees.      12:45 AM:  T/C to Dr. Rito Ehrlich, hospitalist, case discussed, including:  HPI, pertinent PM/SHx, VS/PE, dx testing, ED course and treatment.  Agreeable to admission.  Requests to write temporary orders, med-surg bed.   MDM  Patient with insect bite over left eyebrow that has infected her face. Has failed PO antibiotics. Potassium is low. Given potassium while here.Given vancomycin and rocephin. Will arrange for admission.Spoke with Dr. Rito Ehrlich, hospitalist who will admit the patient. Pt stable in ED with no significant deterioration in condition.The patient appears reasonably stabilized for admission considering the current resources, flow, and  capabilities available in the ED at this time, and I doubt any other [facility] requiring further screening and/or  treatment in the ED prior to admission.   I personally performed the services described in this documentation, which was scribed in my presence. The recorded information has been reviewed and considered.  MDM Reviewed: nursing note and vitals Interpretation: labs            Nicoletta Dress. Colon Branch, MD 09/10/12 [phone]

## 2012-09-10 ENCOUNTER — Inpatient Hospital Stay (HOSPITAL_COMMUNITY): Payer: Self-pay

## 2012-09-10 ENCOUNTER — Encounter (HOSPITAL_COMMUNITY): Payer: Self-pay | Admitting: Internal Medicine

## 2012-09-10 DIAGNOSIS — F411 Generalized anxiety disorder: Secondary | ICD-10-CM

## 2012-09-10 DIAGNOSIS — E876 Hypokalemia: Secondary | ICD-10-CM | POA: Diagnosis present

## 2012-09-10 DIAGNOSIS — F419 Anxiety disorder, unspecified: Secondary | ICD-10-CM | POA: Diagnosis present

## 2012-09-10 DIAGNOSIS — Z720 Tobacco use: Secondary | ICD-10-CM | POA: Diagnosis present

## 2012-09-10 DIAGNOSIS — K869 Disease of pancreas, unspecified: Secondary | ICD-10-CM

## 2012-09-10 DIAGNOSIS — W57XXXA Bitten or stung by nonvenomous insect and other nonvenomous arthropods, initial encounter: Secondary | ICD-10-CM | POA: Diagnosis present

## 2012-09-10 DIAGNOSIS — L0201 Cutaneous abscess of face: Secondary | ICD-10-CM

## 2012-09-10 DIAGNOSIS — Z5189 Encounter for other specified aftercare: Secondary | ICD-10-CM

## 2012-09-10 DIAGNOSIS — R109 Unspecified abdominal pain: Secondary | ICD-10-CM

## 2012-09-10 DIAGNOSIS — L03211 Cellulitis of face: Secondary | ICD-10-CM | POA: Diagnosis present

## 2012-09-10 DIAGNOSIS — F329 Major depressive disorder, single episode, unspecified: Secondary | ICD-10-CM

## 2012-09-10 LAB — HEPATIC FUNCTION PANEL
ALT: 21 U/L (ref 0–35)
AST: 22 U/L (ref 0–37)
Bilirubin, Direct: <0.1 mg/dL (ref 0.0–0.3)
Total Protein: 6.5 g/dL (ref 6.0–8.3)

## 2012-09-10 LAB — BASIC METABOLIC PANEL
BUN: 3 mg/dL — ABNORMAL LOW (ref 6–23)
Chloride: 106 mEq/L (ref 96–112)
GFR calc Af Amer: >90 mL/min (ref >90)
GFR calc non Af Amer: >90 mL/min (ref >90)
Potassium: 3.8 mEq/L (ref 3.5–5.1)
Sodium: 140 mEq/L (ref 135–145)

## 2012-09-10 LAB — MAGNESIUM: Magnesium: 1.9 mg/dL (ref 1.5–2.5)

## 2012-09-10 MED ORDER — IBUPROFEN 600 MG PO TABS
600.0000 mg | ORAL_TABLET | Freq: Four times a day (QID) | ORAL | Status: DC
  Administered 2012-09-10: 600 mg via ORAL
  Filled 2012-09-10: qty 1

## 2012-09-10 MED ORDER — POTASSIUM CHLORIDE IN NACL 20-0.9 MEQ/L-% IV SOLN
INTRAVENOUS | Status: DC
  Administered 2012-09-10 – 2012-09-12 (×6): via INTRAVENOUS

## 2012-09-10 MED ORDER — ONDANSETRON HCL 4 MG PO TABS
4.0000 mg | ORAL_TABLET | Freq: Four times a day (QID) | ORAL | Status: DC | PRN
  Administered 2012-09-11 – 2012-09-12 (×3): 4 mg via ORAL
  Filled 2012-09-10 (×3): qty 1

## 2012-09-10 MED ORDER — BIOTENE DRY MOUTH MT LIQD
15.0000 mL | Freq: Two times a day (BID) | OROMUCOSAL | Status: DC
  Administered 2012-09-10 – 2012-09-12 (×5): 15 mL via OROMUCOSAL

## 2012-09-10 MED ORDER — BOOST / RESOURCE BREEZE PO LIQD
1.0000 | Freq: Two times a day (BID) | ORAL | Status: DC
  Administered 2012-09-10 – 2012-09-12 (×4): 1 via ORAL

## 2012-09-10 MED ORDER — IOHEXOL 300 MG/ML  SOLN
100.0000 mL | Freq: Once | INTRAMUSCULAR | Status: AC | PRN
  Administered 2012-09-10: 100 mL via INTRAVENOUS

## 2012-09-10 MED ORDER — DEXTROSE 5 % IV SOLN
1.0000 g | INTRAVENOUS | Status: DC
  Administered 2012-09-11 – 2012-09-12 (×2): 1 g via INTRAVENOUS
  Filled 2012-09-10 (×3): qty 10

## 2012-09-10 MED ORDER — HYDROMORPHONE HCL PF 1 MG/ML IJ SOLN
1.0000 mg | INTRAMUSCULAR | Status: DC | PRN
  Administered 2012-09-10 – 2012-09-12 (×5): 1 mg via INTRAVENOUS
  Filled 2012-09-10 (×6): qty 1

## 2012-09-10 MED ORDER — ONDANSETRON HCL 4 MG/2ML IJ SOLN: 4.0000 mg | Freq: Four times a day (QID) | INTRAMUSCULAR | Status: DC | PRN

## 2012-09-10 MED ORDER — ACETAMINOPHEN 650 MG RE SUPP: 650.0000 mg | Freq: Four times a day (QID) | RECTAL | Status: DC | PRN

## 2012-09-10 MED ORDER — PANTOPRAZOLE SODIUM 40 MG IV SOLR
40.0000 mg | INTRAVENOUS | Status: DC
  Administered 2012-09-10: 40 mg via INTRAVENOUS
  Filled 2012-09-10: qty 40

## 2012-09-10 MED ORDER — OXYCODONE HCL 5 MG PO TABS
5.0000 mg | ORAL_TABLET | ORAL | Status: DC | PRN
  Administered 2012-09-10 – 2012-09-11 (×4): 5 mg via ORAL
  Filled 2012-09-10 (×5): qty 1

## 2012-09-10 MED ORDER — LORAZEPAM 1 MG PO TABS
1.0000 mg | ORAL_TABLET | Freq: Three times a day (TID) | ORAL | Status: DC
  Administered 2012-09-10 – 2012-09-12 (×7): 1 mg via ORAL
  Filled 2012-09-10 (×7): qty 1

## 2012-09-10 MED ORDER — POTASSIUM CHLORIDE CRYS ER 20 MEQ PO TBCR
60.0000 meq | EXTENDED_RELEASE_TABLET | Freq: Once | ORAL | Status: AC
  Administered 2012-09-10: 60 meq via ORAL
  Filled 2012-09-10: qty 3

## 2012-09-10 MED ORDER — PANTOPRAZOLE SODIUM 40 MG PO TBEC
40.0000 mg | DELAYED_RELEASE_TABLET | Freq: Two times a day (BID) | ORAL | Status: DC
  Administered 2012-09-10 – 2012-09-12 (×4): 40 mg via ORAL
  Filled 2012-09-10 (×4): qty 1

## 2012-09-10 MED ORDER — DIPHENHYDRAMINE HCL 50 MG/ML IJ SOLN
12.5000 mg | Freq: Once | INTRAMUSCULAR | Status: AC
  Administered 2012-09-10: 12.5 mg via INTRAVENOUS
  Filled 2012-09-10: qty 1

## 2012-09-10 MED ORDER — CHLORHEXIDINE GLUCONATE 0.12 % MT SOLN
15.0000 mL | Freq: Two times a day (BID) | OROMUCOSAL | Status: DC
  Administered 2012-09-10 – 2012-09-12 (×5): 15 mL via OROMUCOSAL
  Filled 2012-09-10 (×5): qty 15

## 2012-09-10 MED ORDER — VANCOMYCIN HCL 500 MG IV SOLR
500.0000 mg | Freq: Two times a day (BID) | INTRAVENOUS | Status: DC
  Administered 2012-09-10 – 2012-09-12 (×5): 500 mg via INTRAVENOUS
  Filled 2012-09-10 (×5): qty 500

## 2012-09-10 MED ORDER — POLYVINYL ALCOHOL 1.4 % OP SOLN
1.0000 [drp] | OPHTHALMIC | Status: DC | PRN
  Administered 2012-09-10: 1 [drp] via OPHTHALMIC
  Filled 2012-09-10: qty 15

## 2012-09-10 MED ORDER — LORAZEPAM 1 MG PO TABS
1.0000 mg | ORAL_TABLET | Freq: Once | ORAL | Status: AC
  Administered 2012-09-10: 1 mg via ORAL
  Filled 2012-09-10: qty 1

## 2012-09-10 MED ORDER — HYDROMORPHONE HCL PF 1 MG/ML IJ SOLN
1.0000 mg | Freq: Once | INTRAMUSCULAR | Status: AC
  Administered 2012-09-10: 1 mg via INTRAVENOUS
  Filled 2012-09-10: qty 1

## 2012-09-10 MED ORDER — ACETAMINOPHEN 325 MG PO TABS: 650.0000 mg | ORAL_TABLET | Freq: Four times a day (QID) | ORAL | Status: DC | PRN

## 2012-09-10 MED ORDER — INFLUENZA VIRUS VACC SPLIT PF IM SUSP
0.5000 mL | INTRAMUSCULAR | Status: AC
  Administered 2012-09-11: 0.5 mL via INTRAMUSCULAR
  Filled 2012-09-10: qty 0.5

## 2012-09-10 MED ORDER — ALBUTEROL SULFATE (5 MG/ML) 0.5% IN NEBU: 2.5000 mg | INHALATION_SOLUTION | RESPIRATORY_TRACT | Status: DC | PRN

## 2012-09-10 MED ORDER — DIPHENHYDRAMINE HCL 50 MG/ML IJ SOLN
25.0000 mg | Freq: Four times a day (QID) | INTRAMUSCULAR | Status: DC | PRN
  Administered 2012-09-12: 25 mg via INTRAVENOUS
  Filled 2012-09-10: qty 1

## 2012-09-10 MED ORDER — IOHEXOL 300 MG/ML  SOLN
50.0000 mL | Freq: Once | INTRAMUSCULAR | Status: AC | PRN
  Administered 2012-09-10: 50 mL via ORAL

## 2012-09-10 MED ORDER — NICOTINE 14 MG/24HR TD PT24
14.0000 mg | MEDICATED_PATCH | Freq: Every day | TRANSDERMAL | Status: DC
  Administered 2012-09-10 – 2012-09-12 (×3): 14 mg via TRANSDERMAL
  Filled 2012-09-10 (×3): qty 1

## 2012-09-10 NOTE — Care Management Note (Unsigned)
    Page 1 of 1   09/12/2012     12:07:02 PM   CARE MANAGEMENT NOTE 09/12/2012  Patient:  Monique Nguyen, Monique Nguyen   Account Number:  [id number]  Date Initiated:  09/10/2012  Documentation initiated by:  Rosemary Holms  Subjective/Objective Assessment:   Pt admitted from home where she lives iwth her boyfriend and her son. Spoke to pt at bedside. No HH needs identified.     Action/Plan:   Anticipated DC Date:  09/13/2012   Anticipated DC Plan:  HOME/SELF CARE      DC Planning Services  CM consult      Choice offered to / List presented to:             Status of service:  In process, will continue to follow Medicare Important Message given?   (If response is "NO", the following Medicare IM given date fields will be blank) Date Medicare IM given:   Date Additional Medicare IM given:    Discharge Disposition:    Per UR Regulation:    If discussed at Long Length of Stay Meetings, dates discussed:    Comments:  09/12/12 Rosemary Holms RN BSN CM Plans to switch AB to PO tomorrow and anticipate DC, no needs anticipated  09/10/12 Rosemary Holms RN BSN CM

## 2012-09-10 NOTE — Progress Notes (Signed)
[facility] PROGRESS NOTE  ALLIX BLOMQUIST ZOX:[id number] DOB: 1970-12-20 DOA: 09/09/2012 PCP: Kirk Ruths, MD  Assessment/Plan: #1 facial cellulitis involving left side of the face: Probably related to the insect/spider bite. CT of the maxillofacial region diffuse soft tissue swelling and enhancement over the left side of the face and supraorbital region most consistent with cellulitis. No discrete abscess is identified. Diffuse prominence of cervical lymph nodes is likely reactive  continue vancomycin and ceftriaxone day #1. Pain control not adequate. Will add po to IV  Dilaudid as well as scheduled ibuprofen. Will provide warm compress to area. Currently not fluctuant. Max temp 99.6 oral. No white count.  Surgical consult requested.   #2 abdominal pain: continues with tenderness in the right lower quadrant. Abdominal Ct with2.2 x 2.7 cm cystic mass in the body of the pancreas with associated inflammatory stranding around the body and tail the pancreas and mild pancreatic and bile duct dilatation. 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 Small amount of free fluid in the pelvis is likely physiologic.  Alk Phos 127 otherwise LF's unremarkable.  Will advance to full liquids. Urine pregnancy test was negative 3 days ago. She did undergo a pelvic exam 3 days ago, which was unremarkable. Requested GI consult as well.   #3 history of depression and anxiety:  Currently at baseline Continue  Ativan.   #4 hypokalemia: This was repleted intravenously. She was given oral potassium in the ED.. Magnesium level within normal limits. Today's bmet pending.    #5 tobacco abuse: Nicotine patch will be prescribed . Smoking cessation counseling.   Code Status: full Family Communication: pt at bedside Disposition Plan: home when ready   Consultants:  Surgery  GI  Procedures:  none  Antibiotics:  Vancomycin 09/10/12>>>  Rocephin  09/09/12>>>  HPI/Subjective: Awake alert NAD  Objective: Filed Vitals:   09/09/12 2325 09/10/12 0439 09/10/12 0836  BP: 121/85 124/81   Pulse: 110 89 90  Temp: 99 F (37.2 C) 99.6 F (37.6 C)   TempSrc: Oral Oral   Resp: 20 20 18   Height:  5\' 1"  (1.549 m)   Weight:  48.626 kg (107 lb 3.2 oz)   SpO2: 96% 99% 9%    Intake/Output Summary (Last 24 hours) at 09/10/12 0958 Last data filed at 09/10/12 0533  Gross per 24 hour  Intake      0 ml  Output    200 ml  Net   -200 ml   Filed Weights   09/10/12 0439  Weight: 48.626 kg (107 lb 3.2 oz)    Exam:   General:  Alert calm NAD  Cardiovascular: tachycardic, No MGR No LE edema  Respiratory: normal effort BS clear, no wheeze/rhonchi bilaterally  Abdomen: flat soft +BS mild tenderness to palpation in lower quadrants. No guarding, no rebounding.   Musculoskeletal: moves all extremities. No joint swelling/erythema   HEENT: Lesion on left forehead above eyebrow with moderate swelling dark erythema with pinhead size white center. Area shiny, without drainage very tender to touch, little heat. No fluctuance. Swelling extends to left side of face without erythema in this area. EOMI, vision intact.   Data Reviewed: Basic Metabolic Panel:  Recent Labs Lab 09/09/12 2329  NA 140  K 2.9*  CL 102  CO2 28  GLUCOSE 105*  BUN 4*  CREATININE 0.49*  CALCIUM 8.6  MG 1.9   Liver Function Tests:  Recent Labs Lab 09/09/12 2329  AST 22  ALT 21  ALKPHOS 127*  BILITOT  0.2*  PROT 6.5  ALBUMIN 3.1*    Recent Labs Lab 09/10/12  LIPASE 31   No results found for this basename: AMMONIA,  in the last 168 hours CBC:  Recent Labs Lab 09/06/12 1924 09/09/12 2329  WBC 7.2 8.7  NEUTROABS 5.5 6.4  HGB 13.5 13.2  HCT 38.2 38.2  MCV 94.6 94.3  PLT 299 277   Cardiac Enzymes: No results found for this basename: CKTOTAL, CKMB, CKMBINDEX, TROPONINI,  in the last 168 hours BNP (last 3 results) No results found for this  basename: PROBNP,  in the last 8760 hours CBG: No results found for this basename: GLUCAP,  in the last 168 hours Recent Results (from the past 240 hour(s))  GC/CHLAMYDIA PROBE AMP     Status: None   Collection Time    09/06/12  7:47 PM      Result Value Range Status   CT Probe RNA NEGATIVE  NEGATIVE Final   GC Probe RNA NEGATIVE  NEGATIVE Final   Comment: (NOTE)                                                                                              Normal Reference Range: Negative          Assay performed using the Gen-Probe APTIMA COMBO2 (R) Assay.     Acceptable specimen types for this assay include APTIMA Swabs (Unisex,     endocervical, urethral, or vaginal), first void urine, and ThinPrep     liquid based cytology samples.  WET PREP, GENITAL     Status: Abnormal   Collection Time    09/06/12  7:47 PM      Result Value Range Status   Yeast Wet Prep HPF POC NONE SEEN  NONE SEEN Final   Trich, Wet Prep NONE SEEN  NONE SEEN Final   Clue Cells Wet Prep HPF POC NONE SEEN  NONE SEEN Final   WBC, Wet Prep HPF POC FEW (*) NONE SEEN Final     Studies: Ct Abdomen Pelvis W Contrast  09/10/2012  *RADIOLOGY REPORT*  Clinical Data: Right greater than left abdominal pain.  CT ABDOMEN AND PELVIS WITH CONTRAST  Technique:  Multidetector CT imaging of the abdomen and pelvis was performed following the standard protocol during bolus administration of intravenous contrast.  Contrast: 50mL OMNIPAQUE IOHEXOL 300 MG/ML  SOLN, OMNIPAQUE IOHEXOL 300 MG/ML  SOLN  Comparison: 08/01/2008  Findings: Mild dependent atelectasis in the lung bases.  There is a circumscribed cystic appearing lesion in the body of the pancreas measuring about 2.2 x 2.7 cm.  There is infiltration in the fat around the body and tail of the pancreas.  Mild pancreatic and bile duct dilatation.  Changes are most likely to represent pancreatitis with pseudocyst or wall off necrosis but a cystic mass is not excluded.  Correlation  with clinical and laboratory evidence of pancreatitis is recommended.  If no evidence of pancreatitis, then elective MRI / MRCP is recommended for further evaluation of the lesion.  The liver, spleen, gallbladder, adrenal glands, kidneys, abdominal aorta, inferior vena cava, and retroperitoneal lymph nodes are  unremarkable.  No free air or free fluid in the abdomen.  The stomach, small bowel, and colon are not abnormally distended.  Pelvis:  The uterus and ovaries are not enlarged.  Small amount of free fluid in the pelvis is likely to be physiologic.  The bladder wall is not thickened.  The appendix is not visualized.  No evidence of diverticulitis.  No significant pelvic lymphadenopathy. Normal alignment of the lumbar vertebrae.  IMPRESSION: 2.2 x 2.7 cm cystic mass in the body of the pancreas with associated inflammatory stranding around the body and tail the pancreas and mild pancreatic and bile duct dilatation.  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  Correlation with clinical and laboratory evidence of pancreatitis is recommended.  Small amount of free fluid in the pelvis is likely physiologic.   Original Report Authenticated By: Burman Nieves, M.D.    Ct Maxillofacial W/cm  09/10/2012  *RADIOLOGY REPORT*  Clinical Data: Left facial swelling 1 week after spider bite.  CT MAXILLOFACIAL WITH CONTRAST  Technique:  Multidetector CT imaging of the maxillofacial structures was performed with intravenous contrast. Multiplanar CT image reconstructions were also generated.  Contrast:  50 ml Omnipaque-300  Comparison: None.  Findings: There is diffuse soft tissue swelling over the left supraorbital, periorbital, and anterior maxillary region.  There is contrast opacification of the edematous soft tissues.  This is consistent with inflammatory process such as cellulitis.  There is no loculated fluid collection to suggest an abscess.  The globes and extraocular muscles appear intact and  symmetrical.  No preseptal or retrobulbar involvement.  The paranasal sinuses are clear.  Visualized bones appear intact.  No displaced orbital or facial fractures are identified.  There are prominent lymph nodes throughout the cervical regions bilaterally and in the angle of the mandible and submental spaces.  These are not pathologically enlarged and likely reactive.  IMPRESSION: Diffuse soft tissue swelling and enhancement over the left side of the face and supraorbital region most consistent with cellulitis. No discrete abscess is identified.  Diffuse prominence of cervical lymph nodes is likely reactive.   Original Report Authenticated By: Burman Nieves, M.D.     Scheduled Meds: . antiseptic oral rinse  15 mL Mouth Rinse q12n4p  . [START ON 09/11/2012] cefTRIAXone (ROCEPHIN)  IV  1 g Intravenous Q24H  . chlorhexidine  15 mL Mouth Rinse BID  . [START ON 09/11/2012] influenza  inactive virus vaccine  0.5 mL Intramuscular Tomorrow-1000  . LORazepam  1 mg Oral TID  . nicotine  14 mg Transdermal Daily  . pantoprazole (PROTONIX) IV  40 mg Intravenous Q24H  . vancomycin  500 mg Intravenous Q12H   Continuous Infusions: . 0.9 % NaCl with KCl 20 mEq / L 100 mL/hr at 09/10/12 7829    Principal Problem:   Facial cellulitis Active Problems:   Abdominal pain   Insect bite   Depression   Anxiety   Hypokalemia   Tobacco abuse    Time spent: 35 minutes    [facility] M  [facility]  If 7PM-7AM, please contact night-coverage at [email or web address], password [facility] 09/10/2012, 9:58 AM  LOS: 1 day   Attending note:  Patient admitted earlier today by Dr. Rito Ehrlich. Patient independently seen and examined. Continue to treat cellulitis with IV antibiotics. She will need further GI workup regarding cystic pancreatic mass once her infection is cleared up.

## 2012-09-10 NOTE — ED Notes (Signed)
Pt in CT at this time.

## 2012-09-10 NOTE — H&P (Addendum)
[facility] History and Physical  VERTA RIEDLINGER WGN:[id number] DOB: 1970-08-06 DOA: 09/09/2012   PCP: Kirk Ruths, MD   Chief Complaint: Increasing pain and swelling in the left face  HPI: SHELSEY RIETH is a 42 y.o. female with a past medical history of depression and anxiety, who was in her usual state of health till about more than a week ago, when she felt an insect bite on the left forehead. She was inside the house when this happened and thinks that it was most likely a spider. Patient also has been having nausea, vomiting, for about a week, along with lower abdominal pain. She had some spotting from her vagina a few days ago. This prompted a visit to the emergency department on Thursday. She underwent a pelvic examination. She was prescribed Benadryl for the insect bite. She then presented back on Saturday as the swelling in the face was getting worse. She was examined and was prescribed Bactrim. Patient's symptoms of nausea, vomiting, have persisted. The pain is located in the lower abdomen and is about 6/10 in intensity. Denies any vaginal discharge currently. Denies any diarrhea. She has vomited a few times today consisting of clear liquids but mostly it has been retching and dry heaves. The pain in the left side of the face is a throbbing pain. 10 out of 10 in intensity. She was given morphine, which has not really helped. She's had chills at home and thinks that she had a fever, however, did not check a temperature. She said she hasn't had a menstrual period in quite a while, but she had some vaginal bleeding in January. The left side of the face does itch quite a bit. She denies any difficulty swallowing. She does have some visual blurring, but is able to see through the left eye. Does have a headache, which mostly is located in that lesion in the left forehead.  Home Medications: Prior to Admission medications   Medication Sig Start Date End Date Taking? Authorizing  Provider  acetaminophen (TYLENOL) 500 MG tablet Take 500 mg by mouth daily as needed for pain.    Historical Provider, MD  doxylamine, Sleep, (SLEEP AID) 25 MG tablet Take 25 mg by mouth at bedtime as needed for sleep.    Historical Provider, MD  LORazepam (ATIVAN) 1 MG tablet Take 1 mg by mouth 3 (three) times daily.    Historical Provider, MD  promethazine (PHENERGAN) 25 MG tablet Take 0.5 tablets (12.5 mg total) by mouth every 6 (six) hours as needed for nausea. 09/06/12   Hope Orlene Och, NP    Allergies:  Allergies  Allergen Reactions  . Doxycycline     headaches  . Sulfa Antibiotics Nausea And Vomiting and Other (See Comments)    Blurred vision and headaches    Past Medical History: Past Medical History  Diagnosis Date  . GERD (gastroesophageal reflux disease)   . Bacterial vaginosis   . Anxiety   . Depression   . Panic attacks   . Psoriasis     Past Surgical History  Procedure Laterality Date  . Bladder surgery      X 3    Social History:  reports that she has been smoking Cigarettes.  She has been smoking about 1.50 packs per day. She does not have any smokeless tobacco history on file. She reports that she drinks about 1.8 ounces of alcohol per week. She reports that she uses illicit drugs (Marijuana).  Living Situation: Lives in a [place] Activity  Level: Independent with daily activities   Family History:  Family History  Problem Relation Age of Onset  . Depression Mother     living  . Pancreatic cancer Father     living     Review of Systems - History obtained from the patient General ROS: positive for  - chills Psychological ROS: positive for - anxiety Ophthalmic ROS: negative ENT ROS: negative Allergy and Immunology ROS: negative Hematological and Lymphatic ROS: negative Endocrine ROS: negative Respiratory ROS: no cough, shortness of breath, or wheezing Cardiovascular ROS: no chest pain or dyspnea on exertion Gastrointestinal ROS: as in  hpi Genito-Urinary ROS: no dysuria, trouble voiding, or hematuria Musculoskeletal ROS: negative Neurological ROS: no TIA or stroke symptoms Dermatological ROS: negative  Physical Examination  Filed Vitals:   09/09/12 2325  BP: 121/85  Pulse: 110  Temp: 99 F (37.2 C)  TempSrc: Oral  Resp: 20  SpO2: 96%    General appearance: alert, cooperative, appears stated age and no distress Head: Left side of the face is swollen. She also has some swelling under her eyes on both sides. There is a lesion in the left forehead above the eyebrow measuring about 3 cm x 3 cm. There is yellowish discharge noted. The area is very tender. She also has facial swelling in the left face and cheek area. No obvious fluctuation is noted. Eyes: The left eye is somewhat closed because of the swelling from the inflammation and infection. However, movements are normal. Reflexes are normal Throat: lips, mucosa, and tongue normal; teeth and gums normal Neck: Slightly enlarged left cervical and submandibular lymph nodes. Resp: clear to auscultation bilaterally Cardio: regular rate and rhythm, S1, S2 normal, no murmur, click, rub or gallop GI: Abdomen is soft. There is tenderness in the lower quadrants bilaterally, but right more than the left. There is no rigidity, or guarding but minimal rebound was present. No masses, or organomegaly. There is also tenderness in the suprapubic area. Rest of the abdomen is quite benign. Bowel sounds are present. Extremities: extremities normal, atraumatic, no cyanosis or edema Pulses: 2+ and symmetric Skin: Skin color, texture, turgor normal. No rashes or lesions Lymph nodes: Cervical, supraclavicular, and axillary nodes normal. Neurologic: She is alert and oriented x3. No focal neurological deficits are present.  Laboratory Data: Results for orders placed during the hospital encounter of 09/09/12 (from the past 48 hour(s))  CBC WITH DIFFERENTIAL     Status: None   Collection  Time    09/09/12 11:29 PM      Result Value Range   WBC 8.7  4.0 - 10.5 K/uL   RBC 4.05  3.87 - 5.11 MIL/uL   Hemoglobin 13.2  12.0 - 15.0 g/dL   HCT 16.1  09.6 - 04.5 %   MCV 94.3  78.0 - 100.0 fL   MCH 32.6  26.0 - 34.0 pg   MCHC 34.6  30.0 - 36.0 g/dL   RDW 40.9  81.1 - 91.4 %   Platelets 277  150 - 400 K/uL   Neutrophils Relative 74  43 - 77 %   Neutro Abs 6.4  1.7 - 7.7 K/uL   Lymphocytes Relative 14  12 - 46 %   Lymphs Abs 1.3  0.7 - 4.0 K/uL   Monocytes Relative 8  3 - 12 %   Monocytes Absolute 0.7  0.1 - 1.0 K/uL   Eosinophils Relative 4  0 - 5 %   Eosinophils Absolute 0.4  0.0 - 0.7 K/uL  Basophils Relative 0  0 - 1 %   Basophils Absolute 0.0  0.0 - 0.1 K/uL  BASIC METABOLIC PANEL     Status: Abnormal   Collection Time    09/09/12 11:29 PM      Result Value Range   Sodium 140  135 - 145 mEq/L   Potassium 2.9 (*) 3.5 - 5.1 mEq/L   Chloride 102  96 - 112 mEq/L   CO2 28  19 - 32 mEq/L   Glucose, Bld 105 (*) 70 - 99 mg/dL   BUN 4 (*) 6 - 23 mg/dL   Creatinine, Ser 4.09 (*) 0.50 - 1.10 mg/dL   Calcium 8.6  8.4 - 81.1 mg/dL   GFR calc non Af Amer >90  >90 mL/min   GFR calc Af Amer >90  >90 mL/min   Comment:            The eGFR has been calculated     using the CKD EPI equation.     This calculation has not been     validated in all clinical     situations.     eGFR's persistently     <90 mL/min signify     possible Chronic Kidney Disease.    Radiology Reports: No results found.  Problem List  Principal Problem:   Facial cellulitis Active Problems:   Abdominal pain   Insect bite   Depression   Anxiety   Hypokalemia   Tobacco abuse   Assessment: This is a 42 year old, Caucasian female, with a past medical history of depression and anxiety, who presents with insect bite. She has evidence for facial cellulitis. She has swelling in the left side of the face. Looks like she has a localized abscess in the left forehead. The rest of the facial swelling is  probably an allergic reaction. She also has nausea, vomiting, and lower abdominal pain, etiology of which is not clear.  Plan: #1 facial cellulitis involving left side of the face: Probably related to the insect/spider bite. We will obtain a CT of the maxillofacial region to make sure there is no extension of the abscess internally. She will placed on vancomycin and ceftriaxone for now. Pain control will be provided.  #2 abdominal pain: She was quite tender in the right lower quadrant. However, also had suprapubic tenderness. She hasn't had any imaging studies recently. Since she does have ongoing symptoms we will proceed with a CT scan of the abdomen and pelvis. UA was done, not significant for infection. Will check LFTs. We will keep her on clear liquids for now. Urine pregnancy test was negative 2 days ago. She did undergo a pelvic exam 2 days ago, which was unremarkable.  #3 history of depression and anxiety: Continue with her Ativan.  #4 hypokalemia: This will be repleted intravenously. She was given oral potassium in the ED.. Magnesium level will be checked.  #5 tobacco abuse: Nicotine patch will be prescribed  DVT Prophylaxis: SCDs for now in case she needs to have I&D. Code Status: Full code Family Communication: Discussed with the patient  Disposition Plan: Likely return home in improved   Further management decisions will depend on results of further testing and patient's response to treatment.  [facility]  [facility] Pager [phone]  If 7PM-7AM, please contact night-coverage [email or web address] Password [facility]  09/10/2012, 1:29 AM   CT Abdomen and Ct Maxillofacial reports reviewed. Will check Lipase. Findings are surprising considering her presentation. Other plan as above. Obtain GI input as well due  to presence of pancreatic lesion. Patient may need MRCP.  Kyri Shader 4:10 AM

## 2012-09-10 NOTE — Clinical Social Work Psychosocial (Signed)
    Clinical Social Work Department BRIEF PSYCHOSOCIAL ASSESSMENT 09/10/2012  Patient:  Monique Nguyen, Monique Nguyen     Account Number:  [id number]     Admit date:  09/09/2012  Clinical Social Worker:  Santa Genera, CLINICAL SOCIAL WORKER  Date/Time:  09/10/2012 11:30 AM  Referred by:  Physician  Date Referred:  09/10/2012 Referred for  Transportation assistance   Other Referral:   Interview type:  Patient Other interview type:    PSYCHOSOCIAL DATA Living Status:  FAMILY Admitted from facility:   Level of care:   Primary support name:  Andrey Farmer Primary support relationship to patient:  FRIEND Degree of support available:   Adequate    CURRENT CONCERNS Current Concerns  Other - See comment   Other Concerns:   Needs help w transportation    SOCIAL WORK ASSESSMENT / PLAN CSW met w patient at bedside, patient alert and oriented. Lives w boyfriend an 88 year old autistic son in boyfriends' house in [place].  Patient states that she has lost her license due to a DUI last year and returned her vehicle to the car loan agency.  Boyfriend works 10 hour days, patient stays at home w autistic son. Son sleeps most of the day and "plays w trains and video games" at night in home.  Patient states that she has no help caring for son.    Patient says she has been depressed since death of her husband.  Husband died several years ago at home from cancer, patient provided round the clock care during his illness.  Patient had little caregiving support, hospice was not involved and patient has not had any grief counseling since his death.    Patient is patient of [facility] but says that she cannot make appointments or groups due to lack of transportation and lack of care for autistic son.  Says she is supposed to go to groups several times/week.  Patient was also seeing psychologist as outpatient but stopped because she felt issues were too painful to discuss.    CSW gave patient referral to  [facility] for transport help, patient says she can afford $3/ride.  CSW also mentioned resources for grief counseling including hospice and [facility].  Patient will have difficulty w transport and child care for any of these referrals, however.  CSW encouraged patient to consider options for help w care for son, including [facility]. Patient states that her depression has made taking care of life tasks difficult. Patient has court date for DWI issue and now also has failure to appear charge as a result of not making court date.  Patient has all her belongings at her old house, where she lived w her husband, has not moved fully into her boyfriends home due to depressive sx.    Patient encouraged to reconnect w [facility] and outpatient therapy.  CSW will return tomorrow to check on patient willingness to access resources given.   Assessment/plan status:  Psychosocial Support/Ongoing Assessment of Needs Other assessment/ plan:   Information/referral to community resources:   [facility].    PATIENT'S/FAMILY'S RESPONSE TO PLAN OF CARE: Patient appreciative and willing to consider referrals.    Santa Genera, LCSW Clinical Social Worker [phone])

## 2012-09-10 NOTE — Progress Notes (Signed)
Brief initial visit with patient for support.  She stated she didn't feel well enough at this time to talk.  Plan to check with her later.

## 2012-09-10 NOTE — Consult Note (Signed)
Referring Provider: Dr. Rito Ehrlich Primary Care Physician:  Kirk Ruths, MD Primary Gastroenterologist:  Dr. Darrick Penna   Date of Admission: 09/10/12 Date of Consultation: 09/10/12  Reason for Consultation:  Pancreatic mass  HPI:  Ms. Motz is a 42 year old female last seen by our office in May 2012 in consultation secondary to diarrhea, abdominal pain, and nausea. She has a history of IBS and chronic abdominal pain. No prior colonoscopy or upper endoscopy to my knowledge. She was admitted secondary to facial cellulitis after some type of bite from possibly a spider. We were consulted secondary to findings of a cystic mass in the body of the pancreas.   Prior to admission, noted N/V about a week, lower abdominal discomfort for about 1-2 weeks. When asked to point exact location, pain is suprapubic and bilateral groins. States she had about 2-3 loose stools for several days. Last BM Saturday. Notes history of IBS. No bright red blood in stool. No melena. States she had some right sided abdominal pain, unable to tell me exactly where, when pushing on it. Thinks she had fever/chills at home. Occasional vaginal spotting. Doesn't have regular menstrual cycle. Saw a gynecologist recently, [facility] in [place].   N/V improved since admission, tolerated breakfast. No further diarrhea. No prior colonoscopy. Denies any history of pancreatitis "I don't even know what that is". States she used to drink ETOH, hx of ETOH abuse. Last drink in Jan 2014. States occasionally she feels like food gets hung up, but moreso now since she has facial cellulitis. No reflux or heartburn. +loss of appetite for about 8 months. +wt loss of about 10 lbs per patient. Pt weighed 99 lbs in our office May 2012. +early satiety. A few bites will make her full.   Past Medical History  Diagnosis Date  . GERD (gastroesophageal reflux disease)   . Bacterial vaginosis   . Anxiety   . Depression   . Panic attacks   . Psoriasis      Past Surgical History  Procedure Laterality Date  . Bladder surgery      X 3    Prior to Admission medications   Medication Sig Start Date End Date Taking? Authorizing Provider  acetaminophen (TYLENOL) 325 MG tablet Take 650 mg by mouth every 6 (six) hours as needed for pain.   Yes Historical Provider, MD  LORazepam (ATIVAN) 1 MG tablet Take 1 mg by mouth 3 (three) times daily.   Yes Historical Provider, MD    Current Facility-Administered Medications  Medication Dose Route Frequency Provider Last Rate Last Dose  . 0.9 % NaCl with KCl 20 mEq/ L  infusion   Intravenous Continuous Osvaldo Shipper, MD 100 mL/hr at 09/10/12 0506    . acetaminophen (TYLENOL) tablet 650 mg  650 mg Oral Q6H PRN Osvaldo Shipper, MD       Or  . acetaminophen (TYLENOL) suppository 650 mg  650 mg Rectal Q6H PRN Osvaldo Shipper, MD      . albuterol (PROVENTIL) (5 MG/ML) 0.5% nebulizer solution 2.5 mg  2.5 mg Nebulization Q2H PRN Osvaldo Shipper, MD      . antiseptic oral rinse (BIOTENE) solution 15 mL  15 mL Mouth Rinse q12n4p Osvaldo Shipper, MD      . Melene Muller ON 09/11/2012] cefTRIAXone (ROCEPHIN) 1 g in dextrose 5 % 50 mL IVPB  1 g Intravenous Q24H Osvaldo Shipper, MD      . chlorhexidine (PERIDEX) 0.12 % solution 15 mL  15 mL Mouth Rinse BID Osvaldo Shipper, MD      .  diphenhydrAMINE (BENADRYL) injection 25 mg  25 mg Intravenous Q6H PRN Osvaldo Shipper, MD      . HYDROmorphone (DILAUDID) injection 1 mg  1 mg Intravenous Q3H PRN Osvaldo Shipper, MD   1 mg at 09/10/12 0533  . [START ON 09/11/2012] influenza  inactive virus vaccine (FLUZONE/FLUARIX) injection 0.5 mL  0.5 mL Intramuscular Tomorrow-1000 Osvaldo Shipper, MD      . LORazepam (ATIVAN) tablet 1 mg  1 mg Oral TID Osvaldo Shipper, MD      . nicotine (NICODERM CQ - dosed in mg/24 hours) patch 14 mg  14 mg Transdermal Daily Osvaldo Shipper, MD      . ondansetron (ZOFRAN) tablet 4 mg  4 mg Oral Q6H PRN Osvaldo Shipper, MD       Or  . ondansetron (ZOFRAN) injection 4 mg  4  mg Intravenous Q6H PRN Osvaldo Shipper, MD      . pantoprazole (PROTONIX) injection 40 mg  40 mg Intravenous Q24H Osvaldo Shipper, MD   40 mg at 09/10/12 0506  . vancomycin (VANCOCIN) 500 mg in sodium chloride 0.9 % 100 mL IVPB  500 mg Intravenous Q12H Erick Blinks, MD        Allergies as of 09/09/2012 - Review Complete 09/09/2012  Allergen Reaction Noted  . Doxycycline  07/07/2012  . Sulfa antibiotics Nausea And Vomiting and Other (See Comments) 11/10/2010    Family History  Problem Relation Age of Onset  . Depression Mother     living  . Pancreatic cancer Father     living  . Colon cancer Neg Hx     History   Social History  . Marital Status: Widowed    Spouse Name: N/A    Number of Children: N/A  . Years of Education: N/A   Occupational History  . Not on file.   Social History Main Topics  . Smoking status: Current Every Day Smoker -- 1.50 packs/day    Types: Cigarettes  . Smokeless tobacco: Never Used  . Alcohol Use: 1.8 oz/week    3 Cans of beer per week     Comment: states she used to drink 12-15 cans of beer per day, stopped beer in Nov 2013, then started wine, last ETOH at all was in Jan 2014  . Drug Use: Yes    Special: Marijuana     Comment: occ  . Sexually Active: Yes    Birth Control/ Protection: None   Other Topics Concern  . Not on file   Social History Narrative  . No narrative on file    Review of Systems: Gen: SEE HPI CV: Denies chest pain, heart palpitations, syncope, edema  Resp: Denies shortness of breath with rest, cough, wheezing GI: SEE HPI GU : Denies urinary burning, urinary frequency, urinary incontinence.  MS: Denies joint pain,swelling, cramping Derm: facial cellulitis Psych: +depression/anxiety .  Physical Exam: Vital signs in last 24 hours: Temp:  [99 F (37.2 C)-99.6 F (37.6 C)] 99.6 F (37.6 C) (03/31 0439) Pulse Rate:  [89-110] 90 (03/31 0836) Resp:  [18-20] 18 (03/31 0836) BP: (121-124)/(81-85) 124/81 mmHg (03/31  0439) SpO2:  [9 %-99 %] 9 % (03/31 0836) Weight:  [107 lb 3.2 oz (48.626 kg)] 107 lb 3.2 oz (48.626 kg) (03/31 0439) Last BM Date: 09/09/12 General:   Alert, thin but not cachectic, obvious facial cellulitis  Head:  Normocephalic and atraumatic. Eyes:  Sclera clear, no icterus.    Ears:  Normal auditory acuity. Nose:  No deformity, discharge,  or lesions.  Mouth:  No deformity or lesions, dentition normal. Neck:  Supple; mild cervical adenopathy Lungs:  Clear throughout to auscultation.   No wheezes, crackles, or rhonchi. No acute distress. Heart:  Regular rate and rhythm; no murmurs, clicks, rubs,  or gallops. Abdomen:  Soft, TTP RUQ and suprapubic abdomen,  and nondistended. No masses, hepatosplenomegaly or hernias noted. Normal bowel sounds, without guarding, and without rebound.   Rectal:  Deferred  Msk:  Symmetrical without gross deformities. Normal posture. Extremities:  Without clubbing or edema. Neurologic:  Alert and  oriented x4;  grossly normal neurologically. Skin:  Left forehead lesion with serous discharge, raised, periorbital swelling Psych:  Alert and cooperative. Normal mood and affect.  Intake/Output from previous day: 03/30 0701 - 03/31 0700 In: -  Out: 200 [Urine:200] Intake/Output this shift:    Lab Results:  Recent Labs  09/09/12 2329  WBC 8.7  HGB 13.2  HCT 38.2  PLT 277   BMET  Recent Labs  09/09/12 2329  NA 140  K 2.9*  CL 102  CO2 28  GLUCOSE 105*  BUN 4*  CREATININE 0.49*  CALCIUM 8.6   LFT  Recent Labs  09/09/12 2329  PROT 6.5  ALBUMIN 3.1*  AST 22  ALT 21  ALKPHOS 127*  BILITOT 0.2*  BILIDIR <0.1  IBILI NOT CALCULATED    Studies/Results: Ct Abdomen Pelvis W Contrast  09/10/2012  *RADIOLOGY REPORT*  Clinical Data: Right greater than left abdominal pain.  CT ABDOMEN AND PELVIS WITH CONTRAST  Technique:  Multidetector CT imaging of the abdomen and pelvis was performed following the standard protocol during bolus  administration of intravenous contrast.  Contrast: 50mL OMNIPAQUE IOHEXOL 300 MG/ML  SOLN, OMNIPAQUE IOHEXOL 300 MG/ML  SOLN  Comparison: 08/01/2008  Findings: Mild dependent atelectasis in the lung bases.  There is a circumscribed cystic appearing lesion in the body of the pancreas measuring about 2.2 x 2.7 cm.  There is infiltration in the fat around the body and tail of the pancreas.  Mild pancreatic and bile duct dilatation.  Changes are most likely to represent pancreatitis with pseudocyst or wall off necrosis but a cystic mass is not excluded.  Correlation with clinical and laboratory evidence of pancreatitis is recommended.  If no evidence of pancreatitis, then elective MRI / MRCP is recommended for further evaluation of the lesion.  The liver, spleen, gallbladder, adrenal glands, kidneys, abdominal aorta, inferior vena cava, and retroperitoneal lymph nodes are unremarkable.  No free air or free fluid in the abdomen.  The stomach, small bowel, and colon are not abnormally distended.  Pelvis:  The uterus and ovaries are not enlarged.  Small amount of free fluid in the pelvis is likely to be physiologic.  The bladder wall is not thickened.  The appendix is not visualized.  No evidence of diverticulitis.  No significant pelvic lymphadenopathy. Normal alignment of the lumbar vertebrae.  IMPRESSION: 2.2 x 2.7 cm cystic mass in the body of the pancreas with associated inflammatory stranding around the body and tail the pancreas and mild pancreatic and bile duct dilatation.  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  Correlation with clinical and laboratory evidence of pancreatitis is recommended.  Small amount of free fluid in the pelvis is likely physiologic.   Original Report Authenticated By: Burman Nieves, M.D.    Ct Maxillofacial W/cm  09/10/2012  *RADIOLOGY REPORT*  Clinical Data: Left facial swelling 1 week after spider bite.  CT MAXILLOFACIAL WITH CONTRAST  Technique:  Multidetector CT imaging of the maxillofacial structures was performed with intravenous contrast. Multiplanar CT image reconstructions were also generated.  Contrast:  50 ml Omnipaque-300  Comparison: None.  Findings: There is diffuse soft tissue swelling over the left supraorbital, periorbital, and anterior maxillary region.  There is contrast opacification of the edematous soft tissues.  This is consistent with inflammatory process such as cellulitis.  There is no loculated fluid collection to suggest an abscess.  The globes and extraocular muscles appear intact and symmetrical.  No preseptal or retrobulbar involvement.  The paranasal sinuses are clear.  Visualized bones appear intact.  No displaced orbital or facial fractures are identified.  There are prominent lymph nodes throughout the cervical regions bilaterally and in the angle of the mandible and submental spaces.  These are not pathologically enlarged and likely reactive.  IMPRESSION: Diffuse soft tissue swelling and enhancement over the left side of the face and supraorbital region most consistent with cellulitis. No discrete abscess is identified.  Diffuse prominence of cervical lymph nodes is likely reactive.   Original Report Authenticated By: Burman Nieves, M.D.     Impression: 42 year old female with history of chronic abdominal pain, IBS, admitted with facial cellulitis and noted to have a 2.2 X 2.7 cystic mass in body of the pancreas, mild pancreatic and bile duct dilation. Normal lipase, mildly isolated elevation of alk phos at 127. Patient is a poor historian, and her lower abdominal discomfort may be secondary to IBS +/- GYN issues. No further loose stools since admission, no prior colonoscopy. On exam, she is tender in the right upper quadrant, and she notes vague reports of early satiety, nausea, possible dysphagia that is worse during this acute illness. Tolerated breakfast without any issues. She is actually up 8 lbs from office visit  with [place] in May 2012. She believes her father may have had pancreatic cancer. Needs further imaging in the way pancreatic mass. Outpatient work-up of dysphagia after acute illness resolves.   Plan: Agree with Protonix daily MRCP, may need EUS in future Recheck HFP in am Supportive measures Outpatient follow-up for IBS, dysphagia after current illness resolved    LOS: 1 day    09/10/2012, 9:15 AM

## 2012-09-10 NOTE — Consult Note (Signed)
REVIEWED.  Pt SHOULD AVOID ETOH. PT WILL NEED EUS REGARDLESS. CANCEL MRCP. ADVANCE DIET AS TOLERATED. BID PPI. AVOID NSAIDS IN PT WITH NV DUE TO WORSENING POSSIBILITY OF GI UPSET AND POOR PO INTAKE.EGD/DIL/?TCS AS OP ONCE FACIAL CELLULITIS IMPROVED.

## 2012-09-10 NOTE — ED Notes (Signed)
hospitalist in room at this time 

## 2012-09-10 NOTE — Consult Note (Signed)
Reason for Consult:Facial Swelling  Referring Physician: [facility]  Monique Nguyen is an 42 y.o. female.  HPI: Patient presented with several days of increasing pain and swelling in her face.  Awoke one morning with Left brow pain and small area of swelling. This increased over the last several days.  Small amount of yellow/white discharge.  No similar issues in the past.  No trauma history.  One dose of abx prior to admission.  Pain currently controlled.  Some chills but no fever.    Past Medical History  Diagnosis Date  . GERD (gastroesophageal reflux disease)   . Bacterial vaginosis   . Anxiety   . Depression   . Panic attacks   . Psoriasis     Past Surgical History  Procedure Laterality Date  . Bladder surgery      X 3    Family History  Problem Relation Age of Onset  . Depression Mother     living  . Pancreatic cancer Father     living  . Colon cancer Neg Hx     Social History:  reports that she has been smoking Cigarettes.  She has been smoking about 1.50 packs per day. She has never used smokeless tobacco. She reports that she drinks about 1.8 ounces of alcohol per week. She reports that she uses illicit drugs (Marijuana).  Allergies:  Allergies  Allergen Reactions  . Doxycycline Other (See Comments)    headaches  . Sulfa Antibiotics Nausea And Vomiting and Other (See Comments)    Blurred vision and headaches    Medications:  I have reviewed the patient's current medications. Prior to Admission:  Prescriptions prior to admission  Medication Sig Dispense Refill  . acetaminophen (TYLENOL) 325 MG tablet Take 650 mg by mouth every 6 (six) hours as needed for pain.      Marland Kitchen LORazepam (ATIVAN) 1 MG tablet Take 1 mg by mouth 3 (three) times daily.       Scheduled: . antiseptic oral rinse  15 mL Mouth Rinse q12n4p  . [START ON 09/11/2012] cefTRIAXone (ROCEPHIN)  IV  1 g Intravenous Q24H  . chlorhexidine  15 mL Mouth Rinse BID  . feeding supplement  1  Container Oral BID BM  . [START ON 09/11/2012] influenza  inactive virus vaccine  0.5 mL Intramuscular Tomorrow-1000  . LORazepam  1 mg Oral TID  . nicotine  14 mg Transdermal Daily  . pantoprazole  40 mg Oral BID AC  . vancomycin  500 mg Intravenous Q12H   Continuous: . 0.9 % NaCl with KCl 20 mEq / L 100 mL/hr at 09/10/12 1527   ZOX:[id number], acetaminophen, albuterol, diphenhydrAMINE, HYDROmorphone (DILAUDID) injection, ondansetron (ZOFRAN) IV, ondansetron, oxyCODONE, polyvinyl alcohol  Results for orders placed during the hospital encounter of 09/09/12 (from the past 48 hour(s))  CBC WITH DIFFERENTIAL     Status: None   Collection Time    09/09/12 11:29 PM      Result Value Range   WBC 8.7  4.0 - 10.5 K/uL   RBC 4.05  3.87 - 5.11 MIL/uL   Hemoglobin 13.2  12.0 - 15.0 g/dL   HCT 14.7  82.9 - 56.2 %   MCV 94.3  78.0 - 100.0 fL   MCH 32.6  26.0 - 34.0 pg   MCHC 34.6  30.0 - 36.0 g/dL   RDW 13.0  86.5 - 78.4 %   Platelets 277  150 - 400 K/uL   Neutrophils Relative 74  43 - 77 %  Neutro Abs 6.4  1.7 - 7.7 K/uL   Lymphocytes Relative 14  12 - 46 %   Lymphs Abs 1.3  0.7 - 4.0 K/uL   Monocytes Relative 8  3 - 12 %   Monocytes Absolute 0.7  0.1 - 1.0 K/uL   Eosinophils Relative 4  0 - 5 %   Eosinophils Absolute 0.4  0.0 - 0.7 K/uL   Basophils Relative 0  0 - 1 %   Basophils Absolute 0.0  0.0 - 0.1 K/uL  BASIC METABOLIC PANEL     Status: Abnormal   Collection Time    09/09/12 11:29 PM      Result Value Range   Sodium 140  135 - 145 mEq/L   Potassium 2.9 (*) 3.5 - 5.1 mEq/L   Chloride 102  96 - 112 mEq/L   CO2 28  19 - 32 mEq/L   Glucose, Bld 105 (*) 70 - 99 mg/dL   BUN 4 (*) 6 - 23 mg/dL   Creatinine, Ser 4.09 (*) 0.50 - 1.10 mg/dL   Calcium 8.6  8.4 - 81.1 mg/dL   GFR calc non Af Amer >90  >90 mL/min   GFR calc Af Amer >90  >90 mL/min   Comment:            The eGFR has been calculated     using the CKD EPI equation.     This calculation has not been     validated in  all clinical     situations.     eGFR's persistently     <90 mL/min signify     possible Chronic Kidney Disease.  MAGNESIUM     Status: None   Collection Time    09/09/12 11:29 PM      Result Value Range   Magnesium 1.9  1.5 - 2.5 mg/dL  HEPATIC FUNCTION PANEL     Status: Abnormal   Collection Time    09/09/12 11:29 PM      Result Value Range   Total Protein 6.5  6.0 - 8.3 g/dL   Albumin 3.1 (*) 3.5 - 5.2 g/dL   AST 22  0 - 37 U/L   ALT 21  0 - 35 U/L   Alkaline Phosphatase 127 (*) 39 - 117 U/L   Total Bilirubin 0.2 (*) 0.3 - 1.2 mg/dL   Bilirubin, Direct <9.1  0.0 - 0.3 mg/dL   Indirect Bilirubin NOT CALCULATED  0.3 - 0.9 mg/dL  LIPASE, BLOOD     Status: None   Collection Time    09/10/12 12:00 AM      Result Value Range   Lipase 31  11 - 59 U/L  BASIC METABOLIC PANEL     Status: Abnormal   Collection Time    09/10/12 10:34 AM      Result Value Range   Sodium 140  135 - 145 mEq/L   Potassium 3.8  3.5 - 5.1 mEq/L   Comment: DELTA CHECK NOTED   Chloride 106  96 - 112 mEq/L   CO2 26  19 - 32 mEq/L   Glucose, Bld 103 (*) 70 - 99 mg/dL   BUN 3 (*) 6 - 23 mg/dL   Creatinine, Ser 4.78 (*) 0.50 - 1.10 mg/dL   Calcium 7.8 (*) 8.4 - 10.5 mg/dL   GFR calc non Af Amer >90  >90 mL/min   GFR calc Af Amer >90  >90 mL/min   Comment:  The eGFR has been calculated     using the CKD EPI equation.     This calculation has not been     validated in all clinical     situations.     eGFR's persistently     <90 mL/min signify     possible Chronic Kidney Disease.    Ct Abdomen Pelvis W Contrast  09/10/2012  *RADIOLOGY REPORT*  Clinical Data: Right greater than left abdominal pain.  CT ABDOMEN AND PELVIS WITH CONTRAST  Technique:  Multidetector CT imaging of the abdomen and pelvis was performed following the standard protocol during bolus administration of intravenous contrast.  Contrast: 50mL OMNIPAQUE IOHEXOL 300 MG/ML  SOLN, OMNIPAQUE IOHEXOL 300 MG/ML  SOLN   Comparison: 08/01/2008  Findings: Mild dependent atelectasis in the lung bases.  There is a circumscribed cystic appearing lesion in the body of the pancreas measuring about 2.2 x 2.7 cm.  There is infiltration in the fat around the body and tail of the pancreas.  Mild pancreatic and bile duct dilatation.  Changes are most likely to represent pancreatitis with pseudocyst or wall off necrosis but a cystic mass is not excluded.  Correlation with clinical and laboratory evidence of pancreatitis is recommended.  If no evidence of pancreatitis, then elective MRI / MRCP is recommended for further evaluation of the lesion.  The liver, spleen, gallbladder, adrenal glands, kidneys, abdominal aorta, inferior vena cava, and retroperitoneal lymph nodes are unremarkable.  No free air or free fluid in the abdomen.  The stomach, small bowel, and colon are not abnormally distended.  Pelvis:  The uterus and ovaries are not enlarged.  Small amount of free fluid in the pelvis is likely to be physiologic.  The bladder wall is not thickened.  The appendix is not visualized.  No evidence of diverticulitis.  No significant pelvic lymphadenopathy. Normal alignment of the lumbar vertebrae.  IMPRESSION: 2.2 x 2.7 cm cystic mass in the body of the pancreas with associated inflammatory stranding around the body and tail the pancreas and mild pancreatic and bile duct dilatation.  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  Correlation with clinical and laboratory evidence of pancreatitis is recommended.  Small amount of free fluid in the pelvis is likely physiologic.   Original Report Authenticated By: Burman Nieves, M.D.    Ct Maxillofacial W/cm  09/10/2012  *RADIOLOGY REPORT*  Clinical Data: Left facial swelling 1 week after spider bite.  CT MAXILLOFACIAL WITH CONTRAST  Technique:  Multidetector CT imaging of the maxillofacial structures was performed with intravenous contrast. Multiplanar CT image reconstructions  were also generated.  Contrast:  50 ml Omnipaque-300  Comparison: None.  Findings: There is diffuse soft tissue swelling over the left supraorbital, periorbital, and anterior maxillary region.  There is contrast opacification of the edematous soft tissues.  This is consistent with inflammatory process such as cellulitis.  There is no loculated fluid collection to suggest an abscess.  The globes and extraocular muscles appear intact and symmetrical.  No preseptal or retrobulbar involvement.  The paranasal sinuses are clear.  Visualized bones appear intact.  No displaced orbital or facial fractures are identified.  There are prominent lymph nodes throughout the cervical regions bilaterally and in the angle of the mandible and submental spaces.  These are not pathologically enlarged and likely reactive.  IMPRESSION: Diffuse soft tissue swelling and enhancement over the left side of the face and supraorbital region most consistent with cellulitis. No discrete abscess is identified.  Diffuse  prominence of cervical lymph nodes is likely reactive.   Original Report Authenticated By: Burman Nieves, M.D.     Review of Systems  Constitutional: Positive for chills. Negative for fever, weight loss, malaise/fatigue and diaphoresis.  HENT:       As per HPI  Eyes: Negative.   Respiratory: Negative.   Cardiovascular: Negative.   Gastrointestinal: Negative.   Genitourinary: Negative.   Musculoskeletal: Negative.   Skin:       Redness swelling left face.  Neurological: Negative.   Endo/Heme/Allergies: Negative.   Psychiatric/Behavioral: Negative.    Blood pressure 102/62, pulse 108, temperature 97.8 F (36.6 C), temperature source Oral, resp. rate 20, height 5\' 1"  (1.549 m), weight 48.626 kg (107 lb 3.2 oz), last menstrual period 07/27/2012, SpO2 98.00%. Physical Exam  Constitutional: She is oriented to person, place, and time. She appears well-developed and well-nourished. No distress.  Multiple tattoos and  piercing's.    HENT:  Head: Normocephalic and atraumatic.    Eyes: Conjunctivae and EOM are normal. Pupils are equal, round, and reactive to light.  Neck: Normal range of motion. Neck supple.  Cardiovascular: Normal rate, regular rhythm and normal heart sounds.   Respiratory: Effort normal and breath sounds normal. No respiratory distress.  GI: Soft. Bowel sounds are normal. She exhibits no distension.  Neurological: She is alert and oriented to person, place, and time.  Skin: Skin is warm.  Except as per HEENT    Assessment/Plan: Facial cellulitis.  Continue current antibiotics, pain control.  Warm compresses.  Nothing to drain currently.  Will follow.    Mustafa Potts C 09/10/2012, 3:55 PM

## 2012-09-10 NOTE — Progress Notes (Signed)
ANTIBIOTIC CONSULT NOTE - INITIAL  Pharmacy Consult for Vancomycin Indication: facial cellulitis  Allergies  Allergen Reactions  . Doxycycline     headaches  . Sulfa Antibiotics Nausea And Vomiting and Other (See Comments)    Blurred vision and headaches   Patient Measurements: Height: 5\' 1"  (154.9 cm) Weight: 107 lb 3.2 oz (48.626 kg) IBW/kg (Calculated) : 47.8  Vital Signs: Temp: 99.6 F (37.6 C) (03/31 0439) Temp src: Oral (03/31 0439) BP: 124/81 mmHg (03/31 0439) Pulse Rate: 89 (03/31 0439) Intake/Output from previous day: 03/30 0701 - 03/31 0700 In: -  Out: 200 [Urine:200] Intake/Output from this shift:    Labs:  Recent Labs  09/09/12 2329  WBC 8.7  HGB 13.2  PLT 277  CREATININE 0.49*   Estimated Creatinine Clearance: 69.8 ml/min (by C-G formula based on Cr of 0.49). No results found for this basename: VANCOTROUGH, VANCOPEAK, VANCORANDOM, GENTTROUGH, GENTPEAK, GENTRANDOM, TOBRATROUGH, TOBRAPEAK, TOBRARND, AMIKACINPEAK, AMIKACINTROU, AMIKACIN,  in the last 72 hours   Medical History: Past Medical History  Diagnosis Date  . GERD (gastroesophageal reflux disease)   . Bacterial vaginosis   . Anxiety   . Depression   . Panic attacks   . Psoriasis    Medications:  Scheduled:  . antiseptic oral rinse  15 mL Mouth Rinse q12n4p  . [COMPLETED] cefTRIAXone (ROCEPHIN) IVPB 1 gram/50 mL D5W  1 g Intravenous Once  . [START ON 09/11/2012] cefTRIAXone (ROCEPHIN)  IV  1 g Intravenous Q24H  . chlorhexidine  15 mL Mouth Rinse BID  . [COMPLETED] diphenhydrAMINE  12.5 mg Intravenous Once  . [COMPLETED]  HYDROmorphone (DILAUDID) injection  1 mg Intravenous Once  . [START ON 09/11/2012] influenza  inactive virus vaccine  0.5 mL Intramuscular Tomorrow-1000  . LORazepam  1 mg Oral TID  . [COMPLETED] LORazepam  1 mg Oral Once  . [COMPLETED] morphine  2 mg Intravenous Once  . nicotine  14 mg Transdermal Daily  . [COMPLETED] ondansetron  4 mg Intravenous Once  . pantoprazole  (PROTONIX) IV  40 mg Intravenous Q24H  . [COMPLETED] potassium chloride SA  60 mEq Oral Once  . vancomycin  500 mg Intravenous Q12H  . [COMPLETED] vancomycin  1,000 mg Intravenous Once  . [DISCONTINUED] cefTRIAXone (ROCEPHIN) IM  250 mg Intramuscular Once   Assessment: 42yo female admitted with cellulitis on face after c/o insect bite. Pt has good renal fxn.  Estimated Creatinine Clearance: 69.8 ml/min (by C-G formula based on Cr of 0.49).  Pt weight reported as 48Kg.  Pt was given Vancomycin 1gm x 1 last pm on admission  Goal of Therapy:  Vancomycin trough level 10-15 mcg/ml  Plan: Vancomycin 500mg  IV q12hrs Check trough at steady state Monitor labs, renal fxn, and cultures per protocol Duration of therapy per MD  Valrie Hart A 09/10/2012,7:45 AM

## 2012-09-10 NOTE — Progress Notes (Signed)
INITIAL NUTRITION ASSESSMENT  DOCUMENTATION CODES Per approved criteria  -Not Applicable   INTERVENTION: Resource Breeze po BID, each supplement provides 250 kcal and 9 grams of protein.  NUTRITION DIAGNOSIS: Inadequate oral intake related to depression as evidenced by pt diet hx.   Goal: Pt to meet >/= 90% of their estimated nutrition needs  Monitor:  Po intake, labs and wt trends  Reason for Assessment: Malnurtriton Screen  42 y.o. female  Admitting Dx: Facial cellulitis  ASSESSMENT: Pt reports variable intake due to ongoing depression. She says that there have been some personal issues that has affected her appetite lately. Reported that she had been taking medication to "make her gain wt".   Emphasized the importance of adequate nutrition intake and she is agreeable to receive and oral supplement.   Hx includes lactose intolerance will add appropriate oral supplement, monitor diet advancement and meal intake. She does not meet criteria for malnutrition at this time.   Height: Ht Readings from Last 1 Encounters:  09/10/12 5\' 1"  (1.549 m)    Weight: Wt Readings from Last 1 Encounters:  09/10/12 107 lb 3.2 oz (48.626 kg)    Ideal Body Weight: 105# (47.7 kg)  % Ideal Body Weight: 102%  Wt Readings from Last 10 Encounters:  09/10/12 107 lb 3.2 oz (48.626 kg)  09/04/12 113 lb (51.256 kg)  06/28/12 113 lb (51.256 kg)  06/03/12 105 lb (47.628 kg)  05/25/12 116 lb (52.617 kg)  04/10/12 105 lb (47.628 kg)  02/09/12 116 lb (52.617 kg)  02/08/12 114 lb (51.71 kg)  02/02/12 114 lb (51.71 kg)  01/31/12 120 lb (54.432 kg)    Usual Body Weight: 105-115# varies per pt   BMI:  Body mass index is 20.27 kg/(m^2). Normal range  Estimated Nutritional Needs: Kcal: 1470-1616  Protein: 55-65 gr Fluid: >1600 ml/day  Skin: facial cellulitis (left-side)  Diet Order: Full Liquid  EDUCATION NEEDS: -Education needs addressed   Intake/Output Summary (Last 24 hours) at  09/10/12 1125 Last data filed at 09/10/12 0533  Gross per 24 hour  Intake      0 ml  Output    200 ml  Net   -200 ml    Last BM: 09/09/12  Labs:   Recent Labs Lab 09/09/12 2329  NA 140  K 2.9*  CL 102  CO2 28  BUN 4*  CREATININE 0.49*  CALCIUM 8.6  MG 1.9  GLUCOSE 105*    CBG (last 3)  No results found for this basename: GLUCAP,  in the last 72 hours  Scheduled Meds: . antiseptic oral rinse  15 mL Mouth Rinse q12n4p  . [START ON 09/11/2012] cefTRIAXone (ROCEPHIN)  IV  1 g Intravenous Q24H  . chlorhexidine  15 mL Mouth Rinse BID  . [START ON 09/11/2012] influenza  inactive virus vaccine  0.5 mL Intramuscular Tomorrow-1000  . LORazepam  1 mg Oral TID  . nicotine  14 mg Transdermal Daily  . pantoprazole (PROTONIX) IV  40 mg Intravenous Q24H  . vancomycin  500 mg Intravenous Q12H    Continuous Infusions: . 0.9 % NaCl with KCl 20 mEq / L 100 mL/hr at 09/10/12 1610    Past Medical History  Diagnosis Date  . GERD (gastroesophageal reflux disease)   . Bacterial vaginosis   . Anxiety   . Depression   . Panic attacks   . Psoriasis     Past Surgical History  Procedure Laterality Date  . Bladder surgery      X  3    Royann Shivers MS,RD,LDN,CSG Office: [phone] Pager: [phone]

## 2012-09-11 ENCOUNTER — Ambulatory Visit (HOSPITAL_COMMUNITY): Payer: Self-pay

## 2012-09-11 ENCOUNTER — Other Ambulatory Visit (HOSPITAL_COMMUNITY): Payer: Self-pay

## 2012-09-11 DIAGNOSIS — K862 Cyst of pancreas: Secondary | ICD-10-CM | POA: Diagnosis present

## 2012-09-11 DIAGNOSIS — F1020 Alcohol dependence, uncomplicated: Secondary | ICD-10-CM

## 2012-09-11 LAB — COMPREHENSIVE METABOLIC PANEL
ALT: 14 U/L (ref 0–35)
Alkaline Phosphatase: 103 U/L (ref 39–117)
Chloride: 108 mEq/L (ref 96–112)
GFR calc Af Amer: >90 mL/min (ref >90)
Glucose, Bld: 109 mg/dL — ABNORMAL HIGH (ref 70–99)
Potassium: 3.3 mEq/L — ABNORMAL LOW (ref 3.5–5.1)
Sodium: 139 mEq/L (ref 135–145)
Total Bilirubin: 0.2 mg/dL — ABNORMAL LOW (ref 0.3–1.2)
Total Protein: 5.4 g/dL — ABNORMAL LOW (ref 6.0–8.3)

## 2012-09-11 LAB — CBC
HCT: 32.3 % — ABNORMAL LOW (ref 36.0–46.0)
MCHC: 33.7 g/dL (ref 30.0–36.0)
MCV: 95 fL (ref 78.0–100.0)
Platelets: 255 10*3/uL (ref 150–400)
RBC: 3.4 MIL/uL — ABNORMAL LOW (ref 3.87–5.11)
RDW: 13.5 % (ref 11.5–15.5)

## 2012-09-11 MED ORDER — POTASSIUM CHLORIDE CRYS ER 20 MEQ PO TBCR
40.0000 meq | EXTENDED_RELEASE_TABLET | ORAL | Status: AC
  Administered 2012-09-11 (×2): 40 meq via ORAL
  Filled 2012-09-11 (×2): qty 2

## 2012-09-11 NOTE — Progress Notes (Signed)
Subjective: Denies abdominal pain, N/V. Tolerating diet, just doesn't like "hospital food". No diarrhea.   Objective: Vital signs in last 24 hours: Temp:  [97.8 F (36.6 C)-98.8 F (37.1 C)] 98.2 F (36.8 C) (04/01 0717) Pulse Rate:  [101-111] 102 (04/01 0823) Resp:  [19-20] 19 (04/01 0717) BP: (102-121)/(62-84) 119/73 mmHg (04/01 0717) SpO2:  [98 %] 98 % (04/01 0823) Last BM Date: 09/09/12 General:   Alert and oriented, pleasant Head:  Normocephalic and atraumatic. Eyes:  Improved periorbital cellulitis Mouth:  Without lesions, mucosa pink and moist.  Heart:  S1, S2 present, no murmurs noted.  Lungs: Clear to auscultation bilaterally, without wheezing, rales, or rhonchi.  Abdomen:  Bowel sounds present, soft, non-tender, non-distended. No HSM or hernias noted. No rebound or guarding. No masses appreciated  Msk:  Symmetrical without gross deformities. Normal posture. Extremities:  Without clubbing or edema. Neurologic:  Alert and  oriented x4;  grossly normal neurologically. Skin:  Left forehead lesion, small amount of purulent drainage.  Psych:  Alert and cooperative. Normal mood and affect.  Intake/Output from previous day: 03/31 0701 - 04/01 0700 In: 2640 [I.V.:2390; IV Piggyback:250] Out: 400 [Urine:400] Intake/Output this shift:    Lab Results:  Recent Labs  09/09/12 2329 09/11/12 0600  WBC 8.7 7.4  HGB 13.2 10.9*  HCT 38.2 32.3*  PLT 277 255   BMET  Recent Labs  09/09/12 2329 09/10/12 1034 09/11/12 0600  NA 140 140 139  K 2.9* 3.8 3.3*  CL 102 106 108  CO2 28 26 25   GLUCOSE 105* 103* 109*  BUN 4* 3* <3*  CREATININE 0.49* 0.40* 0.32*  CALCIUM 8.6 7.8* 8.0*   LFT  Recent Labs  09/09/12 2329 09/11/12 0600  PROT 6.5 5.4*  ALBUMIN 3.1* 2.4*  AST 22 15  ALT 21 14  ALKPHOS 127* 103  BILITOT 0.2* 0.2*  BILIDIR <0.1  --   IBILI NOT CALCULATED  --      Studies/Results: Ct Abdomen Pelvis W Contrast  09/10/2012  *RADIOLOGY REPORT*  Clinical  Data: Right greater than left abdominal pain.  CT ABDOMEN AND PELVIS WITH CONTRAST  Technique:  Multidetector CT imaging of the abdomen and pelvis was performed following the standard protocol during bolus administration of intravenous contrast.  Contrast: 50mL OMNIPAQUE IOHEXOL 300 MG/ML  SOLN, OMNIPAQUE IOHEXOL 300 MG/ML  SOLN  Comparison: 08/01/2008  Findings: Mild dependent atelectasis in the lung bases.  There is a circumscribed cystic appearing lesion in the body of the pancreas measuring about 2.2 x 2.7 cm.  There is infiltration in the fat around the body and tail of the pancreas.  Mild pancreatic and bile duct dilatation.  Changes are most likely to represent pancreatitis with pseudocyst or wall off necrosis but a cystic mass is not excluded.  Correlation with clinical and laboratory evidence of pancreatitis is recommended.  If no evidence of pancreatitis, then elective MRI / MRCP is recommended for further evaluation of the lesion.  The liver, spleen, gallbladder, adrenal glands, kidneys, abdominal aorta, inferior vena cava, and retroperitoneal lymph nodes are unremarkable.  No free air or free fluid in the abdomen.  The stomach, small bowel, and colon are not abnormally distended.  Pelvis:  The uterus and ovaries are not enlarged.  Small amount of free fluid in the pelvis is likely to be physiologic.  The bladder wall is not thickened.  The appendix is not visualized.  No evidence of diverticulitis.  No significant pelvic lymphadenopathy. Normal alignment of the lumbar  vertebrae.  IMPRESSION: 2.2 x 2.7 cm cystic mass in the body of the pancreas with associated inflammatory stranding around the body and tail the pancreas and mild pancreatic and bile duct dilatation.  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  Correlation with clinical and laboratory evidence of pancreatitis is recommended.  Small amount of free fluid in the pelvis is likely physiologic.   Original Report  Authenticated By: Burman Nieves, M.D.    Ct Maxillofacial W/cm  09/10/2012  *RADIOLOGY REPORT*  Clinical Data: Left facial swelling 1 week after spider bite.  CT MAXILLOFACIAL WITH CONTRAST  Technique:  Multidetector CT imaging of the maxillofacial structures was performed with intravenous contrast. Multiplanar CT image reconstructions were also generated.  Contrast:  50 ml Omnipaque-300  Comparison: None.  Findings: There is diffuse soft tissue swelling over the left supraorbital, periorbital, and anterior maxillary region.  There is contrast opacification of the edematous soft tissues.  This is consistent with inflammatory process such as cellulitis.  There is no loculated fluid collection to suggest an abscess.  The globes and extraocular muscles appear intact and symmetrical.  No preseptal or retrobulbar involvement.  The paranasal sinuses are clear.  Visualized bones appear intact.  No displaced orbital or facial fractures are identified.  There are prominent lymph nodes throughout the cervical regions bilaterally and in the angle of the mandible and submental spaces.  These are not pathologically enlarged and likely reactive.  IMPRESSION: Diffuse soft tissue swelling and enhancement over the left side of the face and supraorbital region most consistent with cellulitis. No discrete abscess is identified.  Diffuse prominence of cervical lymph nodes is likely reactive.   Original Report Authenticated By: Burman Nieves, M.D.     Assessment: 42 year old female with history of chronic abdominal pain, IBS, admitted with facial cellulitis and noted to have a 2.2 X 2.7 cystic mass in body of the pancreas, mild pancreatic and bile duct dilation. Normal lipase, mildly isolated elevation of alk phos at 127 on admission now normalized. Patient is a poor historian, and her lower abdominal discomfort may be secondary to IBS +/- GYN issues. Clinically improved from a GI standpoint. Will follow peripherally and plan  on outpatient EUS with Dr. Christella Hartigan for further evaluation of pancreatic mass.    Also noted early satiety, vague dysphagia symptoms on admission. Further work-up with EGD +/- dilation with Dr. Darrick Penna as outpatient. Will arrange outpatient follow-up with our office.    Plan: EUS as outpatient BID PPI Outpatiet follow-up for IBS, consideration of EGD/dilation Follow peripherally    LOS: 2 days    09/11/2012, 12:24 PM   Seen this afternoon. She continues to deny abdominal pain. She tolerated  lunch without any difficulties. Her abdomen is soft and nontender. Agree with above impression and recommendations as outlined.

## 2012-09-11 NOTE — Progress Notes (Signed)
[facility] PROGRESS NOTE  SYLWIA CUERVO NWG:[id number] DOB: [birth year] DOA: 09/09/2012 PCP: Kirk Ruths, MD  Assessment/Plan: #1. facial cellulitis involving left side of the face: Probably related to the insect/spider bite. CT of the maxillofacial region diffuse soft tissue swelling and enhancement over the left side of the face and supraorbital region most consistent with cellulitis. No discrete abscess is identified. Not much improvement this am but no worse. Area above eyebrow open slightly and draining purulent drainage, small amount. No odor. Appreciate surgeries input. will continue vancomycin and ceftriaxone day #2. Pain control inproved.  Continue warm compress to area. Only slightly fluctuant. Afebrile.    #2 abdominal pain: continues with tenderness in the right lower quadrant. Abdominal Ct with2.2 x 2.7 cm cystic mass in the body of the pancreas with associated inflammatory stranding around the body and tail the pancreas and mild pancreatic and bile duct dilatation. 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 Small amount of free fluid in the pelvis is likely physiologic. Appreciate GI assistance. Will need OP EUS to further evaulate cystic pancreatic mass. Advance diet  #3 history of depression and anxiety: Currently at baseline Continue Ativan.   #4 hypokalemia: will replete and recheck.  Magnesium level within normal limits.    #5 tobacco abuse: Nicotine patch will be prescribed . Smoking cessation counseling.      Code Status: full Family Communication: pt at bedside Disposition Plan: home when ready hopefully day or 2.    Consultants:  Dr. Leticia Penna Surgery  Dr. Darrick Penna GI  Procedures:  none  Antibiotics:  Vancomycin 09/10/12>>>  Rocephin 09/09/12>>>   HPI/Subjective: Sitting up in bed. NAD  Objective: Filed Vitals:   09/10/12 1517 09/10/12 2233 09/11/12 0717 09/11/12 0823  BP: 102/62 121/84 119/73   Pulse: 108 111 101  102  Temp: 97.8 F (36.6 C) 98.8 F (37.1 C) 98.2 F (36.8 C)   TempSrc: Oral Oral Oral   Resp: 20 20 19    Height:      Weight:      SpO2: 98% 98% 98% 98%    Intake/Output Summary (Last 24 hours) at 09/11/12 1005 Last data filed at 09/11/12 0500  Gross per 24 hour  Intake   2640 ml  Output    400 ml  Net   2240 ml   Filed Weights   09/10/12 0439  Weight: 48.626 kg (107 lb 3.2 oz)    Exam:   General:  Awake alert NAD  Cardiovascular: RRR No MGR No LE edema  Respiratory: normal effort. BS clear bilaterally. No wheeze no rhonchi  Abdomen: flat soft +BS mild tenderness to palpation diffusely. No guarding or rebound  Musculoskeletal: no clubbing/cyanosis   Data Reviewed: Basic Metabolic Panel:  Recent Labs Lab 09/09/12 2329 09/10/12 1034 09/11/12 0600  NA 140 140 139  K 2.9* 3.8 3.3*  CL 102 106 108  CO2 28 26 25   GLUCOSE 105* 103* 109*  BUN 4* 3* <3*  CREATININE 0.49* 0.40* 0.32*  CALCIUM 8.6 7.8* 8.0*  MG 1.9  --   --    Liver Function Tests:  Recent Labs Lab 09/09/12 2329 09/11/12 0600  AST 22 15  ALT 21 14  ALKPHOS 127* 103  BILITOT 0.2* 0.2*  PROT 6.5 5.4*  ALBUMIN 3.1* 2.4*    Recent Labs Lab 09/10/12  LIPASE 31   No results found for this basename: AMMONIA,  in the last 168 hours CBC:  Recent Labs Lab 09/06/12 1924 09/09/12 2329 09/11/12 0600  WBC 7.2 8.7 7.4  NEUTROABS 5.5 6.4  --   HGB 13.5 13.2 10.9*  HCT 38.2 38.2 32.3*  MCV 94.6 94.3 95.0  PLT 299 277 255   Cardiac Enzymes: No results found for this basename: CKTOTAL, CKMB, CKMBINDEX, TROPONINI,  in the last 168 hours BNP (last 3 results) No results found for this basename: PROBNP,  in the last 8760 hours CBG: No results found for this basename: GLUCAP,  in the last 168 hours  Recent Results (from the past 240 hour(s))  GC/CHLAMYDIA PROBE AMP     Status: None   Collection Time    09/06/12  7:47 PM      Result Value Range Status   CT Probe RNA NEGATIVE  NEGATIVE  Final   GC Probe RNA NEGATIVE  NEGATIVE Final   Comment: (NOTE)                                                                                              Normal Reference Range: Negative          Assay performed using the Gen-Probe APTIMA COMBO2 (R) Assay.     Acceptable specimen types for this assay include APTIMA Swabs (Unisex,     endocervical, urethral, or vaginal), first void urine, and ThinPrep     liquid based cytology samples.  WET PREP, GENITAL     Status: Abnormal   Collection Time    09/06/12  7:47 PM      Result Value Range Status   Yeast Wet Prep HPF POC NONE SEEN  NONE SEEN Final   Trich, Wet Prep NONE SEEN  NONE SEEN Final   Clue Cells Wet Prep HPF POC NONE SEEN  NONE SEEN Final   WBC, Wet Prep HPF POC FEW (*) NONE SEEN Final     Studies: Ct Abdomen Pelvis W Contrast  09/10/2012  *RADIOLOGY REPORT*  Clinical Data: Right greater than left abdominal pain.  CT ABDOMEN AND PELVIS WITH CONTRAST  Technique:  Multidetector CT imaging of the abdomen and pelvis was performed following the standard protocol during bolus administration of intravenous contrast.  Contrast: 50mL OMNIPAQUE IOHEXOL 300 MG/ML  SOLN, OMNIPAQUE IOHEXOL 300 MG/ML  SOLN  Comparison: 08/01/2008  Findings: Mild dependent atelectasis in the lung bases.  There is a circumscribed cystic appearing lesion in the body of the pancreas measuring about 2.2 x 2.7 cm.  There is infiltration in the fat around the body and tail of the pancreas.  Mild pancreatic and bile duct dilatation.  Changes are most likely to represent pancreatitis with pseudocyst or wall off necrosis but a cystic mass is not excluded.  Correlation with clinical and laboratory evidence of pancreatitis is recommended.  If no evidence of pancreatitis, then elective MRI / MRCP is recommended for further evaluation of the lesion.  The liver, spleen, gallbladder, adrenal glands, kidneys, abdominal aorta, inferior vena cava, and retroperitoneal lymph nodes  are unremarkable.  No free air or free fluid in the abdomen.  The stomach, small bowel, and colon are not abnormally distended.  Pelvis:  The uterus and ovaries are not enlarged.  Small amount of free fluid in the pelvis is likely to be physiologic.  The bladder wall is not thickened.  The appendix is not visualized.  No evidence of diverticulitis.  No significant pelvic lymphadenopathy. Normal alignment of the lumbar vertebrae.  IMPRESSION: 2.2 x 2.7 cm cystic mass in the body of the pancreas with associated inflammatory stranding around the body and tail the pancreas and mild pancreatic and bile duct dilatation.  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  Correlation with clinical and laboratory evidence of pancreatitis is recommended.  Small amount of free fluid in the pelvis is likely physiologic.   Original Report Authenticated By: Burman Nieves, M.D.    Ct Maxillofacial W/cm  09/10/2012  *RADIOLOGY REPORT*  Clinical Data: Left facial swelling 1 week after spider bite.  CT MAXILLOFACIAL WITH CONTRAST  Technique:  Multidetector CT imaging of the maxillofacial structures was performed with intravenous contrast. Multiplanar CT image reconstructions were also generated.  Contrast:  50 ml Omnipaque-300  Comparison: None.  Findings: There is diffuse soft tissue swelling over the left supraorbital, periorbital, and anterior maxillary region.  There is contrast opacification of the edematous soft tissues.  This is consistent with inflammatory process such as cellulitis.  There is no loculated fluid collection to suggest an abscess.  The globes and extraocular muscles appear intact and symmetrical.  No preseptal or retrobulbar involvement.  The paranasal sinuses are clear.  Visualized bones appear intact.  No displaced orbital or facial fractures are identified.  There are prominent lymph nodes throughout the cervical regions bilaterally and in the angle of the mandible and submental  spaces.  These are not pathologically enlarged and likely reactive.  IMPRESSION: Diffuse soft tissue swelling and enhancement over the left side of the face and supraorbital region most consistent with cellulitis. No discrete abscess is identified.  Diffuse prominence of cervical lymph nodes is likely reactive.   Original Report Authenticated By: Burman Nieves, M.D.     Scheduled Meds: . antiseptic oral rinse  15 mL Mouth Rinse q12n4p  . cefTRIAXone (ROCEPHIN)  IV  1 g Intravenous Q24H  . chlorhexidine  15 mL Mouth Rinse BID  . feeding supplement  1 Container Oral BID BM  . LORazepam  1 mg Oral TID  . nicotine  14 mg Transdermal Daily  . pantoprazole  40 mg Oral BID AC  . vancomycin  500 mg Intravenous Q12H   Continuous Infusions: . 0.9 % NaCl with KCl 20 mEq / L 100 mL/hr at 09/11/12 0500    Principal Problem:   Facial cellulitis Active Problems:   Abdominal pain   Insect bite   Depression   Anxiety   Hypokalemia   Tobacco abuse    Time spent: 30 minutes    [facility] M  [facility]  If 7PM-7AM, please contact night-coverage at [email or web address], password [facility] 09/11/2012, 10:05 AM  LOS: 2 days   Attending note:  Patient independently seen and examined.  Above note amended.  Agree with note as above.  Cellulitis and facial edema has significantly improved. Consider discharge home tomorrow on oral antibiotics if continued improvement.  Cassie Henkels

## 2012-09-11 NOTE — Progress Notes (Signed)
Subjective: Facial pain in proving. No fevers or chills.  Objective: Vital signs in last 24 hours: Temp:  [97.8 F (36.6 C)-98.8 F (37.1 C)] 98.2 F (36.8 C) (04/01 0717) Pulse Rate:  [101-111] 102 (04/01 0823) Resp:  [19-20] 19 (04/01 0717) BP: (102-121)/(62-84) 119/73 mmHg (04/01 0717) SpO2:  [98 %] 98 % (04/01 0823) Last BM Date: 09/09/12  Intake/Output from previous day: 03/31 0701 - 04/01 0700 In: 2640 [I.V.:2390; IV Piggyback:250] Out: 400 [Urine:400] Intake/Output this shift:    General appearance: alert and no distress Head: Left brow mass with minimal discharge. Softer,  facial swelling improved. Erythema limited to around left brow. Some ecchymosis around left eye.  Lab Results:   Recent Labs  09/09/12 2329 09/11/12 0600  WBC 8.7 7.4  HGB 13.2 10.9*  HCT 38.2 32.3*  PLT 277 255   BMET  Recent Labs  09/10/12 1034 09/11/12 0600  NA 140 139  K 3.8 3.3*  CL 106 108  CO2 26 25  GLUCOSE 103* 109*  BUN 3* <3*  CREATININE 0.40* 0.32*  CALCIUM 7.8* 8.0*   PT/INR No results found for this basename: LABPROT, INR,  in the last 72 hours ABG No results found for this basename: PHART, PCO2, PO2, HCO3,  in the last 72 hours  Studies/Results: Ct Abdomen Pelvis W Contrast  09/10/2012  *RADIOLOGY REPORT*  Clinical Data: Right greater than left abdominal pain.  CT ABDOMEN AND PELVIS WITH CONTRAST  Technique:  Multidetector CT imaging of the abdomen and pelvis was performed following the standard protocol during bolus administration of intravenous contrast.  Contrast: 50mL OMNIPAQUE IOHEXOL 300 MG/ML  SOLN, OMNIPAQUE IOHEXOL 300 MG/ML  SOLN  Comparison: 08/01/2008  Findings: Mild dependent atelectasis in the lung bases.  There is a circumscribed cystic appearing lesion in the body of the pancreas measuring about 2.2 x 2.7 cm.  There is infiltration in the fat around the body and tail of the pancreas.  Mild pancreatic and bile duct dilatation.  Changes are most  likely to represent pancreatitis with pseudocyst or wall off necrosis but a cystic mass is not excluded.  Correlation with clinical and laboratory evidence of pancreatitis is recommended.  If no evidence of pancreatitis, then elective MRI / MRCP is recommended for further evaluation of the lesion.  The liver, spleen, gallbladder, adrenal glands, kidneys, abdominal aorta, inferior vena cava, and retroperitoneal lymph nodes are unremarkable.  No free air or free fluid in the abdomen.  The stomach, small bowel, and colon are not abnormally distended.  Pelvis:  The uterus and ovaries are not enlarged.  Small amount of free fluid in the pelvis is likely to be physiologic.  The bladder wall is not thickened.  The appendix is not visualized.  No evidence of diverticulitis.  No significant pelvic lymphadenopathy. Normal alignment of the lumbar vertebrae.  IMPRESSION: 2.2 x 2.7 cm cystic mass in the body of the pancreas with associated inflammatory stranding around the body and tail the pancreas and mild pancreatic and bile duct dilatation.  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  Correlation with clinical and laboratory evidence of pancreatitis is recommended.  Small amount of free fluid in the pelvis is likely physiologic.   Original Report Authenticated By: Burman Nieves, M.D.    Ct Maxillofacial W/cm  09/10/2012  *RADIOLOGY REPORT*  Clinical Data: Left facial swelling 1 week after spider bite.  CT MAXILLOFACIAL WITH CONTRAST  Technique:  Multidetector CT imaging of the maxillofacial structures was performed  with intravenous contrast. Multiplanar CT image reconstructions were also generated.  Contrast:  50 ml Omnipaque-300  Comparison: None.  Findings: There is diffuse soft tissue swelling over the left supraorbital, periorbital, and anterior maxillary region.  There is contrast opacification of the edematous soft tissues.  This is consistent with inflammatory process such as cellulitis.   There is no loculated fluid collection to suggest an abscess.  The globes and extraocular muscles appear intact and symmetrical.  No preseptal or retrobulbar involvement.  The paranasal sinuses are clear.  Visualized bones appear intact.  No displaced orbital or facial fractures are identified.  There are prominent lymph nodes throughout the cervical regions bilaterally and in the angle of the mandible and submental spaces.  These are not pathologically enlarged and likely reactive.  IMPRESSION: Diffuse soft tissue swelling and enhancement over the left side of the face and supraorbital region most consistent with cellulitis. No discrete abscess is identified.  Diffuse prominence of cervical lymph nodes is likely reactive.   Original Report Authenticated By: Burman Nieves, M.D.     Anti-infectives: Anti-infectives   Start     Dose/Rate Route Frequency Ordered Stop   09/11/12 0000  cefTRIAXone (ROCEPHIN) 1 g in dextrose 5 % 50 mL IVPB     1 g 100 mL/hr over 30 Minutes Intravenous Every 24 hours 09/10/12 0446     09/10/12 1000  vancomycin (VANCOCIN) 500 mg in sodium chloride 0.9 % 100 mL IVPB     500 mg 100 mL/hr over 60 Minutes Intravenous Every 12 hours 09/10/12 0744     09/10/12 0000  cefTRIAXone (ROCEPHIN) 1 g in dextrose 5 % 50 mL IVPB     1 g 100 mL/hr over 30 Minutes Intravenous  Once 09/09/12 2353 09/10/12 0059   09/09/12 2330  vancomycin (VANCOCIN) IVPB 1000 mg/200 mL premix     1,000 mg 200 mL/hr over 60 Minutes Intravenous  Once 09/09/12 2324 09/10/12 0238   09/09/12 2330  cefTRIAXone (ROCEPHIN) injection 250 mg  Status:  Discontinued     250 mg Intramuscular  Once 09/09/12 2324 09/09/12 2353      Assessment/Plan: s/p * No surgery found * Left brow wound: Improving. Continue warm compresses. At this time there is nothing to surgically drain. Continue IV antibiotics for at least the next 24 hours with consideration of switching to oral antibiotics tomorrow. Continue warm  compresses. Likely discharge in the next 24 hours. Patient can followup with me as an outpatient if she continues to improve in the next week or 2.  LOS: 2 days    Nox Talent C 09/11/2012

## 2012-09-12 LAB — CBC
MCH: 31.9 pg (ref 26.0–34.0)
MCV: 95.8 fL (ref 78.0–100.0)
Platelets: 279 10*3/uL (ref 150–400)
RDW: 13.4 % (ref 11.5–15.5)
WBC: 4.8 10*3/uL (ref 4.0–10.5)

## 2012-09-12 LAB — BASIC METABOLIC PANEL
Calcium: 8.1 mg/dL — ABNORMAL LOW (ref 8.4–10.5)
Creatinine, Ser: 0.37 mg/dL — ABNORMAL LOW (ref 0.50–1.10)
GFR calc Af Amer: >90 mL/min (ref >90)

## 2012-09-12 LAB — VANCOMYCIN, TROUGH: Vancomycin Tr: 5 ug/mL — ABNORMAL LOW (ref 10.0–20.0)

## 2012-09-12 MED ORDER — CEPHALEXIN 500 MG PO CAPS: 500.0000 mg | ORAL_CAPSULE | Freq: Two times a day (BID) | ORAL | Status: DC

## 2012-09-12 MED ORDER — HYDROCODONE-ACETAMINOPHEN 5-325 MG PO TABS: 1.0000 | ORAL_TABLET | ORAL | Status: DC | PRN

## 2012-09-12 MED ORDER — CLINDAMYCIN HCL 300 MG PO CAPS: 300.0000 mg | ORAL_CAPSULE | Freq: Three times a day (TID) | ORAL | Status: AC

## 2012-09-12 MED ORDER — PANTOPRAZOLE SODIUM 40 MG PO TBEC: 40.0000 mg | DELAYED_RELEASE_TABLET | Freq: Two times a day (BID) | ORAL | Status: DC

## 2012-09-12 MED ORDER — NICOTINE 14 MG/24HR TD PT24: 1.0000 | MEDICATED_PATCH | Freq: Every day | TRANSDERMAL | Status: DC

## 2012-09-12 MED ORDER — LORAZEPAM 1 MG PO TABS: 1.0000 mg | ORAL_TABLET | Freq: Three times a day (TID) | ORAL | Status: DC | PRN

## 2012-09-12 MED ORDER — VANCOMYCIN HCL IN DEXTROSE 750-5 MG/150ML-% IV SOLN
750.0000 mg | Freq: Two times a day (BID) | INTRAVENOUS | Status: DC
  Filled 2012-09-12 (×2): qty 150

## 2012-09-12 MED ORDER — BOOST / RESOURCE BREEZE PO LIQD: 1.0000 | Freq: Two times a day (BID) | ORAL | Status: DC

## 2012-09-12 MED ORDER — VANCOMYCIN HCL 500 MG IV SOLR
250.0000 mg | Freq: Once | INTRAVENOUS | Status: AC
  Administered 2012-09-12: 250 mg via INTRAVENOUS
  Filled 2012-09-12: qty 250

## 2012-09-12 NOTE — Progress Notes (Signed)
Pt. Given Rx and states understanding.  Thane Edu, RN

## 2012-09-12 NOTE — Progress Notes (Signed)
Patient seen, independently examined and chart reviewed. I agree with exam, assessment and plan discussed with Toya Smothers, NP.  Subjective: Overall feels much improved. Some drainage from area above her left eye but edema markedly improved. Minimal pain. Like to go home. Tolerating diet without difficulty.  Objective: Afebrile, vital signs stable. No edema of the eyelids bilaterally. Area of induration supraorbital process on the left with 0.5 mm opening and small amount of yellowish drainage. Nontender to touch. No fluctuance. Cardiovascular regular rate and rhythm. No murmur, rub, gallop. Respiratory clear to auscultation bilaterally. No wheezes, rales, rhonchi. Normal respiratory effort.  Labs: Basic metabolic panel that unremarkable. Hemoglobin stable, normal WBC. HIV nonreactive.  Acute issues:  Left facial cellulitis: Much improved. History suggests CA-MRSA. No culture obtained. No previous culture data of note. No reason to suspect gram-negative organism.  Possible pancreatitis, cystic pancreatic mass  Plan:  Change to oral antibiotics. Clindamycin 300 mg po TID x7 days  Twice a day PPI  Endoscopic ultrasound as outpatient  Outpatient followup with GI for IBS, consideration of EGD/dilatation  Discharge home today, followup as recommended.  Summary: 42 year old woman PMH including IBS, chronic abdominal pain presented with redness left forehead and possible insect bite, she was prescribed Bactrim and was discharged. She returned one day later with increased facial swelling. She was admitted for facial cellulitis of the left side of the face. She was started on IV vancomycin and Rocephin. CT demonstrated soft tissue swelling without abscess. Surgical consult was obtained but lesion drained spontaneously and did not require I&D.  Additional complaint was ongoing abdominal pain. Urine pregnancy test was negative. Urinalysis was negative. Recent pelvic exam was negative. CT of the  abdomen and pelvis was obtained because of ongoing abdominal pain over the course of several ER visits. This revealed a cystic mass in the body of the pancreas in mild pancreatic and bile duct dilatation. The patient was seen by gastroenterology. It was theorized that her abdominal discomfort was possibly secondary to IBS plus/minus GYN issues. Endoscopic ultrasound was recommended as outpatient. No role for MRCP. Outpatient followup for IBS, dysphagia recommended and EGD and endoscopy plan once cellulitis improved. She was counseled to avoid alcohol.    Brendia Sacks, MD [facility] [phone]

## 2012-09-12 NOTE — Clinical Social Work Note (Signed)
CSW met w patient to continue to assess patient need for help w grief and depression, patient has multiple life stresses including autistic adult son, estrangement from 2 daughters, loss of husband 3 years ago w unresolved grief, current concerns about alcohol use.  Patient requested CSW make appointment w [facility] - patient had been involved w their services as recently as 06/2012 and had been referred for individual therapy, groups and possible medications management.  Patient now aware of transportation help provided by [facility] and states that she would like to reconnect w mental health services, asked CSW to refer her for hospital discharge appointment at [facility] in [place].  CSW scheduled patient for hospital discharge clinic appt on Tuesday April 8, arrive 8:30 - 11 AM, at [facility], [address], [place], phone [phone].  Patient notified.  Santa Genera, LCSW Clinical Social Worker [phone])

## 2012-09-12 NOTE — Discharge Summary (Signed)
Physician Discharge Summary  Monique Nguyen ZOX:[id number] DOB: [birth year] DOA: 09/09/2012  PCP: Kirk Ruths, MD  Admit date: 09/09/2012 Discharge date: 09/13/2012  Time spent: 40 minutes  Recommendations for Outpatient Follow-up:  1. Has appt [facility] 09/18/12 for OP follow up for depression.  2. Follow up with Dr. Leticia Penna in 2 weeks for evaluation symptoms.  3. Follow up with Dr. Christella Hartigan in 2 weeks. Will need EUS as OP and possibly work up for IBS,   Discharge Diagnoses:  Principal Problem:   Facial cellulitis Active Problems:   Abdominal pain   Insect bite   Depression   Anxiety   Hypokalemia   Tobacco abuse   Pancreatic cyst   Discharge Condition: stable  Diet recommendation: regular  Filed Weights   09/10/12 0439  Weight: 48.626 kg (107 lb 3.2 oz)  History of present illness:  Monique Nguyen is a 42 y.o. female with a past medical history of depression and anxiety, who was in her usual state of health till about  a week prior to presentation on 09/07/12, when she felt an insect bite on the left forehead. She was inside the house when this happened and thinks that it was most likely a spider. Patient also had been having nausea, vomiting, for about a week prior to presentation along with lower abdominal pain. She also had some spotting from her vagina so she came to ED. She underwent a pelvic examination. She was prescribed Benadryl for the insect bite. She then presented back on S3/31/14 as the swelling in the face was getting worse. She was examined and was prescribed Bactrim. Patient's symptoms of nausea, vomiting, have persisted. The pain  located in the lower abdomen and was about 6/10 in intensity. Denied any vaginal discharge currently. Denied any diarrhea. She had vomited a few times  consisting of clear liquids but mostly retching and dry heaves. The pain in the left side of the face is a throbbing pain. 10 out of 10 in intensity. She was given morphine, which  had not really helped. She had chills at home and thinks that she had a fever, however, did not check a temperature. She said she hasn't had a menstrual period in quite a while, but she had some vaginal bleeding in January. The left side of the face did itch quite a bit. She denied any difficulty swallowing. She did have some visual blurring, but was able to see through the left eye. She did have a headache, mostly  located in that lesion in the left forehead.  Hospital Course:  #1. facial cellulitis involving left side of the face: Probably related to the insect/spider bite. CT of the maxillofacial region diffuse soft tissue swelling and enhancement over the left side of the face and supraorbital region most consistent with cellulitis. No discrete abscess is identified. Pt started on Vanc and rocephin and received same for 3 days. She also received warm compresses to the area. The area above eyebrow slowly responded by opening slightly and draining purulent drainage, small amount. Swelling decreased and erythema decreased. Pt seen by Dr. Leticia Penna surgery who opined that since area draining there was no need I &D. He recommended continuing antibiotics at discharge and following up with him in 2 weeks. At discharge pt will be given Clindamycin  for 7 more days. Pt remained afebrile and non-toxic during hospitalization.    #2 abdominal pain/pancreatic cyst: Abdominal Ct with2.2 x 2.7 cm cystic mass in the body of the pancreas with associated  inflammatory stranding around the body and tail the pancreas and mild pancreatic and bile duct dilatation. 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 Small amount of free fluid in the pelvis is likely physiologic. Lipase normal and mildly isolated elevation of alk phos at 127 on admission normalized by 09/11/12. Pt had wet prep with few WBC otherwise negative.  Pt seen by Dr. Jena Gauss who opines that pt will l need OP EUS to further evaulate cystic  pancreatic mass. To follow up with Dr. Christella Hartigan in 2 weeks. At discharge pt tolerating regular diet without problem  #3 history of depression and anxiety: remained at baseline during hospitalization. SW consulted for unresolved grief. Pt has appointment 09/18/12 [facility] in [place].   #4 hypokalemia: Repleted and resolved.  Magnesium level within normal limits.  #5 tobacco abuse: Nicotine patch will be prescribed . Smoking cessation counseling.       Procedures: none Consultations: Dr. Leticia Penna Surgery Dr Jena Gauss GI  Discharge Exam: Filed Vitals:   09/11/12 1520 09/11/12 2230 09/12/12 0559 09/12/12 1410  BP: 109/72 111/80 122/82 114/80  Pulse: 118 98 91 103  Temp: 98.6 F (37 C) 98.1 F (36.7 C) 97.9 F (36.6 C) 98 F (36.7 C)  TempSrc:  Oral Oral   Resp: 18 20 17 18   Height:      Weight:      SpO2: 97% 99% 99% 99%    General: awake alert NAD Cardiovascular: RRR No MGR No LE edema Respiratory: normal effort BS clear bilaterally no wheeze no rhonchi Skin: Left brow mass improved with less erythema, less drainage, less swelling. Area with pale open center and small amount purulent drainage. No odor    Discharge Instructions      Discharge Orders   Future Orders Complete By Expires     Call MD for:  persistant nausea and vomiting  As directed     Call MD for:  temperature >100.4  As directed     Diet - low sodium heart healthy  As directed     Increase activity slowly  As directed         Medication List    STOP taking these medications       sulfamethoxazole-trimethoprim 800-160 MG per tablet  Commonly known as:  SEPTRA DS      TAKE these medications       acetaminophen 325 MG tablet  Commonly known as:  TYLENOL  Take 650 mg by mouth every 6 (six) hours as needed for pain.     clindamycin 300 MG capsule  Commonly known as:  CLEOCIN  Take 1 capsule (300 mg total) by mouth 3 (three) times daily.     feeding supplement Liqd  Take 1 Container by mouth 2  (two) times daily between meals.     HYDROcodone-acetaminophen 5-325 MG per tablet  Commonly known as:  NORCO/VICODIN  Take 1 tablet by mouth every 4 (four) hours as needed.     LORazepam 1 MG tablet  Commonly known as:  ATIVAN  Take 1 tablet (1 mg total) by mouth 3 (three) times daily as needed for anxiety.     nicotine 14 mg/24hr patch  Commonly known as:  NICODERM CQ - dosed in mg/24 hours  Place 1 patch onto the skin daily.     pantoprazole 40 MG tablet  Commonly known as:  PROTONIX  Take 1 tablet (40 mg total) by mouth 2 (two) times daily before a meal.  Follow-up Information   Follow up with [facility] On 09/18/2012. [facility] discharge clinic, arrive between 8:30 - 11 AM to be seen)    Contact information:   [facility] [address] [place], [place] [phone]      Follow up with Fabio Bering, MD. Schedule an appointment as soon as possible for a visit in 2 weeks.   Contact information:   [address] [place] [address] [phone]       Follow up with Rob Bunting, MD. Schedule an appointment as soon as possible for a visit in 2 weeks.   Contact information:   [address] [place] [address] [phone]        The results of significant diagnostics from this hospitalization (including imaging, microbiology, ancillary and laboratory) are listed below for reference.    Significant Diagnostic Studies: Ct Abdomen Pelvis W Contrast  09/10/2012  *RADIOLOGY REPORT*  Clinical Data: Right greater than left abdominal pain.  CT ABDOMEN AND PELVIS WITH CONTRAST  Technique:  Multidetector CT imaging of the abdomen and pelvis was performed following the standard protocol during bolus administration of intravenous contrast.  Contrast: 50mL OMNIPAQUE IOHEXOL 300 MG/ML  SOLN, OMNIPAQUE IOHEXOL 300 MG/ML  SOLN  Comparison: 08/01/2008  Findings: Mild dependent atelectasis in the lung bases.  There is a circumscribed cystic appearing lesion  in the body of the pancreas measuring about 2.2 x 2.7 cm.  There is infiltration in the fat around the body and tail of the pancreas.  Mild pancreatic and bile duct dilatation.  Changes are most likely to represent pancreatitis with pseudocyst or wall off necrosis but a cystic mass is not excluded.  Correlation with clinical and laboratory evidence of pancreatitis is recommended.  If no evidence of pancreatitis, then elective MRI / MRCP is recommended for further evaluation of the lesion.  The liver, spleen, gallbladder, adrenal glands, kidneys, abdominal aorta, inferior vena cava, and retroperitoneal lymph nodes are unremarkable.  No free air or free fluid in the abdomen.  The stomach, small bowel, and colon are not abnormally distended.  Pelvis:  The uterus and ovaries are not enlarged.  Small amount of free fluid in the pelvis is likely to be physiologic.  The bladder wall is not thickened.  The appendix is not visualized.  No evidence of diverticulitis.  No significant pelvic lymphadenopathy. Normal alignment of the lumbar vertebrae.  IMPRESSION: 2.2 x 2.7 cm cystic mass in the body of the pancreas with associated inflammatory stranding around the body and tail the pancreas and mild pancreatic and bile duct dilatation.  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  Correlation with clinical and laboratory evidence of pancreatitis is recommended.  Small amount of free fluid in the pelvis is likely physiologic.   Original Report Authenticated By: Burman Nieves, M.D.    Ct Maxillofacial W/cm  09/10/2012  *RADIOLOGY REPORT*  Clinical Data: Left facial swelling 1 week after spider bite.  CT MAXILLOFACIAL WITH CONTRAST  Technique:  Multidetector CT imaging of the maxillofacial structures was performed with intravenous contrast. Multiplanar CT image reconstructions were also generated.  Contrast:  50 ml Omnipaque-300  Comparison: None.  Findings: There is diffuse soft tissue swelling over the  left supraorbital, periorbital, and anterior maxillary region.  There is contrast opacification of the edematous soft tissues.  This is consistent with inflammatory process such as cellulitis.  There is no loculated fluid collection to suggest an abscess.  The globes and extraocular muscles appear  intact and symmetrical.  No preseptal or retrobulbar involvement.  The paranasal sinuses are clear.  Visualized bones appear intact.  No displaced orbital or facial fractures are identified.  There are prominent lymph nodes throughout the cervical regions bilaterally and in the angle of the mandible and submental spaces.  These are not pathologically enlarged and likely reactive.  IMPRESSION: Diffuse soft tissue swelling and enhancement over the left side of the face and supraorbital region most consistent with cellulitis. No discrete abscess is identified.  Diffuse prominence of cervical lymph nodes is likely reactive.   Original Report Authenticated By: Burman Nieves, M.D.     Microbiology: Recent Results (from the past 240 hour(s))  GC/CHLAMYDIA PROBE AMP     Status: None   Collection Time    09/06/12  7:47 PM      Result Value Range Status   CT Probe RNA NEGATIVE  NEGATIVE Final   GC Probe RNA NEGATIVE  NEGATIVE Final   Comment: (NOTE)                                                                                             Normal Reference Range: Negative          Assay performed using the Gen-Probe APTIMA COMBO2 (R) Assay.     Acceptable specimen types for this assay include APTIMA Swabs (Unisex,     endocervical, urethral, or vaginal), first void urine, and ThinPrep     liquid based cytology samples.  WET PREP, GENITAL     Status: Abnormal   Collection Time    09/06/12  7:47 PM      Result Value Range Status   Yeast Wet Prep HPF POC NONE SEEN  NONE SEEN Final   Trich, Wet Prep NONE SEEN  NONE SEEN Final   Clue Cells Wet Prep HPF POC NONE SEEN  NONE SEEN Final   WBC, Wet Prep HPF POC FEW  (*) NONE SEEN Final     Labs: Basic Metabolic Panel:  Recent Labs Lab 09/09/12 2329 09/10/12 1034 09/11/12 0600 09/12/12 0556  NA 140 140 139 141  K 2.9* 3.8 3.3* 3.7  CL 102 106 108 109  CO2 28 26 25 25   GLUCOSE 105* 103* 109* 107*  BUN 4* 3* <3* <3*  CREATININE 0.49* 0.40* 0.32* 0.37*  CALCIUM 8.6 7.8* 8.0* 8.1*  MG 1.9  --   --   --    Liver Function Tests:  Recent Labs Lab 09/09/12 2329 09/11/12 0600  AST 22 15  ALT 21 14  ALKPHOS 127* 103  BILITOT 0.2* 0.2*  PROT 6.5 5.4*  ALBUMIN 3.1* 2.4*    Recent Labs Lab 09/10/12  LIPASE 31   No results found for this basename: AMMONIA,  in the last 168 hours CBC:  Recent Labs Lab 09/06/12 1924 09/09/12 2329 09/11/12 0600 09/12/12 0556  WBC 7.2 8.7 7.4 4.8  NEUTROABS 5.5 6.4  --   --   HGB 13.5 13.2 10.9* 11.3*  HCT 38.2 38.2 32.3* 33.9*  MCV 94.6 94.3 95.0 95.8  PLT 299 277 255 279   Cardiac Enzymes: No results  found for this basename: CKTOTAL, CKMB, CKMBINDEX, TROPONINI,  in the last 168 hours BNP: BNP (last 3 results) No results found for this basename: PROBNP,  in the last 8760 hours CBG: No results found for this basename: GLUCAP,  in the last 168 hours     Signed:  Gwenyth Bender  [facility] 09/13/2012, 8:32 AM

## 2012-09-12 NOTE — Progress Notes (Signed)
UR Chart Review Completed  

## 2012-09-12 NOTE — Progress Notes (Signed)
ANTIBIOTIC CONSULT NOTE   Pharmacy Consult for Vancomycin Indication: facial cellulitis  Allergies  Allergen Reactions  . Doxycycline Other (See Comments)    headaches  . Sulfa Antibiotics Nausea And Vomiting and Other (See Comments)    Blurred vision and headaches   Patient Measurements: Height: 5\' 1"  (154.9 cm) Weight: 107 lb 3.2 oz (48.626 kg) IBW/kg (Calculated) : 47.8  Vital Signs: Temp: 97.9 F (36.6 C) (04/02 0559) Temp src: Oral (04/02 0559) BP: 122/82 mmHg (04/02 0559) Pulse Rate: 91 (04/02 0559) Intake/Output from previous day: 04/01 0701 - 04/02 0700 In: 1678.7 [P.O.:960; I.V.:618.7; IV Piggyback:100] Out: 950 [Urine:950] Intake/Output from this shift:    Labs:  Recent Labs  09/09/12 2329 09/10/12 1034 09/11/12 0600 09/12/12 0556  WBC 8.7  --  7.4 4.8  HGB 13.2  --  10.9* 11.3*  PLT 277  --  255 279  CREATININE 0.49* 0.40* 0.32* 0.37*   Estimated Creatinine Clearance: 69.8 ml/min (by C-G formula based on Cr of 0.37).  Recent Labs  09/12/12 0854  VANCOTROUGH 5.0*    Medical History: Past Medical History  Diagnosis Date  . GERD (gastroesophageal reflux disease)   . Bacterial vaginosis   . Anxiety   . Depression   . Panic attacks   . Psoriasis    Medications:  Scheduled:  . antiseptic oral rinse  15 mL Mouth Rinse q12n4p  . cefTRIAXone (ROCEPHIN)  IV  1 g Intravenous Q24H  . chlorhexidine  15 mL Mouth Rinse BID  . feeding supplement  1 Container Oral BID BM  . LORazepam  1 mg Oral TID  . nicotine  14 mg Transdermal Daily  . pantoprazole  40 mg Oral BID AC  . [COMPLETED] potassium chloride  40 mEq Oral Q4H  . vancomycin  500 mg Intravenous Q12H   Assessment: 42yo female admitted with cellulitis on face after c/o insect bite. Pt has good renal fxn.  Estimated Creatinine Clearance: 69.8 ml/min (by C-G formula based on Cr of 0.37).  Pt weight reported as 48Kg.  Trough level is below goal.  Vancomycin clearance is much better than  predicted.  Goal of Therapy:  Vancomycin trough level 10-15 mcg/ml  Plan: Increase Vancomycin to 750mg  IV q12hrs Check trough weekly & SCr twice weekly Monitor labs, renal fxn, and cultures per protocol Duration of therapy per MD  Valrie Hart A 09/12/2012,10:30 AM

## 2012-09-13 NOTE — Discharge Summary (Addendum)
Patient seen and examined. See my progress note dated 4/2. I concur with discharge.  Brendia Sacks, MD [facility] [phone]

## 2012-09-14 ENCOUNTER — Telehealth: Payer: Self-pay | Admitting: Gastroenterology

## 2012-09-14 DIAGNOSIS — K862 Cyst of pancreas: Secondary | ICD-10-CM

## 2012-09-14 NOTE — Telephone Encounter (Signed)
Pt was told to follow up with Dr Christella Hartigan and would need an EUS.  I advised the pt to call her pcp and have them do a referral and we would have Dr Christella Hartigan review records  Pt agreed

## 2012-09-17 ENCOUNTER — Other Ambulatory Visit: Payer: Self-pay

## 2012-09-17 ENCOUNTER — Telehealth: Payer: Self-pay | Admitting: Gastroenterology

## 2012-09-17 DIAGNOSIS — K862 Cyst of pancreas: Secondary | ICD-10-CM

## 2012-09-17 NOTE — Addendum Note (Signed)
Addended by: Donata Duff on: 09/17/2012 10:59 AM   Modules accepted: Orders

## 2012-09-17 NOTE — Telephone Encounter (Signed)
Patty, She needs uppper EUS, radial +/- linear, 60 min, ++ mac, next available EUS Thursday for pancreatic cyst

## 2012-09-17 NOTE — Telephone Encounter (Signed)
FYI Dr Christella Hartigan please review

## 2012-09-17 NOTE — Telephone Encounter (Signed)
Left message on machine to call back  

## 2012-09-17 NOTE — Telephone Encounter (Signed)
Pt has been scheduled needs to be instructed

## 2012-09-17 NOTE — Telephone Encounter (Signed)
Pt has been instructed and meds reviewed she will call with any questions or concerns information packet was also mailed to the home

## 2012-09-17 NOTE — Telephone Encounter (Signed)
Patient recently discharged. Needs hospital follow-up with [facility] for possible EGD+/-ED. She will be having an EUS in future with Dr. Christella Hartigan. Perhaps 4-6 weeks f/u?

## 2012-09-17 NOTE — Telephone Encounter (Signed)
Patty, I'm sorry, we actually saw this nice lady in the hospital. We would like to refer her to Dr. Christella Hartigan for an EUS. At admission, CT showed 2.2 X 2.7 cystic mass in body of the pancreas, mild pancreatic and bile duct dilation. Normal lipase.  Thanks for the help!

## 2012-09-18 ENCOUNTER — Encounter: Payer: Self-pay | Admitting: General Practice

## 2012-09-18 NOTE — Telephone Encounter (Signed)
PT SCHEDULED TO SEE DR FIELDS 6/4@11AM   LETTER MAILED

## 2012-09-21 ENCOUNTER — Telehealth: Payer: Self-pay | Admitting: Gastroenterology

## 2012-09-21 NOTE — Telephone Encounter (Signed)
Pt called this morning. She was recently [facility] from the hospital and has a FU OV with [facility] on 6/4 at 11 and is also scheduled for an EUS with Dr Christella Hartigan on May 1st. She is asking for [place] to write her a prescription for pain pills because what she has ( doesn't know the name) isn't helping her and she is up all night crying with pain. Please advise [phone]

## 2012-09-21 NOTE — Telephone Encounter (Signed)
Called and spoke to the pt. She said she is having constant abdominal pain in the middle of her stomach and left side of her back. Said she needs something for pain until her appt. The pain medicine that she got from the hospital didn't even touch her pain. I told her Dr. Evelina Nguyen does not prescribe narcotics for abd pain but I will send her the message and see if she has any recommendations.

## 2012-09-24 ENCOUNTER — Encounter (HOSPITAL_COMMUNITY): Payer: Self-pay | Admitting: *Deleted

## 2012-09-24 NOTE — Telephone Encounter (Signed)
Was this addressed already?

## 2012-09-25 NOTE — Telephone Encounter (Signed)
Called and informed pt. She does not want referral to pain clinic, her PCP at [facility] gave her a prescription for Hydrocodone.

## 2012-09-25 NOTE — Telephone Encounter (Signed)
REVIEWED.  

## 2012-09-25 NOTE — Telephone Encounter (Signed)
PLEASE CALL PT.  We do not prescribe narcotics for chronic abd pain which is most likely due to IBS. We would be more than happy to refer her to pain clinic for management.

## 2012-09-25 NOTE — Telephone Encounter (Signed)
Tobi Bastos, sorry, I was off yesterday. But I sent this to Dr. Darrick Penna on 09/21/2012.

## 2012-10-11 ENCOUNTER — Ambulatory Visit (HOSPITAL_COMMUNITY)
Admission: RE | Admit: 2012-10-11 | Discharge: 2012-10-11 | Disposition: A | Discharge status: Home / Self Care | Payer: Self-pay | Source: Ambulatory Visit | Attending: Gastroenterology | Admitting: Gastroenterology

## 2012-10-11 ENCOUNTER — Encounter (HOSPITAL_COMMUNITY): Payer: Self-pay | Admitting: *Deleted

## 2012-10-11 ENCOUNTER — Encounter (HOSPITAL_COMMUNITY): Payer: Self-pay | Admitting: Anesthesiology

## 2012-10-11 ENCOUNTER — Ambulatory Visit (HOSPITAL_COMMUNITY): Payer: Self-pay | Admitting: Anesthesiology

## 2012-10-11 ENCOUNTER — Encounter (HOSPITAL_COMMUNITY)
Admission: RE | Disposition: A | Discharge status: Home / Self Care | Payer: Self-pay | Source: Ambulatory Visit | Attending: Gastroenterology

## 2012-10-11 DIAGNOSIS — R1310 Dysphagia, unspecified: Secondary | ICD-10-CM | POA: Insufficient documentation

## 2012-10-11 DIAGNOSIS — F411 Generalized anxiety disorder: Secondary | ICD-10-CM | POA: Insufficient documentation

## 2012-10-11 DIAGNOSIS — R6881 Early satiety: Secondary | ICD-10-CM | POA: Insufficient documentation

## 2012-10-11 DIAGNOSIS — K863 Pseudocyst of pancreas: Secondary | ICD-10-CM

## 2012-10-11 DIAGNOSIS — K862 Cyst of pancreas: Secondary | ICD-10-CM | POA: Insufficient documentation

## 2012-10-11 DIAGNOSIS — F3289 Other specified depressive episodes: Secondary | ICD-10-CM | POA: Insufficient documentation

## 2012-10-11 DIAGNOSIS — F1011 Alcohol abuse, in remission: Secondary | ICD-10-CM | POA: Insufficient documentation

## 2012-10-11 DIAGNOSIS — K589 Irritable bowel syndrome without diarrhea: Secondary | ICD-10-CM | POA: Insufficient documentation

## 2012-10-11 DIAGNOSIS — F329 Major depressive disorder, single episode, unspecified: Secondary | ICD-10-CM | POA: Insufficient documentation

## 2012-10-11 DIAGNOSIS — F1210 Cannabis abuse, uncomplicated: Secondary | ICD-10-CM | POA: Insufficient documentation

## 2012-10-11 HISTORY — PX: EUS: SHX5427

## 2012-10-11 LAB — PANC CYST FLD ANLYS-PATHFNDR-TG

## 2012-10-11 SURGERY — UPPER ENDOSCOPIC ULTRASOUND (EUS) LINEAR
Anesthesia: Monitor Anesthesia Care

## 2012-10-11 MED ORDER — CIPROFLOXACIN HCL 500 MG PO TABS: 500.0000 mg | ORAL_TABLET | Freq: Two times a day (BID) | ORAL | Status: DC

## 2012-10-11 MED ORDER — SODIUM CHLORIDE 0.9 % IV SOLN: INTRAVENOUS | Status: DC

## 2012-10-11 MED ORDER — CIPROFLOXACIN IN D5W 400 MG/200ML IV SOLN
INTRAVENOUS | Status: DC | PRN
  Administered 2012-10-11: 400 mg via INTRAVENOUS

## 2012-10-11 MED ORDER — PROPOFOL 10 MG/ML IV EMUL
INTRAVENOUS | Status: DC | PRN
  Administered 2012-10-11: 90 mg via INTRAVENOUS

## 2012-10-11 MED ORDER — CIPROFLOXACIN IN D5W 400 MG/200ML IV SOLN
INTRAVENOUS | Status: AC
  Filled 2012-10-11: qty 200

## 2012-10-11 MED ORDER — PROMETHAZINE HCL 25 MG/ML IJ SOLN: 6.2500 mg | INTRAMUSCULAR | Status: DC | PRN

## 2012-10-11 MED ORDER — LIDOCAINE HCL (CARDIAC) 20 MG/ML IV SOLN
INTRAVENOUS | Status: DC | PRN
  Administered 2012-10-11: 30 mg via INTRAVENOUS

## 2012-10-11 MED ORDER — MIDAZOLAM HCL 5 MG/5ML IJ SOLN
INTRAMUSCULAR | Status: DC | PRN
  Administered 2012-10-11: 2 mg via INTRAVENOUS

## 2012-10-11 MED ORDER — LACTATED RINGERS IV SOLN
INTRAVENOUS | Status: DC
  Administered 2012-10-11: 10:00:00 via INTRAVENOUS

## 2012-10-11 MED ORDER — PROPOFOL INFUSION 10 MG/ML OPTIME
INTRAVENOUS | Status: DC | PRN
  Administered 2012-10-11: 100 ug/kg/min via INTRAVENOUS

## 2012-10-11 MED ORDER — LACTATED RINGERS IV SOLN
INTRAVENOUS | Status: DC
  Administered 2012-10-11: 1000 mL via INTRAVENOUS

## 2012-10-11 MED ORDER — FENTANYL CITRATE 0.05 MG/ML IJ SOLN
INTRAMUSCULAR | Status: DC | PRN
  Administered 2012-10-11: 100 ug via INTRAVENOUS

## 2012-10-11 MED ORDER — ONDANSETRON HCL 4 MG/2ML IJ SOLN
INTRAMUSCULAR | Status: DC | PRN
  Administered 2012-10-11: 4 mg via INTRAVENOUS

## 2012-10-11 NOTE — Op Note (Addendum)
[facility] [address] [place], [address]   ENDOSCOPIC ULTRASOUND PROCEDURE REPORT  PATIENT: Monique Nguyen, Monique Nguyen.  MR#: [id number] BIRTHDATE: 09/14/1970  GENDER: Female ENDOSCOPIST: Rachael Fee, MD REFERRED BY:  Kassie Mends; MD; Augusto Gamble, MD PROCEDURE DATE:  10/11/2012 PROCEDURE:   Upper EUS w/FNA ASA CLASS:      Class II INDICATIONS:   previous Etoh abuse, quit 3 months ago; abdominal pain led to CT scan which showed cyst in body of pancreas; no weight loss. MEDICATIONS: MAC sedation, administered by CRNA and Cipro 400 mg IV   DESCRIPTION OF PROCEDURE:   After the risks benefits and alternatives of the procedure were  explained, informed consent was obtained. The patient was then placed in the left, lateral, decubitus postion and IV sedation was administered. Throughout the procedure, the patients blood pressure, pulse and oxygen saturations were monitored continuously.  Under direct visualization, the Pentax Radial EUS [id number]  endoscope was introduced through the mouth  and advanced to the second portion of the duodenum .  Water was used as necessary to provide an acoustic interface.  Upon completion of the imaging, water was removed and the patient was sent to the recovery room in satisfactory condition.  Endoscopic findings: 1. Normal UGI tract EUS findings: 1. Pancreatic parenchyma was diffusely abnormal; more hypoechoic than usual. There were no solid masses but there was a 3.7cm cystic lesion in body of pancreas that contained large amount of isoechoic debris.  The cyst was aspirated with a single transgastric pass of a 19 gauge EUS FNA needle. This yielded 15cc of reddish, milky fluid which was sent for gram stain, culture, sensitivity, CEA, amylase, cytology. 2. Main pancreatic duct was normal in head, appeared normal in body. I could not tell if the duct communicated with the cystic lesion. 3. CBD was normal, non-dilated 4. No  peripancreatic adenopathy. 5. Gallbladder was normal, no gallstones 6. Limited views of liver, spleen, portal and splenic vessels were all normal Impression: 3.7cm cyst in body of pancreas that contained large amount of internal debris but no clear associated solid masses or tumors. 15cc of reddish, milky fluid was aspirated from the cyst and sent for testing. Given her previous alcohol overuse, I suspect this cyst is a pseudocyst. Await final testing.  She will complete 3 days of bid cipro for now.    _______________________________ eSigned:  Rachael Fee, MD 10/11/2012 11:27 AM Revised: 10/11/2012 11:27 AM

## 2012-10-11 NOTE — Anesthesia Preprocedure Evaluation (Addendum)
Anesthesia Evaluation  Patient identified by MRN, date of birth, ID band Patient awake    Reviewed: Allergy & Precautions, H&P , NPO status , Patient's Chart, lab work & pertinent test results  Airway Mallampati: II TM Distance: >3 FB Neck ROM: Full    Dental  (+) Teeth Intact, Poor Dentition and Partial Upper   Pulmonary  breath sounds clear to auscultation  Pulmonary exam normal       Cardiovascular negative cardio ROS  Rhythm:Regular Rate:Normal     Neuro/Psych Anxiety Depression negative neurological ROS     GI/Hepatic GERD-  ,(+)     substance abuse  alcohol use and marijuana use, Pancreatic cyst   Endo/Other  negative endocrine ROS  Renal/GU negative Renal ROS  negative genitourinary   Musculoskeletal negative musculoskeletal ROS (+)   Abdominal   Peds negative pediatric ROS (+)  Hematology negative hematology ROS (+)   Anesthesia Other Findings   Reproductive/Obstetrics negative OB ROS                          Anesthesia Physical Anesthesia Plan  ASA: II  Anesthesia Plan: MAC   Post-op Pain Management:    Induction: Intravenous  Airway Management Planned: Nasal Cannula  Additional Equipment:   Intra-op Plan:   Post-operative Plan:   Informed Consent: I have reviewed the patients History and Physical, chart, labs and discussed the procedure including the risks, benefits and alternatives for the proposed anesthesia with the patient or authorized representative who has indicated his/her understanding and acceptance.     Plan Discussed with: CRNA  Anesthesia Plan Comments:         Anesthesia Quick Evaluation

## 2012-10-11 NOTE — OR Nursing (Signed)
Patient very inappropriate to staff members, making derogatory comments, very anxious and wanting to leave as soon as possible regardless of care that needed to be provided. Recovery time cut short due to uncooperativeness. Discharge information provided and reviewed with boyfriend at bedside. Wheeled out via w/c with staff member.

## 2012-10-11 NOTE — Transfer of Care (Signed)
Immediate Anesthesia Transfer of Care Note  Patient: Monique Nguyen  Procedure(s) Performed: Procedure(s): UPPER ENDOSCOPIC ULTRASOUND (EUS) LINEAR (N/A)  Patient Location: PACU  Anesthesia Type:MAC  Level of Consciousness: awake, alert , oriented and patient cooperative  Airway & Oxygen Therapy   O2 per nasal cannula  Post-op Assessment: Report given to PACU RN, Post -op Vital signs reviewed and stable and Patient moving all extremities X 4  Post vital signs: stable  Complications: No apparent anesthesia complications

## 2012-10-11 NOTE — Interval H&P Note (Signed)
History and Physical Interval Note:  10/11/2012 9:48 AM  Monique Nguyen  has presented today for surgery, with the diagnosis of Pancreatic cyst [577.2]  The various methods of treatment have been discussed with the patient and family. After consideration of risks, benefits and other options for treatment, the patient has consented to  Procedure(s): UPPER ENDOSCOPIC ULTRASOUND (EUS) LINEAR (N/A) as a surgical intervention .  The patient's history has been reviewed, patient examined, no change in status, stable for surgery.  I have reviewed the patient's chart and labs.  Questions were answered to the patient's satisfaction.     Rob Bunting

## 2012-10-11 NOTE — H&P (View-Only) (Signed)
Subjective: Denies abdominal pain, N/V. Tolerating diet, just doesn't like "hospital food". No diarrhea.   Objective: Vital signs in last 24 hours: Temp:  [97.8 F (36.6 C)-98.8 F (37.1 C)] 98.2 F (36.8 C) (04/01 0717) Pulse Rate:  [101-111] 102 (04/01 0823) Resp:  [19-20] 19 (04/01 0717) BP: (102-121)/(62-84) 119/73 mmHg (04/01 0717) SpO2:  [98 %] 98 % (04/01 0823) Last BM Date: 09/09/12 General:   Alert and oriented, pleasant Head:  Normocephalic and atraumatic. Eyes:  Improved periorbital cellulitis Mouth:  Without lesions, mucosa pink and moist.  Heart:  S1, S2 present, no murmurs noted.  Lungs: Clear to auscultation bilaterally, without wheezing, rales, or rhonchi.  Abdomen:  Bowel sounds present, soft, non-tender, non-distended. No HSM or hernias noted. No rebound or guarding. No masses appreciated  Msk:  Symmetrical without gross deformities. Normal posture. Extremities:  Without clubbing or edema. Neurologic:  Alert and  oriented x4;  grossly normal neurologically. Skin:  Left forehead lesion, small amount of purulent drainage.  Psych:  Alert and cooperative. Normal mood and affect.  Intake/Output from previous day: 03/31 0701 - 04/01 0700 In: 2640 [I.V.:2390; IV Piggyback:250] Out: 400 [Urine:400] Intake/Output this shift:    Lab Results:  Recent Labs  09/09/12 2329 09/11/12 0600  WBC 8.7 7.4  HGB 13.2 10.9*  HCT 38.2 32.3*  PLT 277 255   BMET  Recent Labs  09/09/12 2329 09/10/12 1034 09/11/12 0600  NA 140 140 139  K 2.9* 3.8 3.3*  CL 102 106 108  CO2 28 26 25  GLUCOSE 105* 103* 109*  BUN 4* 3* <3*  CREATININE 0.49* 0.40* 0.32*  CALCIUM 8.6 7.8* 8.0*   LFT  Recent Labs  09/09/12 2329 09/11/12 0600  PROT 6.5 5.4*  ALBUMIN 3.1* 2.4*  AST 22 15  ALT 21 14  ALKPHOS 127* 103  BILITOT 0.2* 0.2*  BILIDIR <0.1  --   IBILI NOT CALCULATED  --      Studies/Results: Ct Abdomen Pelvis W Contrast  09/10/2012  *RADIOLOGY REPORT*  Clinical  Data: Right greater than left abdominal pain.  CT ABDOMEN AND PELVIS WITH CONTRAST  Technique:  Multidetector CT imaging of the abdomen and pelvis was performed following the standard protocol during bolus administration of intravenous contrast.  Contrast: 50mL OMNIPAQUE IOHEXOL 300 MG/ML  SOLN, 100mL OMNIPAQUE IOHEXOL 300 MG/ML  SOLN  Comparison: 08/01/2008  Findings: Mild dependent atelectasis in the lung bases.  There is a circumscribed cystic appearing lesion in the body of the pancreas measuring about 2.2 x 2.7 cm.  There is infiltration in the fat around the body and tail of the pancreas.  Mild pancreatic and bile duct dilatation.  Changes are most likely to represent pancreatitis with pseudocyst or wall off necrosis but a cystic mass is not excluded.  Correlation with clinical and laboratory evidence of pancreatitis is recommended.  If no evidence of pancreatitis, then elective MRI / MRCP is recommended for further evaluation of the lesion.  The liver, spleen, gallbladder, adrenal glands, kidneys, abdominal aorta, inferior vena cava, and retroperitoneal lymph nodes are unremarkable.  No free air or free fluid in the abdomen.  The stomach, small bowel, and colon are not abnormally distended.  Pelvis:  The uterus and ovaries are not enlarged.  Small amount of free fluid in the pelvis is likely to be physiologic.  The bladder wall is not thickened.  The appendix is not visualized.  No evidence of diverticulitis.  No significant pelvic lymphadenopathy. Normal alignment of the lumbar   vertebrae.  IMPRESSION: 2.2 x 2.7 cm cystic mass in the body of the pancreas with associated inflammatory stranding around the body and tail the pancreas and mild pancreatic and bile duct dilatation.  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  Correlation with clinical and laboratory evidence of pancreatitis is recommended.  Small amount of free fluid in the pelvis is likely physiologic.   Original Report  Authenticated By: William Stevens, M.D.    Ct Maxillofacial W/cm  09/10/2012  *RADIOLOGY REPORT*  Clinical Data: Left facial swelling 1 week after spider bite.  CT MAXILLOFACIAL WITH CONTRAST  Technique:  Multidetector CT imaging of the maxillofacial structures was performed with intravenous contrast. Multiplanar CT image reconstructions were also generated.  Contrast:  50 ml Omnipaque-300  Comparison: None.  Findings: There is diffuse soft tissue swelling over the left supraorbital, periorbital, and anterior maxillary region.  There is contrast opacification of the edematous soft tissues.  This is consistent with inflammatory process such as cellulitis.  There is no loculated fluid collection to suggest an abscess.  The globes and extraocular muscles appear intact and symmetrical.  No preseptal or retrobulbar involvement.  The paranasal sinuses are clear.  Visualized bones appear intact.  No displaced orbital or facial fractures are identified.  There are prominent lymph nodes throughout the cervical regions bilaterally and in the angle of the mandible and submental spaces.  These are not pathologically enlarged and likely reactive.  IMPRESSION: Diffuse soft tissue swelling and enhancement over the left side of the face and supraorbital region most consistent with cellulitis. No discrete abscess is identified.  Diffuse prominence of cervical lymph nodes is likely reactive.   Original Report Authenticated By: William Stevens, M.D.     Assessment: 42-year-old female with history of chronic abdominal pain, IBS, admitted with facial cellulitis and noted to have a 2.2 X 2.7 cystic mass in body of the pancreas, mild pancreatic and bile duct dilation. Normal lipase, mildly isolated elevation of alk phos at 127 on admission now normalized. Patient is a poor historian, and her lower abdominal discomfort may be secondary to IBS +/- GYN issues. Clinically improved from a GI standpoint. Will follow peripherally and plan  on outpatient EUS with Dr. Jacobs for further evaluation of pancreatic mass.    Also noted early satiety, vague dysphagia symptoms on admission. Further work-up with EGD +/- dilation with Dr. Fields as outpatient. Will arrange outpatient follow-up with our office.    Plan: EUS as outpatient BID PPI Outpatiet follow-up for IBS, consideration of EGD/dilation Follow peripherally    LOS: 2 days    09/11/2012, 12:24 PM   Seen this afternoon. She continues to deny abdominal pain. She tolerated  lunch without any difficulties. Her abdomen is soft and nontender. Agree with above impression and recommendations as outlined.  

## 2012-10-11 NOTE — Anesthesia Postprocedure Evaluation (Signed)
Anesthesia Post Note  Patient: Monique Nguyen  Procedure(s) Performed: Procedure(s) (LRB): UPPER ENDOSCOPIC ULTRASOUND (EUS) LINEAR (N/A)  Anesthesia type: MAC  Patient location: PACU  Post pain: Pain level controlled  Post assessment: Post-op Vital signs reviewed  Last Vitals:  Filed Vitals:   10/11/12 1126  BP: 109/69  Temp: 36.6 C  Resp: 11    Post vital signs: Reviewed  Level of consciousness: sedated  Complications: No apparent anesthesia complications

## 2012-10-12 ENCOUNTER — Encounter (HOSPITAL_COMMUNITY): Payer: Self-pay | Admitting: Gastroenterology

## 2012-10-14 LAB — CULTURE, ROUTINE-ABSCESS: Culture: NO GROWTH

## 2012-10-17 ENCOUNTER — Telehealth: Payer: Self-pay | Admitting: Gastroenterology

## 2012-10-17 NOTE — Telephone Encounter (Signed)
Pt is aware she can use Tylenol for the pain. She has an appointment Thursday with [facility]

## 2012-10-17 NOTE — Telephone Encounter (Signed)
Pt called to see if she could get in any sooner to see [facility]. She had OV scheduled for 6/4 at 11 with [facility] and I moved it up to 5/22 at 11 with [facility] and patient is asking if we will call in something for pain and she is still hurting and why can't we see her sooner than 5/22. I told her that if I have a cancellation before then that I would call her, but I would need the nurse to advise about medications. Please call her back at [phone]

## 2012-10-17 NOTE — Telephone Encounter (Signed)
See result note on 10/16/12

## 2012-10-17 NOTE — Telephone Encounter (Signed)
PLEASE CALL PT. SHE HAD THE PSEUDOCYST DRAINED BY DR. Christella Hartigan ON MAY 1. SHE SHOULD USE TYLENOL AS NEEDED FOR PAIN. SHE SHOULD FOLLOW A LOW FAT DIET AND AVOID ETOH. SHE SHOULD STOP SMOKING WHICH ALSO IRRITATES HER PANCREAS. OPV MAY 22 OR SOONER IF WE HAVE A CANCELLATION.

## 2012-10-17 NOTE — Telephone Encounter (Signed)
Called and spoke to pt. She said she is having abdominal pain and back pain. Said she has a pseudocyst on her pancreas. Please advise!

## 2012-10-18 ENCOUNTER — Other Ambulatory Visit: Payer: Self-pay | Admitting: Gastroenterology

## 2012-10-18 ENCOUNTER — Ambulatory Visit (INDEPENDENT_AMBULATORY_CARE_PROVIDER_SITE_OTHER): Payer: Self-pay | Admitting: Gastroenterology

## 2012-10-18 ENCOUNTER — Ambulatory Visit (HOSPITAL_COMMUNITY)
Admission: RE | Admit: 2012-10-18 | Discharge: 2012-10-18 | Disposition: A | Discharge status: Home / Self Care | Payer: Self-pay | Source: Ambulatory Visit | Attending: Gastroenterology | Admitting: Gastroenterology

## 2012-10-18 ENCOUNTER — Encounter: Payer: Self-pay | Admitting: Gastroenterology

## 2012-10-18 VITALS — BP 99/69 | HR 106 | Temp 98.2°F | Ht 61.0 in | Wt 104.0 lb

## 2012-10-18 DIAGNOSIS — K869 Disease of pancreas, unspecified: Secondary | ICD-10-CM | POA: Insufficient documentation

## 2012-10-18 DIAGNOSIS — R1013 Epigastric pain: Secondary | ICD-10-CM | POA: Insufficient documentation

## 2012-10-18 DIAGNOSIS — R109 Unspecified abdominal pain: Secondary | ICD-10-CM

## 2012-10-18 DIAGNOSIS — R1310 Dysphagia, unspecified: Secondary | ICD-10-CM

## 2012-10-18 DIAGNOSIS — K7689 Other specified diseases of liver: Secondary | ICD-10-CM | POA: Insufficient documentation

## 2012-10-18 LAB — HEPATIC FUNCTION PANEL
ALT: 26 U/L (ref 0–35)
AST: 33 U/L (ref 0–37)
Bilirubin, Direct: 0.1 mg/dL (ref 0.0–0.3)
Indirect Bilirubin: 0.3 mg/dL (ref 0.0–0.9)
Total Protein: 7.1 g/dL (ref 6.0–8.3)

## 2012-10-18 MED ORDER — IOHEXOL 300 MG/ML  SOLN
100.0000 mL | Freq: Once | INTRAMUSCULAR | Status: AC | PRN
  Administered 2012-10-18: 100 mL via INTRAVENOUS

## 2012-10-18 MED ORDER — PANCRELIPASE (LIP-PROT-AMYL) 24000-76000 UNITS PO CPEP: ORAL_CAPSULE | ORAL | Status: DC

## 2012-10-18 MED ORDER — PROMETHAZINE HCL 25 MG PO TABS: 25.0000 mg | ORAL_TABLET | Freq: Four times a day (QID) | ORAL | Status: DC | PRN

## 2012-10-18 NOTE — Progress Notes (Signed)
  Subjective:    Patient ID: Monique Nguyen, female    DOB: 02-08-1971, 42 y.o.   MRN: [id number]  PCP: [facility]  HPI HAVING PAIN IN ABDOMEN. DOESN'T EAT A WHOLE LOT DUE TO VOMITING. NOT ABLE TO EAT GOOD.BMs: NL, EVERY OTHER DAY. LAST ETOH: 4 MOS. AGO. CIGS: 1 PK/DAY. VOMITING: NONE   DRY HEAVES DAY BEFORE YESTERDAY. NAUSEA: ALL THE TIME. USING PHENRGAN AS NEEDED: 1-2 X/DAY. FEELS LIKE FooD GETS STUCK IN HER THROAT. SUBJECTIVE CHILLS. RARE "DIARRHEA DUE TO HER IBS". HEARTBURN/INDIGESTION EVERY DAY BUT PROTONIX MAKES IT GO AWAY. Not using nicoderm patch.  PT DENIES FEVER, CHILLS, BRBPR, melena, diarrhea, constipation,  problems with sedation, heartburn or indigestion. LMP: APR 2014  Past Medical History  Diagnosis Date  . GERD (gastroesophageal reflux disease)   . Bacterial vaginosis   . Anxiety   . Depression   . Panic attacks   . Psoriasis    Past Surgical History  Procedure Laterality Date  . Bladder surgery      X 3  . Eus N/A 10/11/2012    Procedure: UPPER ENDOSCOPIC ULTRASOUND (EUS) LINEAR;  Surgeon: Rachael Fee, MD;  Location: [facility];  Service: Endoscopy;  Laterality: N/A;   Allergies  Allergen Reactions  . Doxycycline Other (See Comments)    headaches  . Sulfa Antibiotics Nausea And Vomiting and Other (See Comments)    Blurred vision and headaches   Current Outpatient Prescriptions  Medication Sig Dispense Refill  . citalopram (CELEXA) 40 MG tablet Take 40 mg by mouth daily.      Marland Kitchen HYDROcodone-acetaminophen (NORCO/VICODIN) 5-325 MG per tablet Take 1 tablet by mouth every 4 (four) hours as needed. 2-3/day   . LORazepam (ATIVAN) 1 MG tablet Take 1 tablet (1 mg total) by mouth 3 (three) times daily as needed for anxiety. tid   . pantoprazole (PROTONIX) 40 MG tablet Take 1 tablet (40 mg total) by mouth 2 (two) times daily before a meal.    . promethazine (PHENERGAN) 25 MG tablet Take 25 mg by mouth every 6 (six) hours as needed for nausea.    Marland Kitchen acetaminophen  (TYLENOL) 325 MG tablet Take 650 mg by mouth every 6 (six) hours as needed for pain.    .      .      .             Review of Systems     Objective:   Physical Exam  Vitals reviewed. Constitutional: She is oriented to person, place, and time. She appears well-nourished. No distress.  HENT:  Head: Normocephalic and atraumatic.  Mouth/Throat: Oropharynx is clear and moist. No oropharyngeal exudate.  Eyes: Pupils are equal, round, and reactive to light. No scleral icterus.  Neck: Normal range of motion. Neck supple.  Cardiovascular: Normal rate, regular rhythm and normal heart sounds.   Pulmonary/Chest: Effort normal and breath sounds normal. No respiratory distress.  Abdominal: Soft. Bowel sounds are normal. She exhibits no distension. There is tenderness. There is rebound (MIDL IN EPIGASTRIUM AND BLQs). There is no guarding.  MODERATE TTP IN Sabri.Binder AND EPIGASTRIUM   Musculoskeletal: Normal range of motion. She exhibits no edema.  Lymphadenopathy:    She has no cervical adenopathy.  Neurological: She is alert and oriented to person, place, and time.  NO  NEW FOCAL DEFICITS   Psychiatric:  SLIGHTLY ANXIOUS MOOD, NL AFFECT           Assessment & Plan:

## 2012-10-18 NOTE — Addendum Note (Signed)
Addended by: Tonye Pearson on: 10/18/2012 11:13 AM   Modules accepted: Orders

## 2012-10-18 NOTE — Assessment & Plan Note (Signed)
DUE TO ?CHRONIC PANCREATITIS, IBS, AND ABD WALL PAIN, LESS LIKELY ABSCESS IN PT S/P DRAINAGE OF PSEUDOCYST   AVOID ETOH ENSURE OR BOOT QID PHENERGAN PRN ADD CREON CUT DOWN ON SMOKING CONTINUE CELEXA OPV IN 3 MOS

## 2012-10-18 NOTE — Patient Instructions (Signed)
CONTINUE TO AVOID ALCOHOL.  DRINK ENSURE OR BOOST FOUR TIMES A DAY.  USE PHENERGAN 30 MINS PRIOR TO MEALS THREE TIMES A DAY TO HELP WITH NAUSEA.  ADD CREON TABLETS 2 WITH MEALS AND ONE WITH SNACKS.  CUT DOWN ON SMOKING.  CONTINUE CELEXA & PROTONIX.  FOLLOW A LOW FAT DIET. SEE INFO BELOW.  COMPLETE CT SCAN TODAY AND SWALLOWING STUDY NEXT WEEK.  FOLLOW UP IN 3 MOS.    Low-Fat Diet BREADS, CEREALS, PASTA, RICE, DRIED PEAS, AND BEANS These products are high in carbohydrates and most are low in fat. Therefore, they can be increased in the diet as substitutes for fatty foods. They too, however, contain calories and should not be eaten in excess. Cereals can be eaten for snacks as well as for breakfast.   FRUITS AND VEGETABLES It is good to eat fruits and vegetables. Besides being sources of fiber, both are rich in vitamins and some minerals. They help you get the daily allowances of these nutrients. Fruits and vegetables can be used for snacks and desserts.  MEATS Limit lean meat, chicken, [place], and fish to no more than 6 ounces per day. Beef, Pork, and Lamb Use lean cuts of beef, pork, and lamb. Lean cuts include:  Extra-lean ground beef.  Arm roast.  Sirloin tip.  Center-cut ham.  Round steak.  Loin chops.  Rump roast.  Tenderloin.  Trim all fat off the outside of meats before cooking. It is not necessary to severely decrease the intake of red meat, but lean choices should be made. Lean meat is rich in protein and contains a highly absorbable form of iron. Premenopausal women, in particular, should avoid reducing lean red meat because this could increase the risk for low red blood cells (iron-deficiency anemia).  Chicken and [place] These are good sources of protein. The fat of poultry can be reduced by removing the skin and underlying fat layers before cooking. Chicken and [place] can be substituted for lean red meat in the diet. Poultry should not be fried or covered with  high-fat sauces. Fish and Shellfish Fish is a good source of protein. Shellfish contain cholesterol, but they usually are low in saturated fatty acids. The preparation of fish is important. Like chicken and [place], they should not be fried or covered with high-fat sauces. EGGS Egg whites contain no fat or cholesterol. They can be eaten often. Try 1 to 2 egg whites instead of whole eggs in recipes or use egg substitutes that do not contain yolk. MILK AND DAIRY PRODUCTS Use skim or 1% milk instead of 2% or whole milk. Decrease whole milk, natural, and processed cheeses. Use nonfat or low-fat (2%) cottage cheese or low-fat cheeses made from vegetable oils. Choose nonfat or low-fat (1 to 2%) yogurt. Experiment with evaporated skim milk in recipes that call for heavy cream. Substitute low-fat yogurt or low-fat cottage cheese for sour cream in dips and salad dressings. Have at least 2 servings of low-fat dairy products, such as 2 glasses of skim (or 1%) milk each day to help get your daily calcium intake. FATS AND OILS Reduce the total intake of fats, especially saturated fat. Butterfat, lard, and beef fats are high in saturated fat and cholesterol. These should be avoided as much as possible. Vegetable fats do not contain cholesterol, but certain vegetable fats, such as coconut oil, palm oil, and palm kernel oil are very high in saturated fats. These should be limited. These fats are often used in [employer], processed  foods, popcorn, oils, and nondairy creamers. Vegetable shortenings and some peanut butters contain hydrogenated oils, which are also saturated fats. Read the labels on these foods and check for saturated vegetable oils. Unsaturated vegetable oils and fats do not raise blood cholesterol. However, they should be limited because they are fats and are high in calories. Total fat should still be limited to 30% of your daily caloric intake. Desirable liquid vegetable oils are corn oil, cottonseed oil,  olive oil, canola oil, safflower oil, soybean oil, and sunflower oil. Peanut oil is not as good, but small amounts are acceptable. Buy a heart-healthy tub margarine that has no partially hydrogenated oils in the ingredients. Mayonnaise and salad dressings often are made from unsaturated fats, but they should also be limited because of their high calorie and fat content. Seeds, nuts, peanut butter, olives, and avocados are high in fat, but the fat is mainly the unsaturated type. These foods should be limited mainly to avoid excess calories and fat. OTHER EATING TIPS Snacks  Most sweets should be limited as snacks. They tend to be rich in calories and fats, and their caloric content outweighs their nutritional value. Some good choices in snacks are graham crackers, melba toast, soda crackers, bagels (no egg), English muffins, fruits, and vegetables. These snacks are preferable to snack crackers, [place] fries, TORTILLA CHIPS, and POTATO chips. Popcorn should be air-popped or cooked in small amounts of liquid vegetable oil. Desserts Eat fruit, low-fat yogurt, and fruit ices instead of pastries, cake, and cookies. Sherbet, angel food cake, gelatin dessert, frozen low-fat yogurt, or other frozen products that do not contain saturated fat (pure fruit juice bars, frozen ice pops) are also acceptable.  COOKING METHODS Choose those methods that use little or no fat. They include: Poaching.  Braising.  Steaming.  Grilling.  Baking.  Stir-frying.  Broiling.  Microwaving.  Foods can be cooked in a nonstick pan without added fat, or use a nonfat cooking spray in regular cookware. Limit fried foods and avoid frying in saturated fat. Add moisture to lean meats by using water, broth, cooking wines, and other nonfat or low-fat sauces along with the cooking methods mentioned above. Soups and stews should be chilled after cooking. The fat that forms on top after a few hours in the refrigerator should be skimmed off.  When preparing meals, avoid using excess salt. Salt can contribute to raising blood pressure in some people.  EATING AWAY FROM HOME Order entres, potatoes, and vegetables without sauces or butter. When meat exceeds the size of a deck of cards (3 to 4 ounces), the rest can be taken home for another meal. Choose vegetable or fruit salads and ask for low-calorie salad dressings to be served on the side. Use dressings sparingly. Limit high-fat toppings, such as bacon, crumbled eggs, cheese, sunflower seeds, and olives. Ask for heart-healthy tub margarine instead of butter.

## 2012-10-18 NOTE — Progress Notes (Signed)
Reminder in epic °

## 2012-10-18 NOTE — Progress Notes (Signed)
Cc PCP 

## 2012-10-19 LAB — LIPASE: Lipase: 35 U/L (ref 0–75)

## 2012-10-22 ENCOUNTER — Telehealth: Payer: Self-pay | Admitting: Gastroenterology

## 2012-10-22 ENCOUNTER — Other Ambulatory Visit (HOSPITAL_COMMUNITY): Payer: Self-pay

## 2012-10-22 NOTE — Telephone Encounter (Signed)
PLEASE CALL PT. HER CT SHOWS SHE STILL HAS A SMALL CYST IN HER PANCREAS. HER PANCREAS ENZYMES AND LIVER TEST ARE NORMAL SHE IS NOT PREGANT. SHE SHOULD:  1. CONTINUE TO AVOID ALCOHOL.  2. DRINK ENSURE OR BOOST FOUR TIMES A DAY.  3. USE PHENERGAN 30 MINS PRIOR TO MEALS THREE TIMES A DAY TO HELP WITH NAUSEA.  4. USE  CREON TABLETS 2 WITH MEALS AND ONE WITH SNACKS.  5. CUT DOWN ON SMOKING & SHE SHOULD EVENTUALLY QUIT ALL TOGETHER. 6. CONTINUE CELEXA & PROTONIX.  7. FOLLOW A LOW FAT DIET.  8. FOLLOW UP IN 3 MOS E 30 CHRONIC PANCREATITIS.

## 2012-10-22 NOTE — Telephone Encounter (Signed)
Cc PCP 

## 2012-10-22 NOTE — Telephone Encounter (Signed)
Called, many rings. No answer and could not leave a message. Mailing a letter to call for results.

## 2012-10-23 ENCOUNTER — Other Ambulatory Visit (HOSPITAL_COMMUNITY): Payer: Self-pay

## 2012-10-23 NOTE — Telephone Encounter (Signed)
Reminder in epic °

## 2012-10-24 NOTE — Telephone Encounter (Signed)
Pt returned call and was informed. She said the Creon was too expensive and she could not afford it. Please advise!

## 2012-10-25 NOTE — Telephone Encounter (Signed)
Is she uninsured? If so, let's get drug company involved and see if patient assistance program. May sample. If cannot get her on Creon, we can try [employer] rep.

## 2012-10-26 NOTE — Telephone Encounter (Signed)
PLEASE CALL PT.  Pt should apply for pt assistance for Creon.

## 2012-10-26 NOTE — Telephone Encounter (Signed)
I called Pt and she does not have insurance. I told her that I would be working on getting her help with it.

## 2012-10-29 NOTE — Telephone Encounter (Signed)
Per Ginger she is working on this.

## 2012-10-30 NOTE — Telephone Encounter (Signed)
Tried to call with no answer  

## 2012-11-01 ENCOUNTER — Ambulatory Visit: Payer: Self-pay | Admitting: Gastroenterology

## 2012-11-01 NOTE — Telephone Encounter (Signed)
Late entry: 10/31/12  I mailed out the forms for her to fill out. Sh will send them back asap.

## 2012-11-02 ENCOUNTER — Telehealth: Payer: Self-pay

## 2012-11-02 MED ORDER — PROMETHAZINE HCL 25 MG PO TABS: ORAL_TABLET | ORAL | Status: DC

## 2012-11-02 MED ORDER — ONDANSETRON 4 MG PO TBDP: ORAL_TABLET | ORAL | Status: DC

## 2012-11-02 NOTE — Telephone Encounter (Signed)
Called pt. Got VM again. Left the detailed message since we will be leaving office at noon.

## 2012-11-02 NOTE — Telephone Encounter (Signed)
LMOM to call.

## 2012-11-02 NOTE — Telephone Encounter (Signed)
PLEASE CALL PT.  SHE HAS TWO OPTIONS: 1. USE PHENERGAN 30 MINS PRIOR TO MEALS THREE TIMES A DAY AND AT BEDTIME AND ADD ZOFRAN WITH THE PHENERGAN TO TRY TO TREAT THE NAUSEA AND VOMITING AND IF SHE CAN'T KEEP ANYTHING DOWN GO TO THE ED OR 2. SHE MAY GO TO THE ED TODAY FOR AN EVALUATION. SHE NEEDS THE PANCREATIC ENZYMES WHICH WILL HELP WITH PAIN. SHE SHOULD STOP SMOKING.  SHE SWALLOWS THE PHENERGAN AND PUTS THE ZOFRAN UNDER HER TONGUE.

## 2012-11-02 NOTE — Telephone Encounter (Signed)
Pt called and said she is having nausea/vomiting when she tries to eat. Has had mostly fluids for the last couple of days. Phenergan not helping. She said she is having burning in her abdomen and pancreas. Please advise!

## 2012-11-07 NOTE — Telephone Encounter (Signed)
Tried to call to see if she got the paper work but no answer

## 2012-11-12 ENCOUNTER — Telehealth: Payer: Self-pay

## 2012-11-12 NOTE — Telephone Encounter (Signed)
Pt called and said she thinks she is having a side effect from the Zofran. She has been taking it a little over a week, but the last 3 days she has had SOB that lasts most of the day. She sound like she was short of breath, although she said that it was no worse than it has been for the last 3 days. I told her she should go to the ED for the shortness of breath, since we cannot assess her over the phone.

## 2012-11-12 NOTE — Telephone Encounter (Signed)
Completely agree.

## 2012-11-13 ENCOUNTER — Encounter: Payer: Self-pay | Admitting: Gastroenterology

## 2012-11-13 NOTE — Telephone Encounter (Addendum)
REVIEWED. NEED REPEAT CT ABD PANCREATIC PROTOCOL IN AUG 2014.

## 2012-11-14 ENCOUNTER — Ambulatory Visit: Payer: Self-pay | Admitting: Gastroenterology

## 2012-11-14 NOTE — Telephone Encounter (Signed)
Routing to Soledad Gerlach to nic.

## 2012-11-14 NOTE — Telephone Encounter (Signed)
Done

## 2012-12-19 ENCOUNTER — Telehealth: Payer: Self-pay | Admitting: Gastroenterology

## 2012-12-19 NOTE — Telephone Encounter (Signed)
Monique Nguyen has not received the paper work for the Creon.

## 2012-12-19 NOTE — Telephone Encounter (Signed)
Called, many rings and no answer. ( Samples at front for pt).

## 2012-12-19 NOTE — Telephone Encounter (Signed)
Patient is on the recall list for repeat CT she had one 10/18/12, does she need another now, Please advise?

## 2012-12-19 NOTE — Telephone Encounter (Signed)
PLEASE CALL PT. SHE WILL CONTINUE WITH PROBLEMS IF SHE IS NOT TAKING HER ENZYMES.  SHE SHOULD:   1. PICKUP CREON SAMPLES IF WE HAVE THEM AVAILABLE. SHE SHOULD TAKE 2 WITH MEALS AND ONE WITH SNACKS. 2. SHE SHOULD NOT TRY TO GET PREGNANT. HER DISEASE IS NOT WELL CONTROLLED AND SHE CAN'T AFFORD MEDS FOR HERSELF. 3. USE PHENERGAN 30 MINS PRIOR TO MEALS THREE TIMES A DAY TO HELP WITH NAUSEA.  4. AVOID ALCOHOL 5. CUT DOWN ON SMOKING & SHE SHOULD EVENTUALLY QUIT ALL TOGETHER.  6. CONTINUE CELEXA & PROTONIX.  7. FOLLOW A LOW FAT DIET.  8. FOLLOW UP IN AUG E 30 CHRONIC PANCREATITIS. SHE NEEDS A REPEAT CT ABD PANCREATIC PROTOCOL 1 WEEK PRIOR TO HER VISIT.

## 2012-12-19 NOTE — Telephone Encounter (Signed)
PT called and said she is still having some problems. She has not gotten the Creon yet. Said she was late bringing the paper work back for assistance for the Creon,  but she returned it about 1-2 weeks ago. [place] Spaniel almost everything makes her sick on her stomach. She feels that she is paranoid about what will happen to her. Said she had been trying to get pregnant, but saw GYN doctor recently and was advised not to. She gets mad and ill a lot and she is just worried, She has pain all over . Please advise!

## 2012-12-19 NOTE — Telephone Encounter (Signed)
Message copied by Glendora Score on Wed Dec 19, 2012  7:32 AM ------      Message from: Tonye Pearson      Created: Tue Dec 18, 2012  2:44 PM       Patient need repeat CT abd for Pancreatic per re-call list.      SLF pt            Thanks      Ginger  ------

## 2012-12-20 NOTE — Telephone Encounter (Signed)
Pt returned call and was informed. She said her boy friend's mom brought the paperwork by. I told her to make sure, because Monique Nguyen does not have it. She will check and let me know.

## 2012-12-20 NOTE — Telephone Encounter (Signed)
LMOM to call.

## 2012-12-24 ENCOUNTER — Telehealth: Payer: Self-pay | Admitting: Gastroenterology

## 2012-12-24 ENCOUNTER — Other Ambulatory Visit: Payer: Self-pay | Admitting: Gastroenterology

## 2012-12-24 ENCOUNTER — Encounter: Payer: Self-pay | Admitting: Gastroenterology

## 2012-12-24 DIAGNOSIS — K8689 Other specified diseases of pancreas: Secondary | ICD-10-CM

## 2012-12-24 NOTE — Telephone Encounter (Signed)
Pt is aware of OV on 8/14 at 1030 with SF and appt card was mailed

## 2012-12-24 NOTE — Telephone Encounter (Signed)
Patient is scheduled for CT scan on Monday August 4th at 9:30 and I have mailed her a letter

## 2012-12-24 NOTE — Telephone Encounter (Signed)
Pt is aware of OV on 8/14 at 1030 with SF and will need repeat CT abd pancreatic protocol one week prior to her visit. I mailed her appt card

## 2013-01-02 ENCOUNTER — Encounter (HOSPITAL_COMMUNITY): Payer: Self-pay | Admitting: *Deleted

## 2013-01-02 ENCOUNTER — Emergency Department (HOSPITAL_COMMUNITY): Payer: Self-pay

## 2013-01-02 ENCOUNTER — Emergency Department (HOSPITAL_COMMUNITY)
Admission: EM | Admit: 2013-01-02 | Discharge: 2013-01-02 | Discharge status: Against Medical Advice | Payer: Self-pay | Attending: Emergency Medicine | Admitting: Emergency Medicine

## 2013-01-02 DIAGNOSIS — Z79899 Other long term (current) drug therapy: Secondary | ICD-10-CM | POA: Insufficient documentation

## 2013-01-02 DIAGNOSIS — R059 Cough, unspecified: Secondary | ICD-10-CM | POA: Insufficient documentation

## 2013-01-02 DIAGNOSIS — J069 Acute upper respiratory infection, unspecified: Secondary | ICD-10-CM | POA: Insufficient documentation

## 2013-01-02 DIAGNOSIS — F17200 Nicotine dependence, unspecified, uncomplicated: Secondary | ICD-10-CM | POA: Insufficient documentation

## 2013-01-02 DIAGNOSIS — F329 Major depressive disorder, single episode, unspecified: Secondary | ICD-10-CM | POA: Insufficient documentation

## 2013-01-02 DIAGNOSIS — Z8742 Personal history of other diseases of the female genital tract: Secondary | ICD-10-CM | POA: Insufficient documentation

## 2013-01-02 DIAGNOSIS — Z9119 Patient's noncompliance with other medical treatment and regimen: Secondary | ICD-10-CM | POA: Insufficient documentation

## 2013-01-02 DIAGNOSIS — F411 Generalized anxiety disorder: Secondary | ICD-10-CM | POA: Insufficient documentation

## 2013-01-02 DIAGNOSIS — F1120 Opioid dependence, uncomplicated: Secondary | ICD-10-CM | POA: Insufficient documentation

## 2013-01-02 DIAGNOSIS — K861 Other chronic pancreatitis: Secondary | ICD-10-CM | POA: Insufficient documentation

## 2013-01-02 DIAGNOSIS — R05 Cough: Secondary | ICD-10-CM | POA: Insufficient documentation

## 2013-01-02 DIAGNOSIS — R1011 Right upper quadrant pain: Secondary | ICD-10-CM | POA: Insufficient documentation

## 2013-01-02 DIAGNOSIS — R197 Diarrhea, unspecified: Secondary | ICD-10-CM | POA: Insufficient documentation

## 2013-01-02 DIAGNOSIS — F3289 Other specified depressive episodes: Secondary | ICD-10-CM | POA: Insufficient documentation

## 2013-01-02 DIAGNOSIS — G8929 Other chronic pain: Secondary | ICD-10-CM | POA: Insufficient documentation

## 2013-01-02 DIAGNOSIS — F19939 Other psychoactive substance use, unspecified with withdrawal, unspecified: Secondary | ICD-10-CM | POA: Insufficient documentation

## 2013-01-02 DIAGNOSIS — Z872 Personal history of diseases of the skin and subcutaneous tissue: Secondary | ICD-10-CM | POA: Insufficient documentation

## 2013-01-02 DIAGNOSIS — Z91199 Patient's noncompliance with other medical treatment and regimen due to unspecified reason: Secondary | ICD-10-CM | POA: Insufficient documentation

## 2013-01-02 DIAGNOSIS — R0602 Shortness of breath: Secondary | ICD-10-CM | POA: Insufficient documentation

## 2013-01-02 DIAGNOSIS — Z9114 Patient's other noncompliance with medication regimen: Secondary | ICD-10-CM

## 2013-01-02 DIAGNOSIS — Z8719 Personal history of other diseases of the digestive system: Secondary | ICD-10-CM | POA: Insufficient documentation

## 2013-01-02 DIAGNOSIS — R109 Unspecified abdominal pain: Secondary | ICD-10-CM

## 2013-01-02 DIAGNOSIS — F1123 Opioid dependence with withdrawal: Secondary | ICD-10-CM

## 2013-01-02 DIAGNOSIS — R112 Nausea with vomiting, unspecified: Secondary | ICD-10-CM | POA: Insufficient documentation

## 2013-01-02 MED ORDER — ONDANSETRON 8 MG PO TBDP
8.0000 mg | ORAL_TABLET | Freq: Once | ORAL | Status: AC
  Administered 2013-01-02: 8 mg via ORAL
  Filled 2013-01-02: qty 1

## 2013-01-02 MED ORDER — GUAIFENESIN ER 600 MG PO TB12
1200.0000 mg | ORAL_TABLET | Freq: Once | ORAL | Status: DC
  Filled 2013-01-02: qty 2

## 2013-01-02 NOTE — ED Notes (Signed)
Patient refused xray. Stated she could not afford it. md notified

## 2013-01-02 NOTE — ED Provider Notes (Signed)
History  This chart was scribed for Ward Givens, MD by Ardelia Mems, ED Scribe. This patient was seen in room APA08/APA08 and the patient's care was started at 8:44 PM.  CSN: [id number]  Arrival date & time 01/02/13  1947   Chief Complaint  Patient presents with  . Nausea  . Emesis  . Nasal Congestion    The history is provided by the patient. No language interpreter was used.   HPI Comments: Monique Nguyen is a 42 y.o. female with a history of pancreatitis and IBS who presents to the Emergency Department complaining of a gradually worsening cough, productive of yellow-brown mucus onset 4 days ago. She believes she is suffering from a cold and reports associated nasal congestion and rhinorrhea that is reddish at times and greenish at others, as well as multiple episodes of post-tussive emesis. She states that she is SOB, but that this is normal for her. She denies wheezing as an associated symptom and states that she has never had to use an inhaler.She is unsure if she has had a fever or not.   She also complains of constant, moderate LUQ abdominal pain, which she attributes to her chronic pancreatitis, which she sees Dr. Darrick Penna for. She states her pain is her usual pain. Dr Regino Schultze prescribes her Hydrocodone for this pain and she states that she ran out of this 4 days ago. She also states that she stopped taking her prescribed Creon 4 days ago because of her URI symptoms. She states that Dr. Regino Schultze prescribes her Hydrocodone for this pain and she states that she ran out of Hydrocodone 4 days ago. She states that she had diarrhea this week due to her IBS, which was relieved with Imodium AD.  Pt denies fever, emesis (other than post-tussive) or any other symptoms. She smokes 1 pack/day, and drinks alcohol occasionally. She states that she had about 4 ounces of beer Saturday 4 days ago.  PCP- Dr. Regino Schultze Dr. Darrick Penna- sees for pancreatitis   Past Medical History  Diagnosis Date  . GERD  (gastroesophageal reflux disease)   . Bacterial vaginosis   . Anxiety   . Depression   . Panic attacks   . Psoriasis   . IBS (irritable bowel syndrome)   . Pancreatitis     ETOH   Past Surgical History  Procedure Laterality Date  . Bladder surgery      X 3  . Eus N/A 10/11/2012    Procedure: UPPER ENDOSCOPIC ULTRASOUND (EUS) LINEAR;  Surgeon: Rachael Fee, MD;  Location: [facility];  Service: Endoscopy;  Laterality: N/A;   Family History  Problem Relation Age of Onset  . Depression Mother     living  . Pancreatic cancer Father     living  . Colon cancer Neg Hx   . Colon polyps Neg Hx    History  Substance Use Topics  . Smoking status: Current Every Day Smoker -- 1.50 packs/day for 8 years    Types: Cigarettes  . Smokeless tobacco: Never Used  . Alcohol Use: 1.8 oz/week    3 Cans of beer per week     Comment: states she used to drink 12-15 cans of beer per day, stopped beer in Nov 2013, then started wine, last ETOH at all was in Jan 2014   Lives at home Lives with significant other  OB History   Grav Para Term Preterm Abortions TAB SAB Ect Mult Living  Review of Systems  Constitutional: Negative for fever.  HENT: Positive for congestion and rhinorrhea.   Respiratory: Positive for cough and shortness of breath. Negative for wheezing.   Gastrointestinal: Positive for vomiting (post-tussive only), abdominal pain and diarrhea.  All other systems reviewed and are negative.   Allergies  Doxycycline and Sulfa antibiotics  Home Medications   Current Outpatient Rx  Name  Route  Sig  Dispense  Refill  . HYDROcodone-acetaminophen (NORCO/VICODIN) 5-325 MG per tablet   Oral   Take 1 tablet by mouth every 4 (four) hours as needed.   15 tablet   0   . LORazepam (ATIVAN) 1 MG tablet   Oral   Take 1 tablet (1 mg total) by mouth 3 (three) times daily as needed for anxiety.   12 tablet   0   . Pancrelipase, Lip-Prot-Amyl, 24000 UNITS CPEP   Oral    Take 1-2 capsules by mouth 3 (three) times daily with meals. 2 WITH MEALS AND 1 WITH SNACKS         . promethazine (PHENERGAN) 25 MG tablet   Oral   Take 25 mg by mouth 3 (three) times daily before meals. 1/2-1 PO 30 MINS PRIOR TO MEALS TID         . Pseudoeph-Doxylamine-DM-APAP (DAYQUIL/NYQUIL COLD/FLU RELIEF PO)   Oral   Take 2 capsules by mouth daily as needed (FOR COLD RELIEF).          Triage Vitals: BP 86/60  Pulse 116  Temp(Src) 98.3 F (36.8 C) (Oral)  Resp 20  Ht 5\' 1"  (1.549 m)  Wt 106 lb (48.081 kg)  BMI 20.04 kg/m2  SpO2 100%  LMP 12/12/2012  Vital signs normal except hypotension   Physical Exam  Nursing note and vitals reviewed. Constitutional: She is oriented to person, place, and time.  Non-toxic appearance. She does not appear ill. No distress.  Thin underweight  HENT:  Head: Normocephalic and atraumatic.  Right Ear: External ear normal.  Left Ear: External ear normal.  Nose: Nose normal. No mucosal edema or rhinorrhea.  Mouth/Throat: Oropharynx is clear and moist and mucous membranes are normal. No dental abscesses or edematous.  She has some bogginess of nares bilaterally.  Eyes: Conjunctivae and EOM are normal. Pupils are equal, round, and reactive to light.  Neck: Normal range of motion and full passive range of motion without pain. Neck supple.  Cardiovascular: Normal rate, regular rhythm and normal heart sounds.  Exam reveals no gallop and no friction rub.   No murmur heard. Pulmonary/Chest: Effort normal and breath sounds normal. No respiratory distress. She has no wheezes. She has no rhonchi. She has no rales. She exhibits no tenderness and no crepitus.  Abdominal: Soft. Normal appearance and bowel sounds are normal. She exhibits no distension. There is tenderness. There is no rebound and no guarding.  Mild tenderness in LUQ.  Musculoskeletal: Normal range of motion. She exhibits no edema and no tenderness.  Moves all extremities well.    Neurological: She is alert and oriented to person, place, and time. She has normal strength. No cranial nerve deficit.  Skin: Skin is warm, dry and intact. No rash noted. No erythema. No pallor.  Psychiatric: She has a normal mood and affect. Her speech is normal and behavior is normal. Her mood appears not anxious.    ED Course  Procedures (including critical care time)  Medications  guaiFENesin (MUCINEX) 12 hr tablet 1,200 mg (1,200 mg Oral Not Given 01/02/13 2143)  ondansetron (  ZOFRAN-ODT) disintegrating tablet 8 mg (8 mg Oral Given 01/02/13 2059)   DIAGNOSTIC STUDIES: Oxygen Saturation is 100% on RA, normal by my interpretation.    COORDINATION OF CARE: 8:49 PM- Pt advised of plan for treatment and pt agrees.  Review of her chart shows she's been communicating with Dr. Darrick Penna office and she has not been compliant with her Creon. Patient told them she cannot afford it.  21:00 patient refused to have the chest x-ray done that was ordered.  2142 patient was not in the room when the nurse went in to give her her prescribed medication.  Results for orders placed in visit on 10/18/12  LIPASE      Result Value Range   Lipase 35  0 - 75 U/L  PREGNANCY, URINE      Result Value Range   Preg Test, Ur NEG    HEPATIC FUNCTION PANEL      Result Value Range   Total Bilirubin 0.4  0.3 - 1.2 mg/dL   Bilirubin, Direct 0.1  0.0 - 0.3 mg/dL   Indirect Bilirubin 0.3  0.0 - 0.9 mg/dL   Alkaline Phosphatase 118 (*) 39 - 117 U/L   AST 33  0 - 37 U/L   ALT 26  0 - 35 U/L   Total Protein 7.1  6.0 - 8.3 g/dL   Albumin 3.9  3.5 - 5.2 g/dL      1. URI (upper respiratory infection)   2. Chronic abdominal pain   3. Narcotic withdrawal   4. Noncompliance with medication regimen    Pt left AMA   Devoria Albe, MD, FACEP   MDM    I personally performed the services described in this documentation, which was scribed in my presence. The recorded information has been reviewed and  considered.  Devoria Albe, MD, FACEP    Ward Givens, MD 01/03/13 [id number]

## 2013-01-02 NOTE — ED Notes (Signed)
Pt reporting some n/v and abdominal pain due to chronic pancreatitis.  Also reporting congestion, coughing and sneezing.

## 2013-01-03 ENCOUNTER — Other Ambulatory Visit: Payer: Self-pay | Admitting: Gastroenterology

## 2013-01-04 ENCOUNTER — Other Ambulatory Visit: Payer: Self-pay | Admitting: Gastroenterology

## 2013-01-05 ENCOUNTER — Emergency Department (HOSPITAL_COMMUNITY)
Admission: EM | Admit: 2013-01-05 | Discharge: 2013-01-05 | Disposition: A | Discharge status: Home / Self Care | Payer: Self-pay | Attending: Emergency Medicine | Admitting: Emergency Medicine

## 2013-01-05 ENCOUNTER — Emergency Department (HOSPITAL_COMMUNITY): Payer: Self-pay

## 2013-01-05 ENCOUNTER — Encounter (HOSPITAL_COMMUNITY): Payer: Self-pay

## 2013-01-05 DIAGNOSIS — R51 Headache: Secondary | ICD-10-CM | POA: Insufficient documentation

## 2013-01-05 DIAGNOSIS — F410 Panic disorder [episodic paroxysmal anxiety] without agoraphobia: Secondary | ICD-10-CM | POA: Insufficient documentation

## 2013-01-05 DIAGNOSIS — R110 Nausea: Secondary | ICD-10-CM | POA: Insufficient documentation

## 2013-01-05 DIAGNOSIS — J40 Bronchitis, not specified as acute or chronic: Secondary | ICD-10-CM | POA: Insufficient documentation

## 2013-01-05 DIAGNOSIS — Z8619 Personal history of other infectious and parasitic diseases: Secondary | ICD-10-CM | POA: Insufficient documentation

## 2013-01-05 DIAGNOSIS — Z872 Personal history of diseases of the skin and subcutaneous tissue: Secondary | ICD-10-CM | POA: Insufficient documentation

## 2013-01-05 DIAGNOSIS — J3489 Other specified disorders of nose and nasal sinuses: Secondary | ICD-10-CM | POA: Insufficient documentation

## 2013-01-05 DIAGNOSIS — Z79899 Other long term (current) drug therapy: Secondary | ICD-10-CM | POA: Insufficient documentation

## 2013-01-05 DIAGNOSIS — F3289 Other specified depressive episodes: Secondary | ICD-10-CM | POA: Insufficient documentation

## 2013-01-05 DIAGNOSIS — F329 Major depressive disorder, single episode, unspecified: Secondary | ICD-10-CM | POA: Insufficient documentation

## 2013-01-05 DIAGNOSIS — Z8719 Personal history of other diseases of the digestive system: Secondary | ICD-10-CM | POA: Insufficient documentation

## 2013-01-05 DIAGNOSIS — F17200 Nicotine dependence, unspecified, uncomplicated: Secondary | ICD-10-CM | POA: Insufficient documentation

## 2013-01-05 MED ORDER — ALBUTEROL SULFATE HFA 108 (90 BASE) MCG/ACT IN AERS: 1.0000 | INHALATION_SPRAY | Freq: Four times a day (QID) | RESPIRATORY_TRACT | Status: DC | PRN

## 2013-01-05 MED ORDER — ALBUTEROL SULFATE HFA 108 (90 BASE) MCG/ACT IN AERS
2.0000 | INHALATION_SPRAY | Freq: Once | RESPIRATORY_TRACT | Status: AC
  Administered 2013-01-05: 2 via RESPIRATORY_TRACT
  Filled 2013-01-05: qty 6.7

## 2013-01-05 MED ORDER — ACETAMINOPHEN 325 MG PO TABS
650.0000 mg | ORAL_TABLET | Freq: Once | ORAL | Status: AC
  Administered 2013-01-05: 650 mg via ORAL
  Filled 2013-01-05: qty 2

## 2013-01-05 NOTE — ED Notes (Signed)
Pt c/o cough, congestion, headache x1 week. Pt states cough is non-productive. Pt has tried "multiple otc meds" but reports no relief. Pt denies fever, SOB, sore throat.

## 2013-01-05 NOTE — ED Notes (Signed)
Congestion in my head and chest. Pain in my face, throat, and chest from coughing so much.

## 2013-01-05 NOTE — ED Provider Notes (Signed)
CSN: [id number]     Arrival date & time 01/05/13  1949 History     First MD Initiated Contact with Patient 01/05/13 1959     Chief Complaint  Patient presents with  . Nasal Congestion  . Cough   (Consider location/radiation/quality/duration/timing/severity/associated sxs/prior Treatment) HPI Comments: 42 yo female with pancreatitis hx, smoking hx presents with gradually worsening productive cough, congestion, frontal HA for one week.  No improvement with OTC meds.  No fevers, chills or blood clot hx.  No DM hx.  Pt decreased po intake chronic.  Nothing improves.  No recent travel or sick contacts. HA similalr to previous, frontal, gradual onset, with coughing.   Patient is a 42 y.o. female presenting with cough. The history is provided by the patient.  Cough Cough characteristics:  Productive Associated symptoms: headaches   Associated symptoms: no chest pain, no chills, no fever, no rash and no shortness of breath     Past Medical History  Diagnosis Date  . GERD (gastroesophageal reflux disease)   . Bacterial vaginosis   . Anxiety   . Depression   . Panic attacks   . Psoriasis   . IBS (irritable bowel syndrome)   . Pancreatitis     ETOH   Past Surgical History  Procedure Laterality Date  . Bladder surgery      X 3  . Eus N/A 10/11/2012    Procedure: UPPER ENDOSCOPIC ULTRASOUND (EUS) LINEAR;  Surgeon: Rachael Fee, MD;  Location: [facility];  Service: Endoscopy;  Laterality: N/A;   Family History  Problem Relation Age of Onset  . Depression Mother     living  . Pancreatic cancer Father     living  . Colon cancer Neg Hx   . Colon polyps Neg Hx    History  Substance Use Topics  . Smoking status: Current Every Day Smoker -- 1.50 packs/day for 8 years    Types: Cigarettes  . Smokeless tobacco: Never Used  . Alcohol Use: No     Comment: states she used to drink 12-15 cans of beer per day, stopped beer in Nov 2013, then started wine, last ETOH at all was in Jan  2014   OB History   Grav Para Term Preterm Abortions TAB SAB Ect Mult Living                 Review of Systems  Constitutional: Negative for fever and chills.  HENT: Negative for neck pain and neck stiffness.   Respiratory: Positive for cough. Negative for shortness of breath.   Cardiovascular: Negative for chest pain and leg swelling.  Gastrointestinal: Positive for nausea. Negative for vomiting.  Musculoskeletal: Negative for back pain.  Skin: Negative for rash.  Neurological: Positive for headaches.    Allergies  Doxycycline and Sulfa antibiotics  Home Medications   Current Outpatient Rx  Name  Route  Sig  Dispense  Refill  . HYDROcodone-acetaminophen (NORCO/VICODIN) 5-325 MG per tablet   Oral   Take 1 tablet by mouth every 4 (four) hours as needed.   15 tablet   0   . LORazepam (ATIVAN) 1 MG tablet   Oral   Take 1 tablet (1 mg total) by mouth 3 (three) times daily as needed for anxiety.   12 tablet   0   . ondansetron (ZOFRAN-ODT) 4 MG disintegrating tablet      dissolve 1 tablet ON TONGUE before meals and at bedtime   30 tablet   3   .  Pancrelipase, Lip-Prot-Amyl, 24000 UNITS CPEP   Oral   Take 1-2 capsules by mouth 3 (three) times daily with meals. 2 WITH MEALS AND 1 WITH SNACKS         . promethazine (PHENERGAN) 25 MG tablet   Oral   Take 25 mg by mouth 3 (three) times daily before meals. 1/2-1 PO 30 MINS PRIOR TO MEALS TID         . Pseudoeph-Doxylamine-DM-APAP (DAYQUIL/NYQUIL COLD/FLU RELIEF PO)   Oral   Take 2 capsules by mouth daily as needed (FOR COLD RELIEF).          BP 100/68  Pulse 100  Temp(Src) 98.1 F (36.7 C) (Oral)  Ht 5\' 1"  (1.549 m)  Wt 106 lb (48.081 kg)  BMI 20.04 kg/m2  SpO2 100%  LMP 12/12/2012 Physical Exam  Nursing note and vitals reviewed. Constitutional: She is oriented to person, place, and time. She appears well-developed and well-nourished.  HENT:  Head: Normocephalic and atraumatic.  Eyes: Conjunctivae are  normal. Right eye exhibits no discharge. Left eye exhibits no discharge.  Neck: Normal range of motion. Neck supple. No tracheal deviation present.  Cardiovascular: Regular rhythm.   No murmur heard. Pulmonary/Chest: Effort normal and breath sounds normal.  Musculoskeletal: She exhibits no edema and no tenderness.  Neurological: She is alert and oriented to person, place, and time.  Skin: Skin is warm. No rash noted.  Psychiatric: She has a normal mood and affect.    ED Course   Procedures (including critical care time)  Labs Reviewed - No data to display No results found. No diagnosis found.  MDM  Well appearing  Clinically bronchitis. CXR reviewed. Inhaler trial outpt.  Smoking Cessation discussed 5 minutes, options and outpt fup. DC discussed.  No results found. Dg Chest 2 View  01/05/2013   *RADIOLOGY REPORT*  Clinical Data: Cough.  Congestion.  CHEST - 2 VIEW  Comparison: 06/27/2012  Findings: Heart size is normal.  Mediastinal shadows are normal. There is bronchial thickening cyst with bronchitis.  No infiltrate, collapse or effusion.  No significant bony finding.  IMPRESSION: Bronchitis.  No consolidation or collapse.   Original Report Authenticated By: Paulina Fusi, M.D.     Enid Skeens, MD 01/05/13 2041

## 2013-01-14 ENCOUNTER — Telehealth: Payer: Self-pay

## 2013-01-14 ENCOUNTER — Ambulatory Visit (HOSPITAL_COMMUNITY): Payer: Self-pay

## 2013-01-14 NOTE — Telephone Encounter (Signed)
Pt had left a VM asking for pain medications. I returned her call, [facility] for a return call.

## 2013-01-15 ENCOUNTER — Other Ambulatory Visit: Payer: Self-pay | Admitting: Gastroenterology

## 2013-01-15 DIAGNOSIS — K852 Alcohol induced acute pancreatitis without necrosis or infection: Secondary | ICD-10-CM

## 2013-01-15 DIAGNOSIS — R1013 Epigastric pain: Secondary | ICD-10-CM

## 2013-01-15 DIAGNOSIS — G8929 Other chronic pain: Secondary | ICD-10-CM

## 2013-01-15 NOTE — Telephone Encounter (Signed)
PLEASE CALL PT. i do not prescribe narcotics for chronic pain. She needs to see a pain specialist. I will refer her to a pain MD for chronic abdominal pain/ETOH pancreatitis.

## 2013-01-15 NOTE — Telephone Encounter (Signed)
Pt called back today. Said Dr. Regino Schultze will not give her pain meds, said she has to come in for OV and she doesn't have money for OV. She asked if Dr. Darrick Penna could give her something for her left side pain that hurts most of the time. She said she is taking the med for nausea and is not having any vomiting. I told her that Dr. Darrick Penna does not prescribe pain meds but I will let her know that she asked.

## 2013-01-15 NOTE — Telephone Encounter (Signed)
Referral has been faxed to Dr. Gerilyn Pilgrim and they will contact Ms. Davison to schedule

## 2013-01-15 NOTE — Telephone Encounter (Signed)
Called and left the message on Vm and told her that Monique Nguyen will make the referral and if she has questions, please call.

## 2013-01-24 ENCOUNTER — Telehealth: Payer: Self-pay | Admitting: *Deleted

## 2013-01-24 ENCOUNTER — Ambulatory Visit: Payer: Self-pay | Admitting: Gastroenterology

## 2013-01-24 NOTE — Telephone Encounter (Signed)
REVIEWED.  

## 2013-01-28 ENCOUNTER — Telehealth: Payer: Self-pay

## 2013-01-28 NOTE — Telephone Encounter (Signed)
Pt left a Vm to call her, she requested some samples of Creon and said she had something else to discuss. I called and got VM, LMOM for a return call. ( she was a no show on 01/24/2013).

## 2013-01-29 NOTE — Telephone Encounter (Signed)
Pt left me a VM. I called and LMOM to call.

## 2013-02-06 ENCOUNTER — Other Ambulatory Visit: Payer: Self-pay | Admitting: Gastroenterology

## 2013-02-06 ENCOUNTER — Encounter: Payer: Self-pay | Admitting: Gastroenterology

## 2013-02-06 DIAGNOSIS — K859 Acute pancreatitis without necrosis or infection, unspecified: Secondary | ICD-10-CM

## 2013-02-06 NOTE — Telephone Encounter (Signed)
LATE ENTRY:  I called pt yesterday afternoon and she is still wanting some creon samples and I told her I would have to check with Dr. Evelina Dun since she did not keep her last appt. She said she just does not keep up with her appts etc.  She also said that she was supposed to have had a CT at one time and she did not have it and she was wondering if she could have it. Please advise!

## 2013-02-06 NOTE — Telephone Encounter (Signed)
Patient is aware of OV on 9/17 at 10 with SF and appt card was mailed

## 2013-02-06 NOTE — Telephone Encounter (Signed)
PLEASE CALL PT. SHE NEEDS TO COMPLETE THE PAPERWORK FOR CREON SAMPLES. SHE MAY PICK UP A BOTTLE IN THE OFC. SHE NEEDS HER CT SCAN PANCREATIC PROTOCOL WITHIN THE NEXT 2 WEEKS. OPV E30 CHRONIC PANCREATITIS 1ST AVAILABLE SLOT WITH AS OR [facility].

## 2013-02-06 NOTE — Telephone Encounter (Signed)
Spoke to pt when Monique Nguyen had her on the phone for the CT date. Gave her the info again. She asked me did I think she should go to the ED. I asked her what is going on at this time. She said her stomach hurts and she does not have anything for pain. I told her again, Dr. Darrick Penna does not give narcotics for pain. She said she knew it was coming from her pancreas. I told her the CT should let them know about that. ( It is scheduled for next Wed). She said she has called the pain clinic and left message. I told her if her pain gets severe to go to the ED.

## 2013-02-06 NOTE — Telephone Encounter (Signed)
CT scan scheduled for Wednesday 02/13/13 patient is to arrive at 8:30

## 2013-02-06 NOTE — Telephone Encounter (Signed)
REVIEWED.  

## 2013-02-06 NOTE — Telephone Encounter (Signed)
Samples of the Creon 24000 units at front for pick up #48). LMOM that pt could pick up, needs to do the CT and OV and will be called with that info. Also, per Raynelle Fanning, I asked pt to call assistance program for the medication. Raynelle Fanning faxed it in July. She needs to call and see what the problem is. Left return phone number for pt if questions.

## 2013-02-13 ENCOUNTER — Other Ambulatory Visit (HOSPITAL_COMMUNITY): Payer: Self-pay

## 2013-02-13 ENCOUNTER — Ambulatory Visit (HOSPITAL_COMMUNITY): Payer: Self-pay

## 2013-02-19 ENCOUNTER — Ambulatory Visit (HOSPITAL_COMMUNITY)
Admission: RE | Admit: 2013-02-19 | Discharge: 2013-02-19 | Disposition: A | Discharge status: Home / Self Care | Payer: Self-pay | Source: Ambulatory Visit | Attending: Gastroenterology | Admitting: Gastroenterology

## 2013-02-19 DIAGNOSIS — K859 Acute pancreatitis without necrosis or infection, unspecified: Secondary | ICD-10-CM | POA: Insufficient documentation

## 2013-02-19 DIAGNOSIS — R634 Abnormal weight loss: Secondary | ICD-10-CM | POA: Insufficient documentation

## 2013-02-19 DIAGNOSIS — R1012 Left upper quadrant pain: Secondary | ICD-10-CM | POA: Insufficient documentation

## 2013-02-19 DIAGNOSIS — K8689 Other specified diseases of pancreas: Secondary | ICD-10-CM | POA: Insufficient documentation

## 2013-02-19 DIAGNOSIS — R110 Nausea: Secondary | ICD-10-CM | POA: Insufficient documentation

## 2013-02-19 MED ORDER — IOHEXOL 300 MG/ML  SOLN
100.0000 mL | Freq: Once | INTRAMUSCULAR | Status: AC | PRN
  Administered 2013-02-19: 100 mL via INTRAVENOUS

## 2013-02-25 ENCOUNTER — Telehealth: Payer: Self-pay | Admitting: Gastroenterology

## 2013-02-25 NOTE — Telephone Encounter (Signed)
Patient is scheduled for Intake Class on Oct 4th per Irving Burton at Dr. Harriett Sine office

## 2013-02-27 ENCOUNTER — Ambulatory Visit: Payer: Self-pay | Admitting: Gastroenterology

## 2013-02-28 ENCOUNTER — Telehealth: Payer: Self-pay | Admitting: Gastroenterology

## 2013-02-28 ENCOUNTER — Ambulatory Visit: Payer: Self-pay | Admitting: Gastroenterology

## 2013-02-28 NOTE — Telephone Encounter (Signed)
Pt was a no show

## 2013-02-28 NOTE — Telephone Encounter (Signed)
REVIEWED.  

## 2013-03-02 NOTE — Telephone Encounter (Addendum)
PLEASE CALL PT. SHE MISSED HER OPV SEP 18. HER CT SHOWS RESOLVING PANCREATITIS AND PANCREATIC PSEUDOCYST. SHE HAS AN AREA THAT NEEDS TO BE EVALUATED BY MRI IN 4-6 WEEKS(MRI ABDOMEN W/ AND W/O GADOLINIUM CONTRAST). SHE SHOULD RSC HER OPV E30 AFTER HER MRI IS COMPLETE IN 4-6 WEEKS W/ [facility] OR AS.

## 2013-03-04 NOTE — Telephone Encounter (Signed)
Called and informed pt. She requested samples of Creon. 3 bottles (#36 tablets) given. Per JL she has sent in the pt assistance forms and pt needs to call the number on the paperwork and check on it. Pt is aware.

## 2013-03-06 ENCOUNTER — Telehealth: Payer: Self-pay | Admitting: Gastroenterology

## 2013-03-06 ENCOUNTER — Other Ambulatory Visit: Payer: Self-pay | Admitting: Gastroenterology

## 2013-03-06 DIAGNOSIS — K863 Pseudocyst of pancreas: Secondary | ICD-10-CM

## 2013-03-06 NOTE — Telephone Encounter (Signed)
Pt is needing MRI and needs OV with SF or AS 4-6 weeks after she has it done. Has she scheduled her MRI yet? I can not reach patient by phone and have l left a message for her to call me back.

## 2013-03-06 NOTE — Telephone Encounter (Signed)
No Im waiting to hear back from her

## 2013-03-07 ENCOUNTER — Telehealth: Payer: Self-pay

## 2013-03-07 NOTE — Telephone Encounter (Signed)
PT called to speak to Monique Nguyen about the MRI. She wanted me to let Dr. Darrick Penna know that she is scheduled for the pain clinic for next week. She is taking the nausea medication, and still has dry heaves 4-5 times a week. She still has some burning sometimes in her stomach, but she will try to get the MRI done and see what that shows.

## 2013-03-07 NOTE — Telephone Encounter (Signed)
REVIEWED.  

## 2013-03-07 NOTE — Telephone Encounter (Signed)
LMOM for patient to return my call.

## 2013-03-08 ENCOUNTER — Encounter: Payer: Self-pay | Admitting: Gastroenterology

## 2013-03-08 NOTE — Telephone Encounter (Signed)
Pt is aware of OV on 11/10 at 2 with AS and appt card was mailed °

## 2013-03-08 NOTE — Telephone Encounter (Signed)
Pt is aware of OV on 11/10 at 2 with AS and appt card was mailed

## 2013-03-12 ENCOUNTER — Other Ambulatory Visit (HOSPITAL_COMMUNITY): Payer: Self-pay

## 2013-03-13 ENCOUNTER — Other Ambulatory Visit (HOSPITAL_COMMUNITY): Payer: Self-pay

## 2013-03-18 ENCOUNTER — Ambulatory Visit (HOSPITAL_COMMUNITY): Payer: Self-pay | Attending: Gastroenterology

## 2013-03-20 ENCOUNTER — Telehealth: Payer: Self-pay

## 2013-03-20 NOTE — Telephone Encounter (Signed)
[facility] Assistance papers were mailed out to patient today.

## 2013-03-20 NOTE — Telephone Encounter (Signed)
PLEASE CALL PT. SHE MAY PICKUP SAMPLES OF CREON #1 BOTTLE. LET HER KNOW SHE WILL BE RECEIVING [facility]ISTANCE PAPERWORK IN THE MAIL.  AVOID ETOH  USE CARNATION INSTANT BREAKFAST, ENSURE OR BOOT QID  USE ZOFRAN OR PHENERGAN PRN  CUT DOWN ON SMOKING  CONTINUE CELEXA  OPV NOV 10 W/ AS

## 2013-03-20 NOTE — Telephone Encounter (Signed)
Pt called and said she has not been able to afford getting MRI and OV's. She is out of creon. Per Raynelle Fanning, pt needs to call [phone] and see what the status is.   She said she is losing weight ( now weighs 99 lbs). No appetite, and has a lot of abd pain.   She has not been to the pain clinic.They wanted $35.00 up front and then they would ask for $250.00 for her to come back in and find out if they would be able to help her.   Darl Pikes is sending the pt some forms to apply for [facility] Assistance ( she lost previous paperwork).   Routing to Dr. Darrick Penna for advice!

## 2013-03-25 NOTE — Telephone Encounter (Signed)
LMOM to come by and get samples

## 2013-03-26 ENCOUNTER — Telehealth: Payer: Self-pay

## 2013-03-26 NOTE — Telephone Encounter (Signed)
PLEASE CALL PT. SHE SHOULD DISCUSS DRUG CONCERNS WITH HER PHARMACIST. PROZAC WILL NOT CAUSE DAMAGE TO HER PANCREAS.

## 2013-03-26 NOTE — Telephone Encounter (Signed)
Pt has started on Prozac and would like to know if it is ok to take it. Please advise

## 2013-03-27 NOTE — Telephone Encounter (Signed)
MRI is scheduled on Thursday October 16th at 1:00 and she is aware

## 2013-03-27 NOTE — Telephone Encounter (Signed)
Tried to call with no answer  

## 2013-03-28 ENCOUNTER — Ambulatory Visit (HOSPITAL_COMMUNITY)
Admission: RE | Admit: 2013-03-28 | Discharge: 2013-03-28 | Disposition: A | Discharge status: Home / Self Care | Payer: Self-pay | Source: Ambulatory Visit | Attending: Gastroenterology | Admitting: Gastroenterology

## 2013-03-28 DIAGNOSIS — K862 Cyst of pancreas: Secondary | ICD-10-CM | POA: Insufficient documentation

## 2013-03-28 DIAGNOSIS — K863 Pseudocyst of pancreas: Secondary | ICD-10-CM | POA: Insufficient documentation

## 2013-03-28 DIAGNOSIS — I82891 Chronic embolism and thrombosis of other specified veins: Secondary | ICD-10-CM | POA: Insufficient documentation

## 2013-03-28 MED ORDER — GADOBENATE DIMEGLUMINE 529 MG/ML IV SOLN
9.0000 mL | Freq: Once | INTRAVENOUS | Status: AC | PRN
  Administered 2013-03-28: 9 mL via INTRAVENOUS

## 2013-03-28 NOTE — Telephone Encounter (Addendum)
PLEASE CALL PT. HER MRI SHOWS TWO LARGE CYST AROUND HER PANCREAS.  SHE SHOULD:  1. CONTINUE PANCREAS ENZYMES 2 WITH MEALS AND 1 WITH SNACK,S 2. USE CARNATION INSTANT BREAKFAST, ENSURE OR BOOT QID  3. USE ZOFRAN OR PHENERGAN PRN  4. CUT DOWN ON SMOKING  5. CONTINUE CELEXA  6. OPV NOV 10

## 2013-03-29 MED ORDER — PROMETHAZINE HCL 25 MG PO TABS: ORAL_TABLET | ORAL | Status: DC

## 2013-03-29 NOTE — Telephone Encounter (Signed)
LMOM to call.

## 2013-03-29 NOTE — Addendum Note (Signed)
Addended by: Tiffany Kocher on: 03/29/2013 11:31 AM   Modules accepted: Orders

## 2013-03-29 NOTE — Telephone Encounter (Signed)
done

## 2013-03-29 NOTE — Telephone Encounter (Signed)
Called and informed pt. She would like some phenergan called in to [facility] in [place]. Routing to Tana Coast, PA in Dr. Darrick Penna absence today.

## 2013-04-02 NOTE — Telephone Encounter (Signed)
Pt aware that [facility] is aware she is taking the Prozac

## 2013-04-05 ENCOUNTER — Emergency Department (HOSPITAL_COMMUNITY): Payer: Self-pay

## 2013-04-05 ENCOUNTER — Emergency Department (HOSPITAL_COMMUNITY)
Admission: EM | Admit: 2013-04-05 | Discharge: 2013-04-05 | Disposition: A | Discharge status: Home / Self Care | Payer: Self-pay | Attending: Emergency Medicine | Admitting: Emergency Medicine

## 2013-04-05 ENCOUNTER — Encounter (HOSPITAL_COMMUNITY): Payer: Self-pay | Admitting: Emergency Medicine

## 2013-04-05 DIAGNOSIS — Z8742 Personal history of other diseases of the female genital tract: Secondary | ICD-10-CM | POA: Insufficient documentation

## 2013-04-05 DIAGNOSIS — R0609 Other forms of dyspnea: Secondary | ICD-10-CM | POA: Insufficient documentation

## 2013-04-05 DIAGNOSIS — F17200 Nicotine dependence, unspecified, uncomplicated: Secondary | ICD-10-CM | POA: Insufficient documentation

## 2013-04-05 DIAGNOSIS — F3289 Other specified depressive episodes: Secondary | ICD-10-CM | POA: Insufficient documentation

## 2013-04-05 DIAGNOSIS — R079 Chest pain, unspecified: Secondary | ICD-10-CM | POA: Insufficient documentation

## 2013-04-05 DIAGNOSIS — R0989 Other specified symptoms and signs involving the circulatory and respiratory systems: Secondary | ICD-10-CM | POA: Insufficient documentation

## 2013-04-05 DIAGNOSIS — Z872 Personal history of diseases of the skin and subcutaneous tissue: Secondary | ICD-10-CM | POA: Insufficient documentation

## 2013-04-05 DIAGNOSIS — F410 Panic disorder [episodic paroxysmal anxiety] without agoraphobia: Secondary | ICD-10-CM | POA: Insufficient documentation

## 2013-04-05 DIAGNOSIS — F411 Generalized anxiety disorder: Secondary | ICD-10-CM | POA: Insufficient documentation

## 2013-04-05 DIAGNOSIS — F329 Major depressive disorder, single episode, unspecified: Secondary | ICD-10-CM | POA: Insufficient documentation

## 2013-04-05 DIAGNOSIS — Z8719 Personal history of other diseases of the digestive system: Secondary | ICD-10-CM | POA: Insufficient documentation

## 2013-04-05 DIAGNOSIS — Z79899 Other long term (current) drug therapy: Secondary | ICD-10-CM | POA: Insufficient documentation

## 2013-04-05 DIAGNOSIS — R0600 Dyspnea, unspecified: Secondary | ICD-10-CM

## 2013-04-05 LAB — CBC WITH DIFFERENTIAL/PLATELET
Basophils Absolute: 0 10*3/uL (ref 0.0–0.1)
Basophils Relative: 0 % (ref 0–1)
Eosinophils Absolute: 0.2 10*3/uL (ref 0.0–0.7)
Eosinophils Relative: 3 % (ref 0–5)
HCT: 34.5 % — ABNORMAL LOW (ref 36.0–46.0)
Hemoglobin: 11.8 g/dL — ABNORMAL LOW (ref 12.0–15.0)
MCH: 31.6 pg (ref 26.0–34.0)
MCHC: 34.2 g/dL (ref 30.0–36.0)
MCV: 92.2 fL (ref 78.0–100.0)
Monocytes Absolute: 0.5 10*3/uL (ref 0.1–1.0)
Monocytes Relative: 9 % (ref 3–12)
Neutro Abs: 3.4 10*3/uL (ref 1.7–7.7)
RDW: 14.3 % (ref 11.5–15.5)

## 2013-04-05 LAB — BASIC METABOLIC PANEL
BUN: 6 mg/dL (ref 6–23)
Calcium: 8.5 mg/dL (ref 8.4–10.5)
Creatinine, Ser: 0.46 mg/dL — ABNORMAL LOW (ref 0.50–1.10)
GFR calc Af Amer: >90 mL/min (ref >90)
GFR calc non Af Amer: >90 mL/min (ref >90)

## 2013-04-05 LAB — TROPONIN I: Troponin I: <0.3 ng/mL (ref <0.30)

## 2013-04-05 NOTE — ED Notes (Signed)
Pt states she does not have any energy and has been sleeping a lot.

## 2013-04-05 NOTE — ED Notes (Signed)
Pt c/o sob x 2 days. 

## 2013-04-05 NOTE — ED Provider Notes (Signed)
CSN: [id number]     Arrival date & time 04/05/13  2028 History   First MD Initiated Contact with Patient 04/05/13 2047     This chart was scribed for Charles B. Bernette Mayers, MD by Manuela Schwartz, ED scribe. This patient was seen in room APA08/APA08 and the patient's care was started at 2028.  Chief Complaint  Patient presents with  . Shortness of Breath   The history is provided by the patient. No language interpreter was used.   HPI Comments: Monique Nguyen is a 42 y.o. female who presents to the Emergency Department complaining of constant SOB, onset 5 days ago which makes her fell tired and sleep a lot. She states nothing makes her SOB better or worse. She denies any recent illnesses or cough, fever/chills. No orthopnea or DOE. She reports some chest pressure at times, but none in the ED.   She is a smoker Past Medical History  Diagnosis Date  . GERD (gastroesophageal reflux disease)   . Bacterial vaginosis   . Anxiety   . Depression   . Panic attacks   . Psoriasis   . IBS (irritable bowel syndrome)   . Pancreatitis     ETOH   Past Surgical History  Procedure Laterality Date  . Bladder surgery      X 3  . Eus N/A 10/11/2012    Procedure: UPPER ENDOSCOPIC ULTRASOUND (EUS) LINEAR;  Surgeon: Rachael Fee, MD;  Location: [facility];  Service: Endoscopy;  Laterality: N/A;   Family History  Problem Relation Age of Onset  . Depression Mother     living  . Pancreatic cancer Father     living  . Colon cancer Neg Hx   . Colon polyps Neg Hx    History  Substance Use Topics  . Smoking status: Current Every Day Smoker -- 1.50 packs/day for 8 years    Types: Cigarettes  . Smokeless tobacco: Never Used  . Alcohol Use: No     Comment: states she used to drink 12-15 cans of beer per day, stopped beer in Nov 2013, then started wine, last ETOH at all was in Jan 2014   OB History   Grav Para Term Preterm Abortions TAB SAB Ect Mult Living                 Review of Systems   Constitutional: Negative for fever and chills.  Respiratory: Positive for shortness of breath.   Cardiovascular: Positive for chest pain.  Gastrointestinal: Negative for nausea and vomiting.  Neurological: Negative for weakness.  All other systems reviewed and are negative.   A complete 10 system review of systems was obtained and all systems are negative except as noted in the HPI and PMH.   Allergies  Doxycycline and Sulfa antibiotics  Home Medications   Current Outpatient Rx  Name  Route  Sig  Dispense  Refill  . albuterol (PROVENTIL HFA;VENTOLIN HFA) 108 (90 BASE) MCG/ACT inhaler   Inhalation   Inhale 1-2 puffs into the lungs every 6 (six) hours as needed for wheezing.   1 Inhaler   0   . HYDROcodone-acetaminophen (NORCO/VICODIN) 5-325 MG per tablet   Oral   Take 1 tablet by mouth every 4 (four) hours as needed.   15 tablet   0   . LORazepam (ATIVAN) 1 MG tablet   Oral   Take 1 tablet (1 mg total) by mouth 3 (three) times daily as needed for anxiety.   12 tablet  0   . ondansetron (ZOFRAN-ODT) 4 MG disintegrating tablet      dissolve 1 tablet ON TONGUE before meals and at bedtime   30 tablet   3   . Pancrelipase, Lip-Prot-Amyl, 24000 UNITS CPEP   Oral   Take 1-2 capsules by mouth 3 (three) times daily with meals. 2 WITH MEALS AND 1 WITH SNACKS         . promethazine (PHENERGAN) 25 MG tablet      1/2-1 PO 30 MINS PRIOR TO MEALS TID prn   30 tablet   1   . Pseudoeph-Doxylamine-DM-APAP (DAYQUIL/NYQUIL COLD/FLU RELIEF PO)   Oral   Take 2 capsules by mouth daily as needed (FOR COLD RELIEF).          Triage vitals: BP 107/55  Pulse 98  Temp(Src) 97.9 F (36.6 C) (Oral)  Resp 28  Ht 5\' 1"  (1.549 m)  Wt 101 lb (45.813 kg)  BMI 19.09 kg/m2  SpO2 100%  LMP 03/13/2013 Physical Exam  Nursing note and vitals reviewed. Constitutional: She is oriented to person, place, and time. She appears well-developed and well-nourished.  HENT:  Head:  Normocephalic and atraumatic.  Eyes: EOM are normal. Pupils are equal, round, and reactive to light.  Neck: Normal range of motion. Neck supple.  Cardiovascular: Normal rate, normal heart sounds and intact distal pulses.   Pulmonary/Chest: Effort normal and breath sounds normal.  Abdominal: Bowel sounds are normal. She exhibits no distension. There is no tenderness.  Musculoskeletal: Normal range of motion. She exhibits no edema and no tenderness.  Neurological: She is alert and oriented to person, place, and time. She has normal strength. No cranial nerve deficit or sensory deficit.  Skin: Skin is warm and dry. No rash noted.  Psychiatric: She has a normal mood and affect.    ED Course  Procedures (including critical care time) DIAGNOSTIC STUDIES: Oxygen Saturation is 100% on room air, normal by my interpretation.    COORDINATION OF CARE: At 86 PM Discussed treatment plan with patient which includes CXR, blood work, cardiac enzymes, EKG. Patient agrees.   Labs Review Labs Reviewed  CBC WITH DIFFERENTIAL - Abnormal; Notable for the following:    RBC 3.74 (*)    Hemoglobin 11.8 (*)    HCT 34.5 (*)    All other components within normal limits  BASIC METABOLIC PANEL - Abnormal; Notable for the following:    Potassium 3.1 (*)    Glucose, Bld 107 (*)    Creatinine, Ser 0.46 (*)    All other components within normal limits  TROPONIN I   Imaging Review Dg Chest 2 View  04/05/2013   CLINICAL DATA:  Chest pain.  Chest pressure. Short of breath.  EXAM: CHEST  2 VIEW  COMPARISON:  Multiple priors most recently 01/05/2013.  FINDINGS: Cardiopericardial silhouette and mediastinal contours are unchanged. Chronic bronchitic changes are present. There is no airspace disease or pleural effusion. No pneumothorax.  IMPRESSION: No acute cardiopulmonary disease are interval change. Chronic bronchitic changes.   Electronically Signed   By: Andreas Newport M.D.   On: 04/05/2013 22:48    EKG  Interpretation     Ventricular Rate:  82 PR Interval:  146 QRS Duration: 84 QT Interval:  406 QTC Calculation: 474 R Axis:   74 Text Interpretation:  Normal sinus rhythm Normal ECG When compared with ECG of 27-Jun-2012 17:36, Nonspecific T wave abnormality NO LONGER PRESENT            MDM  1. Dyspnea     Pt with atypical symptoms ongoing for several days with negative labs and imaging here. She has no objective signs of dyspnea. She mentions that her doctor made some medications adjustments recently including changing from Ativan to Klonopin and adding Prozac which she thinks may be contributing to her symptoms. Advised follow up with her doctor for further eval if symptoms persist.  I personally performed the services described in this documentation, which was scribed in my presence. The recorded information has been reviewed and is accurate.        Charles B. Bernette Mayers, MD 04/05/13 2311

## 2013-04-05 NOTE — ED Notes (Signed)
Pt presents with c/o fatigue, and generalized weakness x 1 week. Pt reports just wants to sleep all the time. No respiratory distress noted. NAD noted.

## 2013-04-22 ENCOUNTER — Ambulatory Visit: Payer: Self-pay | Admitting: Gastroenterology

## 2013-05-02 ENCOUNTER — Telehealth: Payer: Self-pay

## 2013-05-02 NOTE — Telephone Encounter (Signed)
Called. Many rings and no answer. Samples of the creon 24000 units ( # 36) at front. Pt has had many no shows. Last ov in May 2014. Needs to schedule appt and make sure to keep it.

## 2013-05-02 NOTE — Telephone Encounter (Signed)
Pt left VM requesting samples of Creon. Said she missed her last appt on 04/22/2013 because she did not have the money to come. Please advise!

## 2013-05-02 NOTE — Telephone Encounter (Signed)
PLEASE CALL PT. She needs to keep her appt 2x/ year inorder to continue to receive samples/assistance from our office.

## 2013-05-08 NOTE — Telephone Encounter (Signed)
Letter mailed to pt to inform her she needs to do her appts here twice a year to gets samples/assistance.

## 2013-05-14 ENCOUNTER — Telehealth: Payer: Self-pay | Admitting: *Deleted

## 2013-05-14 NOTE — Telephone Encounter (Signed)
Routing to Dr. Fields.  

## 2013-05-14 NOTE — Telephone Encounter (Signed)
Pt called wanting to see about getting her cheron refilled, pt states she has missed some appointments she has been really depressed. Please advise [phone]

## 2013-05-15 MED ORDER — PANCRELIPASE (LIP-PROT-AMYL) 24000-76000 UNITS PO CPEP: ORAL_CAPSULE | ORAL | Status: DC

## 2013-05-15 NOTE — Addendum Note (Signed)
Addended by: [place] on: 05/15/2013 08:49 AM   Modules accepted: Orders

## 2013-05-15 NOTE — Telephone Encounter (Signed)
PLEASE CALL PT. She needs to keep her appt 2x/year in order to continue to receive samples/assistance from our office. Refill creon X3. OPV IN 2-3 MOS E30 CHRONIC PANCREATITIS.

## 2013-05-15 NOTE — Telephone Encounter (Signed)
Called and informed pt.  

## 2013-10-24 ENCOUNTER — Telehealth: Payer: Self-pay | Admitting: Gastroenterology

## 2013-10-24 NOTE — Telephone Encounter (Signed)
Patient called today to say that she needed to follow up with SF and is aware of OV on 6/18 at 0930 with SF and then said that she needed pain medication. I told her that I didn't think SF would write her anything for pain since she hadn't been seen here in awhile. She said that she didn't want to go to PCP and that SF had written her rx before. I told her that she needed to keep her OV to follow up with SF and I would let SF be aware of her needing pain meds. She uses [facility] in [place]. Please advise [phone]

## 2013-10-28 NOTE — Telephone Encounter (Signed)
REVIEWED. AGREE. 

## 2013-10-28 NOTE — Telephone Encounter (Signed)
I called and left message on VM for a return call. I told her that Dr. Darrick PennaFields does not prescribe pain meds and also it has been over a year since she had an OV. ( Last ov on 10/18/2012).

## 2013-11-07 ENCOUNTER — Encounter (HOSPITAL_COMMUNITY): Payer: Self-pay | Admitting: Emergency Medicine

## 2013-11-07 ENCOUNTER — Emergency Department (HOSPITAL_COMMUNITY)
Admission: EM | Admit: 2013-11-07 | Discharge: 2013-11-07 | Disposition: A | Discharge status: Home / Self Care | Payer: Self-pay | Attending: Emergency Medicine | Admitting: Emergency Medicine

## 2013-11-07 DIAGNOSIS — Z3202 Encounter for pregnancy test, result negative: Secondary | ICD-10-CM | POA: Insufficient documentation

## 2013-11-07 DIAGNOSIS — Z79899 Other long term (current) drug therapy: Secondary | ICD-10-CM | POA: Insufficient documentation

## 2013-11-07 DIAGNOSIS — F3289 Other specified depressive episodes: Secondary | ICD-10-CM | POA: Insufficient documentation

## 2013-11-07 DIAGNOSIS — R110 Nausea: Secondary | ICD-10-CM | POA: Insufficient documentation

## 2013-11-07 DIAGNOSIS — N949 Unspecified condition associated with female genital organs and menstrual cycle: Secondary | ICD-10-CM | POA: Insufficient documentation

## 2013-11-07 DIAGNOSIS — R000 Tachycardia, unspecified: Secondary | ICD-10-CM | POA: Insufficient documentation

## 2013-11-07 DIAGNOSIS — K859 Acute pancreatitis without necrosis or infection, unspecified: Secondary | ICD-10-CM | POA: Insufficient documentation

## 2013-11-07 DIAGNOSIS — F329 Major depressive disorder, single episode, unspecified: Secondary | ICD-10-CM | POA: Insufficient documentation

## 2013-11-07 DIAGNOSIS — F17200 Nicotine dependence, unspecified, uncomplicated: Secondary | ICD-10-CM | POA: Insufficient documentation

## 2013-11-07 DIAGNOSIS — R1084 Generalized abdominal pain: Secondary | ICD-10-CM | POA: Insufficient documentation

## 2013-11-07 DIAGNOSIS — F410 Panic disorder [episodic paroxysmal anxiety] without agoraphobia: Secondary | ICD-10-CM | POA: Insufficient documentation

## 2013-11-07 DIAGNOSIS — Z8619 Personal history of other infectious and parasitic diseases: Secondary | ICD-10-CM | POA: Insufficient documentation

## 2013-11-07 DIAGNOSIS — Z872 Personal history of diseases of the skin and subcutaneous tissue: Secondary | ICD-10-CM | POA: Insufficient documentation

## 2013-11-07 DIAGNOSIS — R109 Unspecified abdominal pain: Secondary | ICD-10-CM

## 2013-11-07 LAB — URINALYSIS, ROUTINE W REFLEX MICROSCOPIC
Bilirubin Urine: NEGATIVE
GLUCOSE, UA: NEGATIVE mg/dL
Hgb urine dipstick: NEGATIVE
Ketones, ur: NEGATIVE mg/dL
LEUKOCYTES UA: NEGATIVE
Nitrite: NEGATIVE
PH: 6 (ref 5.0–8.0)
PROTEIN: NEGATIVE mg/dL
Specific Gravity, Urine: <1.005 — ABNORMAL LOW (ref 1.005–1.030)
Urobilinogen, UA: 0.2 mg/dL (ref 0.0–1.0)

## 2013-11-07 LAB — COMPREHENSIVE METABOLIC PANEL
ALT: 14 U/L (ref 0–35)
AST: 30 U/L (ref 0–37)
Albumin: 4 g/dL (ref 3.5–5.2)
Alkaline Phosphatase: 111 U/L (ref 39–117)
BUN: 4 mg/dL — ABNORMAL LOW (ref 6–23)
CALCIUM: 8.7 mg/dL (ref 8.4–10.5)
CO2: 27 meq/L (ref 19–32)
CREATININE: 0.56 mg/dL (ref 0.50–1.10)
Chloride: 100 mEq/L (ref 96–112)
GLUCOSE: 116 mg/dL — AB (ref 70–99)
Potassium: 3.8 mEq/L (ref 3.7–5.3)
Sodium: 140 mEq/L (ref 137–147)
TOTAL PROTEIN: 7.3 g/dL (ref 6.0–8.3)
Total Bilirubin: 0.2 mg/dL — ABNORMAL LOW (ref 0.3–1.2)

## 2013-11-07 LAB — CBC WITH DIFFERENTIAL/PLATELET
Basophils Absolute: 0 10*3/uL (ref 0.0–0.1)
Basophils Relative: 1 % (ref 0–1)
EOS ABS: 0.3 10*3/uL (ref 0.0–0.7)
EOS PCT: 4 % (ref 0–5)
HEMATOCRIT: 39.1 % (ref 36.0–46.0)
Hemoglobin: 13.1 g/dL (ref 12.0–15.0)
LYMPHS ABS: 1.2 10*3/uL (ref 0.7–4.0)
LYMPHS PCT: 19 % (ref 12–46)
MCH: 31.9 pg (ref 26.0–34.0)
MCHC: 33.5 g/dL (ref 30.0–36.0)
MCV: 95.1 fL (ref 78.0–100.0)
MONO ABS: 0.6 10*3/uL (ref 0.1–1.0)
Monocytes Relative: 9 % (ref 3–12)
Neutro Abs: 4.2 10*3/uL (ref 1.7–7.7)
Neutrophils Relative %: 67 % (ref 43–77)
PLATELETS: 238 10*3/uL (ref 150–400)
RBC: 4.11 MIL/uL (ref 3.87–5.11)
RDW: 13.9 % (ref 11.5–15.5)
WBC: 6.2 10*3/uL (ref 4.0–10.5)

## 2013-11-07 LAB — PREGNANCY, URINE: Preg Test, Ur: NEGATIVE

## 2013-11-07 LAB — LIPASE, BLOOD: LIPASE: 16 U/L (ref 11–59)

## 2013-11-07 MED ORDER — HYDROMORPHONE HCL PF 2 MG/ML IJ SOLN
2.0000 mg | Freq: Once | INTRAMUSCULAR | Status: AC
  Administered 2013-11-07: 2 mg via INTRAMUSCULAR
  Filled 2013-11-07: qty 1

## 2013-11-07 MED ORDER — ONDANSETRON 8 MG PO TBDP
8.0000 mg | ORAL_TABLET | Freq: Once | ORAL | Status: AC
  Administered 2013-11-07: 8 mg via ORAL
  Filled 2013-11-07: qty 1

## 2013-11-07 MED ORDER — HYDROMORPHONE HCL PF 1 MG/ML IJ SOLN
1.0000 mg | Freq: Once | INTRAMUSCULAR | Status: AC
  Administered 2013-11-07: 1 mg via INTRAMUSCULAR
  Filled 2013-11-07: qty 1

## 2013-11-07 NOTE — ED Provider Notes (Signed)
CSN: [id number]     Arrival date & time 11/07/13  1853 History   First MD Initiated Contact with Patient 11/07/13 2027 This chart was scribed for Gilda Crease, * by Valera Castle, ED Scribe. This patient was seen in room APA10/APA10 and the patient's care was started at 8:35 PM.     Chief Complaint  Patient presents with  . Abdominal Pain    (Consider location/radiation/quality/duration/timing/severity/associated sxs/prior Treatment) The history is provided by the patient. No language interpreter was used.   HPI Comments: Monique Nguyen is a 43 y.o. female with h/o pancreatitis, who presents to the Emergency Department complaining of constant, bilateral, lower abdominal pain as well as pelvic pain, that acutely worsened earlier today. 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 She reports associated constant nausea. She denies vomiting, and any other associated symptoms.   PCP - Kirk Ruths, MD  Past Medical History  Diagnosis Date  . GERD (gastroesophageal reflux disease)   . Bacterial vaginosis   . Anxiety   . Depression   . Panic attacks   . Psoriasis   . IBS (irritable bowel syndrome)   . Pancreatitis     ETOH   Past Surgical History  Procedure Laterality Date  . Bladder surgery      X 3  . Eus N/A 10/11/2012    Procedure: UPPER ENDOSCOPIC ULTRASOUND (EUS) LINEAR;  Surgeon: Rachael Fee, MD;  Location: [facility];  Service: Endoscopy;  Laterality: N/A;   Family History  Problem Relation Age of Onset  . Depression Mother     living  . Pancreatic cancer Father     living  . Colon cancer Neg Hx   . Colon polyps Neg Hx    History  Substance Use Topics  . Smoking status: Current Every Day Smoker -- 1.50 packs/day for 8 years    Types: Cigarettes  . Smokeless tobacco: Never Used  . Alcohol Use: No     Comment: states she used to drink 12-15 cans of beer per day, stopped beer in Nov 2013, then started  wine, last ETOH at all was in Jan 2014   OB History   Grav Para Term Preterm Abortions TAB SAB Ect Mult Living                 Review of Systems  Constitutional: Negative for fever.  Gastrointestinal: Positive for nausea and abdominal pain (bilateral, lower). Negative for vomiting.  Genitourinary: Positive for pelvic pain.    Allergies  Doxycycline and Sulfa antibiotics  Home Medications   Prior to Admission medications   Medication Sig Start Date End Date Taking? Authorizing Provider  albuterol (PROVENTIL HFA;VENTOLIN HFA) 108 (90 BASE) MCG/ACT inhaler Inhale 1-2 puffs into the lungs every 6 (six) hours as needed for wheezing. 01/05/13   Enid Skeens, MD  FLUoxetine (PROZAC) 20 MG capsule Take 20 mg by mouth at bedtime.    Historical Provider, MD  HYDROcodone-acetaminophen (NORCO/VICODIN) 5-325 MG per tablet Take 1 tablet by mouth every 4 (four) hours as needed. 09/12/12   Gwenyth Bender, NP  Pancrelipase, Lip-Prot-Amyl, 24000 UNITS CPEP 2 WITH MEALS AND 1 WITH SNACKS 05/15/13   West Bali, MD  promethazine (PHENERGAN) 25 MG tablet Take 12.5-25 mg by mouth every 6 (six) hours as needed for nausea.    Historical Provider, MD   BP 113/82  Pulse 104  Temp(Src) 98.4 F (36.9 C) (Oral)  Resp 20  Ht 5\' 1"  (1.549 m)  Wt 118 lb 1.6 oz (53.57 kg)  BMI 22.33 kg/m2  SpO2 97% Physical Exam  Constitutional: She is oriented to person, place, and time. She appears well-developed and well-nourished. No distress.  HENT:  Head: Normocephalic and atraumatic.  Right Ear: Hearing normal.  Left Ear: Hearing normal.  Mouth/Throat: Mucous membranes are normal.  Eyes: Conjunctivae and EOM are normal. Pupils are equal, round, and reactive to light.  Neck: Normal range of motion. Neck supple.  Cardiovascular: Normal rate, S1 normal and S2 normal.   Pulmonary/Chest: Effort normal. No respiratory distress. She exhibits no tenderness.  Abdominal: Soft. Normal appearance and bowel sounds are  normal. There is no hepatosplenomegaly. There is tenderness. There is no rebound, no guarding, no tenderness at McBurney's point and negative Murphy's sign. No hernia.  Musculoskeletal: Normal range of motion.  Neurological: She is alert and oriented to person, place, and time. She has normal strength. No cranial nerve deficit or sensory deficit. Coordination normal. GCS eye subscore is 4. GCS verbal subscore is 5. GCS motor subscore is 6.  Skin: Skin is warm, dry and intact. No rash noted. No cyanosis.  Psychiatric: She has a normal mood and affect. Her speech is normal and behavior is normal. Thought content normal.    ED Course  Procedures (including critical care time)  DIAGNOSTIC STUDIES: Oxygen Saturation is 97% on room air, normal by my interpretation.    COORDINATION OF CARE: 8:39 PM-Discussed treatment plan which includes blood work and UA with pt at bedside and pt agreed to plan.   Results for orders placed during the hospital encounter of 11/07/13  URINALYSIS, ROUTINE W REFLEX MICROSCOPIC      Result Value Ref Range   Color, Urine YELLOW  YELLOW   APPearance CLEAR  CLEAR   Specific Gravity, Urine <1.005 (*) 1.005 - 1.030   pH 6.0  5.0 - 8.0   Glucose, UA NEGATIVE  NEGATIVE mg/dL   Hgb urine dipstick NEGATIVE  NEGATIVE   Bilirubin Urine NEGATIVE  NEGATIVE   Ketones, ur NEGATIVE  NEGATIVE mg/dL   Protein, ur NEGATIVE  NEGATIVE mg/dL   Urobilinogen, UA 0.2  0.0 - 1.0 mg/dL   Nitrite NEGATIVE  NEGATIVE   Leukocytes, UA NEGATIVE  NEGATIVE  PREGNANCY, URINE      Result Value Ref Range   Preg Test, Ur NEGATIVE  NEGATIVE  CBC WITH DIFFERENTIAL      Result Value Ref Range   WBC 6.2  4.0 - 10.5 K/uL   RBC 4.11  3.87 - 5.11 MIL/uL   Hemoglobin 13.1  12.0 - 15.0 g/dL   HCT 01.0  93.2 - 35.5 %   MCV 95.1  78.0 - 100.0 fL   MCH 31.9  26.0 - 34.0 pg   MCHC 33.5  30.0 - 36.0 g/dL   RDW 73.2  20.2 - 54.2 %   Platelets 238  150 - 400 K/uL   Neutrophils Relative % 67  43 - 77 %    Neutro Abs 4.2  1.7 - 7.7 K/uL   Lymphocytes Relative 19  12 - 46 %   Lymphs Abs 1.2  0.7 - 4.0 K/uL   Monocytes Relative 9  3 - 12 %   Monocytes Absolute 0.6  0.1 - 1.0 K/uL   Eosinophils Relative 4  0 - 5 %   Eosinophils Absolute 0.3  0.0 - 0.7 K/uL   Basophils Relative 1  0 - 1 %   Basophils Absolute  0.0  0.0 - 0.1 K/uL  COMPREHENSIVE METABOLIC PANEL      Result Value Ref Range   Sodium 140  137 - 147 mEq/L   Potassium 3.8  3.7 - 5.3 mEq/L   Chloride 100  96 - 112 mEq/L   CO2 27  19 - 32 mEq/L   Glucose, Bld 116 (*) 70 - 99 mg/dL   BUN 4 (*) 6 - 23 mg/dL   Creatinine, Ser 1.610.56  0.50 - 1.10 mg/dL   Calcium 8.7  8.4 - 09.610.5 mg/dL   Total Protein 7.3  6.0 - 8.3 g/dL   Albumin 4.0  3.5 - 5.2 g/dL   AST 30  0 - 37 U/L   ALT 14  0 - 35 U/L   Alkaline Phosphatase 111  39 - 117 U/L   Total Bilirubin 0.2 (*) 0.3 - 1.2 mg/dL   GFR calc non Af Amer >90  >90 mL/min   GFR calc Af Amer >90  >90 mL/min  LIPASE, BLOOD      Result Value Ref Range   Lipase 16  11 - 59 U/L   No results found.   EKG Interpretation None     Medications  HYDROmorphone (DILAUDID) injection 1 mg (1 mg Intramuscular Given 11/07/13 2043)  ondansetron (ZOFRAN-ODT) disintegrating tablet 8 mg (8 mg Oral Given 11/07/13 2043)   MDM   Final diagnoses:  None    Patient presents to the ER for evaluation of abdominal pain. Patient reports nausea, diffuse upper abdominal pain. She reports a previous history of pancreatitis, this pain is similar. Patient's vital signs are stable except for very slight tachycardia, heart rate around 100. This was sent to her pain. Abdominal exam is benign. She very slight tenderness across upper abdomen without guarding or rebound. Lab work is normal, no signs of elevated lipase. No leukocytosis. This was afebrile. Patient has a normal urinalysis. She was administered analgesic only discharged followup with primary doctor.  I personally performed the services described in this  documentation, which was scribed in my presence. The recorded information has been reviewed and is accurate.     Gilda Creasehristopher J. Mannix Kroeker, MD 11/07/13 2122

## 2013-11-07 NOTE — ED Notes (Signed)
Having abdominal pain, history of pancreatitis per pt. Nauseated, no vomiting per pt.

## 2013-11-07 NOTE — Discharge Instructions (Signed)
Abdominal Pain, Adult °Many things can cause abdominal pain. Usually, abdominal pain is not caused by a disease and will improve without treatment. It can often be observed and treated at home. Your health care provider will do a physical exam and possibly order blood tests and X-rays to help determine the seriousness of your pain. However, in many cases, more time must pass before a clear cause of the pain can be found. Before that point, your health care provider may not know if you need more testing or further treatment. °HOME CARE INSTRUCTIONS  °Monitor your abdominal pain for any changes. The following actions may help to alleviate any discomfort you are experiencing: °· Only take over-the-counter or prescription medicines as directed by your health care provider. °· Do not take laxatives unless directed to do so by your health care provider. °· Try a clear liquid diet (broth, tea, or water) as directed by your health care provider. Slowly move to a bland diet as tolerated. °SEEK MEDICAL CARE IF: °· You have unexplained abdominal pain. °· You have abdominal pain associated with nausea or diarrhea. °· You have pain when you urinate or have a bowel movement. °· You experience abdominal pain that wakes you in the night. °· You have abdominal pain that is worsened or improved by eating food. °· You have abdominal pain that is worsened with eating fatty foods. °SEEK IMMEDIATE MEDICAL CARE IF:  °· Your pain does not go away within 2 hours. °· You have a fever. °· You keep throwing up (vomiting). °· Your pain is felt only in portions of the abdomen, such as the right side or the left lower portion of the abdomen. °· You pass bloody or black tarry stools. °MAKE SURE YOU: °· Understand these instructions.   °· Will watch your condition.   °· Will get help right away if you are not doing well or get worse.   °Document Released: 03/09/2005 Document Revised: 03/20/2013 Document Reviewed: 02/06/2013 °[employer]® Patient  Information ©2014 [employer]. ° °

## 2013-11-09 ENCOUNTER — Emergency Department (HOSPITAL_COMMUNITY)
Admission: EM | Admit: 2013-11-09 | Discharge: 2013-11-10 | Disposition: A | Discharge status: Home / Self Care | Payer: Self-pay | Attending: Emergency Medicine | Admitting: Emergency Medicine

## 2013-11-09 ENCOUNTER — Encounter (HOSPITAL_COMMUNITY): Payer: Self-pay | Admitting: Emergency Medicine

## 2013-11-09 DIAGNOSIS — Z872 Personal history of diseases of the skin and subcutaneous tissue: Secondary | ICD-10-CM | POA: Insufficient documentation

## 2013-11-09 DIAGNOSIS — Z79899 Other long term (current) drug therapy: Secondary | ICD-10-CM | POA: Insufficient documentation

## 2013-11-09 DIAGNOSIS — N760 Acute vaginitis: Secondary | ICD-10-CM | POA: Insufficient documentation

## 2013-11-09 DIAGNOSIS — B9689 Other specified bacterial agents as the cause of diseases classified elsewhere: Secondary | ICD-10-CM | POA: Insufficient documentation

## 2013-11-09 DIAGNOSIS — N949 Unspecified condition associated with female genital organs and menstrual cycle: Secondary | ICD-10-CM | POA: Insufficient documentation

## 2013-11-09 DIAGNOSIS — R110 Nausea: Secondary | ICD-10-CM | POA: Insufficient documentation

## 2013-11-09 DIAGNOSIS — Z8719 Personal history of other diseases of the digestive system: Secondary | ICD-10-CM | POA: Insufficient documentation

## 2013-11-09 DIAGNOSIS — R51 Headache: Secondary | ICD-10-CM | POA: Insufficient documentation

## 2013-11-09 DIAGNOSIS — F410 Panic disorder [episodic paroxysmal anxiety] without agoraphobia: Secondary | ICD-10-CM | POA: Insufficient documentation

## 2013-11-09 DIAGNOSIS — A499 Bacterial infection, unspecified: Secondary | ICD-10-CM | POA: Insufficient documentation

## 2013-11-09 DIAGNOSIS — R109 Unspecified abdominal pain: Secondary | ICD-10-CM

## 2013-11-09 DIAGNOSIS — F17200 Nicotine dependence, unspecified, uncomplicated: Secondary | ICD-10-CM | POA: Insufficient documentation

## 2013-11-09 DIAGNOSIS — F329 Major depressive disorder, single episode, unspecified: Secondary | ICD-10-CM | POA: Insufficient documentation

## 2013-11-09 DIAGNOSIS — Z791 Long term (current) use of non-steroidal anti-inflammatories (NSAID): Secondary | ICD-10-CM | POA: Insufficient documentation

## 2013-11-09 DIAGNOSIS — F3289 Other specified depressive episodes: Secondary | ICD-10-CM | POA: Insufficient documentation

## 2013-11-09 NOTE — ED Notes (Signed)
Patient with multiple complaints. Reports pelvic pain x 2 weeks. Patient also states "I believe my pancreas is acting up". Also complaining of headache, nausea, and sore throat.

## 2013-11-09 NOTE — ED Provider Notes (Signed)
CSN: [id number]     Arrival date & time 11/09/13  2144 History   First MD Initiated Contact with Patient 11/09/13 2256     Chief Complaint  Patient presents with  . Pelvic Pain     (Consider location/radiation/quality/duration/timing/severity/associated sxs/prior Treatment) HPI Comments: Patient is a 43 year old female who presents to the emergency department with pelvic pain and abdominal pain. The patient states that she has had pelvic area pain for 2 weeks it has been bad. She states that she has had pelvic pain for quite some time. She has noted some discharge and she is concerned for something" bad going on". Patient also states that she has some abdominal discomfort that she believes to be related to her chronic pancreatitis. She states that this is associated with nausea and with headache. She's not had any high fever. She's had some nausea but no actual vomiting. She's not had any changes in her stools. Patient presents to the emergency department now for assistance with these problems.  Patient is a 43 y.o. female presenting with pelvic pain. The history is provided by the patient.  Pelvic Pain Associated symptoms include abdominal pain, headaches and nausea. Pertinent negatives include no arthralgias, chest pain, coughing or neck pain.    Past Medical History  Diagnosis Date  . GERD (gastroesophageal reflux disease)   . Bacterial vaginosis   . Anxiety   . Depression   . Panic attacks   . Psoriasis   . IBS (irritable bowel syndrome)   . Pancreatitis     ETOH   Past Surgical History  Procedure Laterality Date  . Bladder surgery      X 3  . Eus N/A 10/11/2012    Procedure: UPPER ENDOSCOPIC ULTRASOUND (EUS) LINEAR;  Surgeon: Rachael Fee, MD;  Location: [facility];  Service: Endoscopy;  Laterality: N/A;   Family History  Problem Relation Age of Onset  . Depression Mother     living  . Pancreatic cancer Father     living  . Colon cancer Neg Hx   . Colon polyps Neg  Hx    History  Substance Use Topics  . Smoking status: Current Every Day Smoker -- 1.50 packs/day for 8 years    Types: Cigarettes  . Smokeless tobacco: Never Used  . Alcohol Use: No     Comment: states she used to drink 12-15 cans of beer per day, stopped beer in Nov 2013, then started wine, last ETOH at all was in Jan 2014   OB History   Grav Para Term Preterm Abortions TAB SAB Ect Mult Living                 Review of Systems  Constitutional: Negative for activity change.       All ROS Neg except as noted in HPI  HENT: Negative for nosebleeds.   Eyes: Negative for photophobia and discharge.  Respiratory: Negative for cough, shortness of breath and wheezing.   Cardiovascular: Negative for chest pain and palpitations.  Gastrointestinal: Positive for nausea and abdominal pain. Negative for blood in stool.  Genitourinary: Positive for pelvic pain. Negative for dysuria, frequency and hematuria.  Musculoskeletal: Negative for arthralgias, back pain and neck pain.  Skin: Negative.   Neurological: Positive for headaches. Negative for dizziness, seizures and speech difficulty.  Psychiatric/Behavioral: Negative for hallucinations and confusion.      Allergies  Doxycycline and Sulfa antibiotics  Home Medications   Prior to Admission medications   Medication Sig Start Date End  Date Taking? Authorizing Provider  ALPRAZolam Prudy Feeler) 0.5 MG tablet Take 0.5 mg by mouth every 6 (six) hours as needed for anxiety.   Yes Historical Provider, MD  HYDROcodone-acetaminophen (NORCO) 10-325 MG per tablet Take 1 tablet by mouth every 6 (six) hours as needed. pain   Yes Historical Provider, MD  Naproxen Sodium 220 MG CAPS Take 220 mg by mouth once.   Yes Historical Provider, MD  Pancrelipase, Lip-Prot-Amyl, 24000 UNITS CPEP Take 24,000-48,000 Units by mouth 3 (three) times daily. Patient takes 2 with meals and 1 with snack    Historical Provider, MD   BP 117/78  Pulse 80  Temp(Src) 98.8 F (37.1  C) (Oral)  Resp 16  Ht 5\' 1"  (1.549 m)  Wt 118 lb (53.524 kg)  BMI 22.31 kg/m2  SpO2 100% Physical Exam  Nursing note and vitals reviewed. Constitutional: She is oriented to person, place, and time. She appears well-developed and well-nourished.  Non-toxic appearance.  HENT:  Head: Normocephalic.  Right Ear: Tympanic membrane and external ear normal.  Left Ear: Tympanic membrane and external ear normal.  Eyes: EOM and lids are normal. Pupils are equal, round, and reactive to light.  Neck: Normal range of motion. Neck supple. Carotid bruit is not present.  Cardiovascular: Normal rate, regular rhythm, normal heart sounds, intact distal pulses and normal pulses.   Pulmonary/Chest: Breath sounds normal. No respiratory distress.  Abdominal: Soft. Bowel sounds are normal. There is no tenderness. There is no guarding.  Is soreness noted. Epigastric and right upper quadrant pain to palpation. No organomegaly appreciated. No distention noted.  Genitourinary:  Chaperone present during examination. External structures are well within normal limits. There is no foreign body or mass in the vaginal vault. The cervix appears healthy. There is minimal drainage from the os of the cervix. There is tenderness to wall motion of the cervix. There is no adnexal fullness or mass appreciated.  Musculoskeletal: Normal range of motion.  Lymphadenopathy:       Head (right side): No submandibular adenopathy present.       Head (left side): No submandibular adenopathy present.    She has no cervical adenopathy.  Neurological: She is alert and oriented to person, place, and time. She has normal strength. No cranial nerve deficit or sensory deficit.  Skin: Skin is warm and dry.  Psychiatric: She has a normal mood and affect. Her speech is normal.    ED Course  Procedures (including critical care time) Labs Review Labs Reviewed - No data to display  Imaging Review No results found.   EKG  Interpretation None      MDM I have reviewed the patient's previous emergency department visit. I have compared tonight's laboratory workup with the workup of May 28. No significant changes between the 20. Lipase tonight is normal at 25. Complete blood count shows no acute changes. Contents of metabolic panel shows potassium to be slightly low at 3.4, otherwise within normal limits. Urine analysis was nonacute. Wet prep shows moderate clue cells and moderate white blood cells otherwise negative.  Discuss all the findings with the patient in terms which she could understand. Patient has a bacterial vaginosis and chronic pancreatitis abdominal pain. Prescription for Flagyl, promethazine suppository, and Norco given to the patient. Patient advised to see a GYN specialist concerning her pain and discomfort as sone as possible. Patient acknowledges understanding of the discharge instructions.    Final diagnoses:  None    *I have reviewed nursing notes, vital signs, and  all appropriate lab and imaging results for this patient.Kathie Dike**   Kahli Mayon M Bryceton Hantz, PA-C 11/10/13 [phone]

## 2013-11-09 NOTE — ED Notes (Signed)
Pt is requesting pelvic exam from PA

## 2013-11-09 NOTE — ED Notes (Signed)
Patient reports "I feel faint"

## 2013-11-10 LAB — COMPREHENSIVE METABOLIC PANEL
ALBUMIN: 3.7 g/dL (ref 3.5–5.2)
ALK PHOS: 99 U/L (ref 39–117)
ALT: 12 U/L (ref 0–35)
AST: 19 U/L (ref 0–37)
BUN: 6 mg/dL (ref 6–23)
CO2: 27 mEq/L (ref 19–32)
CREATININE: 0.52 mg/dL (ref 0.50–1.10)
Calcium: 8.7 mg/dL (ref 8.4–10.5)
Chloride: 102 mEq/L (ref 96–112)
GFR calc Af Amer: >90 mL/min (ref >90)
GFR calc non Af Amer: >90 mL/min (ref >90)
Glucose, Bld: 97 mg/dL (ref 70–99)
POTASSIUM: 3.4 meq/L — AB (ref 3.7–5.3)
Sodium: 141 mEq/L (ref 137–147)
TOTAL PROTEIN: 6.6 g/dL (ref 6.0–8.3)
Total Bilirubin: 0.2 mg/dL — ABNORMAL LOW (ref 0.3–1.2)

## 2013-11-10 LAB — URINALYSIS, ROUTINE W REFLEX MICROSCOPIC
Bilirubin Urine: NEGATIVE
GLUCOSE, UA: NEGATIVE mg/dL
KETONES UR: NEGATIVE mg/dL
Leukocytes, UA: NEGATIVE
NITRITE: NEGATIVE
Protein, ur: NEGATIVE mg/dL
Specific Gravity, Urine: 1.01 (ref 1.005–1.030)
Urobilinogen, UA: 0.2 mg/dL (ref 0.0–1.0)
pH: 6.5 (ref 5.0–8.0)

## 2013-11-10 LAB — URINE MICROSCOPIC-ADD ON

## 2013-11-10 LAB — CBC WITH DIFFERENTIAL/PLATELET
BASOS ABS: 0 10*3/uL (ref 0.0–0.1)
BASOS PCT: 0 % (ref 0–1)
Eosinophils Absolute: 0.2 10*3/uL (ref 0.0–0.7)
Eosinophils Relative: 3 % (ref 0–5)
HCT: 36.4 % (ref 36.0–46.0)
HEMOGLOBIN: 12.4 g/dL (ref 12.0–15.0)
Lymphocytes Relative: 26 % (ref 12–46)
Lymphs Abs: 1.4 10*3/uL (ref 0.7–4.0)
MCH: 32.3 pg (ref 26.0–34.0)
MCHC: 34.1 g/dL (ref 30.0–36.0)
MCV: 94.8 fL (ref 78.0–100.0)
MONOS PCT: 10 % (ref 3–12)
Monocytes Absolute: 0.5 10*3/uL (ref 0.1–1.0)
Neutro Abs: 3.2 10*3/uL (ref 1.7–7.7)
Neutrophils Relative %: 61 % (ref 43–77)
Platelets: 206 10*3/uL (ref 150–400)
RBC: 3.84 MIL/uL — ABNORMAL LOW (ref 3.87–5.11)
RDW: 13.9 % (ref 11.5–15.5)
WBC: 5.3 10*3/uL (ref 4.0–10.5)

## 2013-11-10 LAB — WET PREP, GENITAL
TRICH WET PREP: NONE SEEN
Yeast Wet Prep HPF POC: NONE SEEN

## 2013-11-10 LAB — LIPASE, BLOOD: Lipase: 25 U/L (ref 11–59)

## 2013-11-10 MED ORDER — ONDANSETRON 4 MG PO TBDP
4.0000 mg | ORAL_TABLET | Freq: Once | ORAL | Status: AC
  Administered 2013-11-10: 4 mg via ORAL
  Filled 2013-11-10: qty 1

## 2013-11-10 MED ORDER — KETOROLAC TROMETHAMINE 60 MG/2ML IM SOLN
60.0000 mg | Freq: Once | INTRAMUSCULAR | Status: AC
  Administered 2013-11-10: 60 mg via INTRAMUSCULAR
  Filled 2013-11-10: qty 2

## 2013-11-10 MED ORDER — HYDROCODONE-ACETAMINOPHEN 5-325 MG PO TABS: 1.0000 | ORAL_TABLET | ORAL | Status: DC | PRN

## 2013-11-10 MED ORDER — METRONIDAZOLE 500 MG PO TABS: 500.0000 mg | ORAL_TABLET | Freq: Two times a day (BID) | ORAL | Status: DC

## 2013-11-10 MED ORDER — PROMETHAZINE HCL 25 MG RE SUPP: 25.0000 mg | Freq: Four times a day (QID) | RECTAL | Status: DC | PRN

## 2013-11-10 MED ORDER — HYDROMORPHONE HCL PF 1 MG/ML IJ SOLN
1.0000 mg | Freq: Once | INTRAMUSCULAR | Status: AC
  Administered 2013-11-10: 1 mg via INTRAMUSCULAR
  Filled 2013-11-10: qty 1

## 2013-11-10 NOTE — ED Provider Notes (Signed)
Medical screening examination/treatment/procedure(s) were performed by non-physician practitioner and as supervising physician I was immediately available for consultation/collaboration.   EKG Interpretation None        Enid Skeens, MD 11/10/13 [phone]

## 2013-11-10 NOTE — ED Notes (Signed)
Pt called requesting prescription for phenergan be changed from supp to po.  Notified Dr. Estell Harpin, ok to change to phenergan 25mg  po every 6 hours prn n/v, dispense 12.   Called prescription to [facility] in [place] per pt's request.

## 2013-11-10 NOTE — Discharge Instructions (Signed)
Your labs are unchanged from may 28. Your weight prep tests reveals a bacterial vaginosis. Please use Flagyl 2 times daily with food. Please do not use any form of alcohol while taking the Flagyl. Do not take Flagyl on an empty stomach. It is important that you see a GYN specialist concerning the remainder of your pelvic pain and discomfort. Pelvic Pain Pelvic pain is pain felt below the belly button and between your hips. It can be caused by many different things. It is important to get help right away. This is especially true for severe, sharp, or unusual pain that comes on suddenly.  HOME CARE  Only take medicine as told by your doctor.  Rest as told by your doctor.  Eat a healthy diet, such as fruits, vegetables, and lean meats.  Drink enough fluids to keep your pee (urine) clear or pale yellow, or as told.  Avoid sex (intercourse) if it causes pain.  Apply warm or cold packs to your lower belly (abdomen). Use the type of pack that helps the pain.  Avoid situations that cause you stress.  Keep a journal to track your pain. Write down:  When the pain started.  Where it is located.  If there are things that seem to be related to the pain, such as food or your period.  Follow up with your doctor as told. GET HELP RIGHT AWAY IF:   You have heavy bleeding from the vagina.  You have more pelvic pain.  You feel lightheaded or pass out (faint).  You have chills.  You have pain when you pee or have blood in your pee.  You cannot stop having watery poop (diarrhea).  You cannot stop throwing up (vomiting).  You have a fever or lasting symptoms for more than 3 days.  You have a fever and your symptoms suddenly get worse.  You are being physically or sexually abused.  Your medicine does not help your pain.  You have fluid (discharge) coming from your vagina that is not normal. MAKE SURE YOU:  Understand these instructions.  Will watch your condition.  Will get help  if you are not doing well or get worse. Document Released: 11/16/2007 Document Revised: 11/29/2011 Document Reviewed: 09/19/2011 [facility] Patient Information 2014 [place], [place].  Bacterial Vaginosis Bacterial vaginosis is an infection of the vagina. It happens when too many of certain germs (bacteria) grow in the vagina. HOME CARE  Take your medicine as told by your doctor.  Finish your medicine even if you start to feel better.  Do not have sex until you finish your medicine and are better.  Tell your sex partner that you have an infection. They should see their doctor for treatment.  Practice safe sex. Use condoms. Have only one sex partner. GET HELP IF:  You are not getting better after 3 days of treatment.  You have more grey fluid (discharge) coming from your vagina than before.  You have more pain than before.  You have a fever. MAKE SURE YOU:   Understand these instructions.  Will watch your condition.  Will get help right away if you are not doing well or get worse. Document Released: 03/08/2008 Document Revised: 03/20/2013 Document Reviewed: 01/09/2013 [facility] Patient Information 2014 [place], [place].

## 2013-11-11 ENCOUNTER — Telehealth: Payer: Self-pay

## 2013-11-11 LAB — GC/CHLAMYDIA PROBE AMP
CT Probe RNA: NEGATIVE
GC PROBE AMP APTIMA: NEGATIVE

## 2013-11-11 NOTE — Telephone Encounter (Signed)
Pt has been to the ER twice in the last 4 days. The pain is in her abd area. She is wanting to know what she needs to do. Please advise the pain medication in not working. Her contact number [phone]

## 2013-11-12 NOTE — Telephone Encounter (Signed)
Pt called back. She said she has been to the ED twice in the last few days. She is still having abdominal pain on her left side across from her belly button, and also she has some pain on the right side right under her breast. She said that her pain now is an 8 or 9. I told her when pain is at that level we recommend the ED because there is nothing we can do over the phone to help her. She said she is taking Hydrocodone 10/325 ( but it looks like 5/325 in chart).   She is on Flagyl for vaginitis. But she is requesting Dr. Darrick Penna send her some nausea medication to the pharmacy.   She also said she never did get any Creon. Raynelle Fanning has paperwork where the pt assistance was sent in from July 2014.

## 2013-11-13 MED ORDER — ONDANSETRON HCL 4 MG PO TABS: 4.0000 mg | ORAL_TABLET | Freq: Three times a day (TID) | ORAL | Status: DC | PRN

## 2013-11-13 MED ORDER — PANCRELIPASE (LIP-PROT-AMYL) 24000-76000 UNITS PO CPEP
2.0000 | ORAL_CAPSULE | Freq: Three times a day (TID) | ORAL | Status: DC
Start: 2013-11-13 — End: 2013-11-28

## 2013-11-13 NOTE — Telephone Encounter (Signed)
Pt is aware of what AS said 

## 2013-11-13 NOTE — Telephone Encounter (Signed)
Pt is calling again today because no on has gotten back in touch with her. She would like to know something today if possible. She ask to speak to [facility] but I told her she was seeing patient but I could send her a note. Please advise

## 2013-11-13 NOTE — Telephone Encounter (Signed)
I have not seen this patient in at least 3 years, so I'm not as familiar with what has happened since then. Reviewed chart. Please let patient know I did not see this to respond until today as I was in the hospital seeing patients.   1. Needs to be on pancreatic enzymes. Come pick this up if she does not have any. Needs to fill out financial assistance forms for this if she hasn't already. Tale Creon 24,000 (2 capsules with meals and 1 with snacks).  2. Zofran sent to pharmacy.

## 2013-11-14 NOTE — Telephone Encounter (Signed)
Pt called and asked for samples of Creon and I put 2 boxes of 12 each 24000 units at front for her to take 2 with meals and one with snacks. Pt is aware of her appt on 11/28/2013.  Patient assistance also given for the Creon.

## 2013-11-19 NOTE — Telephone Encounter (Signed)
Pt called today. Said she picked up the Creon last Friday and started taking it. She has been on it for 4 days now.  She is having a lot of dry heaves, can't eat much except crackers and drink liquids.  She thinks maybe this is a side effect of the Creon. She has the Zofran for nausea.  Please advise!

## 2013-11-20 MED ORDER — ONDANSETRON HCL 4 MG PO TABS: 4.0000 mg | ORAL_TABLET | Freq: Three times a day (TID) | ORAL | Status: DC

## 2013-11-20 NOTE — Addendum Note (Signed)
Addended by: West Bali on: 11/20/2013 02:20 PM   Modules accepted: Orders

## 2013-11-20 NOTE — Telephone Encounter (Addendum)
PLEASE CALL PT. IF SHE IS HAVING SX RELATED TO CREON, SHE SHOULD STOP TAKING IT. SHE SHOULD USE ZOFRAN QAC AND HS FOR 5 DAYS. RX SENT. FOLLOW A LOW FAT DIET. OPV JUN 18.

## 2013-11-20 NOTE — Telephone Encounter (Signed)
Called and informed pt.  

## 2013-11-28 ENCOUNTER — Encounter: Payer: Self-pay | Admitting: Gastroenterology

## 2013-11-28 ENCOUNTER — Ambulatory Visit (INDEPENDENT_AMBULATORY_CARE_PROVIDER_SITE_OTHER): Payer: Self-pay | Admitting: Gastroenterology

## 2013-11-28 VITALS — BP 127/87 | HR 98 | Temp 98.3°F | Ht 61.0 in | Wt 128.0 lb

## 2013-11-28 DIAGNOSIS — K860 Alcohol-induced chronic pancreatitis: Secondary | ICD-10-CM

## 2013-11-28 DIAGNOSIS — K861 Other chronic pancreatitis: Secondary | ICD-10-CM

## 2013-11-28 MED ORDER — PANCRELIPASE (LIP-PROT-AMYL) 40000-136000 UNITS PO CPEP: ORAL_CAPSULE | ORAL | Status: DC

## 2013-11-28 NOTE — Progress Notes (Signed)
-----   Message -----  From: Rachael Feeaniel P Jacobs, MD  Sent: 11/28/2013 11:29 AM  To: Donata DuffPatty L Lewis, CMA, [place] BaliSandi L Fields, MD   Sandi,  The efficacy of Celiac Plexus block for chronic pancreatitis is pretty low (unlike CPN for cancer pain). I would not offer it to her, certainly unless she can stop drinking completely for at least several months. Ongoing etoh consumption will likely negate any small, potential gains from Celiac block.

## 2013-11-28 NOTE — Progress Notes (Signed)
[facility]

## 2013-11-28 NOTE — Progress Notes (Signed)
Subjective:    Patient ID: Monique Nguyen, female    DOB: 03/27/1971, 43 y.o.   MRN: [id number] Kirk RuthsMCGOUGH,WILLIAM M, MD  HPI CONSTANTLY HURTING IN UPPER ABDOMEN. CAN'T TOLERATE CREON. MAKES HER NAUSEATED. NAUSEA: ZOFRAN-HELPS SOMEWHAT.  BMs: WATERY MOST OF TIME: 2-3 DAILY. CAN HAVE NL FORMED STOOL. EATS WHAT SHE EATS. WHATEVER SHE CAN AFFORD TO GET. NO SLEEP IN 2 DAYS. STAYS REALLY DEPRESSED AND CRIES ALL THE TIME. LOOKING INTO A MH SPECIALIST. WANTS TO KNOW IF SHE CAN GO TO [facility].  CONCERNED ABOUT THE SWELLING IN HER ANKLES AND LOWER LEGS. LAST ETOH: 3 WEEKS AGO(2 BOTTLES/WINE COOLERS). NO NOCTURNAL STOOLS. SMOKING 1 PK/DAY.  PT DENIES FEVER, CHILLS, HEMATOCHEZIA, vomiting, melena, diarrhea, CHEST PAIN, SHORTNESS OF BREATH,  CHANGE IN BOWEL IN HABITS, constipation, problems swallowing, problems with sedation, OR heartburn or indigestion.   Past Medical History  Diagnosis Date  . GERD (gastroesophageal reflux disease)   . Bacterial vaginosis   . Anxiety   . Depression   . Panic attacks   . Psoriasis   . IBS (irritable bowel syndrome)   . Pancreatitis     ETOH   Past Surgical History  Procedure Laterality Date  . Bladder surgery      X 3  . Eus N/A 10/11/2012    Procedure: UPPER ENDOSCOPIC ULTRASOUND (EUS) LINEAR;  Surgeon: Rachael Feeaniel P Jacobs, MD;  Location: [facility];  Service: Endoscopy;  Laterality: N/A;    Allergies  Allergen Reactions  . Doxycycline Other (See Comments)    headaches  . Sulfa Antibiotics Nausea And Vomiting and Other (See Comments)    Blurred vision and headaches    Current Outpatient Prescriptions  Medication Sig Dispense Refill  . ALPRAZolam (XANAX) 0.5 MG tablet Take 0.5 mg by mouth every 6 (six) hours as needed for anxiety.      . Naproxen Sodium 220 MG CAPS Take 220 mg by mouth once.      . ondansetron (ZOFRAN) 4 MG tablet Take 1 tablet (4 mg total) by mouth 4 (four) times daily -  before meals and at bedtime.    Marland Kitchen. HYDROcodone-acetaminophen (NORCO)  10-325 MG per tablet Take 1 tablet by mouth every 6 (six) hours as needed. pain    . HYDROcodone-acetaminophen (NORCO/VICODIN) 5-325 MG per tablet Take 1 tablet by mouth every 4 (four) hours as needed for moderate pain.    . metroNIDAZOLE (FLAGYL) 500 MG tablet Take 1 tablet (500 mg total) by mouth 2 (two) times daily.    .      . promethazine (PHENERGAN) 25 MG suppository Place 1 suppository (25 mg total) rectally every 6 (six) hours as needed for nausea or vomiting.      Review of Systems PER HPI OTHERWISE ALL SYSTEMS ARE NEGATIVE.     Objective:   Physical Exam  Vitals reviewed. Constitutional: She is oriented to person, place, and time. She appears well-developed and well-nourished. No distress.  HENT:  Head: Normocephalic and atraumatic.  Mouth/Throat: Oropharynx is clear and moist. No oropharyngeal exudate.  Eyes: Pupils are equal, round, and reactive to light. No scleral icterus.  Neck: Normal range of motion. Neck supple.  Cardiovascular: Normal rate, regular rhythm and normal heart sounds.   Pulmonary/Chest: Effort normal and breath sounds normal. No respiratory distress.  Abdominal: Soft. Bowel sounds are normal. She exhibits no distension. There is tenderness. There is no rebound and no guarding.  MILD TTP IN THE EPIGASTRIUM & BUQS.   Musculoskeletal: She exhibits edema.  BILATERAL  PEDAL EDEMA, NO PRE-TIBIAL EDEMA  Lymphadenopathy:    She has no cervical adenopathy.  Neurological: She is alert and oriented to person, place, and time.  NO FOCAL DEFICITS   Psychiatric:  SLIGHTLY DEPRESSED MOOD, FLAT AFFECT           Assessment & Plan:

## 2013-11-28 NOTE — Progress Notes (Signed)
cc'd to pcp 

## 2013-11-28 NOTE — Progress Notes (Signed)
Referral has been made to Dr. Jacobs 

## 2013-11-28 NOTE — Progress Notes (Signed)
Referral has been made to Dr. Gerilyn Pilgrimoonquah for Pain Management

## 2013-11-28 NOTE — Assessment & Plan Note (Signed)
WITH CONTINUED TOBACCO/ETOH USE. DIARRHEA MOST LIKELY DUE TO PANCREATIC INSUFFICIENCY +/- IBS-D.  SX NOT IDEALLY CONTROLLED.   AVOID ETOH REDUCE TOBACCO LOW FAT DIET ADD ZENPEP 40 ONE WITH MEALS AND WITH SNACKS LIPASE/CT ABD WITHIN THE NEXT 7 DAYS PAIN MANAGEMENT REFERRAL ENCOURAGED PT TO SEEK MENTAL HEALTH SPECIALIST REFER TO DR. DAN JACOBS FOR CELIAC PLEXUS BLOCK REFER TO [facility] FOR EVALUATION OF PANCREATITIS PER PT REQUEST FOLLOW UP IN 4 MOS.

## 2013-11-28 NOTE — Patient Instructions (Signed)
TO REDUCE YOUR ABDOMINAL PAIN YOU NEED TO DO THE FOLLOWING:  1. AVOID ALCOHOL 2. STOP SMOKING 3. FOLLOW A LOW FAT DIET, SEE INFO BELOW. 4. ADD ZENPEP 40 ONE WITH MEALS AND WITH SNACKS 5. SEE PAIN MANAGEMENT 6. SEE A MENTAL HEALTH SPECIALIST 7. SEE DR. Christella HartiganJACOBS FOR A CELIAC PLEXUS BLOCK 8. SEE GI DOCS AT [facility] TO DISCUSS ADDITIONAL MANAGEMENT OF YOUR CHRONIC PANCREATITIS  FOR YOUR NAUSEA, CONTINUE TO USE PHENERGAN OR ZOFRAN AS NEEDED.  PLEASE CALL WITH QUESTIONS OR CONCERNS. FOLLOW UP IN 4 MOS.   Low-Fat Diet BREADS, CEREALS, PASTA, RICE, DRIED PEAS, AND BEANS These products are high in carbohydrates and most are low in fat. Therefore, they can be increased in the diet as substitutes for fatty foods. They too, however, contain calories and should not be eaten in excess. Cereals can be eaten for snacks as well as for breakfast.   FRUITS AND VEGETABLES It is good to eat fruits and vegetables. Besides being sources of fiber, both are rich in vitamins and some minerals. They help you get the daily allowances of these nutrients. Fruits and vegetables can be used for snacks and desserts.  MEATS Limit lean meat, chicken, [place], and fish to no more than 6 ounces per day. Beef, Pork, and Lamb Use lean cuts of beef, pork, and lamb. Lean cuts include:  Extra-lean ground beef.  Arm roast.  Sirloin tip.  Center-cut ham.  Round steak.  Loin chops.  Rump roast.  Tenderloin.  Trim all fat off the outside of meats before cooking. It is not necessary to severely decrease the intake of red meat, but lean choices should be made. Lean meat is rich in protein and contains a highly absorbable form of iron. Premenopausal women, in particular, should avoid reducing lean red meat because this could increase the risk for low red blood cells (iron-deficiency anemia).  Chicken and [place] These are good sources of protein. The fat of poultry can be reduced by removing the skin and underlying fat layers before  cooking. Chicken and [place] can be substituted for lean red meat in the diet. Poultry should not be fried or covered with high-fat sauces. Fish and Shellfish Fish is a good source of protein. Shellfish contain cholesterol, but they usually are low in saturated fatty acids. The preparation of fish is important. Like chicken and [place], they should not be fried or covered with high-fat sauces. EGGS Egg whites contain no fat or cholesterol. They can be eaten often. Try 1 to 2 egg whites instead of whole eggs in recipes or use egg substitutes that do not contain yolk. MILK AND DAIRY PRODUCTS Use skim or 1% milk instead of 2% or whole milk. Decrease whole milk, natural, and processed cheeses. Use nonfat or low-fat (2%) cottage cheese or low-fat cheeses made from vegetable oils. Choose nonfat or low-fat (1 to 2%) yogurt. Experiment with evaporated skim milk in recipes that call for heavy cream. Substitute low-fat yogurt or low-fat cottage cheese for sour cream in dips and salad dressings. Have at least 2 servings of low-fat dairy products, such as 2 glasses of skim (or 1%) milk each day to help get your daily calcium intake. FATS AND OILS Reduce the total intake of fats, especially saturated fat. Butterfat, lard, and beef fats are high in saturated fat and cholesterol. These should be avoided as much as possible. Vegetable fats do not contain cholesterol, but certain vegetable fats, such as coconut oil, palm oil, and palm kernel oil are  very high in saturated fats. These should be limited. These fats are often used in bakery goods, processed foods, popcorn, oils, and nondairy creamers. Vegetable shortenings and some peanut butters contain hydrogenated oils, which are also saturated fats. Read the labels on these foods and check for saturated vegetable oils. Unsaturated vegetable oils and fats do not raise blood cholesterol. However, they should be limited because they are fats and are high in calories. Total fat  should still be limited to 30% of your daily caloric intake. Desirable liquid vegetable oils are corn oil, cottonseed oil, olive oil, canola oil, safflower oil, soybean oil, and sunflower oil. Peanut oil is not as good, but small amounts are acceptable. Buy a heart-healthy tub margarine that has no partially hydrogenated oils in the ingredients. Mayonnaise and salad dressings often are made from unsaturated fats, but they should also be limited because of their high calorie and fat content. Seeds, nuts, peanut butter, olives, and avocados are high in fat, but the fat is mainly the unsaturated type. These foods should be limited mainly to avoid excess calories and fat. OTHER EATING TIPS Snacks  Most sweets should be limited as snacks. They tend to be rich in calories and fats, and their caloric content outweighs their nutritional value. Some good choices in snacks are graham crackers, melba toast, soda crackers, bagels (no egg), English muffins, fruits, and vegetables. These snacks are preferable to snack crackers, [place] fries, TORTILLA CHIPS, and POTATO chips. Popcorn should be air-popped or cooked in small amounts of liquid vegetable oil. Desserts Eat fruit, low-fat yogurt, and fruit ices instead of pastries, cake, and cookies. Sherbet, angel food cake, gelatin dessert, frozen low-fat yogurt, or other frozen products that do not contain saturated fat (pure fruit juice bars, frozen ice pops) are also acceptable.  COOKING METHODS Choose those methods that use little or no fat. They include: Poaching.  Braising.  Steaming.  Grilling.  Baking.  Stir-frying.  Broiling.  Microwaving.  Foods can be cooked in a nonstick pan without added fat, or use a nonfat cooking spray in regular cookware. Limit fried foods and avoid frying in saturated fat. Add moisture to lean meats by using water, broth, cooking wines, and other nonfat or low-fat sauces along with the cooking methods mentioned above. Soups and  stews should be chilled after cooking. The fat that forms on top after a few hours in the refrigerator should be skimmed off. When preparing meals, avoid using excess salt. Salt can contribute to raising blood pressure in some people.  EATING AWAY FROM HOME Order entres, potatoes, and vegetables without sauces or butter. When meat exceeds the size of a deck of cards (3 to 4 ounces), the rest can be taken home for another meal. Choose vegetable or fruit salads and ask for low-calorie salad dressings to be served on the side. Use dressings sparingly. Limit high-fat toppings, such as bacon, crumbled eggs, cheese, sunflower seeds, and olives. Ask for heart-healthy tub margarine instead of butter.

## 2013-11-29 LAB — LIPASE: Lipase: <10 U/L (ref 0–75)

## 2013-11-29 NOTE — Progress Notes (Signed)
PLEASE CALL PT. Her lipase is normal. LET HER KNOW DR. Christella HartiganJACOBS DIDN'T THINK A REPEAT EUS/CELIAC BLOCK WOULD IMPROVE HER PANCREAS PAIN.

## 2013-12-02 ENCOUNTER — Ambulatory Visit (HOSPITAL_COMMUNITY): Payer: Self-pay

## 2013-12-02 NOTE — Progress Notes (Signed)
Reminder in epic °

## 2013-12-04 NOTE — Progress Notes (Signed)
Called and informed pt.  

## 2013-12-05 ENCOUNTER — Ambulatory Visit (HOSPITAL_COMMUNITY): Payer: Self-pay

## 2013-12-07 ENCOUNTER — Encounter (HOSPITAL_COMMUNITY): Payer: Self-pay | Admitting: Emergency Medicine

## 2013-12-07 ENCOUNTER — Emergency Department (HOSPITAL_COMMUNITY)
Admission: EM | Admit: 2013-12-07 | Discharge: 2013-12-07 | Discharge status: Against Medical Advice | Payer: Self-pay | Attending: Emergency Medicine | Admitting: Emergency Medicine

## 2013-12-07 ENCOUNTER — Emergency Department (HOSPITAL_COMMUNITY): Payer: Self-pay

## 2013-12-07 DIAGNOSIS — F17200 Nicotine dependence, unspecified, uncomplicated: Secondary | ICD-10-CM | POA: Insufficient documentation

## 2013-12-07 DIAGNOSIS — Y92009 Unspecified place in unspecified non-institutional (private) residence as the place of occurrence of the external cause: Secondary | ICD-10-CM

## 2013-12-07 DIAGNOSIS — W010XXA Fall on same level from slipping, tripping and stumbling without subsequent striking against object, initial encounter: Secondary | ICD-10-CM | POA: Insufficient documentation

## 2013-12-07 DIAGNOSIS — S20229A Contusion of unspecified back wall of thorax, initial encounter: Secondary | ICD-10-CM | POA: Insufficient documentation

## 2013-12-07 DIAGNOSIS — F411 Generalized anxiety disorder: Secondary | ICD-10-CM | POA: Insufficient documentation

## 2013-12-07 DIAGNOSIS — F3289 Other specified depressive episodes: Secondary | ICD-10-CM | POA: Insufficient documentation

## 2013-12-07 DIAGNOSIS — Z79899 Other long term (current) drug therapy: Secondary | ICD-10-CM | POA: Insufficient documentation

## 2013-12-07 DIAGNOSIS — S300XXA Contusion of lower back and pelvis, initial encounter: Secondary | ICD-10-CM

## 2013-12-07 DIAGNOSIS — Z8719 Personal history of other diseases of the digestive system: Secondary | ICD-10-CM | POA: Insufficient documentation

## 2013-12-07 DIAGNOSIS — Z3202 Encounter for pregnancy test, result negative: Secondary | ICD-10-CM | POA: Insufficient documentation

## 2013-12-07 DIAGNOSIS — Z8742 Personal history of other diseases of the female genital tract: Secondary | ICD-10-CM | POA: Insufficient documentation

## 2013-12-07 DIAGNOSIS — Y929 Unspecified place or not applicable: Secondary | ICD-10-CM | POA: Insufficient documentation

## 2013-12-07 DIAGNOSIS — Y9389 Activity, other specified: Secondary | ICD-10-CM | POA: Insufficient documentation

## 2013-12-07 DIAGNOSIS — Z872 Personal history of diseases of the skin and subcutaneous tissue: Secondary | ICD-10-CM | POA: Insufficient documentation

## 2013-12-07 DIAGNOSIS — Z8619 Personal history of other infectious and parasitic diseases: Secondary | ICD-10-CM | POA: Insufficient documentation

## 2013-12-07 DIAGNOSIS — W19XXXA Unspecified fall, initial encounter: Secondary | ICD-10-CM

## 2013-12-07 DIAGNOSIS — F329 Major depressive disorder, single episode, unspecified: Secondary | ICD-10-CM | POA: Insufficient documentation

## 2013-12-07 DIAGNOSIS — Z791 Long term (current) use of non-steroidal anti-inflammatories (NSAID): Secondary | ICD-10-CM | POA: Insufficient documentation

## 2013-12-07 LAB — POC URINE PREG, ED: Preg Test, Ur: NEGATIVE

## 2013-12-07 NOTE — ED Notes (Signed)
Per EMS, patient was trying to move a table and slipped and fell backwards.  Patient c/o lower back pain.  Patient ambulatory to room.  Per EMS, patient refused LSB.

## 2013-12-07 NOTE — ED Notes (Signed)
Patient states she can't wait any longer; her mother is her ride and she won't wait for her.  Patient instructed on risks of leaving and benefits of staying.  Patient adamant that she is leaving.

## 2013-12-07 NOTE — ED Provider Notes (Signed)
CSN: [id number]     Arrival date & time 12/07/13  0221 History   First MD Initiated Contact with Patient 12/07/13 [phone]     Chief Complaint  Patient presents with  . Fall     (Consider location/radiation/quality/duration/timing/severity/associated sxs/prior Treatment) Patient is a 43 y.o. female presenting with fall. The history is provided by the patient.  Fall  She slipped and fell hitting the corner of the trunk with her lower back. She is complaining of moderate pain which she rates as 6/10. She denies other injury. Denies weakness, numbness, tingling. She has not taken anything for pain.  Past Medical History  Diagnosis Date  . GERD (gastroesophageal reflux disease)   . Bacterial vaginosis   . Anxiety   . Depression   . Panic attacks   . Psoriasis   . IBS (irritable bowel syndrome)   . Pancreatitis     ETOH   Past Surgical History  Procedure Laterality Date  . Bladder surgery      X 3  . Eus N/A 10/11/2012    Procedure: UPPER ENDOSCOPIC ULTRASOUND (EUS) LINEAR;  Surgeon: Rachael Feeaniel P Jacobs, MD;  Location: [facility];  Service: Endoscopy;  Laterality: N/A;   Family History  Problem Relation Age of Onset  . Depression Mother     living  . Pancreatic cancer Father     living  . Colon cancer Neg Hx   . Colon polyps Neg Hx    History  Substance Use Topics  . Smoking status: Current Every Day Smoker -- 1.00 packs/day for 8 years    Types: Cigarettes  . Smokeless tobacco: Never Used     Comment: Smokes one pack of cigarettes daily  . Alcohol Use: No     Comment: states she used to drink 12-15 cans of beer per day, stopped beer in Nov 2013, then started wine, last ETOH at all was in Jan 2014   OB History   Grav Para Term Preterm Abortions TAB SAB Ect Mult Living                 Review of Systems  All other systems reviewed and are negative.     Allergies  Doxycycline and Sulfa antibiotics  Home Medications   Prior to Admission medications   Medication Sig  Start Date End Date Taking? Authorizing Kriss Perleberg  ALPRAZolam Prudy Feeler(XANAX) 0.5 MG tablet Take 0.5 mg by mouth every 6 (six) hours as needed for anxiety.   Yes Historical Kaleab Frasier, MD  HYDROcodone-acetaminophen (NORCO/VICODIN) 5-325 MG per tablet Take 1 tablet by mouth every 4 (four) hours as needed for moderate pain. 11/10/13  Yes Kathie DikeHobson M Bryant, PA-C  Naproxen Sodium 220 MG CAPS Take 220 mg by mouth once.   Yes Historical Yannet Rincon, MD  ondansetron (ZOFRAN) 4 MG tablet Take 1 tablet (4 mg total) by mouth 4 (four) times daily -  before meals and at bedtime. 11/20/13  Yes [place]i L Fields, MD  Pancrelipase, Lip-Prot-Amyl, (ZENPEP) 40000 UNITS CPEP 1-2 WITH MEALS PO TID AND 1 WITH SNACKS 11/28/13  Yes [place]Sandi L Fields, MD  promethazine (PHENERGAN) 25 MG suppository Place 1 suppository (25 mg total) rectally every 6 (six) hours as needed for nausea or vomiting. 11/10/13  Yes Kathie DikeHobson M Bryant, PA-C   BP 113/82  Pulse 97  Temp(Src) 98.1 F (36.7 C) (Oral)  Resp 18  Ht 5\' 1"  (1.549 m)  Wt 129 lb (58.514 kg)  BMI 24.39 kg/m2  SpO2 100% Physical Exam  Nursing note and  vitals reviewed.  43 year old female, resting comfortably and in no acute distress. Vital signs are normal. Oxygen saturation is 100%, which is normal. Head is normocephalic and atraumatic. PERRLA, EOMI. Oropharynx is clear. Neck is nontender and supple without adenopathy or JVD. Back he has mild to moderate tenderness in the upper lumbar area. There is no CVA tenderness. Lungs are clear without rales, wheezes, or rhonchi. Chest is nontender. Heart has regular rate and rhythm without murmur. Abdomen is soft, flat, nontender without masses or hepatosplenomegaly and peristalsis is normoactive. Extremities have no cyanosis or edema, full range of motion is present. Skin is warm and dry without rash. Neurologic: Mental status is normal, cranial nerves are intact, there are no motor or sensory deficits.  ED Course  Procedures (including critical  care time)  Imaging Review Dg Lumbar Spine Complete  12/07/2013   CLINICAL DATA:  Right greater than left low back pain 1 day after fall  EXAM: LUMBAR SPINE - COMPLETE 4+ VIEW  COMPARISON:  Abdominal CT 09/10/2012  FINDINGS: There is no evidence of lumbar spine fracture. Alignment is normal. Minimal endplate spurring; intervertebral disc spaces are maintained.  IMPRESSION: Negative.   Electronically Signed   By: Tiburcio PeaJonathan  Watts M.D.   On: 12/07/2013 03:57    MDM   Final diagnoses:  Fall at home, initial encounter  Contusion of lower back, initial encounter    Lumbar contusion. X-rays will be obtained to rule out fracture.  X-rays are negative for fracture. However, the patient left the ED upon returning from x-ray before I could discuss results with her.  Dione Boozeavid Glick, MD 12/07/13 [phone]

## 2013-12-18 ENCOUNTER — Emergency Department (HOSPITAL_COMMUNITY)
Admission: EM | Admit: 2013-12-18 | Discharge: 2013-12-19 | Disposition: A | Discharge status: Home / Self Care | Payer: Self-pay | Attending: Emergency Medicine | Admitting: Emergency Medicine

## 2013-12-18 ENCOUNTER — Encounter (HOSPITAL_COMMUNITY): Payer: Self-pay | Admitting: Emergency Medicine

## 2013-12-18 DIAGNOSIS — Z792 Long term (current) use of antibiotics: Secondary | ICD-10-CM | POA: Insufficient documentation

## 2013-12-18 DIAGNOSIS — F3289 Other specified depressive episodes: Secondary | ICD-10-CM | POA: Insufficient documentation

## 2013-12-18 DIAGNOSIS — L03319 Cellulitis of trunk, unspecified: Principal | ICD-10-CM

## 2013-12-18 DIAGNOSIS — Z8742 Personal history of other diseases of the female genital tract: Secondary | ICD-10-CM | POA: Insufficient documentation

## 2013-12-18 DIAGNOSIS — L03313 Cellulitis of chest wall: Secondary | ICD-10-CM

## 2013-12-18 DIAGNOSIS — Z8719 Personal history of other diseases of the digestive system: Secondary | ICD-10-CM | POA: Insufficient documentation

## 2013-12-18 DIAGNOSIS — Z79899 Other long term (current) drug therapy: Secondary | ICD-10-CM | POA: Insufficient documentation

## 2013-12-18 DIAGNOSIS — Z8619 Personal history of other infectious and parasitic diseases: Secondary | ICD-10-CM | POA: Insufficient documentation

## 2013-12-18 DIAGNOSIS — F17200 Nicotine dependence, unspecified, uncomplicated: Secondary | ICD-10-CM | POA: Insufficient documentation

## 2013-12-18 DIAGNOSIS — R1084 Generalized abdominal pain: Secondary | ICD-10-CM | POA: Insufficient documentation

## 2013-12-18 DIAGNOSIS — Z9889 Other specified postprocedural states: Secondary | ICD-10-CM | POA: Insufficient documentation

## 2013-12-18 DIAGNOSIS — F329 Major depressive disorder, single episode, unspecified: Secondary | ICD-10-CM | POA: Insufficient documentation

## 2013-12-18 DIAGNOSIS — E876 Hypokalemia: Secondary | ICD-10-CM | POA: Insufficient documentation

## 2013-12-18 DIAGNOSIS — F411 Generalized anxiety disorder: Secondary | ICD-10-CM | POA: Insufficient documentation

## 2013-12-18 DIAGNOSIS — L02219 Cutaneous abscess of trunk, unspecified: Secondary | ICD-10-CM | POA: Insufficient documentation

## 2013-12-18 LAB — CBC WITH DIFFERENTIAL/PLATELET
BASOS ABS: 0 10*3/uL (ref 0.0–0.1)
Basophils Relative: 0 % (ref 0–1)
Eosinophils Absolute: 0.1 10*3/uL (ref 0.0–0.7)
Eosinophils Relative: 1 % (ref 0–5)
HEMATOCRIT: 35.9 % — AB (ref 36.0–46.0)
HEMOGLOBIN: 12.2 g/dL (ref 12.0–15.0)
LYMPHS PCT: 9 % — AB (ref 12–46)
Lymphs Abs: 0.9 10*3/uL (ref 0.7–4.0)
MCH: 32.1 pg (ref 26.0–34.0)
MCHC: 34 g/dL (ref 30.0–36.0)
MCV: 94.5 fL (ref 78.0–100.0)
MONO ABS: 0.8 10*3/uL (ref 0.1–1.0)
MONOS PCT: 8 % (ref 3–12)
NEUTROS ABS: 8 10*3/uL — AB (ref 1.7–7.7)
Neutrophils Relative %: 82 % — ABNORMAL HIGH (ref 43–77)
Platelets: 193 10*3/uL (ref 150–400)
RBC: 3.8 MIL/uL — ABNORMAL LOW (ref 3.87–5.11)
RDW: 13.5 % (ref 11.5–15.5)
WBC: 9.8 10*3/uL (ref 4.0–10.5)

## 2013-12-18 MED ORDER — ONDANSETRON 4 MG PO TBDP
4.0000 mg | ORAL_TABLET | Freq: Once | ORAL | Status: AC
  Administered 2013-12-18: 4 mg via ORAL
  Filled 2013-12-18: qty 1

## 2013-12-18 MED ORDER — LIDOCAINE HCL (PF) 1 % IJ SOLN
INTRAMUSCULAR | Status: AC
  Administered 2013-12-18: 2.1 mL
  Filled 2013-12-18: qty 5

## 2013-12-18 MED ORDER — CEFTRIAXONE SODIUM 1 G IJ SOLR
1.0000 g | Freq: Once | INTRAMUSCULAR | Status: AC
  Administered 2013-12-18: 1 g via INTRAMUSCULAR
  Filled 2013-12-18: qty 10

## 2013-12-18 MED ORDER — HYDROMORPHONE HCL PF 1 MG/ML IJ SOLN
1.0000 mg | Freq: Once | INTRAMUSCULAR | Status: AC
  Administered 2013-12-18: 1 mg via INTRAMUSCULAR
  Filled 2013-12-18: qty 1

## 2013-12-18 NOTE — ED Provider Notes (Signed)
CSN: [id number]     Arrival date & time 12/18/13  2056 History  This chart was scribed for Rolland PorterMark Ravynn Hogate, MD by Quintella ReichertMatthew Underwood, ED scribe.  This patient was seen in room APA04/APA04 and the patient's care was started at 11:18 PM.   Chief Complaint  Patient presents with  . Abscess  . Pancreatitis    The history is provided by the patient. No language interpreter was used.    HPI Comments: Monique Nguyen is a 43 y.o. female with h/o pancreatitis and IBS who presents to the Emergency Department complaining of severe abdominal pain that began today.  Pt attributes her pain to a flare-up of her chronic alcoholic pancreatitis which has been ongoing for some time.  She does not drink anymore.  GI is Dr. Darrick PennaFields and she is trying to get in with pain management.  Pt also complains of a persistent, progressively-worsening, painful "knot" under her left arm.  She reports associated swelling and redness to the area.    Past Medical History  Diagnosis Date  . GERD (gastroesophageal reflux disease)   . Bacterial vaginosis   . Anxiety   . Depression   . Panic attacks   . Psoriasis   . IBS (irritable bowel syndrome)   . Pancreatitis     ETOH    Past Surgical History  Procedure Laterality Date  . Bladder surgery      X 3  . Eus N/A 10/11/2012    Procedure: UPPER ENDOSCOPIC ULTRASOUND (EUS) LINEAR;  Surgeon: Rachael Feeaniel P Jacobs, MD;  Location: [facility];  Service: Endoscopy;  Laterality: N/A;    Family History  Problem Relation Age of Onset  . Depression Mother     living  . Pancreatic cancer Father     living  . Colon cancer Neg Hx   . Colon polyps Neg Hx     History  Substance Use Topics  . Smoking status: Current Every Day Smoker -- 1.00 packs/day for 8 years    Types: Cigarettes  . Smokeless tobacco: Never Used     Comment: Smokes one pack of cigarettes daily  . Alcohol Use: No     Comment: states she used to drink 12-15 cans of beer per day, stopped beer in Nov 2013, then  started wine, last ETOH at all was in Jan 2014    OB History   Grav Para Term Preterm Abortions TAB SAB Ect Mult Living                   Review of Systems  Constitutional: Negative for fever, chills, diaphoresis, appetite change and fatigue.  HENT: Negative for mouth sores, sore throat and trouble swallowing.   Eyes: Negative for visual disturbance.  Respiratory: Negative for cough, chest tightness, shortness of breath and wheezing.   Cardiovascular: Negative for chest pain.  Gastrointestinal: Positive for nausea and abdominal pain. Negative for vomiting, diarrhea and abdominal distention.  Endocrine: Negative for polydipsia, polyphagia and polyuria.  Genitourinary: Negative for dysuria, frequency and hematuria.  Musculoskeletal: Negative for gait problem.  Skin: Positive for wound (abscess). Negative for color change, pallor and rash.  Neurological: Negative for dizziness, syncope, light-headedness and headaches.  Hematological: Does not bruise/bleed easily.  Psychiatric/Behavioral: Negative for behavioral problems and confusion.      Allergies  Doxycycline and Sulfa antibiotics  Home Medications   Prior to Admission medications   Medication Sig Start Date End Date Taking? Authorizing Provider  ALPRAZolam Prudy Feeler(XANAX) 0.5 MG tablet Take 0.5 mg  by mouth every 6 (six) hours as needed for anxiety.   Yes Historical Provider, MD  ondansetron (ZOFRAN) 4 MG tablet Take 1 tablet (4 mg total) by mouth 4 (four) times daily -  before meals and at bedtime. 11/20/13  Yes [place], MD  Pseudoephedrine-Ibuprofen (ADVIL COLD/SINUS PO) Take 2 capsules by mouth once.   Yes Historical Provider, MD  cephALEXin (KEFLEX) 500 MG capsule Take 1 capsule (500 mg total) by mouth 3 (three) times daily. 12/19/13   Rolland Porter, MD  clindamycin (CLEOCIN) 150 MG capsule Take 1 capsule (150 mg total) by mouth every 6 (six) hours. 12/19/13   Rolland Porter, MD  potassium chloride (K-DUR) 10 MEQ tablet Take 1 tablet  (10 mEq total) by mouth 2 (two) times daily. 12/19/13   Rolland Porter, MD   BP 118/73  Pulse 96  Temp(Src) 100.4 F (38 C) (Oral)  Resp 20  Ht 5\' 1"  (1.549 m)  Wt 126 lb (57.153 kg)  BMI 23.82 kg/m2  SpO2 95%  Physical Exam  Nursing note and vitals reviewed. Constitutional: She is oriented to person, place, and time. She appears well-developed and well-nourished. No distress.  HENT:  Head: Normocephalic.  Eyes: Conjunctivae are normal. Pupils are equal, round, and reactive to light. No scleral icterus.  Neck: Normal range of motion. Neck supple. No thyromegaly present.  Cardiovascular: Normal rate and regular rhythm.  Exam reveals no gallop and no friction rub.   No murmur heard. Pulmonary/Chest: Effort normal and breath sounds normal. No respiratory distress. She has no wheezes. She has no rales.  Abdominal: Soft. Bowel sounds are normal. She exhibits no distension. There is tenderness (generalized). There is no rebound.  Musculoskeletal: Normal range of motion.  Neurological: She is alert and oriented to person, place, and time.  Skin: Skin is warm and dry. No rash noted.  Left axilla: area of erythema and tenderness, 5 cm in diameter, without fluctuance.  Psychiatric: She has a normal mood and affect. Her behavior is normal.    ED Course  Procedures (including critical care time)  DIAGNOSTIC STUDIES: Oxygen Saturation is 95% on room air, adequate by my interpretation.    COORDINATION OF CARE: 11:21 PM-Discussed treatment plan which includes bedside US with pt at bedside and pt agreed to plan.   11:26 PM-Bedside US shows no fluid collection suggestive of abscess. Discussed treatment plan which includes pain medication, antibiotics and labs with pt at bedside and pt agreed to plan.     Labs Review Labs Reviewed  CBC WITH DIFFERENTIAL - Abnormal; Notable for the following:    RBC 3.80 (*)    HCT 35.9 (*)    Neutrophils Relative % 82 (*)    Neutro Abs 8.0 (*)    Lymphocytes  Relative 9 (*)    All other components within normal limits  COMPREHENSIVE METABOLIC PANEL - Abnormal; Notable for the following:    Potassium 2.9 (*)    Glucose, Bld 102 (*)    BUN 3 (*)    Creatinine, Ser 0.43 (*)    All other components within normal limits  LIPASE, BLOOD    Imaging Review No results found.   EKG Interpretation None      MDM   Final diagnoses:  Cellulitis of chest wall  Generalized abdominal pain  Hypokalemia   Pt with chronic Pancreatitis and chronic AP.  No acute lab abnormalities, except HypoK+.  No abscess clinically, or via [place].  Plan is antibiotics, pain meds, K-dur.  F/U  with GI re: pancreatitis    I personally performed the services described in this documentation, which was scribed in my presence. The recorded information has been reviewed and is accurate.   Rolland PorterMark Rollen Selders, MD 12/19/13 [phone]

## 2013-12-18 NOTE — ED Notes (Signed)
Pt noticed knots under her left arm, getting worse, red, swollen, painful, pt states pancreatis pain started up today, nausea no vomiting

## 2013-12-19 LAB — COMPREHENSIVE METABOLIC PANEL
ALT: 17 U/L (ref 0–35)
AST: 22 U/L (ref 0–37)
Albumin: 3.6 g/dL (ref 3.5–5.2)
Alkaline Phosphatase: 111 U/L (ref 39–117)
Anion gap: 13 (ref 5–15)
BILIRUBIN TOTAL: 0.3 mg/dL (ref 0.3–1.2)
BUN: 3 mg/dL — AB (ref 6–23)
CHLORIDE: 102 meq/L (ref 96–112)
CO2: 27 mEq/L (ref 19–32)
CREATININE: 0.43 mg/dL — AB (ref 0.50–1.10)
Calcium: 8.5 mg/dL (ref 8.4–10.5)
GFR calc Af Amer: >90 mL/min (ref >90)
GFR calc non Af Amer: >90 mL/min (ref >90)
Glucose, Bld: 102 mg/dL — ABNORMAL HIGH (ref 70–99)
Potassium: 2.9 mEq/L — CL (ref 3.7–5.3)
Sodium: 142 mEq/L (ref 137–147)
Total Protein: 6.7 g/dL (ref 6.0–8.3)

## 2013-12-19 LAB — LIPASE, BLOOD: LIPASE: 14 U/L (ref 11–59)

## 2013-12-19 MED ORDER — POTASSIUM CHLORIDE ER 10 MEQ PO TBCR: 10.0000 meq | EXTENDED_RELEASE_TABLET | Freq: Two times a day (BID) | ORAL | Status: DC

## 2013-12-19 MED ORDER — CLINDAMYCIN HCL 150 MG PO CAPS: 150.0000 mg | ORAL_CAPSULE | Freq: Four times a day (QID) | ORAL | Status: DC

## 2013-12-19 MED ORDER — CEPHALEXIN 500 MG PO CAPS: 500.0000 mg | ORAL_CAPSULE | Freq: Three times a day (TID) | ORAL | Status: DC

## 2013-12-19 MED ORDER — HYDROCODONE-ACETAMINOPHEN 5-325 MG PO TABS: 1.0000 | ORAL_TABLET | ORAL | Status: DC | PRN

## 2013-12-19 NOTE — ED Notes (Signed)
CRITICAL VALUE ALERT  Critical value received:  Potassium 2.9  Date of notification:  12/19/13  Time of notification:  0025  Critical value read back:Yes.    Nurse who received alert:  Kathlene CoteJamie Flynt Breeze, RN  MD notified (1st page):  Dr. Fayrene FearingJames  Time of first page:  [phone]

## 2013-12-19 NOTE — Discharge Instructions (Signed)
Cellulitis Cellulitis is an infection of the skin and the tissue under the skin. The infected area is usually red and tender. This happens most often in the arms and lower legs. HOME CARE   Take your antibiotic medicine as told. Finish the medicine even if you start to feel better.  Keep the infected arm or leg raised (elevated).  Put a warm cloth on the area up to 4 times per day.  Only take medicines as told by your doctor.  Keep all doctor visits as told. GET HELP RIGHT AWAY IF:   You have a fever.  You feel very sleepy.  You throw up (vomit) or have watery poop (diarrhea).  You feel sick and have muscle aches and pains.  You see red streaks on the skin coming from the infected area.  Your red area gets bigger or turns a dark color.  Your bone or joint under the infected area is painful after the skin heals.  Your infection comes back in the same area or different area.  You have a puffy (swollen) bump in the infected area.  You have new symptoms. MAKE SURE YOU:   Understand these instructions.  Will watch your condition.  Will get help right away if you are not doing well or get worse. Document Released: 11/16/2007 Document Revised: 11/29/2011 Document Reviewed: 08/15/2011 [facility] Patient Information 2015 [place], [place]. This information is not intended to replace advice given to you by your health care provider. Make sure you discuss any questions you have with your health care provider.  Abdominal Pain, Women Abdominal (stomach, pelvic, or belly) pain can be caused by many things. It is important to tell your doctor:  The location of the pain.  Does it come and go or is it present all the time?  Are there things that start the pain (eating certain foods, exercise)?  Are there other symptoms associated with the pain (fever, nausea, vomiting, diarrhea)? All of this is helpful to know when trying to find the cause of the pain. CAUSES   Stomach: virus or  bacteria infection, or ulcer.  Intestine: appendicitis (inflamed appendix), regional ileitis (Crohn's disease), ulcerative colitis (inflamed colon), irritable bowel syndrome, diverticulitis (inflamed diverticulum of the colon), or cancer of the stomach or intestine.  Gallbladder disease or stones in the gallbladder.  Kidney disease, kidney stones, or infection.  Pancreas infection or cancer.  Fibromyalgia (pain disorder).  Diseases of the female organs:  Uterus: fibroid (non-cancerous) tumors or infection.  Fallopian tubes: infection or tubal pregnancy.  Ovary: cysts or tumors.  Pelvic adhesions (scar tissue).  Endometriosis (uterus lining tissue growing in the pelvis and on the pelvic organs).  Pelvic congestion syndrome (female organs filling up with blood just before the menstrual period).  Pain with the menstrual period.  Pain with ovulation (producing an egg).  Pain with an IUD (intrauterine device, birth control) in the uterus.  Cancer of the female organs.  Functional pain (pain not caused by a disease, may improve without treatment).  Psychological pain.  Depression. DIAGNOSIS  Your doctor will decide the seriousness of your pain by doing an examination.  Blood tests.  X-rays.  Ultrasound.  CT scan (computed tomography, special type of X-ray).  MRI (magnetic resonance imaging).  Cultures, for infection.  Barium enema (dye inserted in the large intestine, to better view it with X-rays).  Colonoscopy (looking in intestine with a lighted tube).  Laparoscopy (minor surgery, looking in abdomen with a lighted tube).  Major abdominal exploratory  surgery (looking in abdomen with a large incision). TREATMENT  The treatment will depend on the cause of the pain.   Many cases can be observed and treated at home.  Over-the-counter medicines recommended by your caregiver.  Prescription medicine.  Antibiotics, for infection.  Birth control pills, for  painful periods or for ovulation pain.  Hormone treatment, for endometriosis.  Nerve blocking injections.  Physical therapy.  Antidepressants.  Counseling with a psychologist or psychiatrist.  Minor or major surgery. HOME CARE INSTRUCTIONS   Do not take laxatives, unless directed by your caregiver.  Take over-the-counter pain medicine only if ordered by your caregiver. Do not take aspirin because it can cause an upset stomach or bleeding.  Try a clear liquid diet (broth or water) as ordered by your caregiver. Slowly move to a bland diet, as tolerated, if the pain is related to the stomach or intestine.  Have a thermometer and take your temperature several times a day, and record it.  Bed rest and sleep, if it helps the pain.  Avoid sexual intercourse, if it causes pain.  Avoid stressful situations.  Keep your follow-up appointments and tests, as your caregiver orders.  If the pain does not go away with medicine or surgery, you may try:  Acupuncture.  Relaxation exercises (yoga, meditation).  Group therapy.  Counseling. SEEK MEDICAL CARE IF:   You notice certain foods cause stomach pain.  Your home care treatment is not helping your pain.  You need stronger pain medicine.  You want your IUD removed.  You feel faint or lightheaded.  You develop nausea and vomiting.  You develop a rash.  You are having side effects or an allergy to your medicine. SEEK IMMEDIATE MEDICAL CARE IF:   Your pain does not go away or gets worse.  You have a fever.  Your pain is felt only in portions of the abdomen. The right side could possibly be appendicitis. The left lower portion of the abdomen could be colitis or diverticulitis.  You are passing blood in your stools (bright red or black tarry stools, with or without vomiting).  You have blood in your urine.  You develop chills, with or without a fever.  You pass out. MAKE SURE YOU:   Understand these  instructions.  Will watch your condition.  Will get help right away if you are not doing well or get worse. Document Released: 03/27/2007 Document Revised: 08/22/2011 Document Reviewed: 04/16/2009 [facility] Patient Information 2015 [place], [place]. This information is not intended to replace advice given to you by your health care provider. Make sure you discuss any questions you have with your health care provider.  Hypokalemia Hypokalemia means that the amount of potassium in the blood is lower than normal.Potassium is a chemical, called an electrolyte, that helps regulate the amount of fluid in the body. It also stimulates muscle contraction and helps nerves function properly.Most of the body's potassium is inside of cells, and only a very small amount is in the blood. Because the amount in the blood is so small, minor changes can be life-threatening. CAUSES  Antibiotics.  Diarrhea or vomiting.  Using laxatives too much, which can cause diarrhea.  Chronic kidney disease.  Water pills (diuretics).  Eating disorders (bulimia).  Low magnesium level.  Sweating a lot. SIGNS AND SYMPTOMS  Weakness.  Constipation.  Fatigue.  Muscle cramps.  Mental confusion.  Skipped heartbeats or irregular heartbeat (palpitations).  Tingling or numbness. DIAGNOSIS  Your health care provider can diagnose hypokalemia with  blood tests. In addition to checking your potassium level, your health care provider may also check other lab tests. TREATMENT Hypokalemia can be treated with potassium supplements taken by mouth or adjustments in your current medicines. If your potassium level is very low, you may need to get potassium through a vein (IV) and be monitored in the hospital. A diet high in potassium is also helpful. Foods high in potassium are:  Nuts, such as peanuts and pistachios.  Seeds, such as sunflower seeds and pumpkin seeds.  Peas, lentils, and lima beans.  Whole grain and bran  cereals and breads.  Fresh fruit and vegetables, such as apricots, avocado, bananas, cantaloupe, kiwi, oranges, tomatoes, asparagus, and potatoes.  Orange and tomato juices.  Red meats.  Fruit yogurt. HOME CARE INSTRUCTIONS  Take all medicines as prescribed by your health care provider.  Maintain a healthy diet by including nutritious food, such as fruits, vegetables, nuts, whole grains, and lean meats.  If you are taking a laxative, be sure to follow the directions on the label. SEEK MEDICAL CARE IF:  Your weakness gets worse.  You feel your heart pounding or racing.  You are vomiting or having diarrhea.  You are diabetic and having trouble keeping your blood glucose in the normal range. SEEK IMMEDIATE MEDICAL CARE IF:  You have chest pain, shortness of breath, or dizziness.  You are vomiting or having diarrhea for more than 2 days.  You faint. MAKE SURE YOU:   Understand these instructions.  Will watch your condition.  Will get help right away if you are not doing well or get worse. Document Released: 05/30/2005 Document Revised: 03/20/2013 Document Reviewed: 11/30/2012 [facility] Patient Information 2015 [place], [place]. This information is not intended to replace advice given to you by your health care provider. Make sure you discuss any questions you have with your health care provider.

## 2014-02-20 ENCOUNTER — Encounter: Payer: Self-pay | Admitting: Gastroenterology

## 2014-05-02 ENCOUNTER — Telehealth: Payer: Self-pay

## 2014-05-02 NOTE — Telephone Encounter (Signed)
Pt called today to make appointment with [facility]MR. She is a [facility] patient but does not want to see her any more. Please advise if she can change her doctors.

## 2014-05-02 NOTE — Telephone Encounter (Signed)
I will await response from [facility] & [facility]

## 2014-05-07 NOTE — Telephone Encounter (Signed)
REVIEWED. AGREE. NO ADDITIONAL RECOMMENDATIONS. PT SHOULD FIND ALTERNATIVE GI PRACTICE.

## 2014-05-07 NOTE — Telephone Encounter (Signed)
I tried to call the patient, no answer,lmom 

## 2014-05-07 NOTE — Telephone Encounter (Signed)
Per RMR he will not be accepting Monique Nguyen as a patient, she will need to get her PCP to refer her to another GI doctor.

## 2014-10-09 ENCOUNTER — Emergency Department (HOSPITAL_COMMUNITY): Payer: Self-pay

## 2014-10-09 ENCOUNTER — Emergency Department (HOSPITAL_COMMUNITY)
Admission: EM | Admit: 2014-10-09 | Discharge: 2014-10-09 | Disposition: A | Discharge status: Home / Self Care | Payer: Self-pay | Attending: Emergency Medicine | Admitting: Emergency Medicine

## 2014-10-09 ENCOUNTER — Encounter (HOSPITAL_COMMUNITY): Payer: Self-pay

## 2014-10-09 DIAGNOSIS — Z8719 Personal history of other diseases of the digestive system: Secondary | ICD-10-CM | POA: Insufficient documentation

## 2014-10-09 DIAGNOSIS — Z8742 Personal history of other diseases of the female genital tract: Secondary | ICD-10-CM | POA: Insufficient documentation

## 2014-10-09 DIAGNOSIS — G8929 Other chronic pain: Secondary | ICD-10-CM | POA: Insufficient documentation

## 2014-10-09 DIAGNOSIS — Z3202 Encounter for pregnancy test, result negative: Secondary | ICD-10-CM | POA: Insufficient documentation

## 2014-10-09 DIAGNOSIS — Z9889 Other specified postprocedural states: Secondary | ICD-10-CM | POA: Insufficient documentation

## 2014-10-09 DIAGNOSIS — Z720 Tobacco use: Secondary | ICD-10-CM | POA: Insufficient documentation

## 2014-10-09 DIAGNOSIS — M549 Dorsalgia, unspecified: Secondary | ICD-10-CM | POA: Insufficient documentation

## 2014-10-09 DIAGNOSIS — Z8619 Personal history of other infectious and parasitic diseases: Secondary | ICD-10-CM | POA: Insufficient documentation

## 2014-10-09 DIAGNOSIS — Z872 Personal history of diseases of the skin and subcutaneous tissue: Secondary | ICD-10-CM | POA: Insufficient documentation

## 2014-10-09 DIAGNOSIS — Z792 Long term (current) use of antibiotics: Secondary | ICD-10-CM | POA: Insufficient documentation

## 2014-10-09 DIAGNOSIS — Z79899 Other long term (current) drug therapy: Secondary | ICD-10-CM | POA: Insufficient documentation

## 2014-10-09 DIAGNOSIS — R109 Unspecified abdominal pain: Secondary | ICD-10-CM | POA: Insufficient documentation

## 2014-10-09 DIAGNOSIS — R112 Nausea with vomiting, unspecified: Secondary | ICD-10-CM | POA: Insufficient documentation

## 2014-10-09 DIAGNOSIS — F410 Panic disorder [episodic paroxysmal anxiety] without agoraphobia: Secondary | ICD-10-CM | POA: Insufficient documentation

## 2014-10-09 DIAGNOSIS — F329 Major depressive disorder, single episode, unspecified: Secondary | ICD-10-CM | POA: Insufficient documentation

## 2014-10-09 LAB — LIPASE, BLOOD: Lipase: 20 U/L (ref 11–59)

## 2014-10-09 LAB — COMPREHENSIVE METABOLIC PANEL
ALT: 43 U/L — ABNORMAL HIGH (ref 0–35)
ANION GAP: 9 (ref 5–15)
AST: 48 U/L — AB (ref 0–37)
Albumin: 4.3 g/dL (ref 3.5–5.2)
Alkaline Phosphatase: 110 U/L (ref 39–117)
BUN: 5 mg/dL — AB (ref 6–23)
CO2: 30 mmol/L (ref 19–32)
CREATININE: 0.61 mg/dL (ref 0.50–1.10)
Calcium: 9 mg/dL (ref 8.4–10.5)
Chloride: 102 mmol/L (ref 96–112)
GFR calc Af Amer: >90 mL/min (ref >90)
GFR calc non Af Amer: >90 mL/min (ref >90)
Glucose, Bld: 112 mg/dL — ABNORMAL HIGH (ref 70–99)
Potassium: 3.3 mmol/L — ABNORMAL LOW (ref 3.5–5.1)
Sodium: 141 mmol/L (ref 135–145)
TOTAL PROTEIN: 7.3 g/dL (ref 6.0–8.3)
Total Bilirubin: 0.6 mg/dL (ref 0.3–1.2)

## 2014-10-09 LAB — CBC WITH DIFFERENTIAL/PLATELET
Basophils Absolute: 0 10*3/uL (ref 0.0–0.1)
Basophils Relative: 0 % (ref 0–1)
EOS ABS: 0.5 10*3/uL (ref 0.0–0.7)
Eosinophils Relative: 8 % — ABNORMAL HIGH (ref 0–5)
HCT: 40 % (ref 36.0–46.0)
Hemoglobin: 13.5 g/dL (ref 12.0–15.0)
Lymphocytes Relative: 19 % (ref 12–46)
Lymphs Abs: 1.3 10*3/uL (ref 0.7–4.0)
MCH: 32.5 pg (ref 26.0–34.0)
MCHC: 33.8 g/dL (ref 30.0–36.0)
MCV: 96.4 fL (ref 78.0–100.0)
MONO ABS: 0.6 10*3/uL (ref 0.1–1.0)
MONOS PCT: 8 % (ref 3–12)
Neutro Abs: 4.4 10*3/uL (ref 1.7–7.7)
Neutrophils Relative %: 65 % (ref 43–77)
Platelets: 216 10*3/uL (ref 150–400)
RBC: 4.15 MIL/uL (ref 3.87–5.11)
RDW: 13.5 % (ref 11.5–15.5)
WBC: 6.9 10*3/uL (ref 4.0–10.5)

## 2014-10-09 LAB — URINE MICROSCOPIC-ADD ON

## 2014-10-09 LAB — URINALYSIS, ROUTINE W REFLEX MICROSCOPIC
Bilirubin Urine: NEGATIVE
Glucose, UA: NEGATIVE mg/dL
Ketones, ur: NEGATIVE mg/dL
Nitrite: NEGATIVE
PH: 5.5 (ref 5.0–8.0)
Protein, ur: NEGATIVE mg/dL
Specific Gravity, Urine: <1.005 — ABNORMAL LOW (ref 1.005–1.030)
Urobilinogen, UA: 0.2 mg/dL (ref 0.0–1.0)

## 2014-10-09 LAB — PREGNANCY, URINE: Preg Test, Ur: NEGATIVE

## 2014-10-09 MED ORDER — HYDROCODONE-ACETAMINOPHEN 5-325 MG PO TABS: 1.0000 | ORAL_TABLET | Freq: Four times a day (QID) | ORAL | Status: DC | PRN

## 2014-10-09 MED ORDER — SODIUM CHLORIDE 0.9 % IV SOLN: INTRAVENOUS | Status: DC

## 2014-10-09 MED ORDER — ONDANSETRON HCL 4 MG/2ML IJ SOLN: 4.0000 mg | Freq: Once | INTRAMUSCULAR | Status: DC

## 2014-10-09 MED ORDER — IOHEXOL 300 MG/ML  SOLN
100.0000 mL | Freq: Once | INTRAMUSCULAR | Status: AC | PRN
  Administered 2014-10-09: 100 mL via INTRAVENOUS

## 2014-10-09 MED ORDER — SODIUM CHLORIDE 0.9 % IV BOLUS (SEPSIS)
1000.0000 mL | Freq: Once | INTRAVENOUS | Status: AC
  Administered 2014-10-09: 1000 mL via INTRAVENOUS

## 2014-10-09 MED ORDER — PROMETHAZINE HCL 25 MG PO TABS: 25.0000 mg | ORAL_TABLET | Freq: Four times a day (QID) | ORAL | Status: DC | PRN

## 2014-10-09 MED ORDER — FENTANYL CITRATE (PF) 100 MCG/2ML IJ SOLN: 50.0000 ug | Freq: Once | INTRAMUSCULAR | Status: DC

## 2014-10-09 MED ORDER — SODIUM CHLORIDE 0.9 % IV BOLUS (SEPSIS): 1000.0000 mL | Freq: Once | INTRAVENOUS | Status: DC

## 2014-10-09 MED ORDER — MORPHINE SULFATE 4 MG/ML IJ SOLN
4.0000 mg | Freq: Once | INTRAMUSCULAR | Status: AC
  Administered 2014-10-09: 4 mg via INTRAVENOUS
  Filled 2014-10-09: qty 1

## 2014-10-09 MED ORDER — ONDANSETRON HCL 4 MG/2ML IJ SOLN
4.0000 mg | Freq: Once | INTRAMUSCULAR | Status: AC
  Administered 2014-10-09: 4 mg via INTRAVENOUS
  Filled 2014-10-09: qty 2

## 2014-10-09 NOTE — Discharge Instructions (Signed)
Take medications as directed. Follow-up with your regular doctor next few days. Return for any new or worse symptoms. Today's workup without any significant complicating factors.

## 2014-10-09 NOTE — ED Provider Notes (Signed)
CSN: [id number]     Arrival date & time 10/09/14  1043 History   First MD Initiated Contact with Patient 10/09/14 1308     Chief Complaint  Patient presents with  . Abdominal Pain     (Consider location/radiation/quality/duration/timing/severity/associated sxs/prior Treatment) Patient is a 44 y.o. female presenting with abdominal pain. The history is provided by the patient.  Abdominal Pain Associated symptoms: nausea and vomiting   Associated symptoms: no chest pain, no diarrhea, no dysuria, no fever and no shortness of breath    patient with history of chronic abdominal pain. However with exacerbation of left-sided abdominal pain for the past 2 days. Associated with nausea and vomiting and dry heaves. Patient concerned she's got an exacerbation of her pregnant keratitis. Pain is currently 8 out of 10. Stabbing and sharp and burning in nature. No fevers no diarrhea  Past Medical History  Diagnosis Date  . GERD (gastroesophageal reflux disease)   . Bacterial vaginosis   . Anxiety   . Depression   . Panic attacks   . Psoriasis   . IBS (irritable bowel syndrome)   . Pancreatitis     ETOH   Past Surgical History  Procedure Laterality Date  . Bladder surgery      X 3  . Eus N/A 10/11/2012    Procedure: UPPER ENDOSCOPIC ULTRASOUND (EUS) LINEAR;  Surgeon: Rachael Fee, MD;  Location: [facility] END[facility]COPY;  Service: Endoscopy;  Laterality: N/A;  . Esophagogastroduodenoscopy endoscopy     Family History  Problem Relation Age of Onset  . Depression Mother     living  . Pancreatic cancer Father     living  . Colon cancer Neg Hx   . Colon polyps Neg Hx    History  Substance Use Topics  . Smoking status: Current Every Day Smoker -- 1.00 packs/day for 8 years    Types: Cigarettes  . Smokeless tobacco: Never Used     Comment: Smokes one pack of cigarettes daily  . Alcohol Use: No     Comment: former   OB History    No data available     Review of Systems  Constitutional: Negative  for fever.  HENT: Negative for congestion.   Respiratory: Negative for shortness of breath.   Cardiovascular: Negative for chest pain.  Gastrointestinal: Positive for nausea, vomiting and abdominal pain. Negative for diarrhea.  Genitourinary: Negative for dysuria.  Musculoskeletal: Positive for back pain.  Skin: Negative for rash.  Neurological: Negative for headaches.  Hematological: Does not bruise/bleed easily.  Psychiatric/Behavioral: Negative for confusion.      Allergies  Doxycycline and Sulfa antibiotics  Home Medications   Prior to Admission medications   Medication Sig Start Date End Date Taking? Authorizing Provider  ALPRAZolam Prudy Feeler) 1 MG tablet Take 1 tablet by mouth 2 (two) times daily. 09/15/14  Yes Historical Provider, MD  clobetasol (TEMOVATE) 0.05 % external solution Apply 1 application topically 2 (two) times daily.  09/15/14  Yes Historical Provider, MD  DULoxetine (CYMBALTA) 60 MG capsule Take 60 mg by mouth daily. 10/01/14  Yes Historical Provider, MD  estradiol (ESTRACE) 0.5 MG tablet Take 0.5 mg by mouth daily. 09/25/14  Yes Historical Provider, MD  HYDROcodone-acetaminophen (NORCO) 10-325 MG per tablet Take 1 tablet by mouth every 6 (six) hours as needed for moderate pain.  09/15/14  Yes Historical Provider, MD  promethazine (PHENERGAN) 25 MG tablet Take 1 tablet by mouth every 6 (six) hours as needed for nausea or vomiting.  10/04/14  Yes Historical Provider, MD  venlafaxine XR (EFFEXOR-XR) 150 MG 24 hr capsule Take 1 capsule by mouth daily. 09/11/14  Yes Historical Provider, MD  vitamin B-12 (CYANOCOBALAMIN) 1000 MCG tablet Take 1,000 mcg by mouth daily.   Yes Historical Provider, MD  cephALEXin (KEFLEX) 500 MG capsule Take 1 capsule (500 mg total) by mouth 3 (three) times daily. Patient not taking: Reported on 10/09/2014 12/19/13   Rolland PorterMark James, MD  clindamycin (CLEOCIN) 150 MG capsule Take 1 capsule (150 mg total) by mouth every 6 (six) hours. Patient not taking:  Reported on 10/09/2014 12/19/13   Rolland PorterMark James, MD  HYDROcodone-acetaminophen (NORCO/VICODIN) 5-325 MG per tablet Take 1 tablet by mouth every 4 (four) hours as needed. Patient not taking: Reported on 10/09/2014 12/19/13   Rolland PorterMark James, MD  HYDROcodone-acetaminophen (NORCO/VICODIN) 5-325 MG per tablet Take 1-2 tablets by mouth every 6 (six) hours as needed. 10/09/14   Vanetta MuldersScott Josef Tourigny, MD  ondansetron (ZOFRAN) 4 MG tablet Take 1 tablet (4 mg total) by mouth 4 (four) times daily -  before meals and at bedtime. Patient not taking: Reported on 10/09/2014 11/20/13   West BaliSandi L Fields, MD  potassium chloride (K-DUR) 10 MEQ tablet Take 1 tablet (10 mEq total) by mouth 2 (two) times daily. Patient not taking: Reported on 10/09/2014 12/19/13   Rolland PorterMark James, MD  promethazine (PHENERGAN) 25 MG tablet Take 1 tablet (25 mg total) by mouth every 6 (six) hours as needed. 10/09/14   Vanetta MuldersScott Brynley Cuddeback, MD   BP 128/87 mmHg  Pulse 111  Temp(Src) 99 F (37.2 C) (Oral)  Resp 18  Ht 5\' 1"  (1.549 m)  Wt 134 lb (60.782 kg)  BMI 25.33 kg/m2  SpO2 100%  LMP 03/13/2013 Physical Exam  Constitutional: She is oriented to person, place, and time. She appears well-developed and well-nourished. No distress.  HENT:  Head: Normocephalic and atraumatic.  Mouth/Throat: Oropharynx is clear and moist.  Eyes: Conjunctivae and EOM are normal. Pupils are equal, round, and reactive to light.  Neck: Normal range of motion. Neck supple.  Cardiovascular: Normal rate, regular rhythm and normal heart sounds.   Pulmonary/Chest: Effort normal and breath sounds normal.  Abdominal: Soft. Bowel sounds are normal. There is tenderness.  Musculoskeletal: Normal range of motion.  Neurological: She is alert and oriented to person, place, and time. No cranial nerve deficit. She exhibits normal muscle tone. Coordination normal.  Skin: Skin is warm. No rash noted.  Nursing note and vitals reviewed.   ED Course  Procedures (including critical care time) Labs  Review Labs Reviewed  CBC WITH DIFFERENTIAL/PLATELET - Abnormal; Notable for the following:    Eosinophils Relative 8 (*)    All other components within normal limits  COMPREHENSIVE METABOLIC PANEL - Abnormal; Notable for the following:    Potassium 3.3 (*)    Glucose, Bld 112 (*)    BUN 5 (*)    AST 48 (*)    ALT 43 (*)    All other components within normal limits  URINALYSIS, ROUTINE W REFLEX MICROSCOPIC - Abnormal; Notable for the following:    Specific Gravity, Urine <1.005 (*)    Hgb urine dipstick TRACE (*)    Leukocytes, UA TRACE (*)    All other components within normal limits  URINE MICROSCOPIC-ADD ON - Abnormal; Notable for the following:    Squamous Epithelial / LPF FEW (*)    All other components within normal limits  PREGNANCY, URINE  LIPASE, BLOOD   Results for orders placed or performed during  the hospital encounter of 10/09/14  CBC with Differential  Result Value Ref Range   WBC 6.9 4.0 - 10.5 K/uL   RBC 4.15 3.87 - 5.11 MIL/uL   Hemoglobin 13.5 12.0 - 15.0 g/dL   HCT 16.1 09.6 - 04.5 %   MCV 96.4 78.0 - 100.0 fL   MCH 32.5 26.0 - 34.0 pg   MCHC 33.8 30.0 - 36.0 g/dL   RDW 40.9 81.1 - 91.4 %   Platelets 216 150 - 400 K/uL   Neutrophils Relative % 65 43 - 77 %   Neutro Abs 4.4 1.7 - 7.7 K/uL   Lymphocytes Relative 19 12 - 46 %   Lymphs Abs 1.3 0.7 - 4.0 K/uL   Monocytes Relative 8 3 - 12 %   Monocytes Absolute 0.6 0.1 - 1.0 K/uL   Eosinophils Relative 8 (H) 0 - 5 %   Eosinophils Absolute 0.5 0.0 - 0.7 K/uL   Basophils Relative 0 0 - 1 %   Basophils Absolute 0.0 0.0 - 0.1 K/uL  Comprehensive metabolic panel  Result Value Ref Range   Sodium 141 135 - 145 mmol/L   Potassium 3.3 (L) 3.5 - 5.1 mmol/L   Chloride 102 96 - 112 mmol/L   CO2 30 19 - 32 mmol/L   Glucose, Bld 112 (H) 70 - 99 mg/dL   BUN 5 (L) 6 - 23 mg/dL   Creatinine, Ser 7.82 0.50 - 1.10 mg/dL   Calcium 9.0 8.4 - 95.6 mg/dL   Total Protein 7.3 6.0 - 8.3 g/dL   Albumin 4.3 3.5 - 5.2 g/dL    AST 48 (H) 0 - 37 U/L   ALT 43 (H) 0 - 35 U/L   Alkaline Phosphatase 110 39 - 117 U/L   Total Bilirubin 0.6 0.3 - 1.2 mg/dL   GFR calc non Af Amer >90 >90 mL/min   GFR calc Af Amer >90 >90 mL/min   Anion gap 9 5 - 15  Urinalysis, Routine w reflex microscopic  Result Value Ref Range   Color, Urine YELLOW YELLOW   APPearance CLEAR CLEAR   Specific Gravity, Urine <1.005 (L) 1.005 - 1.030   pH 5.5 5.0 - 8.0   Glucose, UA NEGATIVE NEGATIVE mg/dL   Hgb urine dipstick TRACE (A) NEGATIVE   Bilirubin Urine NEGATIVE NEGATIVE   Ketones, ur NEGATIVE NEGATIVE mg/dL   Protein, ur NEGATIVE NEGATIVE mg/dL   Urobilinogen, UA 0.2 0.0 - 1.0 mg/dL   Nitrite NEGATIVE NEGATIVE   Leukocytes, UA TRACE (A) NEGATIVE  Pregnancy, urine  Result Value Ref Range   Preg Test, Ur NEGATIVE NEGATIVE  Lipase, blood  Result Value Ref Range   Lipase 20 11 - 59 U/L  Urine microscopic-add on  Result Value Ref Range   Squamous Epithelial / LPF FEW (A) RARE   WBC, UA 3-6 <3 WBC/hpf     Imaging Review Dg Chest 2 View  10/09/2014   CLINICAL DATA:  Abdominal pain  EXAM: CHEST  2 VIEW  COMPARISON:  04/05/2013  FINDINGS: The heart size and mediastinal contours are within normal limits. Both lungs are clear. The visualized skeletal structures are unremarkable.  IMPRESSION: No active cardiopulmonary disease.   Electronically Signed   By: Marlan Palau M.D.   On: 10/09/2014 14:45   Ct Abdomen Pelvis W Contrast  10/09/2014   CLINICAL DATA:  Prior history pancreatitis, presenting with 2 day history of increasing upper abdominal pain, nausea, and dry heaves.  EXAM: CT ABDOMEN AND PELVIS WITH CONTRAST  TECHNIQUE: Multidetector CT imaging of the abdomen and pelvis was performed using the standard protocol following bolus administration of intravenous contrast.  CONTRAST:  OMNIPAQUE IOHEXOL 300 MG/ML IV. Oral contrast was also administered.  COMPARISON:  MRI abdomen 03/28/2013. CT abdomen pancreatic protocol 02/19/2013,  10/18/2012.  FINDINGS: Liver: Severe diffuse hepatic steatosis with focal sparing around the gallbladder fossa. No focal hepatic parenchymal abnormality.  Biliary: Normal-appearing gallbladder without calcified gallstones. No biliary ductal dilation.  Spleen:  Normal in size and appearance.  Pancreas: Atrophic body and tail with ductal dilation involving bowl body. No peripancreatic edema or inflammation. No pseudocyst.  Adrenal glands:  Normal in appearance.  Genitourinary: Simple cyst arising from the lateral upper pole of the left kidney, unchanged. No significant abnormalities involving either kidney. No visible urinary tract calculi. Urinary bladder unremarkable.  Retroflexed normal-appearing uterus. No adnexal masses or free pelvic fluid.  Gastrointestinal: Normal appearing stomach and small bowel. Descending colon, sigmoid colon and rectum decompressed. Sigmoid colon redundant. No focal abnormality involving the colon. Cecum positioned in the right mid abdomen, and normal-appearing retrocecal appendix identified in the right upper quadrant.  Vascular: Mild to moderate aortic atherosclerosis with noncalcified plaque. Widely patent visceral arteries. 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  Lymphatic:  No pathologic lymphadenopathy in the abdomen or pelvis.  Ascites: Absent.  Musculoskeletal: Regional skeleton intact.  Other findings: None.  Visualized lower thorax: Heart size normal. Lung bases clear apart from minimal scarring in the lingula and the lower lobes.  IMPRESSION: 1. No acute abnormalities involving the abdomen or pelvis. Specifically, no evidence of acute pancreatitis currently. 2. Marked atrophy of the pancreatic body and tail. 3. Severe diffuse hepatic steatosis without focal hepatic parenchymal abnormality. 4. Mild to moderate aortic atherosclerosis, advanced for age.   Electronically Signed   By: Hulan Saas M.D.   On: 10/09/2014 14:39     EKG  Interpretation None      MDM   Final diagnoses:  Abdominal pain  Abdominal pain    Patient was concerned that she had a flare of her pancreatitis. Based on today's labs and CAT scan no significant findings to explain the abdominal pain. May be chronic abdominal pain. Patient will be discharged back with follow-up with primary care doctor. Patient improved here with medications provided in the emergency department.    Vanetta Mulders, MD 10/09/14 [phone]

## 2014-10-09 NOTE — ED Notes (Signed)
Pt verbalized understanding of no driving and to use caution within 4 hours of taking pain meds due to meds cause drowsiness 

## 2014-10-09 NOTE — ED Notes (Signed)
MD at bedside. 

## 2014-10-09 NOTE — ED Notes (Signed)
Pt reports history of pancreatitis.  Reports worsening pain, nausea, and dry heaves x 2 days.

## 2014-10-10 ENCOUNTER — Encounter (HOSPITAL_COMMUNITY): Payer: Self-pay | Admitting: *Deleted

## 2014-10-10 ENCOUNTER — Emergency Department (HOSPITAL_COMMUNITY)
Admission: EM | Admit: 2014-10-10 | Discharge: 2014-10-10 | Disposition: A | Discharge status: Home / Self Care | Payer: Self-pay | Attending: Emergency Medicine | Admitting: Emergency Medicine

## 2014-10-10 DIAGNOSIS — Z872 Personal history of diseases of the skin and subcutaneous tissue: Secondary | ICD-10-CM | POA: Insufficient documentation

## 2014-10-10 DIAGNOSIS — Z720 Tobacco use: Secondary | ICD-10-CM | POA: Insufficient documentation

## 2014-10-10 DIAGNOSIS — R1084 Generalized abdominal pain: Secondary | ICD-10-CM | POA: Insufficient documentation

## 2014-10-10 DIAGNOSIS — Z8719 Personal history of other diseases of the digestive system: Secondary | ICD-10-CM | POA: Insufficient documentation

## 2014-10-10 DIAGNOSIS — Z8742 Personal history of other diseases of the female genital tract: Secondary | ICD-10-CM | POA: Insufficient documentation

## 2014-10-10 DIAGNOSIS — Z79899 Other long term (current) drug therapy: Secondary | ICD-10-CM | POA: Insufficient documentation

## 2014-10-10 DIAGNOSIS — G8929 Other chronic pain: Secondary | ICD-10-CM | POA: Insufficient documentation

## 2014-10-10 DIAGNOSIS — F329 Major depressive disorder, single episode, unspecified: Secondary | ICD-10-CM | POA: Insufficient documentation

## 2014-10-10 DIAGNOSIS — F410 Panic disorder [episodic paroxysmal anxiety] without agoraphobia: Secondary | ICD-10-CM | POA: Insufficient documentation

## 2014-10-10 NOTE — ED Provider Notes (Signed)
CSN: [id number]     Arrival date & time 10/10/14  1206 History   First MD Initiated Contact with Patient 10/10/14 1221     Chief Complaint  Patient presents with  . Abdominal Pain     (Consider location/radiation/quality/duration/timing/severity/associated sxs/prior Treatment) HPI Comments: Patient returns to the ER for evaluation of abdominal pain. Patient tells me that she was told to come back today if she did not get any better. She was seen in the ER yesterday for similar symptoms. Patient has a history of chronic abdominal pain.  Patient is a 44 y.o. female presenting with abdominal pain.  Abdominal Pain   Past Medical History  Diagnosis Date  . GERD (gastroesophageal reflux disease)   . Bacterial vaginosis   . Anxiety   . Depression   . Panic attacks   . Psoriasis   . IBS (irritable bowel syndrome)   . Pancreatitis     ETOH   Past Surgical History  Procedure Laterality Date  . Bladder surgery      X 3  . Eus N/A 10/11/2012    Procedure: UPPER ENDOSCOPIC ULTRASOUND (EUS) LINEAR;  Surgeon: Rachael Fee, MD;  Location: [facility];  Service: Endoscopy;  Laterality: N/A;  . Esophagogastroduodenoscopy endoscopy     Family History  Problem Relation Age of Onset  . Depression Mother     living  . Pancreatic cancer Father     living  . Colon cancer Neg Hx   . Colon polyps Neg Hx    History  Substance Use Topics  . Smoking status: Current Every Day Smoker -- 1.00 packs/day for 8 years    Types: Cigarettes  . Smokeless tobacco: Never Used     Comment: Smokes one pack of cigarettes daily  . Alcohol Use: No     Comment: former   OB History    No data available     Review of Systems  Gastrointestinal: Positive for abdominal pain.  All other systems reviewed and are negative.     Allergies  Doxycycline and Sulfa antibiotics  Home Medications   Prior to Admission medications   Medication Sig Start Date End Date Taking? Authorizing Provider  ALPRAZolam  Prudy Feeler) 1 MG tablet Take 1 tablet by mouth 2 (two) times daily. 09/15/14  Yes Historical Provider, MD  clobetasol (TEMOVATE) 0.05 % external solution Apply 1 application topically 2 (two) times daily.  09/15/14  Yes Historical Provider, MD  DULoxetine (CYMBALTA) 60 MG capsule Take 60 mg by mouth daily. 10/01/14  Yes Historical Provider, MD  estradiol (ESTRACE) 0.5 MG tablet Take 0.5 mg by mouth daily. 09/25/14  Yes Historical Provider, MD  HYDROcodone-acetaminophen (NORCO) 10-325 MG per tablet Take 1 tablet by mouth every 6 (six) hours as needed for moderate pain.  09/15/14  Yes Historical Provider, MD  HYDROcodone-acetaminophen (NORCO/VICODIN) 5-325 MG per tablet Take 1-2 tablets by mouth every 6 (six) hours as needed. 10/09/14  Yes Vanetta Mulders, MD  promethazine (PHENERGAN) 25 MG tablet Take 1 tablet (25 mg total) by mouth every 6 (six) hours as needed. 10/09/14  Yes Vanetta Mulders, MD  venlafaxine XR (EFFEXOR-XR) 150 MG 24 hr capsule Take 1 capsule by mouth daily. 09/11/14  Yes Historical Provider, MD  vitamin B-12 (CYANOCOBALAMIN) 1000 MCG tablet Take 1,000 mcg by mouth daily.   Yes Historical Provider, MD  cephALEXin (KEFLEX) 500 MG capsule Take 1 capsule (500 mg total) by mouth 3 (three) times daily. Patient not taking: Reported on 10/09/2014 12/19/13   Loraine Leriche  Fayrene Fearing, MD  clindamycin (CLEOCIN) 150 MG capsule Take 1 capsule (150 mg total) by mouth every 6 (six) hours. Patient not taking: Reported on 10/09/2014 12/19/13   Rolland Porter, MD  HYDROcodone-acetaminophen (NORCO/VICODIN) 5-325 MG per tablet Take 1 tablet by mouth every 4 (four) hours as needed. Patient not taking: Reported on 10/09/2014 12/19/13   Rolland Porter, MD  ondansetron (ZOFRAN) 4 MG tablet Take 1 tablet (4 mg total) by mouth 4 (four) times daily -  before meals and at bedtime. Patient not taking: Reported on 10/09/2014 11/20/13   West Bali, MD  potassium chloride (K-DUR) 10 MEQ tablet Take 1 tablet (10 mEq total) by mouth 2 (two) times  daily. Patient not taking: Reported on 10/09/2014 12/19/13   Rolland Porter, MD   BP 145/95 mmHg  Pulse 125  Temp(Src) 98.8 F (37.1 C) (Oral)  Resp 24  SpO2 98%  LMP 03/13/2013 Physical Exam  Constitutional: She is oriented to person, place, and time. She appears well-developed and well-nourished. No distress.  HENT:  Head: Normocephalic and atraumatic.  Right Ear: Hearing normal.  Left Ear: Hearing normal.  Nose: Nose normal.  Mouth/Throat: Oropharynx is clear and moist and mucous membranes are normal.  Eyes: Conjunctivae and EOM are normal. Pupils are equal, round, and reactive to light.  Neck: Normal range of motion. Neck supple.  Cardiovascular: Regular rhythm, S1 normal and S2 normal.  Exam reveals no gallop and no friction rub.   No murmur heard. Pulmonary/Chest: Effort normal and breath sounds normal. No respiratory distress. She exhibits no tenderness.  Abdominal: Soft. Normal appearance and bowel sounds are normal. There is no hepatosplenomegaly. There is generalized tenderness. There is no rebound, no guarding, no tenderness at McBurney's point and negative Murphy's sign. No hernia.  Musculoskeletal: Normal range of motion.  Neurological: She is alert and oriented to person, place, and time. She has normal strength. No cranial nerve deficit or sensory deficit. Coordination normal. GCS eye subscore is 4. GCS verbal subscore is 5. GCS motor subscore is 6.  Skin: Skin is warm, dry and intact. No rash noted. No cyanosis.  Psychiatric: She has a normal mood and affect. Her speech is normal and behavior is normal. Thought content normal.  Nursing note and vitals reviewed.   ED Course  Procedures (including critical care time) Labs Review Labs Reviewed - No data to display  Imaging Review Dg Chest 2 View  10/09/2014   CLINICAL DATA:  Abdominal pain  EXAM: CHEST  2 VIEW  COMPARISON:  04/05/2013  FINDINGS: The heart size and mediastinal contours are within normal limits. Both lungs  are clear. The visualized skeletal structures are unremarkable.  IMPRESSION: No active cardiopulmonary disease.   Electronically Signed   By: Marlan Palau M.D.   On: 10/09/2014 14:45   Ct Abdomen Pelvis W Contrast  10/09/2014   CLINICAL DATA:  Prior history pancreatitis, presenting with 2 day history of increasing upper abdominal pain, nausea, and dry heaves.  EXAM: CT ABDOMEN AND PELVIS WITH CONTRAST  TECHNIQUE: Multidetector CT imaging of the abdomen and pelvis was performed using the standard protocol following bolus administration of intravenous contrast.  CONTRAST:  OMNIPAQUE IOHEXOL 300 MG/ML IV. Oral contrast was also administered.  COMPARISON:  MRI abdomen 03/28/2013. CT abdomen pancreatic protocol 02/19/2013, 10/18/2012.  FINDINGS: Liver: Severe diffuse hepatic steatosis with focal sparing around the gallbladder fossa. No focal hepatic parenchymal abnormality.  Biliary: Normal-appearing gallbladder without calcified gallstones. No biliary ductal dilation.  Spleen:  Normal in  size and appearance.  Pancreas: Atrophic body and tail with ductal dilation involving bowl body. No peripancreatic edema or inflammation. No pseudocyst.  Adrenal glands:  Normal in appearance.  Genitourinary: Simple cyst arising from the lateral upper pole of the left kidney, unchanged. No significant abnormalities involving either kidney. No visible urinary tract calculi. Urinary bladder unremarkable.  Retroflexed normal-appearing uterus. No adnexal masses or free pelvic fluid.  Gastrointestinal: Normal appearing stomach and small bowel. Descending colon, sigmoid colon and rectum decompressed. Sigmoid colon redundant. No focal abnormality involving the colon. Cecum positioned in the right mid abdomen, and normal-appearing retrocecal appendix identified in the right upper quadrant.  Vascular: Mild to moderate aortic atherosclerosis with noncalcified plaque. Widely patent visceral arteries. 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  Lymphatic:  No pathologic lymphadenopathy in the abdomen or pelvis.  Ascites: Absent.  Musculoskeletal: Regional skeleton intact.  Other findings: None.  Visualized lower thorax: Heart size normal. Lung bases clear apart from minimal scarring in the lingula and the lower lobes.  IMPRESSION: 1. No acute abnormalities involving the abdomen or pelvis. Specifically, no evidence of acute pancreatitis currently. 2. Marked atrophy of the pancreatic body and tail. 3. Severe diffuse hepatic steatosis without focal hepatic parenchymal abnormality. 4. Mild to moderate aortic atherosclerosis, advanced for age.   Electronically Signed   By: Hulan Saashomas  Lawrence M.D.   On: 10/09/2014 14:39     EKG Interpretation None      MDM   Final diagnoses:  None   chronic pain  Presents to the ER for evaluation of abdominal pain. Patient was seen yesterday and had a thorough workup. Patient had CT scan as well as complete blood work. There were no abnormalities noted. Patient has a history of chronic pain. This is likely secondary to her chronic pain, I do not feel that she requires any further workup today. She was told that she needs to find a chronic pain management doctor, her chronic pain cannot be managed from the emergency department.    Gilda Creasehristopher J Suetta Hoffmeister, MD 10/10/14 [phone]

## 2014-10-10 NOTE — Discharge Instructions (Signed)

## 2014-10-10 NOTE — ED Notes (Signed)
Pt co abdominal pain, states she has pancreatitis and was seen for same yesterday.

## 2014-10-22 DIAGNOSIS — G8929 Other chronic pain: Secondary | ICD-10-CM | POA: Insufficient documentation

## 2014-10-28 ENCOUNTER — Emergency Department (HOSPITAL_COMMUNITY)
Admission: EM | Admit: 2014-10-28 | Discharge: 2014-10-28 | Disposition: A | Discharge status: Home / Self Care | Payer: Self-pay | Attending: Emergency Medicine | Admitting: Emergency Medicine

## 2014-10-28 ENCOUNTER — Encounter (HOSPITAL_COMMUNITY): Payer: Self-pay

## 2014-10-28 DIAGNOSIS — Z79899 Other long term (current) drug therapy: Secondary | ICD-10-CM | POA: Insufficient documentation

## 2014-10-28 DIAGNOSIS — Z872 Personal history of diseases of the skin and subcutaneous tissue: Secondary | ICD-10-CM | POA: Insufficient documentation

## 2014-10-28 DIAGNOSIS — M6281 Muscle weakness (generalized): Secondary | ICD-10-CM | POA: Insufficient documentation

## 2014-10-28 DIAGNOSIS — R300 Dysuria: Secondary | ICD-10-CM | POA: Insufficient documentation

## 2014-10-28 DIAGNOSIS — R197 Diarrhea, unspecified: Secondary | ICD-10-CM | POA: Insufficient documentation

## 2014-10-28 DIAGNOSIS — R5383 Other fatigue: Secondary | ICD-10-CM | POA: Insufficient documentation

## 2014-10-28 DIAGNOSIS — Z8719 Personal history of other diseases of the digestive system: Secondary | ICD-10-CM | POA: Insufficient documentation

## 2014-10-28 DIAGNOSIS — R110 Nausea: Secondary | ICD-10-CM | POA: Insufficient documentation

## 2014-10-28 DIAGNOSIS — Z720 Tobacco use: Secondary | ICD-10-CM | POA: Insufficient documentation

## 2014-10-28 DIAGNOSIS — Z8619 Personal history of other infectious and parasitic diseases: Secondary | ICD-10-CM | POA: Insufficient documentation

## 2014-10-28 DIAGNOSIS — Z87448 Personal history of other diseases of urinary system: Secondary | ICD-10-CM | POA: Insufficient documentation

## 2014-10-28 DIAGNOSIS — G8929 Other chronic pain: Secondary | ICD-10-CM | POA: Insufficient documentation

## 2014-10-28 DIAGNOSIS — Z792 Long term (current) use of antibiotics: Secondary | ICD-10-CM | POA: Insufficient documentation

## 2014-10-28 HISTORY — DX: Other chronic pain: G89.29

## 2014-10-28 LAB — CBC WITH DIFFERENTIAL/PLATELET
BASOS PCT: 0 % (ref 0–1)
Basophils Absolute: 0 10*3/uL (ref 0.0–0.1)
EOS PCT: 4 % (ref 0–5)
Eosinophils Absolute: 0.3 10*3/uL (ref 0.0–0.7)
HCT: 42.9 % (ref 36.0–46.0)
Hemoglobin: 14.2 g/dL (ref 12.0–15.0)
LYMPHS ABS: 1.4 10*3/uL (ref 0.7–4.0)
LYMPHS PCT: 17 % (ref 12–46)
MCH: 32.1 pg (ref 26.0–34.0)
MCHC: 33.1 g/dL (ref 30.0–36.0)
MCV: 97.1 fL (ref 78.0–100.0)
Monocytes Absolute: 0.5 10*3/uL (ref 0.1–1.0)
Monocytes Relative: 6 % (ref 3–12)
Neutro Abs: 6.2 10*3/uL (ref 1.7–7.7)
Neutrophils Relative %: 73 % (ref 43–77)
PLATELETS: 248 10*3/uL (ref 150–400)
RBC: 4.42 MIL/uL (ref 3.87–5.11)
RDW: 13.7 % (ref 11.5–15.5)
WBC: 8.5 10*3/uL (ref 4.0–10.5)

## 2014-10-28 LAB — COMPREHENSIVE METABOLIC PANEL
ALT: 24 U/L (ref 14–54)
AST: 33 U/L (ref 15–41)
Albumin: 4 g/dL (ref 3.5–5.0)
Alkaline Phosphatase: 94 U/L (ref 38–126)
Anion gap: 8 (ref 5–15)
BILIRUBIN TOTAL: 0.5 mg/dL (ref 0.3–1.2)
BUN: 7 mg/dL (ref 6–20)
CALCIUM: 8.5 mg/dL — AB (ref 8.9–10.3)
CO2: 26 mmol/L (ref 22–32)
Chloride: 105 mmol/L (ref 101–111)
Creatinine, Ser: 0.56 mg/dL (ref 0.44–1.00)
GFR calc Af Amer: >60 mL/min (ref >60)
GFR calc non Af Amer: >60 mL/min (ref >60)
GLUCOSE: 94 mg/dL (ref 65–99)
Potassium: 3.9 mmol/L (ref 3.5–5.1)
SODIUM: 139 mmol/L (ref 135–145)
Total Protein: 7.3 g/dL (ref 6.5–8.1)

## 2014-10-28 LAB — URINALYSIS, ROUTINE W REFLEX MICROSCOPIC
Bilirubin Urine: NEGATIVE
Glucose, UA: NEGATIVE mg/dL
HGB URINE DIPSTICK: NEGATIVE
Ketones, ur: NEGATIVE mg/dL
NITRITE: NEGATIVE
PH: 6 (ref 5.0–8.0)
Protein, ur: NEGATIVE mg/dL
SPECIFIC GRAVITY, URINE: 1.01 (ref 1.005–1.030)
Urobilinogen, UA: 0.2 mg/dL (ref 0.0–1.0)

## 2014-10-28 LAB — URINE MICROSCOPIC-ADD ON

## 2014-10-28 LAB — LIPASE, BLOOD: Lipase: 18 U/L — ABNORMAL LOW (ref 22–51)

## 2014-10-28 LAB — AMYLASE: Amylase: 20 U/L — ABNORMAL LOW (ref 28–100)

## 2014-10-28 MED ORDER — DULOXETINE HCL 30 MG PO CPEP: 30.0000 mg | ORAL_CAPSULE | Freq: Every day | ORAL | Status: DC

## 2014-10-28 MED ORDER — SODIUM CHLORIDE 0.9 % IV BOLUS (SEPSIS)
1000.0000 mL | Freq: Once | INTRAVENOUS | Status: AC
  Administered 2014-10-28: 1000 mL via INTRAVENOUS

## 2014-10-28 NOTE — ED Provider Notes (Signed)
CSN: [id number]     Arrival date & time 10/28/14  1900 History  This chart was scribed for Eber HongBrian Frantz Quattrone, MD by Modena JanskyAlbert Thayil, ED Scribe. This patient was seen in room APA16A/[facility]16A and the patient's care was started at 10:04 PM.   Chief Complaint  Patient presents with  . Abdominal Pain   The history is provided by the patient. No language interpreter was used.   HPI Comments: Monique Nguyen is a 44 y.o. female who presents to the Emergency Department complaining of constant moderate generalized weakness that started 4 days ago. She states that she was started on Effexor 2 months ago and then Cymbalta 3 weeks ago in addition to the effexor to treat her depression. She reports that she has no hx of use of these medications prior to 2 months ago. She states that she has been sleeping more than usual, but still able to get out of bed. She reports having fatigue, generalized weakness, dysuria, and nausea. She states that her mouth is dry and had 2 episodes of diarrhea today.  She reports a hx of smoking. She denies any alcohol or other drug use.   Past Medical History  Diagnosis Date  . GERD (gastroesophageal reflux disease)   . Bacterial vaginosis   . Anxiety   . Depression   . Panic attacks   . Psoriasis   . IBS (irritable bowel syndrome)   . Pancreatitis     ETOH  . Chronic pain    Past Surgical History  Procedure Laterality Date  . Bladder surgery      X 3  . Eus N/A 10/11/2012    Procedure: UPPER ENDOSCOPIC ULTRASOUND (EUS) LINEAR;  Surgeon: Rachael Feeaniel P Jacobs, MD;  Location: [facility];  Service: Endoscopy;  Laterality: N/A;  . Esophagogastroduodenoscopy endoscopy     Family History  Problem Relation Age of Onset  . Depression Mother     living  . Pancreatic cancer Father     living  . Colon cancer Neg Hx   . Colon polyps Neg Hx    History  Substance Use Topics  . Smoking status: Current Every Day Smoker -- 1.00 packs/day for 8 years    Types: Cigarettes  . Smokeless  tobacco: Never Used     Comment: Smokes one pack of cigarettes daily  . Alcohol Use: No     Comment: former   OB History    No data available     Review of Systems  Constitutional: Positive for fatigue.  Gastrointestinal: Positive for nausea, abdominal pain and diarrhea.  Genitourinary: Positive for dysuria.  Neurological: Positive for weakness (generalized).  All other systems reviewed and are negative.   Allergies  Doxycycline and Sulfa antibiotics  Home Medications   Prior to Admission medications   Medication Sig Start Date End Date Taking? Authorizing Provider  ALPRAZolam Prudy Feeler(XANAX) 1 MG tablet Take 1 tablet by mouth 2 (two) times daily. 09/15/14  Yes Historical Provider, MD  DIGESTIVE ENZYMES PO Take 1 tablet by mouth 3 (three) times daily before meals.   Yes Historical Provider, MD  estradiol (ESTRACE) 0.5 MG tablet Take 0.5 mg by mouth daily. 09/25/14  Yes Historical Provider, MD  ferrous sulfate 325 (65 FE) MG tablet Take 325 mg by mouth daily.   Yes Historical Provider, MD  HYDROcodone-acetaminophen (NORCO) 10-325 MG per tablet Take 1 tablet by mouth every 6 (six) hours as needed for moderate pain.  09/15/14  Yes Historical Provider, MD  HYDROcodone-acetaminophen (NORCO/VICODIN) 5-325 MG  per tablet Take 1-2 tablets by mouth every 6 (six) hours as needed. 10/09/14  Yes Vanetta MuldersScott Zackowski, MD  promethazine (PHENERGAN) 25 MG tablet Take 1 tablet (25 mg total) by mouth every 6 (six) hours as needed. Patient taking differently: Take 25 mg by mouth every 6 (six) hours as needed for nausea.  10/09/14  Yes Vanetta MuldersScott Zackowski, MD  venlafaxine XR (EFFEXOR-XR) 150 MG 24 hr capsule Take 1 capsule by mouth daily. 09/11/14  Yes Historical Provider, MD  vitamin B-12 (CYANOCOBALAMIN) 1000 MCG tablet Take 1,000 mcg by mouth daily.   Yes Historical Provider, MD  cephALEXin (KEFLEX) 500 MG capsule Take 1 capsule (500 mg total) by mouth 3 (three) times daily. Patient not taking: Reported on 10/09/2014 12/19/13    Rolland PorterMark James, MD  clindamycin (CLEOCIN) 150 MG capsule Take 1 capsule (150 mg total) by mouth every 6 (six) hours. Patient not taking: Reported on 10/09/2014 12/19/13   Rolland PorterMark James, MD  clobetasol (TEMOVATE) 0.05 % external solution Apply 1 application topically 2 (two) times daily.  09/15/14   Historical Provider, MD  DULoxetine (CYMBALTA) 30 MG capsule Take 1 capsule (30 mg total) by mouth daily. 10/28/14   Eber HongBrian Kharter Sestak, MD  HYDROcodone-acetaminophen (NORCO/VICODIN) 5-325 MG per tablet Take 1 tablet by mouth every 4 (four) hours as needed. Patient not taking: Reported on 10/09/2014 12/19/13   Rolland PorterMark James, MD  ondansetron (ZOFRAN) 4 MG tablet Take 1 tablet (4 mg total) by mouth 4 (four) times daily -  before meals and at bedtime. Patient not taking: Reported on 10/09/2014 11/20/13   West BaliSandi L Fields, MD  potassium chloride (K-DUR) 10 MEQ tablet Take 1 tablet (10 mEq total) by mouth 2 (two) times daily. Patient not taking: Reported on 10/09/2014 12/19/13   Rolland PorterMark James, MD   BP 115/92 mmHg  Pulse 102  Temp(Src) 99 F (37.2 C) (Oral)  Resp 20  Ht 5\' 1"  (1.549 m)  Wt 133 lb (60.328 kg)  BMI 25.14 kg/m2  SpO2 100%  LMP 03/13/2013 Physical Exam  Constitutional: She appears well-developed and well-nourished. No distress.  HENT:  Head: Normocephalic and atraumatic.  Mouth/Throat: Oropharynx is clear and moist. No oropharyngeal exudate.  Dry mucous membranes.   Eyes: Conjunctivae and EOM are normal. Pupils are equal, round, and reactive to light. Right eye exhibits no discharge. Left eye exhibits no discharge. No scleral icterus.  Neck: Normal range of motion. Neck supple. No JVD present. No thyromegaly present.  Cardiovascular: Normal rate, regular rhythm, normal heart sounds and intact distal pulses.  Exam reveals no gallop and no friction rub.   No murmur heard. Pulmonary/Chest: Effort normal and breath sounds normal. No respiratory distress. She has no wheezes. She has no rales.  Abdominal: Soft. Bowel  sounds are normal. She exhibits no distension and no mass. There is no tenderness.  Musculoskeletal: Normal range of motion. She exhibits no edema or tenderness.  Lymphadenopathy:    She has no cervical adenopathy.  Neurological: She is alert. Coordination normal.  Skin: Skin is warm and dry. No rash noted. No erythema.  Psychiatric: She has a normal mood and affect. Her behavior is normal.  Nursing note and vitals reviewed.   ED Course  Procedures (including critical care time) DIAGNOSTIC STUDIES: Oxygen Saturation is 100% on RA, normal by my interpretation.    COORDINATION OF CARE: 10:08 PM- Pt advised of plan for treatment which includes medication and labs and pt agrees.  Labs Review Labs Reviewed  COMPREHENSIVE METABOLIC PANEL - Abnormal; Notable  for the following:    Calcium 8.5 (*)    All other components within normal limits  AMYLASE - Abnormal; Notable for the following:    Amylase 20 (*)    All other components within normal limits  LIPASE, BLOOD - Abnormal; Notable for the following:    Lipase 18 (*)    All other components within normal limits  URINALYSIS, ROUTINE W REFLEX MICROSCOPIC - Abnormal; Notable for the following:    Leukocytes, UA TRACE (*)    All other components within normal limits  URINE MICROSCOPIC-ADD ON - Abnormal; Notable for the following:    Squamous Epithelial / LPF FEW (*)    Bacteria, UA FEW (*)    All other components within normal limits  CBC WITH DIFFERENTIAL/PLATELET  TSH    Imaging Review No results found.   MDM   Final diagnoses:  Other fatigue    The patient's mood and affect are normal, she has been awake each time that I have come into the room, her tachycardia has resolved, vital signs are normal and her blood work is normal at her baseline. At this time the patient appears stable for discharge to follow-up with her family doctor. I have given her a copy of her results, thyroid studies are pending, I suspect this is  related to use of selective serotonin reuptake inhibitors and antidepressant, I have requested that she cut her Cymbalta dose in half and take for one week before stopping this medication completely. I have also asked her to call her doctor in the morning to discuss how to get off of this medication under his guidance, she is in agreement with the plan.  I personally performed the services described in this documentation, which was scribed in my presence. The recorded information has been reviewed and is accurate.       Eber Hong, MD 10/28/14 [phone]

## 2014-10-28 NOTE — Discharge Instructions (Signed)
Please take half the dose of the Cymbalta for the next week - then stop taking this medication - you MUST call your doctor in the morning to discuss the results that I have printed and given you and discuss the medication changes.  Please call your doctor for a followup appointment within 24-48 hours. When you talk to your doctor please let them know that you were seen in the emergency department and have them acquire all of your records so that they can discuss the findings with you and formulate a treatment plan to fully care for your new and ongoing problems.

## 2014-10-28 NOTE — ED Notes (Signed)
I have been sleeping for 4 days straight per pt. I have abdominal pain and I am weak per pt. I don't understand why I am sleeping so much per pt.

## 2014-10-29 LAB — TSH: TSH: 2.541 u[IU]/mL (ref 0.350–4.500)

## 2014-12-24 ENCOUNTER — Encounter (HOSPITAL_COMMUNITY): Payer: Self-pay | Admitting: Emergency Medicine

## 2014-12-24 ENCOUNTER — Emergency Department (HOSPITAL_COMMUNITY)
Admission: EM | Admit: 2014-12-24 | Discharge: 2014-12-24 | Disposition: A | Discharge status: Home / Self Care | Payer: Self-pay | Attending: Emergency Medicine | Admitting: Emergency Medicine

## 2014-12-24 DIAGNOSIS — Z79899 Other long term (current) drug therapy: Secondary | ICD-10-CM | POA: Insufficient documentation

## 2014-12-24 DIAGNOSIS — Z872 Personal history of diseases of the skin and subcutaneous tissue: Secondary | ICD-10-CM | POA: Insufficient documentation

## 2014-12-24 DIAGNOSIS — N390 Urinary tract infection, site not specified: Secondary | ICD-10-CM | POA: Insufficient documentation

## 2014-12-24 DIAGNOSIS — F329 Major depressive disorder, single episode, unspecified: Secondary | ICD-10-CM | POA: Insufficient documentation

## 2014-12-24 DIAGNOSIS — Z8742 Personal history of other diseases of the female genital tract: Secondary | ICD-10-CM | POA: Insufficient documentation

## 2014-12-24 DIAGNOSIS — G8929 Other chronic pain: Secondary | ICD-10-CM | POA: Insufficient documentation

## 2014-12-24 DIAGNOSIS — Z8719 Personal history of other diseases of the digestive system: Secondary | ICD-10-CM | POA: Insufficient documentation

## 2014-12-24 DIAGNOSIS — F410 Panic disorder [episodic paroxysmal anxiety] without agoraphobia: Secondary | ICD-10-CM | POA: Insufficient documentation

## 2014-12-24 DIAGNOSIS — Z720 Tobacco use: Secondary | ICD-10-CM | POA: Insufficient documentation

## 2014-12-24 LAB — URINALYSIS, ROUTINE W REFLEX MICROSCOPIC
Bilirubin Urine: NEGATIVE
Glucose, UA: 250 mg/dL — AB
NITRITE: POSITIVE — AB
PH: 5 (ref 5.0–8.0)
PROTEIN: 30 mg/dL — AB
Specific Gravity, Urine: <1.005 — ABNORMAL LOW (ref 1.005–1.030)
UROBILINOGEN UA: 4 mg/dL — AB (ref 0.0–1.0)

## 2014-12-24 LAB — URINE MICROSCOPIC-ADD ON

## 2014-12-24 MED ORDER — CIPROFLOXACIN HCL 250 MG PO TABS
500.0000 mg | ORAL_TABLET | Freq: Once | ORAL | Status: AC
  Administered 2014-12-24: 500 mg via ORAL
  Filled 2014-12-24: qty 2

## 2014-12-24 MED ORDER — KETOROLAC TROMETHAMINE 30 MG/ML IJ SOLN
30.0000 mg | Freq: Once | INTRAMUSCULAR | Status: AC
  Administered 2014-12-24: 30 mg via INTRAVENOUS
  Filled 2014-12-24: qty 1

## 2014-12-24 MED ORDER — ONDANSETRON HCL 8 MG PO TABS: 8.0000 mg | ORAL_TABLET | ORAL | Status: DC | PRN

## 2014-12-24 MED ORDER — SODIUM CHLORIDE 0.9 % IV BOLUS (SEPSIS)
1000.0000 mL | Freq: Once | INTRAVENOUS | Status: AC
  Administered 2014-12-24: 1000 mL via INTRAVENOUS

## 2014-12-24 MED ORDER — ONDANSETRON HCL 4 MG/2ML IJ SOLN
4.0000 mg | Freq: Once | INTRAMUSCULAR | Status: AC
  Administered 2014-12-24: 4 mg via INTRAVENOUS
  Filled 2014-12-24: qty 2

## 2014-12-24 MED ORDER — CIPROFLOXACIN HCL 500 MG PO TABS: 500.0000 mg | ORAL_TABLET | Freq: Two times a day (BID) | ORAL | Status: DC

## 2014-12-24 MED ORDER — PROMETHAZINE HCL 25 MG PO TABS: 25.0000 mg | ORAL_TABLET | Freq: Four times a day (QID) | ORAL | Status: DC | PRN

## 2014-12-24 MED ORDER — TRAMADOL HCL 50 MG PO TABS: 50.0000 mg | ORAL_TABLET | Freq: Four times a day (QID) | ORAL | Status: DC | PRN

## 2014-12-24 NOTE — ED Provider Notes (Signed)
CSN: [id number]     Arrival date & time 12/24/14  1319 History   First MD Initiated Contact with Patient 12/24/14 1509     Chief Complaint  Patient presents with  . Hematuria     (Consider location/radiation/quality/duration/timing/severity/associated sxs/prior Treatment) HPI..... Dysuria, suprapubic discomfort for 2 days. No vaginal bleeding, vaginal discharge, hematuria, flank pain, fever, chills. Pain feels like a razor burn.  Patient had a history of urinary tract infection. Decreased oral intake. Severity is mild to moderate. Nothing makes symptoms better or worse  Past Medical History  Diagnosis Date  . GERD (gastroesophageal reflux disease)   . Bacterial vaginosis   . Anxiety   . Depression   . Panic attacks   . Psoriasis   . IBS (irritable bowel syndrome)   . Pancreatitis     ETOH  . Chronic pain    Past Surgical History  Procedure Laterality Date  . Bladder surgery      X 3  . Eus N/A 10/11/2012    Procedure: UPPER ENDOSCOPIC ULTRASOUND (EUS) LINEAR;  Surgeon: Rachael Fee, MD;  Location: [facility];  Service: Endoscopy;  Laterality: N/A;  . Esophagogastroduodenoscopy endoscopy     Family History  Problem Relation Age of Onset  . Depression Mother     living  . Pancreatic cancer Father     living  . Colon cancer Neg Hx   . Colon polyps Neg Hx    History  Substance Use Topics  . Smoking status: Current Every Day Smoker -- 1.00 packs/day for 8 years    Types: Cigarettes  . Smokeless tobacco: Never Used     Comment: Smokes one pack of cigarettes daily  . Alcohol Use: No     Comment: former   OB History    No data available     Review of Systems  All other systems reviewed and are negative.     Allergies  Doxycycline and Sulfa antibiotics  Home Medications   Prior to Admission medications   Medication Sig Start Date End Date Taking? Authorizing Provider  ALPRAZolam Prudy Feeler) 1 MG tablet Take 1 tablet by mouth 2 (two) times daily. 09/15/14  Yes  Historical Provider, MD  clobetasol (TEMOVATE) 0.05 % external solution Apply 1 application topically 2 (two) times daily.  09/15/14  Yes Historical Provider, MD  Cyanocobalamin (VITAMIN B-12) 1000 MCG SUBL Place 1 tablet under the tongue daily.   Yes Historical Provider, MD  DIGESTIVE ENZYMES PO Take 1 tablet by mouth 3 (three) times daily before meals.   Yes Historical Provider, MD  DULoxetine (CYMBALTA) 30 MG capsule Take 1 capsule (30 mg total) by mouth daily. 10/28/14  Yes Eber Hong, MD  estradiol (ESTRACE) 0.5 MG tablet Take 0.5 mg by mouth daily. 09/25/14  Yes Historical Provider, MD  ferrous sulfate 325 (65 FE) MG tablet Take 325 mg by mouth daily.   Yes Historical Provider, MD  Phenazopyrid-Cranbry-C-Probiot (AZO URINARY TRACT SUPPORT PO) Take 4 tablets by mouth daily as needed (UTI).   Yes Historical Provider, MD  venlafaxine XR (EFFEXOR-XR) 150 MG 24 hr capsule Take 1 capsule by mouth daily. 09/11/14  Yes Historical Provider, MD  cephALEXin (KEFLEX) 500 MG capsule Take 1 capsule (500 mg total) by mouth 3 (three) times daily. Patient not taking: Reported on 10/09/2014 12/19/13   Rolland Porter, MD  ciprofloxacin (CIPRO) 500 MG tablet Take 1 tablet (500 mg total) by mouth 2 (two) times daily. 12/24/14   Donnetta Hutching, MD  clindamycin (CLEOCIN) 150  MG capsule Take 1 capsule (150 mg total) by mouth every 6 (six) hours. Patient not taking: Reported on 10/09/2014 12/19/13   Rolland PorterMark James, MD  HYDROcodone-acetaminophen (NORCO/VICODIN) 5-325 MG per tablet Take 1 tablet by mouth every 4 (four) hours as needed. Patient not taking: Reported on 10/09/2014 12/19/13   Rolland PorterMark James, MD  HYDROcodone-acetaminophen (NORCO/VICODIN) 5-325 MG per tablet Take 1-2 tablets by mouth every 6 (six) hours as needed. Patient not taking: Reported on 12/24/2014 10/09/14   Vanetta MuldersScott Zackowski, MD  ondansetron (ZOFRAN) 4 MG tablet Take 1 tablet (4 mg total) by mouth 4 (four) times daily -  before meals and at bedtime. Patient not taking: Reported  on 10/09/2014 11/20/13   West BaliSandi L Fields, MD  potassium chloride (K-DUR) 10 MEQ tablet Take 1 tablet (10 mEq total) by mouth 2 (two) times daily. Patient not taking: Reported on 10/09/2014 12/19/13   Rolland PorterMark James, MD  promethazine (PHENERGAN) 25 MG tablet Take 1 tablet (25 mg total) by mouth every 6 (six) hours as needed. 12/24/14   Donnetta HutchingBrian Xian Apostol, MD  traMADol (ULTRAM) 50 MG tablet Take 1 tablet (50 mg total) by mouth every 6 (six) hours as needed. 12/24/14   Donnetta HutchingBrian Destyne Goodreau, MD   BP 119/86 mmHg  Pulse 110  Temp(Src) 98.3 F (36.8 C) (Oral)  Resp 20  Ht 5\' 1"  (1.549 m)  Wt 136 lb (61.689 kg)  BMI 25.71 kg/m2  SpO2 98%  LMP 03/13/2013 Physical Exam  Constitutional: She is oriented to person, place, and time. She appears well-developed and well-nourished.  HENT:  Head: Normocephalic and atraumatic.  Eyes: Conjunctivae and EOM are normal. Pupils are equal, round, and reactive to light.  Neck: Normal range of motion. Neck supple.  Cardiovascular: Normal rate and regular rhythm.   Pulmonary/Chest: Effort normal and breath sounds normal.  Abdominal: Soft. Bowel sounds are normal.  Slight suprapubic tenderness  Genitourinary:  No flank tenderness  Musculoskeletal: Normal range of motion.  Neurological: She is alert and oriented to person, place, and time.  Skin: Skin is warm and dry.  Psychiatric: She has a normal mood and affect. Her behavior is normal.  Nursing note and vitals reviewed.   ED Course  Procedures (including critical care time) Labs Review Labs Reviewed  URINALYSIS, ROUTINE W REFLEX MICROSCOPIC (NOT AT [facility]) - Abnormal; Notable for the following:    Color, Urine ORANGE (*)    Specific Gravity, Urine <1.005 (*)    Glucose, UA 250 (*)    Hgb urine dipstick LARGE (*)    Ketones, ur TRACE (*)    Protein, ur 30 (*)    Urobilinogen, UA 4.0 (*)    Nitrite POSITIVE (*)    Leukocytes, UA LARGE (*)    All other components within normal limits  URINE MICROSCOPIC-ADD ON - Abnormal;  Notable for the following:    Squamous Epithelial / LPF FEW (*)    Bacteria, UA MANY (*)    All other components within normal limits    Imaging Review No results found.   EKG Interpretation None      MDM   Final diagnoses:  UTI (lower urinary tract infection)    No acute abdomen. Urinalysis grossly infected.  Rx IV fluids, oral Cipro. Discharge medications ciprofloxacin 500 mg, tramadol, Phenergan 25 mg    Donnetta HutchingBrian Liliane Mallis, MD 12/24/14 1643

## 2014-12-24 NOTE — ED Notes (Signed)
Patient with no complaints at this time. Respirations even and unlabored. Skin warm/dry. Discharge instructions reviewed with patient at this time. Patient given opportunity to voice concerns/ask questions. IV removed per policy and band-aid applied to site. Patient discharged at this time and left Emergency Department with steady gait.  

## 2014-12-24 NOTE — Discharge Instructions (Signed)
You have a urinary tract infection. Increase fluids. Prescriptions for antibiotic, pain medicine, nausea medicine.

## 2014-12-24 NOTE — ED Notes (Signed)
Pt reports lower abdominal pain,nausea, dry heaves, dysuria x3 days.

## 2015-02-27 ENCOUNTER — Encounter (HOSPITAL_COMMUNITY): Payer: Self-pay | Admitting: Emergency Medicine

## 2015-02-27 ENCOUNTER — Emergency Department (HOSPITAL_COMMUNITY)
Admission: EM | Admit: 2015-02-27 | Discharge: 2015-02-27 | Disposition: A | Discharge status: Home / Self Care | Payer: Self-pay | Attending: Physician Assistant | Admitting: Physician Assistant

## 2015-02-27 DIAGNOSIS — R1012 Left upper quadrant pain: Secondary | ICD-10-CM | POA: Insufficient documentation

## 2015-02-27 DIAGNOSIS — Z720 Tobacco use: Secondary | ICD-10-CM | POA: Insufficient documentation

## 2015-02-27 DIAGNOSIS — R000 Tachycardia, unspecified: Secondary | ICD-10-CM | POA: Insufficient documentation

## 2015-02-27 DIAGNOSIS — Z792 Long term (current) use of antibiotics: Secondary | ICD-10-CM | POA: Insufficient documentation

## 2015-02-27 DIAGNOSIS — Z8619 Personal history of other infectious and parasitic diseases: Secondary | ICD-10-CM | POA: Insufficient documentation

## 2015-02-27 DIAGNOSIS — G8929 Other chronic pain: Secondary | ICD-10-CM | POA: Insufficient documentation

## 2015-02-27 DIAGNOSIS — R197 Diarrhea, unspecified: Secondary | ICD-10-CM | POA: Insufficient documentation

## 2015-02-27 DIAGNOSIS — F410 Panic disorder [episodic paroxysmal anxiety] without agoraphobia: Secondary | ICD-10-CM | POA: Insufficient documentation

## 2015-02-27 DIAGNOSIS — Z793 Long term (current) use of hormonal contraceptives: Secondary | ICD-10-CM | POA: Insufficient documentation

## 2015-02-27 DIAGNOSIS — Z79899 Other long term (current) drug therapy: Secondary | ICD-10-CM | POA: Insufficient documentation

## 2015-02-27 DIAGNOSIS — F329 Major depressive disorder, single episode, unspecified: Secondary | ICD-10-CM | POA: Insufficient documentation

## 2015-02-27 DIAGNOSIS — Z7952 Long term (current) use of systemic steroids: Secondary | ICD-10-CM | POA: Insufficient documentation

## 2015-02-27 DIAGNOSIS — R569 Unspecified convulsions: Secondary | ICD-10-CM | POA: Insufficient documentation

## 2015-02-27 DIAGNOSIS — R112 Nausea with vomiting, unspecified: Secondary | ICD-10-CM | POA: Insufficient documentation

## 2015-02-27 DIAGNOSIS — K589 Irritable bowel syndrome without diarrhea: Secondary | ICD-10-CM | POA: Insufficient documentation

## 2015-02-27 DIAGNOSIS — Z872 Personal history of diseases of the skin and subcutaneous tissue: Secondary | ICD-10-CM | POA: Insufficient documentation

## 2015-02-27 LAB — CBC WITH DIFFERENTIAL/PLATELET
BASOS PCT: 0 %
Basophils Absolute: 0 10*3/uL (ref 0.0–0.1)
EOS ABS: 0.4 10*3/uL (ref 0.0–0.7)
Eosinophils Relative: 6 %
HCT: 42.4 % (ref 36.0–46.0)
HEMOGLOBIN: 14.3 g/dL (ref 12.0–15.0)
Lymphocytes Relative: 19 %
Lymphs Abs: 1.2 10*3/uL (ref 0.7–4.0)
MCH: 32.6 pg (ref 26.0–34.0)
MCHC: 33.7 g/dL (ref 30.0–36.0)
MCV: 96.8 fL (ref 78.0–100.0)
Monocytes Absolute: 0.3 10*3/uL (ref 0.1–1.0)
Monocytes Relative: 5 %
NEUTROS PCT: 70 %
Neutro Abs: 4.4 10*3/uL (ref 1.7–7.7)
Platelets: 222 10*3/uL (ref 150–400)
RBC: 4.38 MIL/uL (ref 3.87–5.11)
RDW: 13.1 % (ref 11.5–15.5)
WBC: 6.3 10*3/uL (ref 4.0–10.5)

## 2015-02-27 LAB — COMPREHENSIVE METABOLIC PANEL
ALBUMIN: 4.1 g/dL (ref 3.5–5.0)
ALT: 23 U/L (ref 14–54)
ANION GAP: 7 (ref 5–15)
AST: 37 U/L (ref 15–41)
Alkaline Phosphatase: 94 U/L (ref 38–126)
BUN: 8 mg/dL (ref 6–20)
CHLORIDE: 104 mmol/L (ref 101–111)
CO2: 28 mmol/L (ref 22–32)
CREATININE: 0.57 mg/dL (ref 0.44–1.00)
Calcium: 8.8 mg/dL — ABNORMAL LOW (ref 8.9–10.3)
GFR calc Af Amer: >60 mL/min (ref >60)
GFR calc non Af Amer: >60 mL/min (ref >60)
Glucose, Bld: 127 mg/dL — ABNORMAL HIGH (ref 65–99)
Potassium: 3.7 mmol/L (ref 3.5–5.1)
SODIUM: 139 mmol/L (ref 135–145)
Total Bilirubin: 0.4 mg/dL (ref 0.3–1.2)
Total Protein: 7.3 g/dL (ref 6.5–8.1)

## 2015-02-27 LAB — LIPASE, BLOOD: Lipase: 10 U/L — ABNORMAL LOW (ref 22–51)

## 2015-02-27 LAB — HCG, SERUM, QUALITATIVE: PREG SERUM: NEGATIVE

## 2015-02-27 MED ORDER — ONDANSETRON HCL 4 MG/2ML IJ SOLN
INTRAMUSCULAR | Status: AC
  Filled 2015-02-27: qty 2

## 2015-02-27 MED ORDER — ONDANSETRON HCL 4 MG PO TABS: 4.0000 mg | ORAL_TABLET | Freq: Three times a day (TID) | ORAL | Status: DC | PRN

## 2015-02-27 MED ORDER — PROCHLORPERAZINE EDISYLATE 5 MG/ML IJ SOLN
10.0000 mg | Freq: Once | INTRAMUSCULAR | Status: AC
  Administered 2015-02-27: 10 mg via INTRAVENOUS
  Filled 2015-02-27: qty 2

## 2015-02-27 MED ORDER — ACETAMINOPHEN 325 MG PO TABS
650.0000 mg | ORAL_TABLET | Freq: Once | ORAL | Status: AC
  Administered 2015-02-27: 650 mg via ORAL
  Filled 2015-02-27: qty 2

## 2015-02-27 MED ORDER — ONDANSETRON HCL 4 MG/2ML IJ SOLN
4.0000 mg | Freq: Once | INTRAMUSCULAR | Status: AC
  Administered 2015-02-27: 4 mg via INTRAVENOUS

## 2015-02-27 MED ORDER — SODIUM CHLORIDE 0.9 % IV BOLUS (SEPSIS)
1000.0000 mL | Freq: Once | INTRAVENOUS | Status: AC
  Administered 2015-02-27: 1000 mL via INTRAVENOUS

## 2015-02-27 MED ORDER — KETOROLAC TROMETHAMINE 30 MG/ML IJ SOLN
30.0000 mg | Freq: Once | INTRAMUSCULAR | Status: AC
  Administered 2015-02-27: 30 mg via INTRAVENOUS
  Filled 2015-02-27: qty 1

## 2015-02-27 NOTE — ED Notes (Signed)
Patient complaining of vomiting and abdominal pain x 2 days.  

## 2015-02-27 NOTE — Discharge Instructions (Signed)

## 2015-02-27 NOTE — ED Provider Notes (Signed)
CSN: [id number]     Arrival date & time 02/27/15  1610 History   First MD Initiated Contact with Patient 02/27/15 1625     Chief Complaint  Patient presents with  . Emesis  . Abdominal Pain     (Consider location/radiation/quality/duration/timing/severity/associated sxs/prior Treatment) HPI    Patient is a 44 year old female with past history significant for IBS pancreatitis (alcohol induced), chronic pain resenting today with nausea, vomiting, diarrhea for the last 2 days. Patient's age she's been unable to keep much down. She feels she has been able unable to keep hydrated. Denies any sick contacts. Denies any fevers.  Patient is tearful on exam about the loss of her husband several years ago and why she turned to alcohol and therefore, pancreatitis. Patient denies any drinking currently.  Past Medical History  Diagnosis Date  . GERD (gastroesophageal reflux disease)   . Bacterial vaginosis   . Anxiety   . Depression   . Panic attacks   . Psoriasis   . IBS (irritable bowel syndrome)   . Pancreatitis     ETOH  . Chronic pain    Past Surgical History  Procedure Laterality Date  . Bladder surgery      X 3  . Eus N/A 10/11/2012    Procedure: UPPER ENDOSCOPIC ULTRASOUND (EUS) LINEAR;  Surgeon: Rachael Fee, MD;  Location: [facility];  Service: Endoscopy;  Laterality: N/A;  . Esophagogastroduodenoscopy endoscopy     Family History  Problem Relation Age of Onset  . Depression Mother     living  . Pancreatic cancer Father     living  . Colon cancer Neg Hx   . Colon polyps Neg Hx    Social History  Substance Use Topics  . Smoking status: Current Every Day Smoker -- 1.00 packs/day for 8 years    Types: Cigarettes  . Smokeless tobacco: Never Used     Comment: Smokes one pack of cigarettes daily  . Alcohol Use: No     Comment: former   OB History    No data available     Review of Systems  Constitutional: Negative for activity change and fatigue.  HENT:  Negative for congestion and drooling.   Eyes: Negative for discharge.  Respiratory: Negative for cough and chest tightness.   Cardiovascular: Negative for chest pain.  Gastrointestinal: Positive for nausea, vomiting, abdominal pain and diarrhea. Negative for abdominal distention.  Genitourinary: Negative for dysuria and difficulty urinating.  Musculoskeletal: Negative for joint swelling.  Skin: Negative for rash.  Allergic/Immunologic: Negative for immunocompromised state.  Neurological: Positive for seizures. Negative for weakness.  Psychiatric/Behavioral: Negative for behavioral problems and agitation.      Allergies  Doxycycline and Sulfa antibiotics  Home Medications   Prior to Admission medications   Medication Sig Start Date End Date Taking? Authorizing Provider  ALPRAZolam Prudy Feeler) 1 MG tablet Take 1 tablet by mouth 2 (two) times daily. 09/15/14   Historical Provider, MD  cephALEXin (KEFLEX) 500 MG capsule Take 1 capsule (500 mg total) by mouth 3 (three) times daily. Patient not taking: Reported on 10/09/2014 12/19/13   Rolland Porter, MD  ciprofloxacin (CIPRO) 500 MG tablet Take 1 tablet (500 mg total) by mouth 2 (two) times daily. 12/24/14   Donnetta Hutching, MD  clindamycin (CLEOCIN) 150 MG capsule Take 1 capsule (150 mg total) by mouth every 6 (six) hours. Patient not taking: Reported on 10/09/2014 12/19/13   Rolland Porter, MD  clobetasol (TEMOVATE) 0.05 % external solution Apply 1 application  topically 2 (two) times daily.  09/15/14   Historical Provider, MD  Cyanocobalamin (VITAMIN B-12) 1000 MCG SUBL Place 1 tablet under the tongue daily.    Historical Provider, MD  DIGESTIVE ENZYMES PO Take 1 tablet by mouth 3 (three) times daily before meals.    Historical Provider, MD  DULoxetine (CYMBALTA) 30 MG capsule Take 1 capsule (30 mg total) by mouth daily. 10/28/14   Eber Hong, MD  estradiol (ESTRACE) 0.5 MG tablet Take 0.5 mg by mouth daily. 09/25/14   Historical Provider, MD  ferrous sulfate 325  (65 FE) MG tablet Take 325 mg by mouth daily.    Historical Provider, MD  HYDROcodone-acetaminophen (NORCO/VICODIN) 5-325 MG per tablet Take 1 tablet by mouth every 4 (four) hours as needed. Patient not taking: Reported on 10/09/2014 12/19/13   Rolland Porter, MD  HYDROcodone-acetaminophen (NORCO/VICODIN) 5-325 MG per tablet Take 1-2 tablets by mouth every 6 (six) hours as needed. Patient not taking: Reported on 12/24/2014 10/09/14   Vanetta Mulders, MD  ondansetron (ZOFRAN) 8 MG tablet Take 1 tablet (8 mg total) by mouth every 4 (four) hours as needed. 12/24/14   Donnetta Hutching, MD  Phenazopyrid-Cranbry-C-Probiot (AZO URINARY TRACT SUPPORT PO) Take 4 tablets by mouth daily as needed (UTI).    Historical Provider, MD  potassium chloride (K-DUR) 10 MEQ tablet Take 1 tablet (10 mEq total) by mouth 2 (two) times daily. Patient not taking: Reported on 10/09/2014 12/19/13   Rolland Porter, MD  promethazine (PHENERGAN) 25 MG tablet Take 1 tablet (25 mg total) by mouth every 6 (six) hours as needed. 12/24/14   Donnetta Hutching, MD  traMADol (ULTRAM) 50 MG tablet Take 1 tablet (50 mg total) by mouth every 6 (six) hours as needed. 12/24/14   Donnetta Hutching, MD  venlafaxine XR (EFFEXOR-XR) 150 MG 24 hr capsule Take 1 capsule by mouth daily. 09/11/14   Historical Provider, MD   BP 114/77 mmHg  Pulse 114  Temp(Src) 98.3 F (36.8 C) (Oral)  Resp 20  Ht  (1.549 m)  Wt 148 lb (67.132 kg)  BMI 27.98 kg/m2  SpO2 100%  LMP 03/13/2013 Physical Exam  Constitutional: She is oriented to person, place, and time. She appears well-developed and well-nourished.  HENT:  Head: Normocephalic and atraumatic.  Eyes: Conjunctivae are normal. Right eye exhibits no discharge.  Neck: Neck supple.  Cardiovascular: Regular rhythm and normal heart sounds.   No murmur heard. Tachycardic  Pulmonary/Chest: Effort normal and breath sounds normal. She has no wheezes. She has no rales.  Abdominal: Soft. She exhibits no distension.  Mild left upper  quadrant tenderness.  Musculoskeletal: Normal range of motion. She exhibits no edema.  Neurological: She is oriented to person, place, and time. No cranial nerve deficit.  Skin: Skin is warm and dry. No rash noted. She is not diaphoretic.  Psychiatric: She has a normal mood and affect. Her behavior is normal.  Nursing note and vitals reviewed.   ED Course  Procedures (including critical care time) Labs Review Labs Reviewed  CBC WITH DIFFERENTIAL/PLATELET  COMPREHENSIVE METABOLIC PANEL  LIPASE, BLOOD  HCG, SERUM, QUALITATIVE    Imaging Review No results found. I have personally reviewed and evaluated these images and lab results as part of my medical decision-making.   EKG Interpretation None      MDM   Final diagnoses:  None   patient is a 44 year old female presenting today with nausea vomiting diarrhea for 2 days. Patient also has mild abdominal pain. Think this likely  represents viral gastroenteritis given the diarrhea in addition to the n/v. We will give patient a couple liters of fluid, make sure the electrolytes are balanced. We'll give her Zofran. Anticipate ability to discharge home.  Doubt any pancreatic issues given she is not a drinker and this is remote past.  Abelino Derrick, MD 02/27/15 [phone]

## 2015-03-06 ENCOUNTER — Emergency Department (HOSPITAL_COMMUNITY)
Admission: EM | Admit: 2015-03-06 | Discharge: 2015-03-06 | Disposition: A | Discharge status: Home / Self Care | Payer: Self-pay | Attending: Emergency Medicine | Admitting: Emergency Medicine

## 2015-03-06 ENCOUNTER — Emergency Department (HOSPITAL_COMMUNITY): Payer: Self-pay

## 2015-03-06 ENCOUNTER — Encounter (HOSPITAL_COMMUNITY): Payer: Self-pay | Admitting: *Deleted

## 2015-03-06 DIAGNOSIS — E876 Hypokalemia: Secondary | ICD-10-CM | POA: Insufficient documentation

## 2015-03-06 DIAGNOSIS — F329 Major depressive disorder, single episode, unspecified: Secondary | ICD-10-CM | POA: Insufficient documentation

## 2015-03-06 DIAGNOSIS — R1111 Vomiting without nausea: Secondary | ICD-10-CM | POA: Insufficient documentation

## 2015-03-06 DIAGNOSIS — Z720 Tobacco use: Secondary | ICD-10-CM | POA: Insufficient documentation

## 2015-03-06 DIAGNOSIS — Z872 Personal history of diseases of the skin and subcutaneous tissue: Secondary | ICD-10-CM | POA: Insufficient documentation

## 2015-03-06 DIAGNOSIS — R197 Diarrhea, unspecified: Secondary | ICD-10-CM | POA: Insufficient documentation

## 2015-03-06 DIAGNOSIS — Z79899 Other long term (current) drug therapy: Secondary | ICD-10-CM | POA: Insufficient documentation

## 2015-03-06 DIAGNOSIS — Z8742 Personal history of other diseases of the female genital tract: Secondary | ICD-10-CM | POA: Insufficient documentation

## 2015-03-06 DIAGNOSIS — R109 Unspecified abdominal pain: Secondary | ICD-10-CM | POA: Insufficient documentation

## 2015-03-06 DIAGNOSIS — G8929 Other chronic pain: Secondary | ICD-10-CM | POA: Insufficient documentation

## 2015-03-06 DIAGNOSIS — F419 Anxiety disorder, unspecified: Secondary | ICD-10-CM | POA: Insufficient documentation

## 2015-03-06 DIAGNOSIS — Z8719 Personal history of other diseases of the digestive system: Secondary | ICD-10-CM | POA: Insufficient documentation

## 2015-03-06 LAB — COMPREHENSIVE METABOLIC PANEL
ALT: 27 U/L (ref 14–54)
AST: 39 U/L (ref 15–41)
Albumin: 4.2 g/dL (ref 3.5–5.0)
Alkaline Phosphatase: 80 U/L (ref 38–126)
Anion gap: 8 (ref 5–15)
BILIRUBIN TOTAL: 0.8 mg/dL (ref 0.3–1.2)
BUN: 14 mg/dL (ref 6–20)
CHLORIDE: 108 mmol/L (ref 101–111)
CO2: 25 mmol/L (ref 22–32)
Calcium: 8.6 mg/dL — ABNORMAL LOW (ref 8.9–10.3)
Creatinine, Ser: 0.49 mg/dL (ref 0.44–1.00)
GFR calc Af Amer: >60 mL/min (ref >60)
GFR calc non Af Amer: >60 mL/min (ref >60)
GLUCOSE: 112 mg/dL — AB (ref 65–99)
POTASSIUM: 3.2 mmol/L — AB (ref 3.5–5.1)
Sodium: 141 mmol/L (ref 135–145)
TOTAL PROTEIN: 7.3 g/dL (ref 6.5–8.1)

## 2015-03-06 LAB — CBC WITH DIFFERENTIAL/PLATELET
BASOS PCT: 0 %
Basophils Absolute: 0 10*3/uL (ref 0.0–0.1)
Eosinophils Absolute: 0 10*3/uL (ref 0.0–0.7)
Eosinophils Relative: 0 %
HCT: 41.1 % (ref 36.0–46.0)
Hemoglobin: 14.2 g/dL (ref 12.0–15.0)
Lymphocytes Relative: 13 %
Lymphs Abs: 1.1 10*3/uL (ref 0.7–4.0)
MCH: 32.7 pg (ref 26.0–34.0)
MCHC: 34.5 g/dL (ref 30.0–36.0)
MCV: 94.7 fL (ref 78.0–100.0)
MONO ABS: 0.6 10*3/uL (ref 0.1–1.0)
Monocytes Relative: 7 %
NEUTROS PCT: 80 %
Neutro Abs: 6.9 10*3/uL (ref 1.7–7.7)
Platelets: 228 10*3/uL (ref 150–400)
RBC: 4.34 MIL/uL (ref 3.87–5.11)
RDW: 13.2 % (ref 11.5–15.5)
WBC: 8.6 10*3/uL (ref 4.0–10.5)

## 2015-03-06 LAB — URINALYSIS, ROUTINE W REFLEX MICROSCOPIC
BILIRUBIN URINE: NEGATIVE
Glucose, UA: NEGATIVE mg/dL
KETONES UR: 15 mg/dL — AB
Leukocytes, UA: NEGATIVE
Nitrite: NEGATIVE
PH: 6.5 (ref 5.0–8.0)
Protein, ur: NEGATIVE mg/dL
Urobilinogen, UA: 0.2 mg/dL (ref 0.0–1.0)

## 2015-03-06 LAB — LIPASE, BLOOD: Lipase: 18 U/L — ABNORMAL LOW (ref 22–51)

## 2015-03-06 LAB — URINE MICROSCOPIC-ADD ON

## 2015-03-06 MED ORDER — SODIUM CHLORIDE 0.9 % IV BOLUS (SEPSIS)
2000.0000 mL | Freq: Once | INTRAVENOUS | Status: AC
  Administered 2015-03-06: 2000 mL via INTRAVENOUS

## 2015-03-06 MED ORDER — MORPHINE SULFATE (PF) 4 MG/ML IV SOLN
6.0000 mg | Freq: Once | INTRAVENOUS | Status: AC
  Administered 2015-03-06: 6 mg via INTRAVENOUS
  Filled 2015-03-06: qty 2

## 2015-03-06 MED ORDER — IOHEXOL 300 MG/ML  SOLN
100.0000 mL | Freq: Once | INTRAMUSCULAR | Status: AC | PRN
  Administered 2015-03-06: 100 mL via INTRAVENOUS

## 2015-03-06 MED ORDER — IOHEXOL 300 MG/ML  SOLN
25.0000 mL | Freq: Once | INTRAMUSCULAR | Status: AC | PRN
  Administered 2015-03-06: 25 mL via ORAL

## 2015-03-06 MED ORDER — LORAZEPAM 2 MG/ML IJ SOLN
1.0000 mg | Freq: Once | INTRAMUSCULAR | Status: AC
  Administered 2015-03-06: 1 mg via INTRAVENOUS
  Filled 2015-03-06: qty 1

## 2015-03-06 MED ORDER — POTASSIUM CHLORIDE ER 10 MEQ PO TBCR: 40.0000 meq | EXTENDED_RELEASE_TABLET | Freq: Every day | ORAL | Status: AC

## 2015-03-06 MED ORDER — ONDANSETRON HCL 4 MG/2ML IJ SOLN
4.0000 mg | Freq: Once | INTRAMUSCULAR | Status: AC
  Administered 2015-03-06: 4 mg via INTRAVENOUS
  Filled 2015-03-06: qty 2

## 2015-03-06 MED ORDER — ONDANSETRON HCL 4 MG PO TABS: 4.0000 mg | ORAL_TABLET | Freq: Three times a day (TID) | ORAL | Status: DC | PRN

## 2015-03-06 NOTE — ED Provider Notes (Signed)
CSN: [id number]     Arrival date & time 03/06/15  1151 History   First MD Initiated Contact with Patient 03/06/15 1308     Chief Complaint  Patient presents with  . Emesis     (Consider location/radiation/quality/duration/timing/severity/associated sxs/prior Treatment) Patient is a 44 y.o. female presenting with vomiting.  Emesis Severity:  Moderate Duration:  3 days Timing:  Constant Number of daily episodes:  Multiple Quality:  Undigested food and stomach contents Chronicity:  New Context: not post-tussive   Relieved by:  Nothing Worsened by:  Nothing tried Ineffective treatments:  None tried Associated symptoms: abdominal pain and diarrhea   Associated symptoms: no chills and no fever     Past Medical History  Diagnosis Date  . GERD (gastroesophageal reflux disease)   . Bacterial vaginosis   . Anxiety   . Depression   . Panic attacks   . Psoriasis   . IBS (irritable bowel syndrome)   . Pancreatitis     ETOH  . Chronic pain    Past Surgical History  Procedure Laterality Date  . Bladder surgery      X 3  . Eus N/A 10/11/2012    Procedure: UPPER ENDOSCOPIC ULTRASOUND (EUS) LINEAR;  Surgeon: Rachael Fee, MD;  Location: [facility];  Service: Endoscopy;  Laterality: N/A;  . Esophagogastroduodenoscopy endoscopy     Family History  Problem Relation Age of Onset  . Depression Mother     living  . Pancreatic cancer Father     living  . Colon cancer Neg Hx   . Colon polyps Neg Hx    Social History  Substance Use Topics  . Smoking status: Current Every Day Smoker -- 1.00 packs/day for 8 years    Types: Cigarettes  . Smokeless tobacco: Never Used     Comment: Smokes one pack of cigarettes daily  . Alcohol Use: No     Comment: former   OB History    No data available     Review of Systems  Constitutional: Positive for activity change and appetite change. Negative for fever and chills.  Respiratory: Negative for choking and shortness of breath.    Gastrointestinal: Positive for vomiting, abdominal pain and diarrhea.  All other systems reviewed and are negative.     Allergies  Doxycycline and Sulfa antibiotics  Home Medications   Prior to Admission medications   Medication Sig Start Date End Date Taking? Authorizing Provider  ALPRAZolam Prudy Feeler) 1 MG tablet Take 1 tablet by mouth 2 (two) times daily. 09/15/14   Historical Provider, MD  ciprofloxacin (CIPRO) 500 MG tablet Take 1 tablet (500 mg total) by mouth 2 (two) times daily. Patient not taking: Reported on 02/27/2015 12/24/14   Donnetta Hutching, MD  Cyanocobalamin (VITAMIN B-12) 1000 MCG SUBL Place 1 tablet under the tongue daily.    Historical Provider, MD  DIGESTIVE ENZYMES PO Take 1 tablet by mouth 3 (three) times daily before meals.    Historical Provider, MD  DULoxetine (CYMBALTA) 30 MG capsule Take 1 capsule (30 mg total) by mouth daily. Patient not taking: Reported on 02/27/2015 10/28/14   Eber Hong, MD  ferrous sulfate 325 (65 FE) MG tablet Take 325 mg by mouth daily.    Historical Provider, MD  ondansetron (ZOFRAN) 4 MG tablet Take 1 tablet (4 mg total) by mouth every 8 (eight) hours as needed for nausea or vomiting. 02/27/15   Courteney Lyn Mackuen, MD  promethazine (PHENERGAN) 25 MG tablet Take 1 tablet (25 mg total)  by mouth every 6 (six) hours as needed. Patient not taking: Reported on 02/27/2015 12/24/14   Donnetta Hutching, MD  traMADol (ULTRAM) 50 MG tablet Take 1 tablet (50 mg total) by mouth every 6 (six) hours as needed. Patient not taking: Reported on 02/27/2015 12/24/14   Donnetta Hutching, MD   BP 110/67 mmHg  Pulse 121  Temp(Src) 98.7 F (37.1 C) (Oral)  Resp 16  Ht  (1.549 m)  Wt 148 lb (67.132 kg)  BMI 27.98 kg/m2  SpO2 99%  LMP 03/13/2013 Physical Exam  Constitutional: She is oriented to person, place, and time. She appears well-developed and well-nourished.  HENT:  Head: Normocephalic and atraumatic.  Eyes: Conjunctivae and EOM are normal. Right eye exhibits no  discharge. Left eye exhibits no discharge.  Cardiovascular: Normal rate and regular rhythm.   Pulmonary/Chest: Effort normal and breath sounds normal. No respiratory distress.  Abdominal: Soft. She exhibits no distension. There is tenderness. There is no rebound.  Musculoskeletal: Normal range of motion. She exhibits no edema or tenderness.  Neurological: She is alert and oriented to person, place, and time.  Skin: Skin is warm and dry.  Nursing note and vitals reviewed.   ED Course  Procedures (including critical care time) Labs Review Labs Reviewed  COMPREHENSIVE METABOLIC PANEL - Abnormal; Notable for the following:    Potassium 3.2 (*)    Glucose, Bld 112 (*)    Calcium 8.6 (*)    All other components within normal limits  LIPASE, BLOOD - Abnormal; Notable for the following:    Lipase 18 (*)    All other components within normal limits  CBC WITH DIFFERENTIAL/PLATELET  URINALYSIS, ROUTINE W REFLEX MICROSCOPIC (NOT AT [facility])    Imaging Review Ct Abdomen Pelvis W Contrast  03/06/2015   CLINICAL DATA:  Subsequent encounter for continued vomiting in a patient with history pancreatitis.  EXAM: CT ABDOMEN AND PELVIS WITH CONTRAST  TECHNIQUE: Multidetector CT imaging of the abdomen and pelvis was performed using the standard protocol following bolus administration of intravenous contrast.  CONTRAST:  25mL OMNIPAQUE IOHEXOL 300 MG/ML SOLN, OMNIPAQUE IOHEXOL 300 MG/ML SOLN  COMPARISON:  10/09/2014.  09/10/2012  FINDINGS: Lower chest:  Unremarkable.  Hepatobiliary: No focal abnormality within the liver parenchyma. There is no evidence for gallstones, gallbladder wall thickening, or pericholecystic fluid. No intrahepatic or extrahepatic biliary dilation.  Pancreas: The pancreatic tail is markedly atrophic in this patient with no reported history of pancreatic surgery. This is unchanged from 10/09/2014. 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  There is mild dilatation of the main pancreatic duct in the body the pancreas, unchanged in the interval. No evidence for new or progressive pancreatic or peripancreatic edema/inflammation.  Spleen: No splenomegaly. No focal mass lesion. 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  Adrenals/Urinary Tract: No adrenal nodule or mass. Kidneys are unremarkable. No evidence for hydroureter. The urinary bladder appears normal for the degree of distention.  Stomach/Bowel: Stomach is nondistended. No gastric wall thickening. No evidence of outlet obstruction. Duodenum is normally positioned as is the ligament of Treitz. No small bowel wall thickening. No small bowel dilatation. The terminal ileum is normal. The appendix is normal. No gross colonic mass. No colonic wall thickening. No substantial diverticular change.  Vascular/Lymphatic: No abdominal aortic aneurysm. There is no gastrohepatic or hepatoduodenal ligament lymphadenopathy. No intraperitoneal or retroperitoneal lymphadenopy. No pelvic sidewall lymphadenopathy.  Reproductive: The uterus has normal CT imaging appearance. Dominant follicle  noted in the right ovary. Left ovary unremarkable.  Other: No intraperitoneal free fluid.  Musculoskeletal: Bone windows reveal no worrisome lytic or sclerotic osseous lesions.  IMPRESSION: 1. Stable exam.  No new or progressive interval findings. 2. 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 This is stable since the 10/09/2014 exam. No evidence for pancreatic or peripancreatic edema to suggest pancreatitis.   Electronically Signed   By: Kennith Center M.D.   On: 03/06/2015 15:41   I have personally reviewed and evaluated these images and lab results as part of my medical decision-making.   EKG Interpretation None      MDM   Final diagnoses:  Non-intractable vomiting without nausea, vomiting of unspecified type    Likely gastroenteritis v IBS flair but with some epigastric ttp. Will eval with labs, ct. Will give anti-emetics and one dose of pain medicine while waiting for labs. A couple liters of fluids 2/2 mild dehydration.   Heart rate improved with fluid and Ativan administration. Symptoms improved abdomen still hurting her but still no evidence of peritonitis. CT and labs negative for any acute pathology such as pancreatitis, diverticulitis, appendicitis, small bowel obstruction. Patient's Will for discharge. Will return here for any new or worsening symptoms.  I have personally and contemperaneously reviewed labs and imaging and used in my decision making as above.   A medical screening exam was performed and I feel the patient has had an appropriate workup for their chief complaint at this time and likelihood of emergent condition existing is low. They have been counseled on decision, discharge, follow up and which symptoms necessitate immediate return to the emergency department. They or their family verbally stated understanding and agreement with plan and discharged in stable condition.     Marily Memos, MD 03/06/15 [phone]

## 2015-03-06 NOTE — Discharge Instructions (Signed)
°Emergency Department Resource Guide °1) Find a Doctor and Pay Out of Pocket °Although you won't have to find out who is covered by your insurance plan, it is a good idea to ask around and get recommendations. You will then need to call the office and see if the doctor you have chosen will accept you as a new patient and what types of options they offer for patients who are self-pay. Some doctors offer discounts or will set up payment plans for their patients who do not have insurance, but you will need to ask so you aren't surprised when you get to your appointment. ° °2) Contact Your Local Health Department °Not all health departments have doctors that can see patients for sick visits, but many do, so it is worth a call to see if yours does. If you don't know where your local health department is, you can check in your phone book. The CDC also has a tool to help you locate your state's health department, and many state websites also have listings of all of their local health departments. ° °3) Find a Walk-in Clinic °If your illness is not likely to be very severe or complicated, you may want to try a walk in clinic. These are popping up all over the country in pharmacies, drugstores, and shopping centers. They're usually staffed by nurse practitioners or physician assistants that have been trained to treat common illnesses and complaints. They're usually fairly quick and inexpensive. However, if you have serious medical issues or chronic medical problems, these are probably not your best option. ° °No Primary Care Doctor: °- Call [facility] at  832-8000 - they can help you locate a primary care doctor that  accepts your insurance, provides certain services, etc. °- Physician Referral Service- 1-800-533-3463 ° °Chronic Pain Problems: °Organization         Address  Phone   Notes  °[facility]  (336) 297-2271 Patients need to be referred by their primary care doctor.  ° °Medication  Assistance: °Organization         Address  Phone   Notes  °[place] [facility] [address] °[place], [place] [address] (336) 641-8030 --Must be a resident of [place] °-- Must have NO insurance coverage whatsoever (no Medicaid/ Medicare, etc.) °-- The pt. MUST have a primary care doctor that directs their care regularly and follows them in the community °  °[facility]  (866) 331-1348   °[employer]  (888) 892-1162   ° °Agencies that provide inexpensive medical care: °Organization         Address  Phone   Notes  °[facility]  (336) 832-8035   °[facility]    (336) 832-7272   °[facility] [address] °[place], [place] [address] (336) 832-4777   °[facility] [address], °[place] (336) 271-4999   °[facility]    (336) 373-0678   °[facility]    (336) 272-1050   °[facility] ° [address], [place] Phone:  (336) 832-4444, Fax:  (336) 832-4440 Hours of Operation:  9 am - 6 pm, M-F.  Also accepts Medicaid/Medicare and self-pay.  °[facility] ° [address], [place] Phone: (336) 832-3150, Fax: (336) 832-3151. Hours of Operation:  8:30 am - 5:30 pm, M-F.  Also accepts Medicaid and self-pay.  °[facility] [place] [address], [place] Phone: (336) 878-6027   °[facility] [address], [place], [place] (336)723-1848, Ext. 123 Mondays & Thursdays: 7-9 AM.  First 15 patients are seen on a first come, first serve basis. °  ° °Medicaid-accepting [place] Providers: ° °Organization         Address  Phone   Notes  °[facility] [address], [place] (336) 641-2100 Also accepts self-pay patients.  °[facility] [address], [place] ° (336) 856-9996   °[facility] [address], [place]  (336) 288-8857   °[facility] [address] [place] [address], [place] (336) 299-7000   °Veita Bland [address], [place]  ° (336) 373-1557 Only accepts [place] Access Medicaid patients after they have their name applied to their card.  ° °Self-Pay (no insurance) in [place]: ° °Organization         Address  Phone   Notes  °Sickle Cell Patients, [facility] [address], [place] (336) 832-1970   °[facility] [address], [place] (336) 832-4400   °[facility] ° [address], [place] (336) 992-4800   °[facility]/Dr. Osei-Bonsu ° [address] [place] [address], [place] or [address], [place] (336) 841-8500 Phone number for both [place] and [place] locations is the same.  °[facility] [address], [place] (336) 299-0000   °[facility] [address] [place] [address], [place] or [address] (336) 852-7530 °(336) 878-2260   °[facility] [address], [place] (336) 350-1642, phone; (336) 294-5005, fax Sees patients 1st and 3rd Saturday of every month.  Must not qualify for public or private insurance (i.e. Medicaid, Medicare, [place] Health Choice, Veterans' Benefits) • Household income should be no more than 200% of the poverty level •The clinic cannot treat you if you are pregnant or think you are pregnant • Sexually transmitted diseases are not treated at the clinic.  ° ° °Dental Care: °Organization         Address  Phone  Notes  °[place] [facility] [address], [place] (336) 641-6152 Accepts children up to age 21 who are enrolled in Medicaid or [place] Health Choice; pregnant women with a Medicaid card; and children who have applied for Medicaid or [place] Health Choice, but were declined, whose parents can pay a reduced fee at time of service.  °[place]  [facility] [place]  [address], [place] (336) 641-7733 Accepts children up to age 21 who are enrolled in Medicaid or [place] Health Choice; pregnant women with a Medicaid card; and children who have applied for Medicaid or [place] Health Choice, but were declined, whose parents can pay a reduced fee at time of service.  °[facility] ° [address], [place] (336) 641-4533 Patients are seen by appointment only. Walk-ins are not accepted. [facility] will see patients 18 years of age and older. °Monday - Tuesday (8am-5pm) °Most Wednesdays (8:30-5pm) °$30 per visit, cash only  °[facility] ° [address], [place] (336) 641-4533 Patients are seen by appointment only. Walk-ins are not accepted. [facility] will see patients 18 years of age and older. °One   Wednesday Evening (Monthly: Volunteer Based).  $30 per visit, cash only  °[facility]  (919) 537-3737 for adults; Children under age 4, call Graduate Pediatric Dentistry at (919) [phone]. Children aged 4-14, please call (919) [phone] to request a pediatric application. ° Dental services are provided in all areas of dental care including fillings, crowns and bridges, complete and partial dentures, implants, gum treatment, root canals, and extractions. Preventive care is also provided. Treatment is provided to both adults and children. °Patients are selected via a lottery and there is often a waiting list. °  °[facility] [address], °[place] ° (336) 763-8833 [email or web address] °  °[facility] [address], [place], [place] (336)723-1848, Ext. 123 Second and Fourth Thursday of each month, opens at 6:30 AM; Clinic ends at 9 AM.  Patients are seen on a first-come first-served basis, and a limited number are seen during each clinic.  ° °[facility] ° [address], [place], [place] (336) [phone]    Eligibility Requirements °You must have lived in [place], [place], or [place] counties for at least the last three months. °  You cannot be eligible for state or federal sponsored healthcare insurance, including Veterans Administration, Medicaid, or Medicare. °  You generally cannot be eligible for healthcare insurance through your employer.  °  How to apply: °Eligibility screenings are held every Tuesday and Wednesday afternoon from 1:00 pm until 4:00 pm. You do not need an appointment for the interview!  °[facility] [address], [place], [place] 336-631-2330   °[place] [facility]  336-342-8273   °[place]  336-703-3100   °[facility]  336-570-6415   ° °Behavioral Health Resources in the Community: °Intensive Outpatient Programs °Organization         Address  Phone  Notes  °[facility] [address], [place], [place] 336-878-6098   °[facility] [address], [place], [place] 336-832-9800   °[facility] [address], [place], [place] ° 336-882-2125   °[facility] [address],  °[place], [place] 1-800-853-5163 or 336-641-4981   °Substance Abuse Resources °Organization         Address  Phone  Notes  °[facility]  336-882-2125   °[facility]  336-784-9470   °[facility]  336-285-9073   °[facility]  336-845-3988   °Residential & Outpatient Substance Abuse Program  1-800-659-3381   °Psychological Services °Organization         Address  Phone  Notes  °[facility]  336- 832-9600   °[facility]  336- 378-7881   °[facility] [address], [place] 1-800-853-5163 or 336-641-4981   ° °Mobile Crisis Teams °Organization         Address  Phone  Notes  °[facility], Mobile Crisis Care Unit  1-877-626-1772   °Assertive °[facility] ° [address]  [place], [place] 336-834-9664   °Sharon DeEsch [address] °[place] 336-554-5454   ° °Self-Help/Support Groups °Organization         Address  Phone             Notes  °[facility]  - variety of support groups  336- 373-1402 Call for more information  °Narcotics Anonymous (NA), [facility] [address], °[place]  2 meetings at this location  ° °  Residential Treatment Programs °Organization         Address  Phone  Notes  °[facility] [address],    °[place]  1-866-801-8205   °[facility] ° [address], [place], [place] 704-293-8524   °[facility] [address], [place] 336-845-3988 Admissions: 8am-3pm M-F  °[facility] [address]    °[place], [place] 336-841-1104   °[facility] [address], [place], [place] 336-379-7146   °[facility] [address]  °[place], [place] 336-285-9073   °[facility] Intensive Outpatient [address], [place], [place] 336-852-3033   °[facility] [address]  °[place], [place] 1-877-615-2722 or 336-784-9470   °[facility] [address] [place], [place] 336-227-7417 Accepts Medicaid  °[facility] [address]  °[place] 1-800-659-3381 Substance Abuse/Addiction Treatment  ° °[place] Behavioral Health Resources °Organization         Address  Phone  Notes  °[facility]  (888) 581-9988   °Julie Brannon, PhD [address] [place], [place]   (336) 349-5553 or (336) 951-0000   °[facility]   [address] °[place], [place] (336) 349-4454   °[facility] [address], [place], [place] (336) 342-8316 Insurance/Medicaid/sponsorship through [facility]  °[facility] [address]                                    [place], [place] (336) 342-8316 Therapy/tele-psych/case    °[facility] [address]  ° [place], [place] (336) 349-2233    °Dr. Arfeen  (336) 349-4544   °[facility] [place]  [employer] [place] [facility] 1) [address], [place] °2) [address], [place] °3)  [address], [place] (336) 349-3220 °(336) 342-7768 ° °(336) 342-8140   °[place] [facility] (336) 342-1394 or (336) 342-3537 (After Hours)    ° ° °

## 2015-03-06 NOTE — ED Notes (Signed)
Pt states continued vomiting since last visit. States pancreatitis flare up

## 2015-05-14 ENCOUNTER — Encounter (HOSPITAL_COMMUNITY): Payer: Self-pay | Admitting: Cardiology

## 2015-05-14 ENCOUNTER — Emergency Department (HOSPITAL_COMMUNITY): Payer: Self-pay

## 2015-05-14 ENCOUNTER — Emergency Department (HOSPITAL_COMMUNITY)
Admission: EM | Admit: 2015-05-14 | Discharge: 2015-05-14 | Disposition: A | Discharge status: Home / Self Care | Payer: Self-pay | Attending: Emergency Medicine | Admitting: Emergency Medicine

## 2015-05-14 DIAGNOSIS — R1030 Lower abdominal pain, unspecified: Secondary | ICD-10-CM | POA: Insufficient documentation

## 2015-05-14 DIAGNOSIS — F329 Major depressive disorder, single episode, unspecified: Secondary | ICD-10-CM | POA: Insufficient documentation

## 2015-05-14 DIAGNOSIS — R300 Dysuria: Secondary | ICD-10-CM | POA: Insufficient documentation

## 2015-05-14 DIAGNOSIS — R52 Pain, unspecified: Secondary | ICD-10-CM

## 2015-05-14 DIAGNOSIS — Z8742 Personal history of other diseases of the female genital tract: Secondary | ICD-10-CM | POA: Insufficient documentation

## 2015-05-14 DIAGNOSIS — Z79899 Other long term (current) drug therapy: Secondary | ICD-10-CM | POA: Insufficient documentation

## 2015-05-14 DIAGNOSIS — G8929 Other chronic pain: Secondary | ICD-10-CM | POA: Insufficient documentation

## 2015-05-14 DIAGNOSIS — Z872 Personal history of diseases of the skin and subcutaneous tissue: Secondary | ICD-10-CM | POA: Insufficient documentation

## 2015-05-14 DIAGNOSIS — Z3202 Encounter for pregnancy test, result negative: Secondary | ICD-10-CM | POA: Insufficient documentation

## 2015-05-14 DIAGNOSIS — R350 Frequency of micturition: Secondary | ICD-10-CM | POA: Insufficient documentation

## 2015-05-14 DIAGNOSIS — F410 Panic disorder [episodic paroxysmal anxiety] without agoraphobia: Secondary | ICD-10-CM | POA: Insufficient documentation

## 2015-05-14 DIAGNOSIS — Z8719 Personal history of other diseases of the digestive system: Secondary | ICD-10-CM | POA: Insufficient documentation

## 2015-05-14 DIAGNOSIS — R102 Pelvic and perineal pain: Secondary | ICD-10-CM | POA: Insufficient documentation

## 2015-05-14 LAB — URINALYSIS, ROUTINE W REFLEX MICROSCOPIC
Bilirubin Urine: NEGATIVE
GLUCOSE, UA: 100 mg/dL — AB
HGB URINE DIPSTICK: NEGATIVE
Ketones, ur: NEGATIVE mg/dL
LEUKOCYTES UA: NEGATIVE
Nitrite: POSITIVE — AB
pH: 7 (ref 5.0–8.0)

## 2015-05-14 LAB — CBC WITH DIFFERENTIAL/PLATELET
BASOS PCT: 1 %
Basophils Absolute: 0 10*3/uL (ref 0.0–0.1)
Eosinophils Absolute: 0.3 10*3/uL (ref 0.0–0.7)
Eosinophils Relative: 4 %
HEMATOCRIT: 38.7 % (ref 36.0–46.0)
HEMOGLOBIN: 13.2 g/dL (ref 12.0–15.0)
LYMPHS ABS: 1.7 10*3/uL (ref 0.7–4.0)
LYMPHS PCT: 26 %
MCH: 32.7 pg (ref 26.0–34.0)
MCHC: 34.1 g/dL (ref 30.0–36.0)
MCV: 95.8 fL (ref 78.0–100.0)
MONO ABS: 0.5 10*3/uL (ref 0.1–1.0)
MONOS PCT: 8 %
NEUTROS ABS: 4.1 10*3/uL (ref 1.7–7.7)
Neutrophils Relative %: 61 %
Platelets: 190 10*3/uL (ref 150–400)
RBC: 4.04 MIL/uL (ref 3.87–5.11)
RDW: 13.5 % (ref 11.5–15.5)
WBC: 6.7 10*3/uL (ref 4.0–10.5)

## 2015-05-14 LAB — COMPREHENSIVE METABOLIC PANEL
ALBUMIN: 4 g/dL (ref 3.5–5.0)
ALT: 34 U/L (ref 14–54)
ANION GAP: 12 (ref 5–15)
AST: 42 U/L — AB (ref 15–41)
Alkaline Phosphatase: 70 U/L (ref 38–126)
BILIRUBIN TOTAL: 0.6 mg/dL (ref 0.3–1.2)
BUN: 8 mg/dL (ref 6–20)
CHLORIDE: 104 mmol/L (ref 101–111)
CO2: 26 mmol/L (ref 22–32)
Calcium: 8.8 mg/dL — ABNORMAL LOW (ref 8.9–10.3)
Creatinine, Ser: 0.46 mg/dL (ref 0.44–1.00)
GFR calc Af Amer: >60 mL/min (ref >60)
GLUCOSE: 87 mg/dL (ref 65–99)
POTASSIUM: 3.4 mmol/L — AB (ref 3.5–5.1)
Sodium: 142 mmol/L (ref 135–145)
Total Protein: 6.9 g/dL (ref 6.5–8.1)

## 2015-05-14 LAB — LIPASE, BLOOD: Lipase: 19 U/L (ref 11–51)

## 2015-05-14 LAB — URINE MICROSCOPIC-ADD ON: RBC / HPF: NONE SEEN RBC/hpf (ref 0–5)

## 2015-05-14 LAB — PREGNANCY, URINE: PREG TEST UR: NEGATIVE

## 2015-05-14 MED ORDER — IBUPROFEN 800 MG PO TABS: 800.0000 mg | ORAL_TABLET | Freq: Three times a day (TID) | ORAL | Status: DC

## 2015-05-14 MED ORDER — CEPHALEXIN 500 MG PO CAPS
500.0000 mg | ORAL_CAPSULE | Freq: Four times a day (QID) | ORAL | Status: DC
Start: 2015-05-14 — End: 2016-02-12

## 2015-05-14 MED ORDER — HYDROCODONE-ACETAMINOPHEN 5-325 MG PO TABS: ORAL_TABLET | ORAL | Status: DC

## 2015-05-14 MED ORDER — MORPHINE SULFATE (PF) 4 MG/ML IV SOLN
4.0000 mg | Freq: Once | INTRAVENOUS | Status: AC
  Administered 2015-05-14: 4 mg via INTRAVENOUS
  Filled 2015-05-14: qty 1

## 2015-05-14 MED ORDER — ONDANSETRON HCL 4 MG/2ML IJ SOLN
4.0000 mg | Freq: Once | INTRAMUSCULAR | Status: AC
  Administered 2015-05-14: 4 mg via INTRAVENOUS
  Filled 2015-05-14: qty 2

## 2015-05-14 NOTE — ED Notes (Signed)
Pt states understanding of care given and follow up instructions.  Ambulated from ED with significant other 

## 2015-05-14 NOTE — ED Notes (Addendum)
Lower back pain 6 days ago.   Lower abdominal pain.

## 2015-05-14 NOTE — ED Provider Notes (Signed)
CSN: [id number]     Arrival date & time 05/14/15  1406 History   First MD Initiated Contact with Patient 05/14/15 1610     No chief complaint on file.    (Consider location/radiation/quality/duration/timing/severity/associated sxs/prior Treatment) HPI   Monique Nguyen is a 44 y.o. female who presents to the Emergency Department complaining of lower abdominal pain and low back pain for for one week.  She states that she has also been experiencing urinary burning and frequency and felt she had a UTI.  She has been taking OTC AZO without relief.  She describes pain to her lower pelvic area that is cramping and constant.  Nothing makes her symptoms better or worse.  She states that she no longer has menstrual periods, but is unsure why.  She denies vaginal bleeding, discharge, fever, chills, vomiting or diarrhea.     Past Medical History  Diagnosis Date  . GERD (gastroesophageal reflux disease)   . Bacterial vaginosis   . Anxiety   . Depression   . Panic attacks   . Psoriasis   . IBS (irritable bowel syndrome)   . Pancreatitis     ETOH  . Chronic pain    Past Surgical History  Procedure Laterality Date  . Bladder surgery      X 3  . Eus N/A 10/11/2012    Procedure: UPPER ENDOSCOPIC ULTRASOUND (EUS) LINEAR;  Surgeon: Rachael Feeaniel P Jacobs, MD;  Location: [facility];  Service: Endoscopy;  Laterality: N/A;  . Esophagogastroduodenoscopy endoscopy     Family History  Problem Relation Age of Onset  . Depression Mother     living  . Pancreatic cancer Father     living  . Colon cancer Neg Hx   . Colon polyps Neg Hx    Social History  Substance Use Topics  . Smoking status: Current Every Day Smoker -- 1.00 packs/day for 8 years    Types: Cigarettes  . Smokeless tobacco: Never Used     Comment: Smokes one pack of cigarettes daily  . Alcohol Use: No     Comment: former   OB History    No data available     Review of Systems  Constitutional: Negative for fever, chills and  appetite change.  Respiratory: Negative for shortness of breath.   Cardiovascular: Negative for chest pain.  Gastrointestinal: Negative for nausea, vomiting, abdominal pain and blood in stool.  Genitourinary: Positive for dysuria, frequency and pelvic pain. Negative for flank pain, decreased urine volume, vaginal discharge, difficulty urinating and menstrual problem.  Musculoskeletal: Negative for back pain.  Skin: Negative for color change and rash.  Neurological: Negative for dizziness, weakness and numbness.  Hematological: Negative for adenopathy.  All other systems reviewed and are negative.     Allergies  Doxycycline and Sulfa antibiotics  Home Medications   Prior to Admission medications   Medication Sig Start Date End Date Taking? Authorizing Provider  ALPRAZolam Prudy Feeler(XANAX) 1 MG tablet Take 1 tablet by mouth 2 (two) times daily. 09/15/14   Historical Provider, MD  Cyanocobalamin (VITAMIN B-12) 1000 MCG SUBL Place 1 tablet under the tongue daily.    Historical Provider, MD  DIGESTIVE ENZYMES PO Take 1 tablet by mouth 3 (three) times daily before meals.    Historical Provider, MD  ferrous sulfate 325 (65 FE) MG tablet Take 325 mg by mouth daily.    Historical Provider, MD  ondansetron (ZOFRAN) 4 MG tablet Take 1 tablet (4 mg total) by mouth every 8 (eight) hours as  needed for nausea or vomiting. 03/06/15   Marily Memos, MD   BP 111/79 mmHg  Pulse 90  Temp(Src) 97.6 F (36.4 C) (Oral)  Resp 18  Ht  (1.549 m)  Wt 61.689 kg  BMI 25.71 kg/m2  SpO2 95%  LMP 03/13/2013 Physical Exam  Constitutional: She is oriented to person, place, and time. She appears well-developed and well-nourished. No distress.  HENT:  Head: Normocephalic and atraumatic.  Cardiovascular: Normal rate, regular rhythm and intact distal pulses.   No murmur heard. Pulmonary/Chest: Effort normal and breath sounds normal. No respiratory distress. She has no rales.  Abdominal: Soft. Normal appearance. She  exhibits no distension and no mass. There is no hepatosplenomegaly. There is tenderness in the suprapubic area. There is no rigidity, no rebound, no guarding, no CVA tenderness and no tenderness at McBurney's point.  Mild ttp of the suprapubic region.  Remaining abdomen is soft, non-tender without guarding or rebound tenderness. No CVA tenderness  Genitourinary: There is no lesion on the right labia. There is no lesion on the left labia. Cervix exhibits no motion tenderness and no discharge. Right adnexum displays no mass and no tenderness. Left adnexum displays no mass and no tenderness. No bleeding in the vagina. No foreign body around the vagina. No vaginal discharge found.  NO CMT tenderness, no adnexal masses or tenderness.    Musculoskeletal: Normal range of motion. She exhibits no edema.  Neurological: She is alert and oriented to person, place, and time. Coordination normal.  Skin: Skin is warm and dry. No rash noted.  Nursing note and vitals reviewed.   ED Course  Procedures (including critical care time) Labs Review Labs Reviewed  URINALYSIS, ROUTINE W REFLEX MICROSCOPIC (NOT AT [facility]) - Abnormal; Notable for the following:    Color, Urine ORANGE (*)    Specific Gravity, Urine <1.005 (*)    Glucose, UA 100 (*)    Protein, ur TRACE (*)    Nitrite POSITIVE (*)    All other components within normal limits  URINE MICROSCOPIC-ADD ON - Abnormal; Notable for the following:    Squamous Epithelial / LPF 0-5 (*)    Bacteria, UA FEW (*)    All other components within normal limits  COMPREHENSIVE METABOLIC PANEL - Abnormal; Notable for the following:    Potassium 3.4 (*)    Calcium 8.8 (*)    AST 42 (*)    All other components within normal limits  URINE CULTURE  PREGNANCY, URINE  CBC WITH DIFFERENTIAL/PLATELET  LIPASE, BLOOD  RPR  GC/CHLAMYDIA PROBE AMP ([place]) NOT AT [facility]    Imaging Review US Transvaginal Non-ob  05/14/2015  CLINICAL DATA:  Generalized abdominal pain  EXAM: TRANSABDOMINAL AND TRANSVAGINAL ULTRASOUND OF PELVIS TECHNIQUE: Both transabdominal and transvaginal ultrasound examinations of the pelvis were performed. Transabdominal technique was performed for global imaging of the pelvis including uterus, ovaries, adnexal regions, and pelvic cul-de-sac. It was necessary to proceed with endovaginal exam following the transabdominal exam to visualize the the ovaries. COMPARISON:  CT scan 03/06/2015 FINDINGS: Uterus Measurements: 7.3 x 3.9 x 4.3 cm. No fibroids or other mass visualized. Endometrium Thickness: 4 mm thickness within normal limits. No focal abnormality visualized. Right ovary Measurements: 2.6 x 2.4 x 2.6 cm. Measures 5.7 cc in volume. No adnexal mass. There is a right ovarian simple cyst measures 1.6 x 1.5 cm. Left ovary Measurements: 1.8 x 2.3 x 1.6 cm. Measures 3.6 cc volume. Normal appearance/no adnexal mass. Other findings Trace free fluid noted posterior  cul-de-sac. IMPRESSION: 1. Normal size uterus. No fibroids are noted. Unremarkable endometrial stripe. 2. No adnexal mass. There is a cyst/follicle within right ovary measures 1.6 cm. Unremarkable left ovary. Trace pelvic free fluid. Electronically Signed   By: Natasha Mead M.D.   On: 05/14/2015 18:21   US Pelvis Complete  05/14/2015  CLINICAL DATA:  Generalized abdominal pain EXAM: TRANSABDOMINAL AND TRANSVAGINAL ULTRASOUND OF PELVIS TECHNIQUE: Both transabdominal and transvaginal ultrasound examinations of the pelvis were performed. Transabdominal technique was performed for global imaging of the pelvis including uterus, ovaries, adnexal regions, and pelvic cul-de-sac. It was necessary to proceed with endovaginal exam following the transabdominal exam to visualize the the ovaries. COMPARISON:  CT scan 03/06/2015 FINDINGS: Uterus Measurements: 7.3 x 3.9 x 4.3 cm. No fibroids or other mass visualized. Endometrium Thickness: 4 mm thickness within normal limits. No focal abnormality visualized. Right  ovary Measurements: 2.6 x 2.4 x 2.6 cm. Measures 5.7 cc in volume. No adnexal mass. There is a right ovarian simple cyst measures 1.6 x 1.5 cm. Left ovary Measurements: 1.8 x 2.3 x 1.6 cm. Measures 3.6 cc volume. Normal appearance/no adnexal mass. Other findings Trace free fluid noted posterior cul-de-sac. IMPRESSION: 1. Normal size uterus. No fibroids are noted. Unremarkable endometrial stripe. 2. No adnexal mass. There is a cyst/follicle within right ovary measures 1.6 cm. Unremarkable left ovary. Trace pelvic free fluid. Electronically Signed   By: Natasha Mead M.D.   On: 05/14/2015 18:21   I have personally reviewed and evaluated these images and lab results as part of my medical decision-making.  Urine GC and Chalymdia cultures, HIV and RPR results pending  MDM   Final diagnoses:  Dysuria  Pelvic pain in female    Pt well appearing, ambulated to restroom with steady gait.  Non-toxic appearing.  Agree to arrange PMD with [facility].  rx for keflex and #10 vicodin    Pauline Aus, PA-C 05/16/15 1322  Vanetta Mulders, MD 05/20/15 1555

## 2015-05-15 LAB — URINE CULTURE

## 2015-05-15 LAB — GC/CHLAMYDIA PROBE AMP (~~LOC~~) NOT AT ARMC
CHLAMYDIA, DNA PROBE: NEGATIVE
NEISSERIA GONORRHEA: NEGATIVE

## 2015-05-15 LAB — RPR: RPR Ser Ql: NONREACTIVE

## 2015-12-12 HISTORY — PX: EUS: SHX5427

## 2016-02-12 ENCOUNTER — Encounter (HOSPITAL_COMMUNITY): Payer: Self-pay | Admitting: Emergency Medicine

## 2016-02-12 ENCOUNTER — Emergency Department (HOSPITAL_COMMUNITY)
Admission: EM | Admit: 2016-02-12 | Discharge: 2016-02-12 | Disposition: A | Discharge status: Home / Self Care | Payer: Self-pay | Attending: Emergency Medicine | Admitting: Emergency Medicine

## 2016-02-12 ENCOUNTER — Emergency Department (HOSPITAL_COMMUNITY): Payer: Self-pay

## 2016-02-12 DIAGNOSIS — F17210 Nicotine dependence, cigarettes, uncomplicated: Secondary | ICD-10-CM | POA: Insufficient documentation

## 2016-02-12 DIAGNOSIS — R109 Unspecified abdominal pain: Secondary | ICD-10-CM

## 2016-02-12 DIAGNOSIS — Z79899 Other long term (current) drug therapy: Secondary | ICD-10-CM | POA: Insufficient documentation

## 2016-02-12 DIAGNOSIS — R1084 Generalized abdominal pain: Secondary | ICD-10-CM | POA: Insufficient documentation

## 2016-02-12 LAB — URINALYSIS, ROUTINE W REFLEX MICROSCOPIC
BILIRUBIN URINE: NEGATIVE
GLUCOSE, UA: NEGATIVE mg/dL
HGB URINE DIPSTICK: NEGATIVE
KETONES UR: NEGATIVE mg/dL
Leukocytes, UA: NEGATIVE
NITRITE: NEGATIVE
PH: 6 (ref 5.0–8.0)
Protein, ur: NEGATIVE mg/dL
SPECIFIC GRAVITY, URINE: 1.015 (ref 1.005–1.030)

## 2016-02-12 LAB — CBC WITH DIFFERENTIAL/PLATELET
BASOS ABS: 0 10*3/uL (ref 0.0–0.1)
Basophils Relative: 1 %
EOS ABS: 0.3 10*3/uL (ref 0.0–0.7)
Eosinophils Relative: 4 %
HCT: 40.7 % (ref 36.0–46.0)
HEMOGLOBIN: 14.1 g/dL (ref 12.0–15.0)
LYMPHS ABS: 1.5 10*3/uL (ref 0.7–4.0)
LYMPHS PCT: 18 %
MCH: 32.6 pg (ref 26.0–34.0)
MCHC: 34.6 g/dL (ref 30.0–36.0)
MCV: 94.2 fL (ref 78.0–100.0)
Monocytes Absolute: 0.8 10*3/uL (ref 0.1–1.0)
Monocytes Relative: 10 %
NEUTROS PCT: 69 %
Neutro Abs: 5.9 10*3/uL (ref 1.7–7.7)
Platelets: 272 10*3/uL (ref 150–400)
RBC: 4.32 MIL/uL (ref 3.87–5.11)
RDW: 12.7 % (ref 11.5–15.5)
WBC: 8.6 10*3/uL (ref 4.0–10.5)

## 2016-02-12 LAB — PREGNANCY, URINE: Preg Test, Ur: NEGATIVE

## 2016-02-12 LAB — RAPID URINE DRUG SCREEN, HOSP PERFORMED
Amphetamines: POSITIVE — AB
BARBITURATES: NOT DETECTED
BENZODIAZEPINES: POSITIVE — AB
COCAINE: NOT DETECTED
OPIATES: NOT DETECTED
TETRAHYDROCANNABINOL: POSITIVE — AB

## 2016-02-12 LAB — COMPREHENSIVE METABOLIC PANEL
ALK PHOS: 70 U/L (ref 38–126)
ALT: 32 U/L (ref 14–54)
AST: 36 U/L (ref 15–41)
Albumin: 4.6 g/dL (ref 3.5–5.0)
Anion gap: 6 (ref 5–15)
BUN: 16 mg/dL (ref 6–20)
CALCIUM: 8.5 mg/dL — AB (ref 8.9–10.3)
CO2: 22 mmol/L (ref 22–32)
CREATININE: 0.72 mg/dL (ref 0.44–1.00)
Chloride: 105 mmol/L (ref 101–111)
GFR calc non Af Amer: >60 mL/min (ref >60)
GLUCOSE: 113 mg/dL — AB (ref 65–99)
Potassium: 3.2 mmol/L — ABNORMAL LOW (ref 3.5–5.1)
SODIUM: 133 mmol/L — AB (ref 135–145)
Total Bilirubin: 0.4 mg/dL (ref 0.3–1.2)
Total Protein: 7.7 g/dL (ref 6.5–8.1)

## 2016-02-12 LAB — LIPASE, BLOOD: Lipase: 30 U/L (ref 11–51)

## 2016-02-12 MED ORDER — KETOROLAC TROMETHAMINE 30 MG/ML IJ SOLN
30.0000 mg | Freq: Once | INTRAMUSCULAR | Status: AC
  Administered 2016-02-12: 30 mg via INTRAVENOUS
  Filled 2016-02-12: qty 1

## 2016-02-12 MED ORDER — METOCLOPRAMIDE HCL 5 MG/ML IJ SOLN
10.0000 mg | Freq: Once | INTRAMUSCULAR | Status: AC
  Administered 2016-02-12: 10 mg via INTRAVENOUS
  Filled 2016-02-12: qty 2

## 2016-02-12 MED ORDER — IOPAMIDOL (ISOVUE-300) INJECTION 61%: 15.0000 mL | Freq: Once | INTRAVENOUS | Status: DC | PRN

## 2016-02-12 MED ORDER — PROMETHAZINE HCL 25 MG RE SUPP
25.0000 mg | Freq: Four times a day (QID) | RECTAL | 0 refills | Status: DC | PRN
Start: 2016-02-12 — End: 2017-02-10

## 2016-02-12 MED ORDER — MORPHINE SULFATE (PF) 4 MG/ML IV SOLN
4.0000 mg | Freq: Once | INTRAVENOUS | Status: AC
  Administered 2016-02-12: 4 mg via INTRAVENOUS
  Filled 2016-02-12: qty 1

## 2016-02-12 MED ORDER — IOPAMIDOL (ISOVUE-300) INJECTION 61%
100.0000 mL | Freq: Once | INTRAVENOUS | Status: AC | PRN
  Administered 2016-02-12: 100 mL via INTRAVENOUS

## 2016-02-12 MED ORDER — ONDANSETRON HCL 4 MG/2ML IJ SOLN
4.0000 mg | Freq: Once | INTRAMUSCULAR | Status: AC
  Administered 2016-02-12: 4 mg via INTRAVENOUS
  Filled 2016-02-12: qty 2

## 2016-02-12 MED ORDER — SODIUM CHLORIDE 0.9 % IV BOLUS (SEPSIS)
2000.0000 mL | Freq: Once | INTRAVENOUS | Status: AC
  Administered 2016-02-12: 2000 mL via INTRAVENOUS

## 2016-02-12 NOTE — ED Provider Notes (Signed)
[facility]Y DEPT Provider Note   CSN: [id number] Arrival date & time: 02/12/16  1620     History   Chief Complaint Chief Complaint  Patient presents with  . Abdominal Pain    HPI Monique Nguyen is a 45 y.o. female.  HPI Patient with long-standing history of recurrent abdominal pain.  She follows with the GI team at [facility].  She recently underwent upper endoscopy.  She presents with generalized lower abdominal pain without dysuria or urinary frequency.  She reports nausea without vomiting.  Denies diarrhea.  Symptoms are moderate in severity.  She also reports new intermittent vaginal bleeding which is atypical for her as she stopped having menstrual cycles approximately 2 years ago.  She has no active bleeding at this time.  No vaginal discharge or pain with intercourse    Past Medical History:  Diagnosis Date  . Anxiety   . Bacterial vaginosis   . Chronic pain   . Depression   . GERD (gastroesophageal reflux disease)   . IBS (irritable bowel syndrome)   . Pancreatitis    ETOH  . Panic attacks   . Psoriasis     Patient Active Problem List   Diagnosis Date Noted  . 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 Facial cellulitis 09/10/2012  . Insect bite 09/10/2012  . Depression 09/10/2012  . Anxiety 09/10/2012  . Hypokalemia 09/10/2012  . Tobacco abuse 09/10/2012  . Alcohol dependence (HCC) 06/28/2012  . Benzodiazepine abuse, continuous 06/28/2012  . Suicide threat or attempt 06/28/2012    Class: Acute  . 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 Diarrhea 11/11/2010  . Nausea 11/11/2010  . Vaginal discharge 11/11/2010    Past Surgical History:  Procedure Laterality Date  . BLADDER SURGERY     X 3  . ESOPHAGOGASTRODUODENOSCOPY ENDOSCOPY    . EUS N/A 10/11/2012   Procedure: UPPER ENDOSCOPIC ULTRASOUND (EUS) LINEAR;  Surgeon: Rachael Fee, MD;  Location: [facility];  Service: Endoscopy;  Laterality: N/A;     OB History    No data available       Home Medications    Prior to Admission medications   Medication Sig Start Date End Date Taking? Authorizing Provider  ALPRAZolam Prudy Feeler) 1 MG tablet Take 1 tablet by mouth 2 (two) times daily. 09/15/14  Yes Historical Provider, MD  cholecalciferol (VITAMIN D) 1000 units tablet Take 1,000 Units by mouth daily.   Yes Historical Provider, MD  clobetasol (TEMOVATE) 0.05 % external solution apply to affected area twice a day 01/15/16  Yes Historical Provider, MD  Cyanocobalamin (VITAMIN B-12) 1000 MCG SUBL Place 1 tablet under the tongue daily.   Yes Historical Provider, MD  estradiol (ESTRACE) 0.5 MG tablet Take 0.5 mg by mouth daily.  01/18/16  Yes Historical Provider, MD  ferrous sulfate 325 (65 FE) MG tablet Take 325 mg by mouth daily.   Yes Historical Provider, MD  ondansetron (ZOFRAN) 4 MG tablet Take 1 tablet (4 mg total) by mouth every 8 (eight) hours as needed for nausea or vomiting. 03/06/15  Yes Marily Memos, MD  promethazine (PHENERGAN) 25 MG tablet Take 25 mg by mouth every 6 (six) hours as needed for nausea or vomiting.  01/18/16  Yes Historical Provider, MD  vitamin C (ASCORBIC ACID) 500 MG tablet Take 500 mg by mouth daily.   Yes Historical Provider, MD    Family History Family History  Problem Relation Age of Onset  . Depression Mother  living  . Pancreatic cancer Father     living  . Colon cancer Neg Hx   . Colon polyps Neg Hx     Social History Social History  Substance Use Topics  . Smoking status: Current Every Day Smoker    Packs/day: 1.00    Years: 8.00    Types: Cigarettes  . Smokeless tobacco: Never Used     Comment: Smokes one pack of cigarettes daily  . Alcohol use No     Comment: former     Allergies   Doxycycline and Sulfa antibiotics   Review of Systems Review of Systems  All other systems reviewed and are negative.    Physical Exam Updated Vital Signs BP 102/77 (BP Location: Right Arm)   Pulse  96   Temp 98.2 F (36.8 C) (Oral)   Resp 18   Ht 5\' 1"  (1.549 m)   Wt 128 lb (58.1 kg)   LMP 03/13/2013 Comment: neg u pre  SpO2 99%   BMI 24.19 kg/m   Physical Exam  Constitutional: She is oriented to person, place, and time. She appears well-developed and well-nourished. No distress.  HENT:  Head: Normocephalic and atraumatic.  Eyes: EOM are normal.  Neck: Normal range of motion.  Cardiovascular: Normal rate, regular rhythm and normal heart sounds.   Pulmonary/Chest: Effort normal and breath sounds normal.  Abdominal: Soft. She exhibits no distension.  Mild generalized abdominal tenderness without guarding or rebound.  Musculoskeletal: Normal range of motion.  Neurological: She is alert and oriented to person, place, and time.  Skin: Skin is warm and dry.  Psychiatric: She has a normal mood and affect. Judgment normal.  Nursing note and vitals reviewed.    ED Treatments / Results  Labs (all labs ordered are listed, but only abnormal results are displayed) Labs Reviewed  COMPREHENSIVE METABOLIC PANEL - Abnormal; Notable for the following:       Result Value   Sodium 133 (*)    Potassium 3.2 (*)    Glucose, Bld 113 (*)    Calcium 8.5 (*)    All other components within normal limits  URINE RAPID DRUG SCREEN, HOSP PERFORMED - Abnormal; Notable for the following:    Benzodiazepines POSITIVE (*)    Amphetamines POSITIVE (*)    Tetrahydrocannabinol POSITIVE (*)    All other components within normal limits  CBC WITH DIFFERENTIAL/PLATELET  URINALYSIS, ROUTINE W REFLEX MICROSCOPIC (NOT AT [facility])  LIPASE, BLOOD  PREGNANCY, URINE    EKG  EKG Interpretation None       Radiology Ct Abdomen Pelvis W Contrast  Result Date: 02/12/2016 CLINICAL DATA:  45 y/o F; bladder or uterus pain for 5 months. Pancreas pain for 3 years. Urinary hesitancy with a border. EXAM: CT ABDOMEN AND PELVIS WITH CONTRAST TECHNIQUE: Multidetector CT imaging of the abdomen and pelvis was performed  using the standard protocol following bolus administration of intravenous contrast. CONTRAST:  ISOVUE-300 IOPAMIDOL (ISOVUE-300) INJECTION 61% COMPARISON:  05/14/2015 pelvis ultrasound. 03/06/2015 and 09/10/2012 CT of abdomen and pelvis. 03/28/2013 abdomen MRI. FINDINGS: Lower chest:  No acute findings. Hepatobiliary: No masses or other significant abnormality. Pancreas: Unchanged severe atrophy of the mid pancreatic body and tail with dilatation of the pancreatic duct at the junction between normal pancreas and atrophic pancreas. There is faint stranding of mesenteric fat and prominent lymph nodes inferior to the residual pancreatic body. Spleen: Within normal limits in size and appearance. 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 Adrenals/Urinary Tract: No mass identified. Mild prominence of collecting systems bilaterally without an obstructing lesion. Moderate bladder distention. Slight urothelial enhancement of the ureters best appreciated in the right lower ureter. Stomach/Bowel: No evidence of obstruction, inflammatory process, or abnormal fluid collections. Normal appendix. Vascular/Lymphatic: No pathologically enlarged lymph nodes. No evidence of abdominal aortic aneurysm. Reproductive: No mass or other significant abnormality. Other: None. Musculoskeletal:  No suspicious bone lesions identified. IMPRESSION: 1. Mild prominence of the bilateral renal collecting systems and slight urothelial enhancement may represent a infection of the collecting systems. No mass or obstructing stone is identified. 2. Faint mesenteric fat stranding and prominent lymph nodes inferior to the residual pancreatic body may represent reactive inflammatory changes, possibly related to the pancreatitis. Correlation with lipase is recommended. 3. Stable atrophy pancreatic body and tail. 4. 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  Electronically Signed   By: Mitzi HansenLance  Furusawa-Stratton M.D.   On: 02/12/2016 19:58    Procedures Procedures (including critical care time)  Medications Ordered in ED Medications  iopamidol (ISOVUE-300) 61 % injection 15 mL (not administered)  sodium chloride 0.9 % bolus 2,000 mL (2,000 mLs Intravenous New Bag/Given 02/12/16 1736)  metoCLOPramide (REGLAN) injection 10 mg (10 mg Intravenous Given 02/12/16 1736)  ondansetron (ZOFRAN) injection 4 mg (4 mg Intravenous Given 02/12/16 1736)  morphine 4 MG/ML injection 4 mg (4 mg Intravenous Given 02/12/16 1736)  ketorolac (TORADOL) 30 MG/ML injection 30 mg (30 mg Intravenous Given 02/12/16 1736)  iopamidol (ISOVUE-300) 61 % injection 100 mL (100 mLs Intravenous Contrast Given 02/12/16 1924)     Initial Impression / Assessment and Plan / ED Course  I have reviewed the triage vital signs and the nursing notes.  Pertinent labs & imaging results that were available during my care of the patient were reviewed by me and considered in my medical decision making (see chart for details).  Clinical Course    Overall the patient is well-appearing.  CT scan labs and urine are reassuring.  Discharge home in good condition.  [facility]  No indication for additional workup.  Final Clinical Impressions(s) / ED Diagnoses   Final diagnoses:  Abdominal pain, unspecified abdominal location    New Prescriptions Discharge Medication List as of 02/12/2016  8:54 PM    START taking these medications   Details  promethazine (PHENERGAN) 25 MG suppository Place 1 suppository (25 mg total) rectally every 6 (six) hours as needed for nausea or vomiting., Starting Fri 02/12/2016, Print         Azalia BilisKevin Alvin Diffee, MD 02/12/16 2243

## 2016-02-12 NOTE — ED Triage Notes (Signed)
"  bladder or uterus" pain x 5 months. Also pancreas pain over 3 years. Currently being tested for cancer. Pt very anxious in traige. Takes a while to urinate with odor.

## 2017-02-10 ENCOUNTER — Encounter (HOSPITAL_COMMUNITY): Payer: Self-pay | Admitting: Emergency Medicine

## 2017-02-10 ENCOUNTER — Emergency Department (HOSPITAL_COMMUNITY)
Admission: EM | Admit: 2017-02-10 | Discharge: 2017-02-10 | Disposition: A | Discharge status: Home / Self Care | Payer: Self-pay | Attending: Emergency Medicine | Admitting: Emergency Medicine

## 2017-02-10 ENCOUNTER — Emergency Department (HOSPITAL_COMMUNITY): Payer: Self-pay

## 2017-02-10 DIAGNOSIS — Z79899 Other long term (current) drug therapy: Secondary | ICD-10-CM | POA: Insufficient documentation

## 2017-02-10 DIAGNOSIS — F17210 Nicotine dependence, cigarettes, uncomplicated: Secondary | ICD-10-CM | POA: Insufficient documentation

## 2017-02-10 DIAGNOSIS — R52 Pain, unspecified: Secondary | ICD-10-CM

## 2017-02-10 DIAGNOSIS — E876 Hypokalemia: Secondary | ICD-10-CM

## 2017-02-10 DIAGNOSIS — R102 Pelvic and perineal pain: Secondary | ICD-10-CM | POA: Insufficient documentation

## 2017-02-10 LAB — CBC WITH DIFFERENTIAL/PLATELET
Basophils Absolute: 0 10*3/uL (ref 0.0–0.1)
Basophils Relative: 0 %
EOS ABS: 0.5 10*3/uL (ref 0.0–0.7)
Eosinophils Relative: 7 %
HCT: 39 % (ref 36.0–46.0)
HEMOGLOBIN: 13.8 g/dL (ref 12.0–15.0)
LYMPHS ABS: 2 10*3/uL (ref 0.7–4.0)
Lymphocytes Relative: 30 %
MCH: 32.5 pg (ref 26.0–34.0)
MCHC: 35.4 g/dL (ref 30.0–36.0)
MCV: 92 fL (ref 78.0–100.0)
MONOS PCT: 7 %
Monocytes Absolute: 0.5 10*3/uL (ref 0.1–1.0)
NEUTROS PCT: 56 %
Neutro Abs: 3.7 10*3/uL (ref 1.7–7.7)
Platelets: 302 10*3/uL (ref 150–400)
RBC: 4.24 MIL/uL (ref 3.87–5.11)
RDW: 13 % (ref 11.5–15.5)
WBC: 6.6 10*3/uL (ref 4.0–10.5)

## 2017-02-10 LAB — COMPREHENSIVE METABOLIC PANEL
ALK PHOS: 65 U/L (ref 38–126)
ALT: 12 U/L — AB (ref 14–54)
ANION GAP: 8 (ref 5–15)
AST: 16 U/L (ref 15–41)
Albumin: 4.2 g/dL (ref 3.5–5.0)
BILIRUBIN TOTAL: 0.6 mg/dL (ref 0.3–1.2)
BUN: 5 mg/dL — ABNORMAL LOW (ref 6–20)
CALCIUM: 9.1 mg/dL (ref 8.9–10.3)
CO2: 28 mmol/L (ref 22–32)
CREATININE: 0.64 mg/dL (ref 0.44–1.00)
Chloride: 103 mmol/L (ref 101–111)
Glucose, Bld: 105 mg/dL — ABNORMAL HIGH (ref 65–99)
Potassium: 2.4 mmol/L — CL (ref 3.5–5.1)
SODIUM: 139 mmol/L (ref 135–145)
TOTAL PROTEIN: 7 g/dL (ref 6.5–8.1)

## 2017-02-10 LAB — URINALYSIS, ROUTINE W REFLEX MICROSCOPIC
BILIRUBIN URINE: NEGATIVE
Glucose, UA: NEGATIVE mg/dL
Hgb urine dipstick: NEGATIVE
Ketones, ur: NEGATIVE mg/dL
Leukocytes, UA: NEGATIVE
NITRITE: NEGATIVE
Protein, ur: NEGATIVE mg/dL
SPECIFIC GRAVITY, URINE: 1.005 (ref 1.005–1.030)
pH: 6 (ref 5.0–8.0)

## 2017-02-10 LAB — RAPID URINE DRUG SCREEN, HOSP PERFORMED
AMPHETAMINES: NOT DETECTED
BARBITURATES: NOT DETECTED
Benzodiazepines: POSITIVE — AB
Cocaine: NOT DETECTED
Opiates: NOT DETECTED
TETRAHYDROCANNABINOL: POSITIVE — AB

## 2017-02-10 LAB — ETHANOL

## 2017-02-10 LAB — LIPASE, BLOOD: LIPASE: 21 U/L (ref 11–51)

## 2017-02-10 MED ORDER — POTASSIUM CHLORIDE CRYS ER 20 MEQ PO TBCR
40.0000 meq | EXTENDED_RELEASE_TABLET | Freq: Once | ORAL | Status: AC
  Administered 2017-02-10: 40 meq via ORAL
  Filled 2017-02-10: qty 2

## 2017-02-10 MED ORDER — HYDROMORPHONE HCL 1 MG/ML IJ SOLN
1.0000 mg | Freq: Once | INTRAMUSCULAR | Status: AC
  Administered 2017-02-10: 1 mg via INTRAVENOUS
  Filled 2017-02-10: qty 1

## 2017-02-10 MED ORDER — SODIUM CHLORIDE 0.9 % IV BOLUS (SEPSIS)
1000.0000 mL | Freq: Once | INTRAVENOUS | Status: AC
  Administered 2017-02-10: 1000 mL via INTRAVENOUS

## 2017-02-10 MED ORDER — PROMETHAZINE HCL 25 MG PO TABS: 25.0000 mg | ORAL_TABLET | Freq: Four times a day (QID) | ORAL | 0 refills | Status: DC | PRN

## 2017-02-10 MED ORDER — POTASSIUM CHLORIDE 10 MEQ/100ML IV SOLN
10.0000 meq | Freq: Once | INTRAVENOUS | Status: AC
  Administered 2017-02-10: 10 meq via INTRAVENOUS
  Filled 2017-02-10: qty 100

## 2017-02-10 MED ORDER — POTASSIUM CHLORIDE CRYS ER 20 MEQ PO TBCR: 20.0000 meq | EXTENDED_RELEASE_TABLET | Freq: Every day | ORAL | 0 refills | Status: DC

## 2017-02-10 MED ORDER — TRAMADOL HCL 50 MG PO TABS: 50.0000 mg | ORAL_TABLET | Freq: Four times a day (QID) | ORAL | 0 refills | Status: DC | PRN

## 2017-02-10 NOTE — ED Notes (Signed)
Pt drinking a coke, ok per MD

## 2017-02-10 NOTE — ED Triage Notes (Signed)
Pt reports a vaginal infection "for quite awhile"  She is in a wheelchair  Reports no PCP

## 2017-02-10 NOTE — ED Notes (Signed)
CRITICAL VALUE ALERT  Critical Value:  Potassium 2.4  Date & Time Notied:  02/10/17 14:07  Provider Notified:  Dr Estell HarpinZammit

## 2017-02-10 NOTE — ED Notes (Signed)
Pt back from US

## 2017-02-10 NOTE — ED Triage Notes (Signed)
Pt c/o pancreatic pain x 4 years and vaginal pain x 7-8 months-pt states she has "a growth in her vagina". Also c/o malodorous urine.

## 2017-02-10 NOTE — ED Provider Notes (Signed)
[facility] Provider Note   CSN: [id number] Arrival date & time: 02/10/17  1133     History   Chief Complaint Chief Complaint  Patient presents with  . Abdominal Pain    HPI Monique Nguyen is a 46 y.o. female.  Patient states she has pain in her vagina for months. She also states that her pancreatitis is acting up and she has some abdominal discomfort   The history is provided by the patient. No language interpreter was used.  Abdominal Pain   This is a new problem. The current episode started 6 to 12 hours ago. The problem occurs constantly. The problem has not changed since onset.The pain is associated with an unknown factor. The pain is located in the epigastric region. The pain is at a severity of 4/10. The pain is moderate. Pertinent negatives include diarrhea, frequency, hematuria and headaches.    Past Medical History:  Diagnosis Date  . Anxiety   . Bacterial vaginosis   . Chronic pain   . Depression   . GERD (gastroesophageal reflux disease)   . IBS (irritable bowel syndrome)   . Pancreatitis    ETOH  . Panic attacks   . Psoriasis     Patient Active Problem List   Diagnosis Date Noted  . 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 Facial cellulitis 09/10/2012  . Insect bite 09/10/2012  . Depression 09/10/2012  . Anxiety 09/10/2012  . Hypokalemia 09/10/2012  . Tobacco abuse 09/10/2012  . Alcohol dependence (HCC) 06/28/2012  . Benzodiazepine abuse, continuous 06/28/2012  . Suicide threat or attempt 06/28/2012    Class: Acute  . 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 Diarrhea 11/11/2010  . Nausea 11/11/2010  . Vaginal discharge 11/11/2010    Past Surgical History:  Procedure Laterality Date  . BLADDER SURGERY     X 3  . ESOPHAGOGASTRODUODENOSCOPY ENDOSCOPY    . EUS N/A 10/11/2012   Procedure: UPPER ENDOSCOPIC ULTRASOUND (EUS) LINEAR;  Surgeon: Rachael Feeaniel P Jacobs, MD;  Location: [facility];  Service:  Endoscopy;  Laterality: N/A;    OB History    No data available       Home Medications    Prior to Admission medications   Medication Sig Start Date End Date Taking? Authorizing Provider  Brewers Yeast (YEAST EXTRACT PO) Take 1 tablet by mouth daily.   Yes [provider]  phenazopyridine (PYRIDIUM) 95 MG tablet Take 95 mg by mouth 3 (three) times daily as needed for pain.   Yes [provider]  PRESCRIPTION MEDICATION Take 1 tablet by mouth daily. Patient says someone gives her medicine for anxiety. She doesn't know what it's called and she doesn't ask.   Yes [provider]  potassium chloride SA (K-DUR,KLOR-CON) 20 MEQ tablet Take 1 tablet (20 mEq total) by mouth daily. 02/10/17   Bethann BerkshireZammit, Jonnie Truxillo, MD  promethazine (PHENERGAN) 25 MG tablet Take 1 tablet (25 mg total) by mouth every 6 (six) hours as needed for nausea or vomiting. 02/10/17   Bethann BerkshireZammit, Armari Fussell, MD  traMADol (ULTRAM) 50 MG tablet Take 1 tablet (50 mg total) by mouth every 6 (six) hours as needed. 02/10/17   Bethann BerkshireZammit, Nikhita Mentzel, MD    Family History Family History  Problem Relation Age of Onset  . Depression Mother        living  . Pancreatic cancer Father        living  . Colon cancer Neg Hx   . Colon polyps Neg Hx  Social History Social History  Substance Use Topics  . Smoking status: Current Every Day Smoker    Packs/day: 1.00    Years: 8.00    Types: Cigarettes  . Smokeless tobacco: Never Used     Comment: Smokes one pack of cigarettes daily  . Alcohol use No     Comment: former     Allergies   Doxycycline and Sulfa antibiotics   Review of Systems Review of Systems  Constitutional: Negative for appetite change and fatigue.  HENT: Negative for congestion, ear discharge and sinus pressure.   Eyes: Negative for discharge.  Respiratory: Negative for cough.   Cardiovascular: Negative for chest pain.  Gastrointestinal: Positive for abdominal pain. Negative for diarrhea.    Genitourinary: Negative for frequency and hematuria.       Vaginal pain  Musculoskeletal: Negative for back pain.  Skin: Negative for rash.  Neurological: Negative for seizures and headaches.  Psychiatric/Behavioral: Negative for hallucinations.     Physical Exam Updated Vital Signs BP 113/82   Pulse (!) 106   Temp 98.3 F (36.8 C)   Resp 16   Wt 58.1 kg (128 lb)   LMP 04/12/2013 Comment: neg u pre  SpO2 97%   BMI 24.19 kg/m   Physical Exam  Constitutional: She is oriented to person, place, and time. She appears well-developed.  HENT:  Head: Normocephalic.  Eyes: Conjunctivae and EOM are normal. No scleral icterus.  Neck: Neck supple. No thyromegaly present.  Cardiovascular: Normal rate and regular rhythm.  Exam reveals no gallop and no friction rub.   No murmur heard. Pulmonary/Chest: No stridor. She has no wheezes. She has no rales. She exhibits no tenderness.  Abdominal: She exhibits no distension. There is tenderness. There is no rebound.  Minimal epigastric tenderness  Genitourinary:  Genitourinary Comments: Patient has pain on bimanual exam in the vagina superiorly  Musculoskeletal: Normal range of motion. She exhibits no edema.  Lymphadenopathy:    She has no cervical adenopathy.  Neurological: She is oriented to person, place, and time. She exhibits normal muscle tone. Coordination normal.  Skin: No rash noted. No erythema.  Psychiatric: She has a normal mood and affect. Her behavior is normal.     ED Treatments / Results  Labs (all labs ordered are listed, but only abnormal results are displayed) Labs Reviewed  COMPREHENSIVE METABOLIC PANEL - Abnormal; Notable for the following:       Result Value   Potassium 2.4 (*)    Glucose, Bld 105 (*)    BUN 5 (*)    ALT 12 (*)    All other components within normal limits  RAPID URINE DRUG SCREEN, HOSP PERFORMED - Abnormal; Notable for the following:    Benzodiazepines POSITIVE (*)    Tetrahydrocannabinol  POSITIVE (*)    All other components within normal limits  CBC WITH DIFFERENTIAL/PLATELET  ETHANOL  URINALYSIS, ROUTINE W REFLEX MICROSCOPIC  LIPASE, BLOOD  I-STAT BETA HCG BLOOD, ED ([facility], [facility], [facility] ONLY)    EKG  EKG Interpretation None       Radiology US Transvaginal Non-ob  Result Date: 02/10/2017 CLINICAL DATA:  47 year old postmenopausal female with pelvic pain. EXAM: TRANSABDOMINAL AND TRANSVAGINAL ULTRASOUND OF PELVIS TECHNIQUE: Both transabdominal and transvaginal ultrasound examinations of the pelvis were performed. Transabdominal technique was performed for global imaging of the pelvis including uterus, ovaries, adnexal regions, and pelvic cul-de-sac. It was necessary to proceed with endovaginal exam following the transabdominal exam to visualize the ovaries and endometrium. COMPARISON:  None  FINDINGS: Uterus Measurements: 6.3 x 3 x 4.8 cm. No fibroids or other mass visualized. Endometrium Thickness: 3.1 mm.  No focal abnormality visualized. Right ovary Not visualized Left ovary Measurements: 1.9 x 1.6 x 1.4 cm. Normal appearance/no adnexal mass. Other findings No abnormal free fluid or adnexal mass. IMPRESSION: 1. No acute abnormality 2. Normal uterus and left ovary 3. Right ovary not visualized. No evidence of free fluid or adnexal mass. Electronically Signed   By: Harmon Pier M.D.   On: 02/10/2017 15:30   US Pelvis Complete  Result Date: 02/10/2017 CLINICAL DATA:  46 year old postmenopausal female with pelvic pain. EXAM: TRANSABDOMINAL AND TRANSVAGINAL ULTRASOUND OF PELVIS TECHNIQUE: Both transabdominal and transvaginal ultrasound examinations of the pelvis were performed. Transabdominal technique was performed for global imaging of the pelvis including uterus, ovaries, adnexal regions, and pelvic cul-de-sac. It was necessary to proceed with endovaginal exam following the transabdominal exam to visualize the ovaries and endometrium. COMPARISON:  None FINDINGS: Uterus Measurements: 6.3  x 3 x 4.8 cm. No fibroids or other mass visualized. Endometrium Thickness: 3.1 mm.  No focal abnormality visualized. Right ovary Not visualized Left ovary Measurements: 1.9 x 1.6 x 1.4 cm. Normal appearance/no adnexal mass. Other findings No abnormal free fluid or adnexal mass. IMPRESSION: 1. No acute abnormality 2. Normal uterus and left ovary 3. Right ovary not visualized. No evidence of free fluid or adnexal mass. Electronically Signed   By: Harmon Pier M.D.   On: 02/10/2017 15:30    Procedures Procedures (including critical care time)  Medications Ordered in ED Medications  sodium chloride 0.9 % bolus 1,000 mL (0 mLs Intravenous Stopped 02/10/17 1429)  HYDROmorphone (DILAUDID) injection 1 mg (1 mg Intravenous Given 02/10/17 1314)  potassium chloride SA (K-DUR,KLOR-CON) CR tablet 40 mEq (40 mEq Oral Given 02/10/17 1422)  potassium chloride 10 mEq in 100 mL IVPB (0 mEq Intravenous Stopped 02/10/17 1524)     Initial Impression / Assessment and Plan / ED Course  I have reviewed the triage vital signs and the nursing notes.  Pertinent labs & imaging results that were available during my care of the patient were reviewed by me and considered in my medical decision making (see chart for details).     Labs unremarkable except for hypokalemia. Patient will be sent home with pain medicine nausea medicine and some potassium replacement and is referred to the [facility] clinic for her potassium and Dr. Despina Hidden for vaginal pain  Final Clinical Impressions(s) / ED Diagnoses   Final diagnoses:  Hypokalemia    New Prescriptions New Prescriptions   POTASSIUM CHLORIDE SA (K-DUR,KLOR-CON) 20 MEQ TABLET    Take 1 tablet (20 mEq total) by mouth daily.   PROMETHAZINE (PHENERGAN) 25 MG TABLET    Take 1 tablet (25 mg total) by mouth every 6 (six) hours as needed for nausea or vomiting.   TRAMADOL (ULTRAM) 50 MG TABLET    Take 1 tablet (50 mg total) by mouth every 6 (six) hours as needed.     Bethann Berkshire, MD 02/10/17 [phone]

## 2017-02-10 NOTE — ED Notes (Signed)
Patient given discharge instruction, verbalized understand. IV removed, band aid applied. Patient ambulatory out of the department.  

## 2017-02-10 NOTE — Discharge Instructions (Signed)
[facility] for [facility] low [facility]   Follow up with dr. Despina HiddenEure for your vaginal pain

## 2017-03-12 ENCOUNTER — Encounter (HOSPITAL_COMMUNITY): Payer: Self-pay | Admitting: *Deleted

## 2017-03-12 DIAGNOSIS — F17210 Nicotine dependence, cigarettes, uncomplicated: Secondary | ICD-10-CM | POA: Insufficient documentation

## 2017-03-12 DIAGNOSIS — R1084 Generalized abdominal pain: Secondary | ICD-10-CM | POA: Insufficient documentation

## 2017-03-12 DIAGNOSIS — E876 Hypokalemia: Secondary | ICD-10-CM | POA: Insufficient documentation

## 2017-03-12 DIAGNOSIS — G8929 Other chronic pain: Secondary | ICD-10-CM | POA: Insufficient documentation

## 2017-03-12 DIAGNOSIS — Z5320 Procedure and treatment not carried out because of patient's decision for unspecified reasons: Secondary | ICD-10-CM | POA: Insufficient documentation

## 2017-03-12 DIAGNOSIS — E872 Acidosis: Secondary | ICD-10-CM | POA: Insufficient documentation

## 2017-03-12 NOTE — ED Triage Notes (Signed)
Pt c/o pancreatic type pain along with vaginal pain for over 3 weeks, was seen in er a month ago for same, pt reports that she continues to have pain with nausea and diarrhea.

## 2017-03-13 ENCOUNTER — Emergency Department (HOSPITAL_COMMUNITY)
Admission: EM | Admit: 2017-03-13 | Discharge: 2017-03-13 | Discharge status: Against Medical Advice | Payer: Self-pay | Attending: Emergency Medicine | Admitting: Emergency Medicine

## 2017-03-13 DIAGNOSIS — E872 Acidosis, unspecified: Secondary | ICD-10-CM

## 2017-03-13 DIAGNOSIS — R109 Unspecified abdominal pain: Secondary | ICD-10-CM

## 2017-03-13 DIAGNOSIS — E876 Hypokalemia: Secondary | ICD-10-CM

## 2017-03-13 DIAGNOSIS — G8929 Other chronic pain: Secondary | ICD-10-CM

## 2017-03-13 LAB — COMPREHENSIVE METABOLIC PANEL
ALBUMIN: 4.6 g/dL (ref 3.5–5.0)
ALT: 17 U/L (ref 14–54)
ANION GAP: 19 — AB (ref 5–15)
AST: 23 U/L (ref 15–41)
Alkaline Phosphatase: 78 U/L (ref 38–126)
BUN: 12 mg/dL (ref 6–20)
CHLORIDE: 99 mmol/L — AB (ref 101–111)
CO2: 20 mmol/L — AB (ref 22–32)
Calcium: 9.3 mg/dL (ref 8.9–10.3)
Creatinine, Ser: 0.69 mg/dL (ref 0.44–1.00)
GFR calc Af Amer: >60 mL/min (ref >60)
GFR calc non Af Amer: >60 mL/min (ref >60)
GLUCOSE: 75 mg/dL (ref 65–99)
POTASSIUM: 3 mmol/L — AB (ref 3.5–5.1)
SODIUM: 138 mmol/L (ref 135–145)
TOTAL PROTEIN: 7.8 g/dL (ref 6.5–8.1)
Total Bilirubin: 1.2 mg/dL (ref 0.3–1.2)

## 2017-03-13 LAB — CBC WITH DIFFERENTIAL/PLATELET
BASOS PCT: 0 %
Basophils Absolute: 0 10*3/uL (ref 0.0–0.1)
Eosinophils Absolute: 0.2 10*3/uL (ref 0.0–0.7)
Eosinophils Relative: 2 %
HEMATOCRIT: 43.9 % (ref 36.0–46.0)
HEMOGLOBIN: 15.5 g/dL — AB (ref 12.0–15.0)
LYMPHS ABS: 2.7 10*3/uL (ref 0.7–4.0)
Lymphocytes Relative: 30 %
MCH: 33.1 pg (ref 26.0–34.0)
MCHC: 35.3 g/dL (ref 30.0–36.0)
MCV: 93.8 fL (ref 78.0–100.0)
MONOS PCT: 11 %
Monocytes Absolute: 0.9 10*3/uL (ref 0.1–1.0)
NEUTROS ABS: 5 10*3/uL (ref 1.7–7.7)
NEUTROS PCT: 57 %
Platelets: 308 10*3/uL (ref 150–400)
RBC: 4.68 MIL/uL (ref 3.87–5.11)
RDW: 13 % (ref 11.5–15.5)
WBC: 8.8 10*3/uL (ref 4.0–10.5)

## 2017-03-13 LAB — ETHANOL: Alcohol, Ethyl (B): <5 mg/dL (ref <10)

## 2017-03-13 LAB — LIPASE, BLOOD: Lipase: 26 U/L (ref 11–51)

## 2017-03-13 MED ORDER — SODIUM CHLORIDE 0.9 % IV BOLUS (SEPSIS): 1000.0000 mL | Freq: Once | INTRAVENOUS | Status: DC

## 2017-03-13 MED ORDER — ONDANSETRON HCL 4 MG/2ML IJ SOLN: 4.0000 mg | Freq: Once | INTRAMUSCULAR | Status: DC

## 2017-03-13 MED ORDER — FAMOTIDINE IN NACL 20-0.9 MG/50ML-% IV SOLN: 20.0000 mg | Freq: Once | INTRAVENOUS | Status: DC

## 2017-03-13 MED ORDER — SODIUM CHLORIDE 0.9 % IV BOLUS (SEPSIS)
1000.0000 mL | Freq: Once | INTRAVENOUS | Status: AC
  Administered 2017-03-13: 1000 mL via INTRAVENOUS

## 2017-03-13 NOTE — ED Notes (Signed)
Pt returned to room saying she wants the IV removed & she was going home to get in her bed. Pt stated she would return if she thought she needed to tomorrow. Wheelchair offered to pt. Pt ambulated out of the department on her own power w/ family member.

## 2017-03-13 NOTE — ED Provider Notes (Signed)
[facility]-EMERGENCY DEPT Provider Note   CSN: [id number] Arrival date & time: 03/12/17  10[birth year]   Time seen 01:35 AM  History   Chief Complaint Chief Complaint  Patient presents with  . Abdominal Pain    HPI Monique Nguyen is a 46 y.o. female.  HPI  Patient states she has been having a burning pain in her chest, abdomen, and back and her ovaries feel like "2 boiled eggs" for "a while" meaning 3-4 years. She states she's been unable to eat for about 3 weeks. She states as soon as she eats her pain gets worse. She denies nausea or vomiting but states she has diarrhea immediately if she eats or drinks. She states she's having 6-7 watery stools a day. She denies fever. She states she's lost 20 pounds in the past couple of weeks.She denies any bleeding. She denies any reflux symptoms. She states she feels tired and lightheaded. 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 She states the last time she drank any alcohol was 2 months ago. She states she is followed by gastroenterology at [facility]. She denies being on any prescription medication. Patient states her husband died in 2007/10/25 and she started drinking heavily after that.  PCP none GI Dr Lendon Collar at [facility]  Past Medical History:  Diagnosis Date  . Anxiety   . Bacterial vaginosis   . Chronic pain   . Depression   . GERD (gastroesophageal reflux disease)   . IBS (irritable bowel syndrome)   . Pancreatitis    ETOH  . Panic attacks   . Psoriasis     Patient Active Problem List   Diagnosis Date Noted  . 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 Facial cellulitis 09/10/2012  . Insect bite 09/10/2012  . Depression 09/10/2012  . Anxiety 09/10/2012  . Hypokalemia 09/10/2012  . Tobacco abuse 09/10/2012  . Alcohol dependence (HCC) 06/28/2012  . Benzodiazepine abuse, continuous (HCC) 06/28/2012  . Suicide threat or attempt 06/28/2012    Class: Acute  . 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  Diarrhea 11/11/2010  . Nausea 11/11/2010  . Vaginal discharge 11/11/2010    Past Surgical History:  Procedure Laterality Date  . BLADDER SURGERY     X 3  . ESOPHAGOGASTRODUODENOSCOPY ENDOSCOPY    . EUS N/A 10/11/2012   Procedure: UPPER ENDOSCOPIC ULTRASOUND (EUS) LINEAR;  Surgeon: Rachael Fee, MD;  Location: [facility];  Service: Endoscopy;  Laterality: N/A;    OB History    No data available       Home Medications    Prior to Admission medications   Medication Sig Start Date End Date Taking? Authorizing Provider  Brewers Yeast (YEAST EXTRACT PO) Take 1 tablet by mouth daily.    [provider]  phenazopyridine (PYRIDIUM) 95 MG tablet Take 95 mg by mouth 3 (three) times daily as needed for pain.    [provider]  potassium chloride SA (K-DUR,KLOR-CON) 20 MEQ tablet Take 1 tablet (20 mEq total) by mouth daily. 02/10/17   Bethann Berkshire, MD  PRESCRIPTION MEDICATION Take 1 tablet by mouth daily. Patient says someone gives her medicine for anxiety. She doesn't know what it's called and she doesn't ask.    [provider]  promethazine (PHENERGAN) 25 MG tablet Take 1 tablet (25 mg total) by mouth every 6 (six) hours as needed for nausea or vomiting. 02/10/17   Bethann Berkshire, MD  traMADol (ULTRAM) 50 MG tablet Take 1 tablet (50 mg total) by  mouth every 6 (six) hours as needed. 02/10/17   Bethann Berkshire, MD    Family History Family History  Problem Relation Age of Onset  . Depression Mother        living  . Pancreatic cancer Father        living  . Colon cancer Neg Hx   . Colon polyps Neg Hx     Social History Social History  Substance Use Topics  . Smoking status: Current Every Day Smoker    Packs/day: 1.00    Years: 8.00    Types: Cigarettes  . Smokeless tobacco: Never Used     Comment: Smokes one pack of cigarettes daily  . Alcohol use No     Comment: former  smokes 1/2 ppd widowed   Allergies   Doxycycline and Sulfa  antibiotics   Review of Systems Review of Systems  All other systems reviewed and are negative.    Physical Exam Updated Vital Signs BP 111/90   Pulse (!) 105   Temp 98.4 F (36.9 C) (Oral)   Resp 18   Ht  (1.549 m)   Wt 40.4 kg (89 lb)   LMP 04/12/2013 Comment: neg u pre  SpO2 94%   BMI 16.82 kg/m   Vital signs normal except tachycardia   Physical Exam  Constitutional: She is oriented to person, place, and time.  Thin gaunt female  HENT:  Head: Normocephalic and atraumatic.  Right Ear: External ear normal.  Left Ear: External ear normal.  Mouth/Throat: Mucous membranes are dry.  Eyes: Pupils are equal, round, and reactive to light. Conjunctivae and EOM are normal. No scleral icterus.  Neck: Normal range of motion. Neck supple.  Cardiovascular: Tachycardia present.   Pulmonary/Chest: Effort normal. No accessory muscle usage. No tachypnea. No respiratory distress. She has no decreased breath sounds. She has no wheezes. She has no rhonchi. She has no rales.  Abdominal: Normal appearance. Bowel sounds are increased. There is generalized tenderness.  Musculoskeletal: Normal range of motion. She exhibits edema.  Neurological: She is alert and oriented to person, place, and time. No cranial nerve deficit.  Skin: Skin is warm and dry.  Psychiatric: Her mood appears anxious. Her speech is rapid and/or pressured. She is agitated.  Nursing note and vitals reviewed.    ED Treatments / Results  Labs (all labs ordered are listed, but only abnormal results are displayed) Results for orders placed or performed during the hospital encounter of 03/13/17  Comprehensive metabolic panel  Result Value Ref Range   Sodium 138 135 - 145 mmol/L   Potassium 3.0 (L) 3.5 - 5.1 mmol/L   Chloride 99 (L) 101 - 111 mmol/L   CO2 20 (L) 22 - 32 mmol/L   Glucose, Bld 75 65 - 99 mg/dL   BUN 12 6 - 20 mg/dL   Creatinine, Ser 1.61 0.44 - 1.00 mg/dL   Calcium 9.3 8.9 - 09.6 mg/dL   Total  Protein 7.8 6.5 - 8.1 g/dL   Albumin 4.6 3.5 - 5.0 g/dL   AST 23 15 - 41 U/L   ALT 17 14 - 54 U/L   Alkaline Phosphatase 78 38 - 126 U/L   Total Bilirubin 1.2 0.3 - 1.2 mg/dL   GFR calc non Af Amer >60 >60 mL/min   GFR calc Af Amer >60 >60 mL/min   Anion gap 19 (H) 5 - 15  Ethanol  Result Value Ref Range   Alcohol, Ethyl (B) <5 <10 mg/dL  Lipase, blood  Result Value Ref Range   Lipase 26 11 - 51 U/L  CBC with Differential  Result Value Ref Range   WBC 8.8 4.0 - 10.5 K/uL   RBC 4.68 3.87 - 5.11 MIL/uL   Hemoglobin 15.5 (H) 12.0 - 15.0 g/dL   HCT 78.2 95.6 - 21.3 %   MCV 93.8 78.0 - 100.0 fL   MCH 33.1 26.0 - 34.0 pg   MCHC 35.3 30.0 - 36.0 g/dL   RDW 08.6 57.8 - 46.9 %   Platelets 308 150 - 400 K/uL   Neutrophils Relative % 57 %   Neutro Abs 5.0 1.7 - 7.7 K/uL   Lymphocytes Relative 30 %   Lymphs Abs 2.7 0.7 - 4.0 K/uL   Monocytes Relative 11 %   Monocytes Absolute 0.9 0.1 - 1.0 K/uL   Eosinophils Relative 2 %   Eosinophils Absolute 0.2 0.0 - 0.7 K/uL   Basophils Relative 0 %   Basophils Absolute 0.0 0.0 - 0.1 K/uL   Laboratory interpretation all normal except hypokalemia, metabolic acidosis    EKG  EKG Interpretation None       Radiology No results found.    February 10, 2017 TRANSABDOMINAL AND TRANSVAGINAL ULTRASOUND OF PELVIS  COMPARISON:  None . IMPRESSION: 1. No acute abnormality 2. Normal uterus and left ovary 3. Right ovary not visualized. No evidence of free fluid or adnexal mass.   Electronically Signed   By: Harmon Pier M.D.   On: 02/10/2017 15:30  February 11, 2017 CT ABDOMEN AND PELVIS WITH CONTRAST  TECHNIQUE: Multidetector CT imaging of the abdomen and pelvis was performed using the standard protocol following bolus administration of intravenous contrast.  CONTRAST:  ISOVUE-300 IOPAMIDOL (ISOVUE-300) INJECTION 61%  COMPARISON:  05/14/2015 pelvis ultrasound. 03/06/2015 and 09/10/2012 CT of abdomen and pelvis.  03/28/2013 abdomen MRI.  FINDINGS: Lower chest:  No acute findings.  Hepatobiliary: No masses or other significant abnormality.  Pancreas: Unchanged severe atrophy of the mid pancreatic body and tail with dilatation of the pancreatic duct at the junction between normal pancreas and atrophic pancreas. There is faint stranding of mesenteric fat and prominent lymph nodes inferior to the residual pancreatic body.  Spleen: Within normal limits in size and appearance. 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  Adrenals/Urinary Tract: No mass identified. Mild prominence of collecting systems bilaterally without an obstructing lesion. Moderate bladder distention. Slight urothelial enhancement of the ureters best appreciated in the right lower ureter.  Stomach/Bowel: No evidence of obstruction, inflammatory process, or abnormal fluid collections. Normal appendix.  Vascular/Lymphatic: No pathologically enlarged lymph nodes. No evidence of abdominal aortic aneurysm.  Reproductive: No mass or other significant abnormality.  Other: None.  Musculoskeletal:  No suspicious bone lesions identified.  IMPRESSION: 1. Mild prominence of the bilateral renal collecting systems and slight urothelial enhancement may represent a infection of the collecting systems. No mass or obstructing stone is identified. 2. Faint mesenteric fat stranding and prominent lymph nodes inferior to the residual pancreatic body may represent reactive inflammatory changes, possibly related to the pancreatitis. Correlation with lipase is recommended. 3. Stable atrophy pancreatic body and tail. 4. 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   Electronically Signed   By: Mitzi Hansen M.D.   On: 02/12/2016 19:58  Procedures Procedures (including critical care time)  Medications Ordered in  ED Medications  sodium chloride 0.9 % bolus 1,000 mL (0 mLs Intravenous Stopped 03/13/17 0232)  Initial Impression / Assessment and Plan / ED Course  I have reviewed the triage vital signs and the nursing notes.  Pertinent labs & imaging results that were available during my care of the patient were reviewed by me and considered in my medical decision making (see chart for details).     Patient was given IV fluids, nursing staff states patient left without giving a reason.    When I checked [facility]'s records patient was last seen by the gastroenterologist in July 2017. Since then she's had multiple phone calls requesting nausea and pain medications.  Interestingly despite showing in her records that she gets narcotics when I checked the [place] database there is no entries in the past 2 years.  Final Clinical Impressions(s) / ED Diagnoses   Final diagnoses:  Chronic abdominal pain  Hypokalemia  Metabolic acidosis   Pt left AMA   Devoria Albe, MD, Concha Pyo, MD 03/13/17 [phone]

## 2017-08-03 ENCOUNTER — Emergency Department (HOSPITAL_COMMUNITY): Payer: Self-pay

## 2017-08-03 ENCOUNTER — Emergency Department (HOSPITAL_COMMUNITY)
Admission: EM | Admit: 2017-08-03 | Discharge: 2017-08-04 | Disposition: A | Discharge status: Home / Self Care | Payer: Self-pay | Attending: Emergency Medicine | Admitting: Emergency Medicine

## 2017-08-03 ENCOUNTER — Other Ambulatory Visit: Payer: Self-pay

## 2017-08-03 ENCOUNTER — Encounter (HOSPITAL_COMMUNITY): Payer: Self-pay | Admitting: Emergency Medicine

## 2017-08-03 DIAGNOSIS — N390 Urinary tract infection, site not specified: Secondary | ICD-10-CM | POA: Insufficient documentation

## 2017-08-03 DIAGNOSIS — E876 Hypokalemia: Secondary | ICD-10-CM | POA: Insufficient documentation

## 2017-08-03 DIAGNOSIS — F17210 Nicotine dependence, cigarettes, uncomplicated: Secondary | ICD-10-CM | POA: Insufficient documentation

## 2017-08-03 LAB — COMPREHENSIVE METABOLIC PANEL
ALK PHOS: 52 U/L (ref 38–126)
ALT: 19 U/L (ref 14–54)
AST: 19 U/L (ref 15–41)
Albumin: 3.9 g/dL (ref 3.5–5.0)
Anion gap: 12 (ref 5–15)
BILIRUBIN TOTAL: 0.5 mg/dL (ref 0.3–1.2)
BUN: 7 mg/dL (ref 6–20)
CO2: 24 mmol/L (ref 22–32)
CREATININE: 0.56 mg/dL (ref 0.44–1.00)
Calcium: 8.8 mg/dL — ABNORMAL LOW (ref 8.9–10.3)
Chloride: 104 mmol/L (ref 101–111)
GFR calc Af Amer: >60 mL/min (ref >60)
Glucose, Bld: 84 mg/dL (ref 65–99)
Potassium: 2.9 mmol/L — ABNORMAL LOW (ref 3.5–5.1)
Sodium: 140 mmol/L (ref 135–145)
TOTAL PROTEIN: 6.7 g/dL (ref 6.5–8.1)

## 2017-08-03 LAB — CBC
HCT: 34.1 % — ABNORMAL LOW (ref 36.0–46.0)
Hemoglobin: 11.3 g/dL — ABNORMAL LOW (ref 12.0–15.0)
MCH: 32.8 pg (ref 26.0–34.0)
MCHC: 33.1 g/dL (ref 30.0–36.0)
MCV: 99.1 fL (ref 78.0–100.0)
PLATELETS: 365 10*3/uL (ref 150–400)
RBC: 3.44 MIL/uL — ABNORMAL LOW (ref 3.87–5.11)
RDW: 13.3 % (ref 11.5–15.5)
WBC: 5.4 10*3/uL (ref 4.0–10.5)

## 2017-08-03 LAB — URINALYSIS, ROUTINE W REFLEX MICROSCOPIC
BILIRUBIN URINE: NEGATIVE
Glucose, UA: NEGATIVE mg/dL
Hgb urine dipstick: NEGATIVE
Ketones, ur: NEGATIVE mg/dL
NITRITE: POSITIVE — AB
PH: 5 (ref 5.0–8.0)
Protein, ur: NEGATIVE mg/dL
SPECIFIC GRAVITY, URINE: 1.01 (ref 1.005–1.030)

## 2017-08-03 LAB — LIPASE, BLOOD: Lipase: 21 U/L (ref 11–51)

## 2017-08-03 LAB — PREGNANCY, URINE: Preg Test, Ur: NEGATIVE

## 2017-08-03 MED ORDER — POTASSIUM CHLORIDE 10 MEQ/100ML IV SOLN
10.0000 meq | Freq: Once | INTRAVENOUS | Status: AC
  Administered 2017-08-03: 10 meq via INTRAVENOUS
  Filled 2017-08-03: qty 100

## 2017-08-03 MED ORDER — METOCLOPRAMIDE HCL 5 MG/ML IJ SOLN
10.0000 mg | Freq: Once | INTRAMUSCULAR | Status: AC
  Administered 2017-08-03: 10 mg via INTRAVENOUS
  Filled 2017-08-03: qty 2

## 2017-08-03 MED ORDER — MORPHINE SULFATE (PF) 2 MG/ML IV SOLN
2.0000 mg | Freq: Once | INTRAVENOUS | Status: AC
  Administered 2017-08-03: 2 mg via INTRAVENOUS
  Filled 2017-08-03: qty 1

## 2017-08-03 NOTE — ED Triage Notes (Signed)
Pt c/o abd pain with n/v/d for a while. Pt states her pancreas is acting up.

## 2017-08-04 MED ORDER — CEPHALEXIN 500 MG PO CAPS
500.0000 mg | ORAL_CAPSULE | Freq: Four times a day (QID) | ORAL | 0 refills | Status: DC
Start: 2017-08-04 — End: 2018-07-28

## 2017-08-04 MED ORDER — POTASSIUM CHLORIDE CRYS ER 20 MEQ PO TBCR: 20.0000 meq | EXTENDED_RELEASE_TABLET | Freq: Two times a day (BID) | ORAL | 0 refills | Status: DC

## 2017-08-04 MED ORDER — IOPAMIDOL (ISOVUE-300) INJECTION 61%
75.0000 mL | Freq: Once | INTRAVENOUS | Status: AC | PRN
  Administered 2017-08-04: 75 mL via INTRAVENOUS

## 2017-08-04 NOTE — ED Provider Notes (Signed)
[facility] EMERGENCY DEPARTMENT Provider Note   CSN: [id number] Arrival date & time: 08/03/17  1922     History   Chief Complaint Chief Complaint  Patient presents with  . Emesis    HPI Monique Nguyen is a 47 y.o. female.  HPI   Monique Nguyen is a 47 y.o. female with history of chronic pancreatitis who presents to the Emergency Department complaining of recurrent abdominal pain for nearly 1 year.  She states the pain has been worsening for the last month.  She states the pain feels similar to her previous chronic pancreatitis pain.  She states that she has worsening pain with food intake and has been limiting herself to fluids and baby food.  She does report intermittent nausea vomiting and diarrhea which she also associates with her pancreatitis.  She states that she has been unable to see a gastroenterologist because she does not have insurance and cannot afford an office visit.  She comes to the emergency room this evening for pain control.  She denies fever, chest pain, shortness of breath, bloody or black stools, and bloody vomitus.  She denies recent alcohol use   Past Medical History:  Diagnosis Date  . Anxiety   . Bacterial vaginosis   . Chronic pain   . Depression   . GERD (gastroesophageal reflux disease)   . IBS (irritable bowel syndrome)   . Pancreatitis    ETOH  . Panic attacks   . Psoriasis     Patient Active Problem List   Diagnosis Date Noted  . 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 Facial cellulitis 09/10/2012  . Insect bite 09/10/2012  . Depression 09/10/2012  . Anxiety 09/10/2012  . Hypokalemia 09/10/2012  . Tobacco abuse 09/10/2012  . Alcohol dependence (HCC) 06/28/2012  . Benzodiazepine abuse, continuous (HCC) 06/28/2012  . Suicide threat or attempt 06/28/2012    Class: Acute  . 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 Diarrhea 11/11/2010  . Nausea 11/11/2010  . Vaginal discharge 11/11/2010     Past Surgical History:  Procedure Laterality Date  . BLADDER SURGERY     X 3  . ESOPHAGOGASTRODUODENOSCOPY ENDOSCOPY    . EUS N/A 10/11/2012   Procedure: UPPER ENDOSCOPIC ULTRASOUND (EUS) LINEAR;  Surgeon: Rachael Fee, MD;  Location: [facility];  Service: Endoscopy;  Laterality: N/A;    OB History    No data available       Home Medications    Prior to Admission medications   Medication Sig Start Date End Date Taking? Authorizing Provider  Brewers Yeast (YEAST EXTRACT PO) Take 1 tablet by mouth daily.    [provider]  phenazopyridine (PYRIDIUM) 95 MG tablet Take 95 mg by mouth 3 (three) times daily as needed for pain.    [provider]  potassium chloride SA (K-DUR,KLOR-CON) 20 MEQ tablet Take 1 tablet (20 mEq total) by mouth daily. 02/10/17   Bethann Berkshire, MD  PRESCRIPTION MEDICATION Take 1 tablet by mouth daily. Patient says someone gives her medicine for anxiety. She doesn't know what it's called and she doesn't ask.    [provider]  promethazine (PHENERGAN) 25 MG tablet Take 1 tablet (25 mg total) by mouth every 6 (six) hours as needed for nausea or vomiting. 02/10/17   Bethann Berkshire, MD  traMADol (ULTRAM) 50 MG tablet Take 1 tablet (50 mg total) by mouth every 6 (six) hours as needed. 02/10/17   Bethann Berkshire, MD  DULoxetine (CYMBALTA)  30 MG capsule Take 1 capsule (30 mg total) by mouth daily. Patient not taking: Reported on 02/27/2015 10/28/14 03/06/15  Eber Hong, MD    Family History Family History  Problem Relation Age of Onset  . Depression Mother        living  . Pancreatic cancer Father        living  . Colon cancer Neg Hx   . Colon polyps Neg Hx     Social History Social History   Tobacco Use  . Smoking status: Current Every Day Smoker    Packs/day: 1.00    Years: 8.00    Pack years: 8.00    Types: Cigarettes  . Smokeless tobacco: Never Used  . Tobacco comment: Smokes one pack of cigarettes daily  Substance  Use Topics  . Alcohol use: No    Alcohol/week: 1.8 oz    Types: 3 Cans of beer per week    Comment: former  . Drug use: Yes    Types: Marijuana    Comment: last time used was last night     Allergies   Doxycycline and Sulfa antibiotics   Review of Systems Review of Systems  Constitutional: Negative for appetite change, chills and fever.  Respiratory: Negative for chest tightness and shortness of breath.   Cardiovascular: Negative for chest pain.  Gastrointestinal: Positive for abdominal pain, diarrhea, nausea and vomiting. Negative for blood in stool.  Genitourinary: Negative for decreased urine volume, difficulty urinating, dysuria and flank pain.  Musculoskeletal: Negative for back pain.  Skin: Negative for color change and rash.  Neurological: Negative for dizziness, weakness and numbness.  Hematological: Negative for adenopathy.  All other systems reviewed and are negative.    Physical Exam Updated Vital Signs BP 93/66   Pulse 88   Temp (!) 97.5 F (36.4 C) (Oral)   Resp 15   Ht 5\' 1"  (1.549 m)   Wt 44.5 kg (98 lb)   LMP 04/12/2013 Comment: neg u pre  SpO2 95%   BMI 18.52 kg/m   Physical Exam  Constitutional: She is oriented to person, place, and time. She appears well-developed and well-nourished. No distress.  Frail appearing female that appears older than stated age  HENT:  Head: Normocephalic and atraumatic.  Mouth/Throat: Oropharynx is clear and moist.  Neck: Normal range of motion. Neck supple.  Cardiovascular: Normal rate, regular rhythm and intact distal pulses.  No murmur heard. Pulmonary/Chest: Effort normal and breath sounds normal. No respiratory distress.  Abdominal: Soft. Normal appearance and bowel sounds are normal. She exhibits no distension and no mass. There is generalized tenderness. There is no rigidity, no rebound, no guarding and no CVA tenderness.  Generalized abdominal pain without guarding or rebound tenderness,.     Musculoskeletal: Normal range of motion. She exhibits no edema.  Neurological: She is alert and oriented to person, place, and time. She exhibits normal muscle tone. Coordination normal.  Skin: Skin is warm and dry.  Psychiatric:  Pt is tearful.  Answers questions appropriately.  No SI or HI  Nursing note and vitals reviewed.    ED Treatments / Results  Labs (all labs ordered are listed, but only abnormal results are displayed) Labs Reviewed  COMPREHENSIVE METABOLIC PANEL - Abnormal; Notable for the following components:      Result Value   Potassium 2.9 (*)    Calcium 8.8 (*)    All other components within normal limits  CBC - Abnormal; Notable for the following components:   RBC 3.44 (*)  Hemoglobin 11.3 (*)    HCT 34.1 (*)    All other components within normal limits  URINALYSIS, ROUTINE W REFLEX MICROSCOPIC - Abnormal; Notable for the following components:   Color, Urine AMBER (*)    APPearance HAZY (*)    Nitrite POSITIVE (*)    Leukocytes, UA TRACE (*)    Bacteria, UA FEW (*)    Squamous Epithelial / LPF 0-5 (*)    All other components within normal limits  URINE CULTURE  LIPASE, BLOOD  PREGNANCY, URINE    EKG  EKG Interpretation None      EKG reviewed by Dr. Fayrene Fearing   Radiology Ct Abdomen Pelvis W Contrast  Result Date: 08/04/2017 CLINICAL DATA:  47 year old female with abdominal pain, nausea and vomiting history of pancreatitis. EXAM: CT ABDOMEN AND PELVIS WITH CONTRAST TECHNIQUE: Multidetector CT imaging of the abdomen and pelvis was performed using the standard protocol following bolus administration of intravenous contrast. CONTRAST:  75mL ISOVUE-300 IOPAMIDOL (ISOVUE-300) INJECTION 61% COMPARISON:  Abdominal CT dated 02/12/2016 and pelvic ultrasound dated 02/10/2017 FINDINGS: Lower chest: Minimal bibasilar dependent atelectatic changes. The visualized lung bases are otherwise clear. No intra-abdominal free air or free fluid. Hepatobiliary: No focal liver  abnormality is seen. No gallstones, gallbladder wall thickening, or biliary dilatation. Pancreas: There is stable atrophy of the distal body and tail of the pancreas with dilatation of the main pancreatic duct similar to prior CT and probably sequela of recurrent/chronic pancreatitis. No active inflammatory changes. Spleen: Top-normal spleen size. Adrenals/Urinary Tract: Adrenal glands are unremarkable. Kidneys are normal, without renal calculi, focal lesion, or hydronephrosis. Bladder is unremarkable. Stomach/Bowel: Stomach is within normal limits. Appendix appears normal. No evidence of bowel wall thickening, distention, or inflammatory changes. Vascular/Lymphatic: There is mild noncalcified plaque along the distal abdominal aorta. 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 The SMV and main portal vein are patent no adenopathy. Reproductive: The uterus and ovaries are grossly unremarkable. No pelvic mass. Other: None Musculoskeletal: No acute or significant osseous findings. IMPRESSION: 1. No acute intra-abdominal or pelvic pathology. 2. Stable atrophy of the distal body and tail of the pancreas consistent with sequela of chronic pancreatitis. No definite active inflammatory changes. Correlation with clinical exam and pancreatic enzymes recommended. No fluid collection. 3. No bowel obstruction or active inflammation.  Normal appendix. 4. 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 Electronically Signed   By: Elgie Collard M.D.   On: 08/04/2017 01:25    Procedures Procedures (including critical care time)  Medications Ordered in ED Medications  morphine 2 MG/ML injection 2 mg (2 mg Intravenous Given 08/03/17 2311)  metoCLOPramide (REGLAN) injection 10 mg (10 mg Intravenous Given 08/03/17 2309)  potassium chloride 10 mEq in 100 mL IVPB (0 mEq Intravenous Stopped 08/04/17 0014)  iopamidol (ISOVUE-300) 61 % injection 75 mL (75 mLs Intravenous Contrast Given 08/04/17  0036)     Initial Impression / Assessment and Plan / ED Course  I have reviewed the triage vital signs and the nursing notes.  Pertinent labs & imaging results that were available during my care of the patient were reviewed by me and considered in my medical decision making (see chart for details).     Patient with likely exacerbation of chronic pancreatitis.  Nontoxic appearing.  Doubt acute abdomen.  CT w/o acute process. No active vomiting during ED stay.  Patient does have UTI and hypokalemia.  Will treat with Keflex and prescription for potassium.  Referral information provided for GI and for PCP.  Return precautions discussed.  Final Clinical Impressions(s) / ED Diagnoses   Final diagnoses:  Urinary tract infection in female  Hypokalemia    ED Discharge Orders    None       Rosey Bathriplett, Beena Catano, PA-C 08/04/17 2330    Rolland PorterJames, Mark, MD 08/07/17 2109

## 2017-08-04 NOTE — Discharge Instructions (Signed)
Try to drink plenty of water.  Take the potassium supplement as directed.  Call the provider listed to establish primary care you may also call Dr. Darrick Pennafields office to arrange follow-up appointment.

## 2017-08-04 NOTE — ED Notes (Signed)
Patient in agreement to discharge, unable to obtain e-signature due to downtime on Epic.

## 2017-08-05 LAB — URINE CULTURE: Culture: <10000 — AB

## 2017-09-11 ENCOUNTER — Emergency Department (HOSPITAL_COMMUNITY)
Admission: EM | Admit: 2017-09-11 | Discharge: 2017-09-11 | Disposition: A | Discharge status: Home / Self Care | Payer: Self-pay | Attending: Emergency Medicine | Admitting: Emergency Medicine

## 2017-09-11 ENCOUNTER — Other Ambulatory Visit: Payer: Self-pay

## 2017-09-11 ENCOUNTER — Encounter (HOSPITAL_COMMUNITY): Payer: Self-pay | Admitting: Emergency Medicine

## 2017-09-11 DIAGNOSIS — R109 Unspecified abdominal pain: Secondary | ICD-10-CM | POA: Insufficient documentation

## 2017-09-11 DIAGNOSIS — Z5321 Procedure and treatment not carried out due to patient leaving prior to being seen by health care provider: Secondary | ICD-10-CM | POA: Insufficient documentation

## 2017-09-11 LAB — LIPASE, BLOOD: LIPASE: 23 U/L (ref 11–51)

## 2017-09-11 LAB — URINALYSIS, ROUTINE W REFLEX MICROSCOPIC
Bilirubin Urine: NEGATIVE
Glucose, UA: NEGATIVE mg/dL
Hgb urine dipstick: NEGATIVE
KETONES UR: NEGATIVE mg/dL
LEUKOCYTES UA: NEGATIVE
NITRITE: NEGATIVE
PH: 5 (ref 5.0–8.0)
Protein, ur: NEGATIVE mg/dL
SPECIFIC GRAVITY, URINE: 1.003 — AB (ref 1.005–1.030)

## 2017-09-11 LAB — CBC WITH DIFFERENTIAL/PLATELET
BASOS PCT: 0 %
Basophils Absolute: 0 10*3/uL (ref 0.0–0.1)
Eosinophils Absolute: 0.3 10*3/uL (ref 0.0–0.7)
Eosinophils Relative: 3 %
HEMATOCRIT: 40 % (ref 36.0–46.0)
HEMOGLOBIN: 13.2 g/dL (ref 12.0–15.0)
LYMPHS ABS: 1.7 10*3/uL (ref 0.7–4.0)
Lymphocytes Relative: 16 %
MCH: 31.9 pg (ref 26.0–34.0)
MCHC: 33 g/dL (ref 30.0–36.0)
MCV: 96.6 fL (ref 78.0–100.0)
MONOS PCT: 8 %
Monocytes Absolute: 0.9 10*3/uL (ref 0.1–1.0)
NEUTROS ABS: 7.9 10*3/uL — AB (ref 1.7–7.7)
NEUTROS PCT: 73 %
Platelets: 296 10*3/uL (ref 150–400)
RBC: 4.14 MIL/uL (ref 3.87–5.11)
RDW: 12.3 % (ref 11.5–15.5)
WBC: 10.7 10*3/uL — AB (ref 4.0–10.5)

## 2017-09-11 LAB — COMPREHENSIVE METABOLIC PANEL
ALT: 13 U/L — ABNORMAL LOW (ref 14–54)
ANION GAP: 13 (ref 5–15)
AST: 17 U/L (ref 15–41)
Albumin: 4.2 g/dL (ref 3.5–5.0)
Alkaline Phosphatase: 94 U/L (ref 38–126)
BUN: 8 mg/dL (ref 6–20)
CHLORIDE: 104 mmol/L (ref 101–111)
CO2: 23 mmol/L (ref 22–32)
Calcium: 9.6 mg/dL (ref 8.9–10.3)
Creatinine, Ser: 0.51 mg/dL (ref 0.44–1.00)
GFR calc non Af Amer: >60 mL/min (ref >60)
Glucose, Bld: 97 mg/dL (ref 65–99)
Potassium: 3.8 mmol/L (ref 3.5–5.1)
SODIUM: 140 mmol/L (ref 135–145)
Total Bilirubin: 0.5 mg/dL (ref 0.3–1.2)
Total Protein: 7.5 g/dL (ref 6.5–8.1)

## 2017-09-11 NOTE — ED Triage Notes (Signed)
Pt states she is here for pancreatitis and generalized body aches x 2 weeks. Anxiety noted. States intermittent numbness to hands. C/o diarrhea.

## 2017-09-11 NOTE — ED Notes (Signed)
Pt advised pt she was leaving

## 2018-06-05 ENCOUNTER — Encounter (HOSPITAL_COMMUNITY): Payer: Self-pay | Admitting: Emergency Medicine

## 2018-06-05 ENCOUNTER — Emergency Department (HOSPITAL_COMMUNITY)
Admission: EM | Admit: 2018-06-05 | Discharge: 2018-06-05 | Discharge status: Against Medical Advice | Payer: Self-pay | Attending: Emergency Medicine | Admitting: Emergency Medicine

## 2018-06-05 ENCOUNTER — Other Ambulatory Visit: Payer: Self-pay

## 2018-06-05 ENCOUNTER — Emergency Department (HOSPITAL_COMMUNITY)
Admission: EM | Admit: 2018-06-05 | Discharge: 2018-06-05 | Disposition: A | Discharge status: Home / Self Care | Payer: Self-pay | Attending: Emergency Medicine | Admitting: Emergency Medicine

## 2018-06-05 DIAGNOSIS — R52 Pain, unspecified: Secondary | ICD-10-CM

## 2018-06-05 DIAGNOSIS — Z5321 Procedure and treatment not carried out due to patient leaving prior to being seen by health care provider: Secondary | ICD-10-CM | POA: Insufficient documentation

## 2018-06-05 DIAGNOSIS — Z79899 Other long term (current) drug therapy: Secondary | ICD-10-CM | POA: Insufficient documentation

## 2018-06-05 DIAGNOSIS — F17210 Nicotine dependence, cigarettes, uncomplicated: Secondary | ICD-10-CM | POA: Insufficient documentation

## 2018-06-05 DIAGNOSIS — F419 Anxiety disorder, unspecified: Secondary | ICD-10-CM | POA: Insufficient documentation

## 2018-06-05 DIAGNOSIS — R109 Unspecified abdominal pain: Secondary | ICD-10-CM | POA: Insufficient documentation

## 2018-06-05 DIAGNOSIS — K861 Other chronic pancreatitis: Secondary | ICD-10-CM | POA: Insufficient documentation

## 2018-06-05 DIAGNOSIS — M791 Myalgia, unspecified site: Secondary | ICD-10-CM | POA: Insufficient documentation

## 2018-06-05 HISTORY — DX: Alcohol abuse, uncomplicated: F10.10

## 2018-06-05 HISTORY — DX: Unspecified abdominal pain: R10.9

## 2018-06-05 HISTORY — DX: Other chronic pain: G89.29

## 2018-06-05 LAB — COMPREHENSIVE METABOLIC PANEL
ALBUMIN: 3.5 g/dL (ref 3.5–5.0)
ALT: 14 U/L (ref 0–44)
AST: 23 U/L (ref 15–41)
Alkaline Phosphatase: 92 U/L (ref 38–126)
Anion gap: 11 (ref 5–15)
BUN: 5 mg/dL — ABNORMAL LOW (ref 6–20)
CO2: 24 mmol/L (ref 22–32)
Calcium: 8.4 mg/dL — ABNORMAL LOW (ref 8.9–10.3)
Chloride: 105 mmol/L (ref 98–111)
Creatinine, Ser: 0.45 mg/dL (ref 0.44–1.00)
GFR calc Af Amer: >60 mL/min (ref >60)
GFR calc non Af Amer: >60 mL/min (ref >60)
Glucose, Bld: 77 mg/dL (ref 70–99)
Potassium: 3.3 mmol/L — ABNORMAL LOW (ref 3.5–5.1)
Sodium: 140 mmol/L (ref 135–145)
Total Bilirubin: 0.5 mg/dL (ref 0.3–1.2)
Total Protein: 6.9 g/dL (ref 6.5–8.1)

## 2018-06-05 LAB — CBC WITH DIFFERENTIAL/PLATELET
Abs Immature Granulocytes: 0.03 10*3/uL (ref 0.00–0.07)
BASOS PCT: 1 %
Basophils Absolute: 0.1 10*3/uL (ref 0.0–0.1)
Eosinophils Absolute: 1.1 10*3/uL — ABNORMAL HIGH (ref 0.0–0.5)
Eosinophils Relative: 18 %
HCT: 42 % (ref 36.0–46.0)
Hemoglobin: 13.7 g/dL (ref 12.0–15.0)
IMMATURE GRANULOCYTES: 1 %
Lymphocytes Relative: 17 %
Lymphs Abs: 1.1 10*3/uL (ref 0.7–4.0)
MCH: 34.2 pg — ABNORMAL HIGH (ref 26.0–34.0)
MCHC: 32.6 g/dL (ref 30.0–36.0)
MCV: 104.7 fL — ABNORMAL HIGH (ref 80.0–100.0)
MONOS PCT: 8 %
Monocytes Absolute: 0.5 10*3/uL (ref 0.1–1.0)
NEUTROS PCT: 55 %
Neutro Abs: 3.5 10*3/uL (ref 1.7–7.7)
Platelets: 239 10*3/uL (ref 150–400)
RBC: 4.01 MIL/uL (ref 3.87–5.11)
RDW: 14.1 % (ref 11.5–15.5)
WBC: 6.2 10*3/uL (ref 4.0–10.5)
nRBC: 0 % (ref 0.0–0.2)

## 2018-06-05 LAB — RAPID URINE DRUG SCREEN, HOSP PERFORMED
Amphetamines: NOT DETECTED
BENZODIAZEPINES: NOT DETECTED
Barbiturates: NOT DETECTED
Cocaine: NOT DETECTED
Opiates: NOT DETECTED
Tetrahydrocannabinol: NOT DETECTED

## 2018-06-05 LAB — ETHANOL: Alcohol, Ethyl (B): 50 mg/dL — ABNORMAL HIGH (ref <10)

## 2018-06-05 LAB — URINALYSIS, ROUTINE W REFLEX MICROSCOPIC
Bilirubin Urine: NEGATIVE
Glucose, UA: NEGATIVE mg/dL
Hgb urine dipstick: NEGATIVE
Ketones, ur: NEGATIVE mg/dL
Leukocytes, UA: NEGATIVE
Nitrite: POSITIVE — AB
Protein, ur: NEGATIVE mg/dL
Specific Gravity, Urine: 1.002 — ABNORMAL LOW (ref 1.005–1.030)
pH: 6 (ref 5.0–8.0)

## 2018-06-05 LAB — AMMONIA: Ammonia: 12 umol/L (ref 9–35)

## 2018-06-05 LAB — LIPASE, BLOOD: Lipase: 137 U/L — ABNORMAL HIGH (ref 11–51)

## 2018-06-05 MED ORDER — SODIUM CHLORIDE 0.9 % IV BOLUS (SEPSIS)
1000.0000 mL | Freq: Once | INTRAVENOUS | Status: AC
  Administered 2018-06-05: 1000 mL via INTRAVENOUS

## 2018-06-05 MED ORDER — SODIUM CHLORIDE 0.9 % IV SOLN: 1000.0000 mL | INTRAVENOUS | Status: DC

## 2018-06-05 NOTE — ED Notes (Signed)
Found pt trying to leave facility.  [facility] is crying and [facility]  Says that she is sad and do want to be any trouble.

## 2018-06-05 NOTE — ED Triage Notes (Signed)
Pt c/o of generalized body aches.  Pt has been drinking alcohol this morning.  C/o of multiple complaints of pain and states " I want to be in the ICU"

## 2018-06-05 NOTE — ED Triage Notes (Signed)
Pt c/o of pain in her kidneys, wants to have it checked out.  Was just seen here this am but left ama

## 2018-06-05 NOTE — ED Notes (Signed)
Monique Nguyen Bryant in to see pt.  Pt is leaving AMA and aware th at she has not completed her treatment.

## 2018-06-05 NOTE — ED Provider Notes (Signed)
[facility] EMERGENCY DEPARTMENT Provider Note   CSN: [id number] Arrival date & time: 06/05/18  8295     History   Chief Complaint Chief Complaint  Patient presents with  . Back Pain    multiple complaints    HPI Monique Nguyen is a 47 y.o. female.  Patient is a 47 year old female who presents to the emergency department with a complaint of body aches and back pain, accompanied by "multiple complaints".  Patient has a history of alcohol abuse, anxiety, chronic abdominal pain, chronic pain syndrome, irritable bowel syndrome, pancreatitis, and panic attacks.  The patient states that her back pain is chronic.  She says it is related to her kidney, and that she has chronic kidney disease.  She says it seems to be worse in the last 3 or 4 days.  Patient states she has been having problems with her back and with her kidney since childhood.  The patient complains of body aches that she thinks may be related to chronic pancreatitis.  The patient states that she is supposed to be seen at the [facility] clinic.  She says she is supposed to be seen at this facility and then sent to [facility].  Patient states she has been nauseated.  She does not admit to diarrhea.  She is unsure if she has had temperature elevations or not.  The patient states that she has a sensation that her body is on fire most of the time, but it is worse today than it has been in the past.  Patient states that she drank 2 cans of beer this morning.  She complains of feeling tired and lightheaded.  She says that she needs to be admitted to intensive care because of her multiple medical situations.  The history is provided by the patient.  Back Pain   Associated symptoms include abdominal pain. Pertinent negatives include no chest pain, no fever and no dysuria.    Past Medical History:  Diagnosis Date  . Alcohol abuse   . Anxiety   . Bacterial vaginosis   . Chronic abdominal pain   .  Chronic pain   . Depression   . GERD (gastroesophageal reflux disease)   . IBS (irritable bowel syndrome)   . Pancreatitis    ETOH  . Panic attacks   . Psoriasis     Patient Active Problem List   Diagnosis Date Noted  . 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 Facial cellulitis 09/10/2012  . Insect bite 09/10/2012  . Depression 09/10/2012  . Anxiety 09/10/2012  . Hypokalemia 09/10/2012  . Tobacco abuse 09/10/2012  . Alcohol dependence (HCC) 06/28/2012  . Benzodiazepine abuse, continuous (HCC) 06/28/2012  . Suicide threat or attempt 06/28/2012    Class: Acute  . 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 Diarrhea 11/11/2010  . Nausea 11/11/2010  . Vaginal discharge 11/11/2010    Past Surgical History:  Procedure Laterality Date  . BLADDER SURGERY     X 3  . ESOPHAGOGASTRODUODENOSCOPY ENDOSCOPY    . EUS N/A 10/11/2012   Procedure: UPPER ENDOSCOPIC ULTRASOUND (EUS) LINEAR;  Surgeon: Rachael Fee, MD;  Location: [facility] ENDOSCOPY;  Service: Endoscopy;  Laterality: N/A;     OB History   No obstetric history on file.      Home Medications    Prior to Admission medications   Medication Sig Start Date End Date Taking? Authorizing Provider  Digestive Enzymes CAPS Take by mouth.  Yes [provider]  phenazopyridine (PYRIDIUM) 95 MG tablet Take 95 mg by mouth 3 (three) times daily as needed for pain.   Yes [provider]  PRESCRIPTION MEDICATION Take 1 tablet by mouth daily. Patient says someone gives her medicine for anxiety. She doesn't know what it's called and she doesn't ask.   Yes [provider]  Brewers Yeast (YEAST EXTRACT PO) Take 1 tablet by mouth daily.    [provider]  cephALEXin (KEFLEX) 500 MG capsule Take 1 capsule (500 mg total) by mouth 4 (four) times daily. For 7 days Patient not taking: Reported on 06/05/2018 08/04/17   Triplett, Tammy, PA-C  potassium chloride SA (K-DUR,KLOR-CON)  20 MEQ tablet Take 1 tablet (20 mEq total) by mouth 2 (two) times daily. Patient not taking: Reported on 06/05/2018 08/04/17   Pauline Ausriplett, Tammy, PA-C  promethazine (PHENERGAN) 25 MG tablet Take 1 tablet (25 mg total) by mouth every 6 (six) hours as needed for nausea or vomiting. Patient not taking: Reported on 06/05/2018 02/10/17   Bethann BerkshireZammit, Joseph, MD  traMADol (ULTRAM) 50 MG tablet Take 1 tablet (50 mg total) by mouth every 6 (six) hours as needed. Patient not taking: Reported on 06/05/2018 02/10/17   Bethann BerkshireZammit, Joseph, MD    Family History Family History  Problem Relation Age of Onset  . Depression Mother        living  . Pancreatic cancer Father        living  . Colon cancer Neg Hx   . Colon polyps Neg Hx     Social History Social History   Tobacco Use  . Smoking status: Current Every Day Smoker    Packs/day: 1.00    Years: 8.00    Pack years: 8.00    Types: Cigarettes  . Smokeless tobacco: Never Used  . Tobacco comment: Smokes one pack of cigarettes daily  Substance Use Topics  . Alcohol use: Yes    Alcohol/week: 3.0 standard drinks    Types: 3 Cans of beer per week    Comment: last drink 06/05/18  . Drug use: Yes    Types: Marijuana    Comment: last time used 1` month ago     Allergies   Doxycycline and Sulfa antibiotics   Review of Systems Review of Systems  Constitutional: Positive for appetite change and fatigue. Negative for activity change and fever.       All ROS Neg except as noted in HPI  HENT: Negative for nosebleeds.   Eyes: Negative for photophobia and discharge.  Respiratory: Negative for cough, shortness of breath and wheezing.   Cardiovascular: Negative for chest pain and palpitations.  Gastrointestinal: Positive for abdominal pain and nausea. Negative for blood in stool and diarrhea.  Genitourinary: Negative for dysuria, frequency and hematuria.  Musculoskeletal: Positive for back pain. Negative for arthralgias and neck pain.  Skin: Negative.     Neurological: Negative for dizziness, seizures and speech difficulty.  Psychiatric/Behavioral: Negative for confusion and hallucinations. The patient is nervous/anxious.      Physical Exam Updated Vital Signs Ht 5\' 1"  (1.549 m)   Wt 61.2 kg   LMP 04/12/2013 Comment: neg u pre  BMI 25.51 kg/m   Physical Exam Vitals signs and nursing note reviewed.  Constitutional:      Appearance: She is well-developed. She is not toxic-appearing.  HENT:     Head: Normocephalic.     Right Ear: Tympanic membrane and external ear normal.     Left Ear: Tympanic membrane  and external ear normal.  Eyes:     General: Lids are normal.     Pupils: Pupils are equal, round, and reactive to light.  Neck:     Musculoskeletal: Normal range of motion and neck supple. No neck rigidity.     Vascular: No carotid bruit.  Cardiovascular:     Rate and Rhythm: Regular rhythm. Tachycardia present.     Pulses: Normal pulses.     Heart sounds: Normal heart sounds. No friction rub.  Pulmonary:     Effort: No respiratory distress.     Breath sounds: Normal breath sounds. No stridor.  Abdominal:     General: Bowel sounds are normal.     Palpations: Abdomen is soft.     Tenderness: There is abdominal tenderness. There is no guarding.     Comments: Diffuse abd tenderness. Worse in the epigastric area.  Musculoskeletal: Normal range of motion.        General: No swelling.  Lymphadenopathy:     Head:     Right side of head: No submandibular adenopathy.     Left side of head: No submandibular adenopathy.     Cervical: No cervical adenopathy.  Skin:    General: Skin is warm and dry.     Findings: No rash.  Neurological:     Mental Status: She is alert.     Cranial Nerves: No cranial nerve deficit.     Sensory: No sensory deficit.     Motor: No weakness.     Coordination: Coordination normal.  Psychiatric:        Speech: Speech normal.      ED Treatments / Results  Labs (all labs ordered are listed, but  only abnormal results are displayed) Labs Reviewed - No data to display  EKG None  Radiology No results found.  Procedures Procedures (including critical care time)  Medications Ordered in ED Medications - No data to display   Initial Impression / Assessment and Plan / ED Course  I have reviewed the triage vital signs and the nursing notes.  Pertinent labs & imaging results that were available during my care of the patient were reviewed by me and considered in my medical decision making (see chart for details).      Final Clinical Impressions(s) / ED Diagnoses MDM  10:35 AM.  Informed by nursing staff that patient was trying to leave the facility.  Patient was crying and again saying that [facility]  With nurse present it was established that the patient is awake, alert, oriented to person place and situation.  Patient denies suicidal homicidal ideations.  I again explained to the patient the plan of her care at this time. She is in agreement.  The comprehensive metabolic panel shows the potassium to be slightly low at 3.3, otherwise within normal limits.  The anion gap is normal at 11.  The complete blood count is within normal limits.  The ethanol level was elevated at 50.  Called to the patient's room again.  The patient says she wants to leave AGAINST MEDICAL ADVICE .  With nursing staff present I discussed with the patient that some of her labs and return but not all of them and that without the completion of her work-up we would not be able to offer her the very best care.  I also discussed with her the possible dangers of leaving before medical care could be completed.  The  patient states that she frequently has problems with being anxious and she feels that she needs to leave.  She also says that she has level 1 at home that has autism that she needs to go check on.  She assumes full responsibility for her care and her outcome  at this time.  I offered the patient the opportunity to return at any time should she change her mind about her care.  The patient acknowledges understanding of my instructions and says that she will come back if she gets worse.  Patient is ambulatory in the room, as well as in the hall.  She is awake and alert, and fully capable of making decisions for herself at the time of leaving AGAINST MEDICAL ADVICE.   Final diagnoses:  Pain of multiple sites  Anxiety  Chronic pancreatitis, unspecified pancreatitis type [facility])    ED Discharge Orders    None       Ivery Quale, PA-C 06/05/18 1216    Samuel Jester, DO 06/06/18 [phone]

## 2018-06-05 NOTE — ED Notes (Signed)
Pt is requesting to remove IV and sign out AMA.

## 2018-06-06 LAB — URINE CULTURE: Culture: <10000 — AB

## 2018-07-28 ENCOUNTER — Encounter (HOSPITAL_COMMUNITY): Payer: Self-pay | Admitting: Emergency Medicine

## 2018-07-28 ENCOUNTER — Emergency Department (HOSPITAL_COMMUNITY)
Admission: EM | Admit: 2018-07-28 | Discharge: 2018-07-28 | Disposition: A | Discharge status: Home / Self Care | Payer: Self-pay | Attending: Emergency Medicine | Admitting: Emergency Medicine

## 2018-07-28 DIAGNOSIS — R112 Nausea with vomiting, unspecified: Secondary | ICD-10-CM | POA: Insufficient documentation

## 2018-07-28 DIAGNOSIS — F17210 Nicotine dependence, cigarettes, uncomplicated: Secondary | ICD-10-CM | POA: Insufficient documentation

## 2018-07-28 DIAGNOSIS — F1210 Cannabis abuse, uncomplicated: Secondary | ICD-10-CM | POA: Insufficient documentation

## 2018-07-28 DIAGNOSIS — R197 Diarrhea, unspecified: Secondary | ICD-10-CM | POA: Insufficient documentation

## 2018-07-28 DIAGNOSIS — N390 Urinary tract infection, site not specified: Secondary | ICD-10-CM | POA: Insufficient documentation

## 2018-07-28 DIAGNOSIS — K861 Other chronic pancreatitis: Secondary | ICD-10-CM | POA: Insufficient documentation

## 2018-07-28 DIAGNOSIS — Z5320 Procedure and treatment not carried out because of patient's decision for unspecified reasons: Secondary | ICD-10-CM | POA: Insufficient documentation

## 2018-07-28 DIAGNOSIS — R634 Abnormal weight loss: Secondary | ICD-10-CM | POA: Insufficient documentation

## 2018-07-28 DIAGNOSIS — K859 Acute pancreatitis without necrosis or infection, unspecified: Secondary | ICD-10-CM | POA: Insufficient documentation

## 2018-07-28 LAB — COMPREHENSIVE METABOLIC PANEL
ALT: 22 U/L (ref 0–44)
AST: 35 U/L (ref 15–41)
Albumin: 3.6 g/dL (ref 3.5–5.0)
Alkaline Phosphatase: 96 U/L (ref 38–126)
Anion gap: 12 (ref 5–15)
BUN: 19 mg/dL (ref 6–20)
CO2: 26 mmol/L (ref 22–32)
CREATININE: 0.65 mg/dL (ref 0.44–1.00)
Calcium: 8.5 mg/dL — ABNORMAL LOW (ref 8.9–10.3)
Chloride: 97 mmol/L — ABNORMAL LOW (ref 98–111)
GFR calc Af Amer: >60 mL/min (ref >60)
GFR calc non Af Amer: >60 mL/min (ref >60)
Glucose, Bld: 120 mg/dL — ABNORMAL HIGH (ref 70–99)
Potassium: 2.9 mmol/L — ABNORMAL LOW (ref 3.5–5.1)
SODIUM: 135 mmol/L (ref 135–145)
Total Bilirubin: 0.7 mg/dL (ref 0.3–1.2)
Total Protein: 7.2 g/dL (ref 6.5–8.1)

## 2018-07-28 LAB — CBC WITH DIFFERENTIAL/PLATELET
Abs Immature Granulocytes: 0.04 10*3/uL (ref 0.00–0.07)
Basophils Absolute: 0.1 10*3/uL (ref 0.0–0.1)
Basophils Relative: 1 %
EOS ABS: 0.7 10*3/uL — AB (ref 0.0–0.5)
EOS PCT: 6 %
HCT: 38.8 % (ref 36.0–46.0)
Hemoglobin: 12.4 g/dL (ref 12.0–15.0)
IMMATURE GRANULOCYTES: 0 %
Lymphocytes Relative: 14 %
Lymphs Abs: 1.5 10*3/uL (ref 0.7–4.0)
MCH: 32.9 pg (ref 26.0–34.0)
MCHC: 32 g/dL (ref 30.0–36.0)
MCV: 102.9 fL — ABNORMAL HIGH (ref 80.0–100.0)
Monocytes Absolute: 1.1 10*3/uL — ABNORMAL HIGH (ref 0.1–1.0)
Monocytes Relative: 10 %
NEUTROS PCT: 69 %
NRBC: 0 % (ref 0.0–0.2)
Neutro Abs: 7.3 10*3/uL (ref 1.7–7.7)
Platelets: 487 10*3/uL — ABNORMAL HIGH (ref 150–400)
RBC: 3.77 MIL/uL — ABNORMAL LOW (ref 3.87–5.11)
RDW: 13.5 % (ref 11.5–15.5)
WBC: 10.6 10*3/uL — ABNORMAL HIGH (ref 4.0–10.5)

## 2018-07-28 LAB — URINALYSIS, ROUTINE W REFLEX MICROSCOPIC
BILIRUBIN URINE: NEGATIVE
Glucose, UA: NEGATIVE mg/dL
KETONES UR: NEGATIVE mg/dL
Leukocytes,Ua: NEGATIVE
Nitrite: POSITIVE — AB
Protein, ur: NEGATIVE mg/dL
SPECIFIC GRAVITY, URINE: 1.012 (ref 1.005–1.030)
pH: 5 (ref 5.0–8.0)

## 2018-07-28 LAB — TSH: TSH: 2.709 u[IU]/mL (ref 0.350–4.500)

## 2018-07-28 LAB — LIPASE, BLOOD: Lipase: 198 U/L — ABNORMAL HIGH (ref 11–51)

## 2018-07-28 MED ORDER — CEPHALEXIN 500 MG PO CAPS: 500.0000 mg | ORAL_CAPSULE | Freq: Four times a day (QID) | ORAL | 0 refills | Status: DC

## 2018-07-28 MED ORDER — ONDANSETRON 4 MG PO TBDP: 4.0000 mg | ORAL_TABLET | Freq: Three times a day (TID) | ORAL | 0 refills | Status: DC | PRN

## 2018-07-28 MED ORDER — SODIUM CHLORIDE 0.9 % IV BOLUS
1000.0000 mL | Freq: Once | INTRAVENOUS | Status: AC
  Administered 2018-07-28: 1000 mL via INTRAVENOUS

## 2018-07-28 NOTE — ED Notes (Signed)
Pt walked out without discharge paperwork, VS obtained or signing.

## 2018-07-28 NOTE — ED Triage Notes (Signed)
Pt states she has been having pain all over for the past few days with diarrhea and some vomiting.  Has not been running a fever.

## 2018-07-28 NOTE — ED Notes (Signed)
Pt states she is pretty sure she has bone cancer based on things she has read in her chart.  Review of chart does not show any evidence of this and pt states she has never had treatment, but thought she read it somewhere from her last visit here.

## 2018-07-28 NOTE — ED Provider Notes (Signed)
[facility] EMERGENCY DEPARTMENT Provider Note   CSN: [id number] Arrival date & time: 07/28/18  1422     History   Chief Complaint Chief Complaint  Patient presents with  . Generalized Body Aches    HPI Monique MINSER is a 48 y.o. female.  HPI Patient presents with pain all over.  States she has had decreased oral intake over the last at least 2 months.  Nausea vomiting and diarrhea.  History of chronic pancreatitis.  No fevers.  No cough.  Pain is somewhat in her abdomen.  States she has been losing weight.  States she does not normally look herself in the mirror.  States however her psoriasis has improved.  Previously seen at [facility] but states she has not seen them recently.  States she thought she may have bone cancer.  States she thought she saw it in the discharge note from recent visit to the ER on Christmas Eve.  Pain is dull in her abdomen. Past Medical History:  Diagnosis Date  . Alcohol abuse   . Anxiety   . Bacterial vaginosis   . Chronic abdominal pain   . Chronic pain   . Depression   . GERD (gastroesophageal reflux disease)   . IBS (irritable bowel syndrome)   . Pancreatitis    ETOH  . Panic attacks   . Psoriasis     Patient Active Problem List   Diagnosis Date Noted  . 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 Facial cellulitis 09/10/2012  . Insect bite 09/10/2012  . Depression 09/10/2012  . Anxiety 09/10/2012  . Hypokalemia 09/10/2012  . Tobacco abuse 09/10/2012  . Alcohol dependence (HCC) 06/28/2012  . Benzodiazepine abuse, continuous (HCC) 06/28/2012  . Suicide threat or attempt 06/28/2012    Class: Acute  . 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 Diarrhea 11/11/2010  . Nausea 11/11/2010  . Vaginal discharge 11/11/2010    Past Surgical History:  Procedure Laterality Date  . BLADDER SURGERY     X 3  . ESOPHAGOGASTRODUODENOSCOPY ENDOSCOPY    . EUS N/A 10/11/2012   Procedure: UPPER ENDOSCOPIC  ULTRASOUND (EUS) LINEAR;  Surgeon: Rachael Fee, MD;  Location: [facility] END[facility];  Service: Endoscopy;  Laterality: N/A;     OB History   No obstetric history on file.      Home Medications    Prior to Admission medications   Medication Sig Start Date End Date Taking? Authorizing Provider  Brewers Yeast (YEAST EXTRACT PO) Take 1 tablet by mouth daily.    [provider]  cephALEXin (KEFLEX) 500 MG capsule Take 1 capsule (500 mg total) by mouth 4 (four) times daily. For 7 days 07/28/18   Benjiman Core, MD  Digestive Enzymes CAPS Take by mouth.    [provider]  ondansetron (ZOFRAN-ODT) 4 MG disintegrating tablet Take 1 tablet (4 mg total) by mouth every 8 (eight) hours as needed for nausea or vomiting. 07/28/18   Benjiman Core, MD  phenazopyridine (PYRIDIUM) 95 MG tablet Take 95 mg by mouth 3 (three) times daily as needed for pain.    [provider]  potassium chloride SA (K-DUR,KLOR-CON) 20 MEQ tablet Take 1 tablet (20 mEq total) by mouth 2 (two) times daily. Patient not taking: Reported on 06/05/2018 08/04/17   Pauline Aus, PA-C  PRESCRIPTION MEDICATION Take 1 tablet by mouth daily. Patient says someone gives her medicine for anxiety. She doesn't know what it's called and she doesn't ask.    [provider]  promethazine (PHENERGAN) 25 MG tablet Take 1 tablet (25 mg total) by mouth every 6 (six) hours as needed for nausea or vomiting. Patient not taking: Reported on 06/05/2018 02/10/17   Bethann BerkshireZammit, Joseph, MD  traMADol (ULTRAM) 50 MG tablet Take 1 tablet (50 mg total) by mouth every 6 (six) hours as needed. Patient not taking: Reported on 06/05/2018 02/10/17   Bethann BerkshireZammit, Joseph, MD    Family History Family History  Problem Relation Age of Onset  . Depression Mother        living  . Pancreatic cancer Father        living  . Colon cancer Neg Hx   . Colon polyps Neg Hx     Social History Social History   Tobacco Use  . Smoking status:  Current Every Day Smoker    Packs/day: 1.00    Years: 8.00    Pack years: 8.00    Types: Cigarettes  . Smokeless tobacco: Never Used  . Tobacco comment: Smokes one pack of cigarettes daily  Substance Use Topics  . Alcohol use: Yes    Alcohol/week: 3.0 standard drinks    Types: 3 Cans of beer per week    Comment: last drink 06/05/18  . Drug use: Yes    Types: Marijuana    Comment: last time used 1` month ago     Allergies   Doxycycline and Sulfa antibiotics   Review of Systems Review of Systems  Constitutional: Positive for appetite change, fatigue and unexpected weight change.  HENT: Negative for congestion.   Respiratory: Negative for shortness of breath.   Gastrointestinal: Positive for abdominal pain, diarrhea, nausea and vomiting.  Genitourinary: Negative for flank pain.  Musculoskeletal: Negative for back pain.  Skin: Negative for rash.  Neurological: Positive for weakness.  Psychiatric/Behavioral: Negative for confusion.     Physical Exam Updated Vital Signs BP (!) 126/91   Pulse (!) 106   Temp 98.7 F (37.1 C) (Temporal)   Resp 14   Ht 5\' 1"  (1.549 m)   Wt 59.9 kg   LMP 04/12/2013   SpO2 99%   BMI 24.94 kg/m   Physical Exam Constitutional:      Appearance: She is ill-appearing.     Comments: Patient appears chronically ill.  HENT:     Head: Normocephalic.     Mouth/Throat:     Pharynx: No oropharyngeal exudate.  Eyes:     Pupils: Pupils are equal, round, and reactive to light.  Cardiovascular:     Comments: Tachycardia Pulmonary:     Effort: No respiratory distress.     Breath sounds: No stridor. No rhonchi.  Abdominal:     Comments: Mild upper abdominal tenderness without rebound or guarding.  Musculoskeletal:     Right lower leg: No edema.     Left lower leg: No edema.  Skin:    General: Skin is warm.     Capillary Refill: Capillary refill takes less than 2 seconds.     Coloration: Skin is not jaundiced.  Neurological:     General:  No focal deficit present.  Psychiatric:        Mood and Affect: Mood normal.      ED Treatments / Results  Labs (all labs ordered are listed, but only abnormal results are displayed) Labs Reviewed  COMPREHENSIVE METABOLIC PANEL - Abnormal; Notable for the following components:      Result Value   Potassium 2.9 (*)    Chloride 97 (*)  Glucose, Bld 120 (*)    Calcium 8.5 (*)    All other components within normal limits  CBC WITH DIFFERENTIAL/PLATELET - Abnormal; Notable for the following components:   WBC 10.6 (*)    RBC 3.77 (*)    MCV 102.9 (*)    Platelets 487 (*)    Monocytes Absolute 1.1 (*)    Eosinophils Absolute 0.7 (*)    All other components within normal limits  LIPASE, BLOOD - Abnormal; Notable for the following components:   Lipase 198 (*)    All other components within normal limits  URINALYSIS, ROUTINE W REFLEX MICROSCOPIC - Abnormal; Notable for the following components:   Color, Urine AMBER (*)    APPearance HAZY (*)    Hgb urine dipstick SMALL (*)    Nitrite POSITIVE (*)    Bacteria, UA MANY (*)    All other components within normal limits  URINE CULTURE  TSH    EKG None  Radiology No results found.  Procedures Procedures (including critical care time)  Medications Ordered in ED Medications  sodium chloride 0.9 % bolus 1,000 mL (1,000 mLs Intravenous New Bag/Given 07/28/18 1706)     Initial Impression / Assessment and Plan / ED Course  I have reviewed the triage vital signs and the nursing notes.  Pertinent labs & imaging results that were available during my care of the patient were reviewed by me and considered in my medical decision making (see chart for details).  Patient is chronically ill.  States she is really not eaten anything the last few months.  Has UTI here and what appears to be a pancreatitis.  Has had some fluids and heart rates come down some.  I think patient benefit from admission to the hospital, however patient will not  stay.  States she does not want to come in.  Will treat with antibiotics orally.  Give antiemetics.  Needs to follow-up with PCP as soon as possible.  Discharge AMA.  Final Clinical Impressions(s) / ED Diagnoses   Final diagnoses:  Urinary tract infection without hematuria, site unspecified  Acute on chronic pancreatitis [facility])    ED Discharge Orders         Ordered    cephALEXin (KEFLEX) 500 MG capsule  4 times daily     07/28/18 1747    ondansetron (ZOFRAN-ODT) 4 MG disintegrating tablet  Every 8 hours PRN     07/28/18 1747           Benjiman Core, MD 07/28/18 1752

## 2018-07-28 NOTE — ED Notes (Signed)
Pt not in waiting area 

## 2018-07-30 LAB — URINE CULTURE

## 2018-10-14 ENCOUNTER — Encounter (HOSPITAL_COMMUNITY): Payer: Self-pay | Admitting: Emergency Medicine

## 2018-10-14 ENCOUNTER — Emergency Department (HOSPITAL_COMMUNITY): Payer: Medicaid Other

## 2018-10-14 ENCOUNTER — Inpatient Hospital Stay (HOSPITAL_COMMUNITY): Payer: Medicaid Other

## 2018-10-14 ENCOUNTER — Inpatient Hospital Stay (HOSPITAL_COMMUNITY)
Admission: EM | Admit: 2018-10-14 | Discharge: 2018-10-23 | DRG: 438 | Disposition: A | Discharge status: Home Health | Payer: Medicaid Other | Attending: Internal Medicine | Admitting: Internal Medicine

## 2018-10-14 ENCOUNTER — Other Ambulatory Visit: Payer: Self-pay

## 2018-10-14 DIAGNOSIS — L409 Psoriasis, unspecified: Secondary | ICD-10-CM | POA: Diagnosis present

## 2018-10-14 DIAGNOSIS — R64 Cachexia: Secondary | ICD-10-CM | POA: Diagnosis present

## 2018-10-14 DIAGNOSIS — R627 Adult failure to thrive: Secondary | ICD-10-CM | POA: Diagnosis present

## 2018-10-14 DIAGNOSIS — F17210 Nicotine dependence, cigarettes, uncomplicated: Secondary | ICD-10-CM | POA: Diagnosis present

## 2018-10-14 DIAGNOSIS — F1010 Alcohol abuse, uncomplicated: Secondary | ICD-10-CM | POA: Diagnosis present

## 2018-10-14 DIAGNOSIS — Z20828 Contact with and (suspected) exposure to other viral communicable diseases: Secondary | ICD-10-CM | POA: Diagnosis present

## 2018-10-14 DIAGNOSIS — K860 Alcohol-induced chronic pancreatitis: Secondary | ICD-10-CM | POA: Diagnosis present

## 2018-10-14 DIAGNOSIS — R911 Solitary pulmonary nodule: Secondary | ICD-10-CM | POA: Diagnosis present

## 2018-10-14 DIAGNOSIS — Z818 Family history of other mental and behavioral disorders: Secondary | ICD-10-CM

## 2018-10-14 DIAGNOSIS — Z881 Allergy status to other antibiotic agents status: Secondary | ICD-10-CM

## 2018-10-14 DIAGNOSIS — K859 Acute pancreatitis without necrosis or infection, unspecified: Secondary | ICD-10-CM

## 2018-10-14 DIAGNOSIS — R1310 Dysphagia, unspecified: Secondary | ICD-10-CM

## 2018-10-14 DIAGNOSIS — K219 Gastro-esophageal reflux disease without esophagitis: Secondary | ICD-10-CM | POA: Diagnosis present

## 2018-10-14 DIAGNOSIS — G8929 Other chronic pain: Secondary | ICD-10-CM | POA: Diagnosis present

## 2018-10-14 DIAGNOSIS — Z800 Family history of malignant neoplasm of digestive organs: Secondary | ICD-10-CM | POA: Diagnosis not present

## 2018-10-14 DIAGNOSIS — Z681 Body mass index (BMI) 19 or less, adult: Secondary | ICD-10-CM | POA: Diagnosis not present

## 2018-10-14 DIAGNOSIS — E876 Hypokalemia: Secondary | ICD-10-CM

## 2018-10-14 DIAGNOSIS — Z882 Allergy status to sulfonamides status: Secondary | ICD-10-CM | POA: Diagnosis not present

## 2018-10-14 DIAGNOSIS — R634 Abnormal weight loss: Secondary | ICD-10-CM

## 2018-10-14 DIAGNOSIS — Z23 Encounter for immunization: Secondary | ICD-10-CM | POA: Diagnosis not present

## 2018-10-14 DIAGNOSIS — E860 Dehydration: Secondary | ICD-10-CM

## 2018-10-14 DIAGNOSIS — K863 Pseudocyst of pancreas: Secondary | ICD-10-CM | POA: Diagnosis present

## 2018-10-14 DIAGNOSIS — F329 Major depressive disorder, single episode, unspecified: Secondary | ICD-10-CM | POA: Diagnosis present

## 2018-10-14 DIAGNOSIS — K852 Alcohol induced acute pancreatitis without necrosis or infection: Secondary | ICD-10-CM | POA: Diagnosis present

## 2018-10-14 DIAGNOSIS — D649 Anemia, unspecified: Secondary | ICD-10-CM | POA: Diagnosis present

## 2018-10-14 DIAGNOSIS — E43 Unspecified severe protein-calorie malnutrition: Secondary | ICD-10-CM

## 2018-10-14 DIAGNOSIS — R000 Tachycardia, unspecified: Secondary | ICD-10-CM | POA: Diagnosis present

## 2018-10-14 DIAGNOSIS — B379 Candidiasis, unspecified: Secondary | ICD-10-CM | POA: Diagnosis present

## 2018-10-14 DIAGNOSIS — F32A Depression, unspecified: Secondary | ICD-10-CM | POA: Diagnosis present

## 2018-10-14 DIAGNOSIS — K861 Other chronic pancreatitis: Secondary | ICD-10-CM

## 2018-10-14 LAB — CBC WITH DIFFERENTIAL/PLATELET
Abs Immature Granulocytes: 0.02 10*3/uL (ref 0.00–0.07)
Basophils Absolute: 0 10*3/uL (ref 0.0–0.1)
Basophils Relative: 0 %
Eosinophils Absolute: 0 10*3/uL (ref 0.0–0.5)
Eosinophils Relative: 0 %
HCT: 37.4 % (ref 36.0–46.0)
Hemoglobin: 13.4 g/dL (ref 12.0–15.0)
Immature Granulocytes: 0 %
Lymphocytes Relative: 7 %
Lymphs Abs: 0.5 10*3/uL — ABNORMAL LOW (ref 0.7–4.0)
MCH: 32.7 pg (ref 26.0–34.0)
MCHC: 35.8 g/dL (ref 30.0–36.0)
MCV: 91.2 fL (ref 80.0–100.0)
Monocytes Absolute: 0.7 10*3/uL (ref 0.1–1.0)
Monocytes Relative: 10 %
Neutro Abs: 5.8 10*3/uL (ref 1.7–7.7)
Neutrophils Relative %: 83 %
Platelets: 579 10*3/uL — ABNORMAL HIGH (ref 150–400)
RBC: 4.1 MIL/uL (ref 3.87–5.11)
RDW: 14.6 % (ref 11.5–15.5)
WBC: 7.1 10*3/uL (ref 4.0–10.5)
nRBC: 0 % (ref 0.0–0.2)

## 2018-10-14 LAB — URINALYSIS, ROUTINE W REFLEX MICROSCOPIC
Bacteria, UA: NONE SEEN
Bilirubin Urine: NEGATIVE
Glucose, UA: NEGATIVE mg/dL
Ketones, ur: 20 mg/dL — AB
Leukocytes,Ua: NEGATIVE
Nitrite: POSITIVE — AB
Protein, ur: 100 mg/dL — AB
Specific Gravity, Urine: 1.018 (ref 1.005–1.030)
pH: 6 (ref 5.0–8.0)

## 2018-10-14 LAB — COMPREHENSIVE METABOLIC PANEL
ALT: 33 U/L (ref 0–44)
AST: 59 U/L — ABNORMAL HIGH (ref 15–41)
Albumin: 4.1 g/dL (ref 3.5–5.0)
Alkaline Phosphatase: 154 U/L — ABNORMAL HIGH (ref 38–126)
Anion gap: 17 — ABNORMAL HIGH (ref 5–15)
BUN: 53 mg/dL — ABNORMAL HIGH (ref 6–20)
CO2: 19 mmol/L — ABNORMAL LOW (ref 22–32)
Calcium: 9.9 mg/dL (ref 8.9–10.3)
Chloride: 98 mmol/L (ref 98–111)
Creatinine, Ser: 0.73 mg/dL (ref 0.44–1.00)
GFR calc Af Amer: >60 mL/min (ref >60)
GFR calc non Af Amer: >60 mL/min (ref >60)
Glucose, Bld: 146 mg/dL — ABNORMAL HIGH (ref 70–99)
Potassium: 2.2 mmol/L — CL (ref 3.5–5.1)
Sodium: 134 mmol/L — ABNORMAL LOW (ref 135–145)
Total Bilirubin: 1.5 mg/dL — ABNORMAL HIGH (ref 0.3–1.2)
Total Protein: 8.5 g/dL — ABNORMAL HIGH (ref 6.5–8.1)

## 2018-10-14 LAB — ETHANOL: Alcohol, Ethyl (B): <10 mg/dL (ref <10)

## 2018-10-14 LAB — VITAMIN B12: Vitamin B-12: 654 pg/mL (ref 180–914)

## 2018-10-14 LAB — PHOSPHORUS: Phosphorus: 1.8 mg/dL — ABNORMAL LOW (ref 2.5–4.6)

## 2018-10-14 LAB — SARS CORONAVIRUS 2 BY RT PCR (HOSPITAL ORDER, PERFORMED IN ~~LOC~~ HOSPITAL LAB): SARS Coronavirus 2: NEGATIVE

## 2018-10-14 LAB — MAGNESIUM: Magnesium: 2.5 mg/dL — ABNORMAL HIGH (ref 1.7–2.4)

## 2018-10-14 LAB — TSH: TSH: 0.978 u[IU]/mL (ref 0.350–4.500)

## 2018-10-14 LAB — LIPASE, BLOOD: Lipase: 76 U/L — ABNORMAL HIGH (ref 11–51)

## 2018-10-14 MED ORDER — POTASSIUM CHLORIDE 10 MEQ/100ML IV SOLN
10.0000 meq | Freq: Once | INTRAVENOUS | Status: AC
  Administered 2018-10-14: 10:00:00 10 meq via INTRAVENOUS
  Filled 2018-10-14: qty 100

## 2018-10-14 MED ORDER — PANTOPRAZOLE SODIUM 40 MG IV SOLR
40.0000 mg | INTRAVENOUS | Status: DC
  Administered 2018-10-14: 19:00:00 40 mg via INTRAVENOUS
  Filled 2018-10-14: qty 40

## 2018-10-14 MED ORDER — POTASSIUM CHLORIDE 10 MEQ/100ML IV SOLN
10.0000 meq | INTRAVENOUS | Status: AC
  Administered 2018-10-14 (×2): 10 meq via INTRAVENOUS
  Filled 2018-10-14 (×2): qty 100

## 2018-10-14 MED ORDER — FOLIC ACID 5 MG/ML IJ SOLN
1.0000 mg | Freq: Every day | INTRAMUSCULAR | Status: DC
  Administered 2018-10-14 – 2018-10-23 (×9): 1 mg via INTRAVENOUS
  Filled 2018-10-14 (×12): qty 0.2

## 2018-10-14 MED ORDER — IOHEXOL 300 MG/ML  SOLN
30.0000 mL | Freq: Once | INTRAMUSCULAR | Status: AC | PRN
  Administered 2018-10-14: 12:00:00 30 mL via ORAL

## 2018-10-14 MED ORDER — ONDANSETRON HCL 4 MG/2ML IJ SOLN: 4.0000 mg | Freq: Four times a day (QID) | INTRAMUSCULAR | Status: DC | PRN

## 2018-10-14 MED ORDER — PANTOPRAZOLE SODIUM 40 MG PO TBEC
40.0000 mg | DELAYED_RELEASE_TABLET | Freq: Every day | ORAL | Status: DC
  Administered 2018-10-14: 14:00:00 40 mg via ORAL
  Filled 2018-10-14: qty 1

## 2018-10-14 MED ORDER — PNEUMOCOCCAL VAC POLYVALENT 25 MCG/0.5ML IJ INJ
0.5000 mL | INJECTION | INTRAMUSCULAR | Status: DC
  Filled 2018-10-14: qty 0.5

## 2018-10-14 MED ORDER — KCL IN DEXTROSE-NACL 40-5-0.9 MEQ/L-%-% IV SOLN
INTRAVENOUS | Status: DC
  Administered 2018-10-14 – 2018-10-18 (×9): via INTRAVENOUS
  Filled 2018-10-14 (×3): qty 1000

## 2018-10-14 MED ORDER — SODIUM CHLORIDE 0.9 % IV BOLUS
1000.0000 mL | Freq: Once | INTRAVENOUS | Status: AC
  Administered 2018-10-14: 08:00:00 1000 mL via INTRAVENOUS

## 2018-10-14 MED ORDER — BOOST / RESOURCE BREEZE PO LIQD CUSTOM
1.0000 | Freq: Three times a day (TID) | ORAL | Status: DC
  Administered 2018-10-14 – 2018-10-21 (×19): 1 via ORAL
  Administered 2018-10-21: 09:00:00 237 mL via ORAL
  Filled 2018-10-14 (×5): qty 1

## 2018-10-14 MED ORDER — THIAMINE HCL 100 MG/ML IJ SOLN
100.0000 mg | Freq: Every day | INTRAMUSCULAR | Status: DC
  Administered 2018-10-14 – 2018-10-23 (×9): 100 mg via INTRAVENOUS
  Filled 2018-10-14 (×10): qty 2

## 2018-10-14 MED ORDER — PROCHLORPERAZINE EDISYLATE 10 MG/2ML IJ SOLN
10.0000 mg | Freq: Four times a day (QID) | INTRAMUSCULAR | Status: DC | PRN
  Administered 2018-10-17 – 2018-10-22 (×4): 10 mg via INTRAVENOUS
  Filled 2018-10-14 (×4): qty 2

## 2018-10-14 MED ORDER — HEPARIN SODIUM (PORCINE) 5000 UNIT/ML IJ SOLN
5000.0000 [IU] | Freq: Three times a day (TID) | INTRAMUSCULAR | Status: DC
  Administered 2018-10-14 – 2018-10-16 (×6): 5000 [IU] via SUBCUTANEOUS
  Filled 2018-10-14 (×6): qty 1

## 2018-10-14 MED ORDER — PANCRELIPASE (LIP-PROT-AMYL) 12000-38000 UNITS PO CPEP
36000.0000 [IU] | ORAL_CAPSULE | Freq: Three times a day (TID) | ORAL | Status: DC
  Administered 2018-10-14 – 2018-10-15 (×2): 36000 [IU] via ORAL
  Filled 2018-10-14 (×4): qty 1
  Filled 2018-10-14 (×3): qty 3

## 2018-10-14 MED ORDER — NICOTINE 21 MG/24HR TD PT24
21.0000 mg | MEDICATED_PATCH | Freq: Every day | TRANSDERMAL | Status: DC
  Administered 2018-10-14 – 2018-10-23 (×8): 21 mg via TRANSDERMAL
  Filled 2018-10-14 (×11): qty 1

## 2018-10-14 MED ORDER — MORPHINE SULFATE (PF) 2 MG/ML IV SOLN
2.0000 mg | INTRAVENOUS | Status: DC | PRN
  Administered 2018-10-14 – 2018-10-23 (×33): 2 mg via INTRAVENOUS
  Filled 2018-10-14 (×33): qty 1

## 2018-10-14 MED ORDER — IOHEXOL 300 MG/ML  SOLN
75.0000 mL | Freq: Once | INTRAMUSCULAR | Status: AC | PRN
  Administered 2018-10-14: 12:00:00 75 mL via INTRAVENOUS

## 2018-10-14 NOTE — ED Triage Notes (Signed)
The patient states that she is having trouble eating drinking walking she states that she is wasting away. This has been going on for the past 5-6 years but worse lately

## 2018-10-14 NOTE — ED Notes (Signed)
Date and time results received: 10/14/18 0910 (use smartphrase ".now" to insert current time)  Test: k+ Critical Value: 2.2  Name of Provider Notified: pickering  Orders Received? Or Actions Taken?: see chart

## 2018-10-14 NOTE — H&P (Signed)
History and Physical    [name]:[id number] DOB: 03/04/1971 DOA: 10/14/2018  Referring MD/NP/PA: Dr. Rubin Payor PCP: Patient, No Pcp Per  Patient coming from: Home  Chief Complaint: Abdominal pain, weight loss, nausea, vomiting; sensation of food sticking in her chest.  HPI: Monique Nguyen is a 48 y.o. female with past medical history significant for tobacco abuse, depression and/anxiety, gastroesophageal reflux disease, history of alcoholic pancreatitis and psoriasis; who presented to the hospital secondary to increased abdominal pain, nausea, vomiting and inability to eat.  Patient reports that for the last month she has no being able to eat essentially anything (including having trouble even with liquids).  She has no significant amount of weight and expressed increased abdominal pain.  Pain is localized in her epigastric area/mid section, radiated to the left side of back, 10 out of 10 in intensity, no alleviating factors, associated with anorexia, nausea and intermittent episodes of vomiting (there has not been any blood seen in the vomit content). Patient denies any fever, chills, chest pain, shortness of breath, hematuria, dysuria, melena, hematochezia, focal weakness or any other complaints.  There has not been any sick contacts or recent travels.  In the ED lipase found to be elevated, patient with clinical findings of dehydration (decreased skin turgor, increased dry mucous membranes and tachycardia), hypokalemia with a potassium of 2.2 and a CT scan of the abdomen demonstrating acute on chronic pancreatitis with new development of what appears to be pseudocysts.  [facility] has been called to admit patient for further evaluation and management.  Of note, patient reported sensation of food sticking in her chest impairing her for good nutrition and hydration.  She is currently not taking any medications, reports significant In her smoking habits and been alcohol free for multiple  months prior to this admission.  Past Medical/Surgical History: Past Medical History:  Diagnosis Date  . Alcohol abuse   . Anxiety   . Bacterial vaginosis   . Chronic abdominal pain   . Chronic pain   . Depression   . GERD (gastroesophageal reflux disease)   . IBS (irritable bowel syndrome)   . Pancreatitis    ETOH  . Panic attacks   . Psoriasis     Past Surgical History:  Procedure Laterality Date  . BLADDER SURGERY     X 3  . ESOPHAGOGASTRODUODENOSCOPY ENDOSCOPY    . EUS N/A 10/11/2012   Procedure: UPPER ENDOSCOPIC ULTRASOUND (EUS) LINEAR;  Surgeon: Rachael Fee, MD;  Location: [facility];  Service: Endoscopy;  Laterality: N/A;    Social History:  reports that she has been smoking cigarettes. She has a 8.00 pack-year smoking history. She has never used smokeless tobacco. She reports being alcohol free for multiple months now.  She reports previous drug use. Drug: Marijuana.  Allergies: Allergies  Allergen Reactions  . Doxycycline Other (See Comments)    headaches  . Sulfa Antibiotics Nausea And Vomiting and Other (See Comments)    Blurred vision and headaches    Family History:  Family History  Problem Relation Age of Onset  . Depression Mother        living  . Pancreatic cancer Father        living  . Colon cancer Neg Hx   . Colon polyps Neg Hx     Prior to Admission medications   Not on File    Review of Systems:  Negative except as otherwise mentioned in HPI.   Physical Exam: Vitals:   10/14/18 1000  10/14/18 1015 10/14/18 1030 10/14/18 1231  BP: 98/86  132/82 132/82  Pulse: (!) 102 (!) 113 (!) 107 (!) 105  Resp: (!) Temp:    98.2 F (36.8 C)  TempSrc:    Oral  SpO2: 100% 91% 100% 100%  Weight:      Height:        Constitutional: Moderate distress secondary to abdominal pain, and nausea.  Currently no vomiting.  Patient is afebrile, denies chest pain, shortness of breath, palpitations.  Chronically ill in appearance and  cachectic on examination. Eyes: PERRL, lids and conjunctivae normal, no icterus, no nystagmus. ENMT: Mucous membranes are significantly dry on exam. Posterior pharynx clear of any exudate; mild erythema appreciated. Poor dentition. Neck: normal, supple, no masses, no thyromegaly Respiratory: clear to auscultation bilaterally, no wheezing, no crackles. Normal respiratory effort. No accessory muscle use.  Cardiovascular: Sinus tachycardia, no murmurs, no gallops, no rubs, no JVD. No extremity edema. 2+ pedal pulses. No carotid bruits.  Abdomen: Tenderness to palpation appreciated in epigastric/mid abdominal area extending to her left quadrant; positive bowel sounds, positive guarding; no masses. Musculoskeletal: no clubbing / cyanosis. No joint deformity upper and lower extremities. Good ROM, no contractures. Normal muscle tone.  Skin: no rashes, lesions, ulcers. No induration Neurologic: CN 2-12 grossly intact. Sensation intact, DTR normal. Strength 5/5 in all 4.  Psychiatric: Normal judgment and insight. Alert and oriented x 3. Normal mood.    Labs on Admission: I have personally reviewed the following labs and imaging studies  CBC: Recent Labs  Lab 10/14/18 0818  WBC 7.1  NEUTROABS 5.8  HGB 13.4  HCT 37.4  MCV 91.2  PLT 579*   Basic Metabolic Panel: Recent Labs  Lab 10/14/18 0818 10/14/18 1033  NA 134*  --   K 2.2*  --   CL 98  --   CO2 19*  --   GLUCOSE 146*  --   BUN 53*  --   CREATININE 0.73  --   CALCIUM 9.9  --   MG 2.5*  --   PHOS  --  1.8*   GFR: Estimated Creatinine Clearance: 50.4 mL/min (by C-G formula based on SCr of 0.73 mg/dL).   Liver Function Tests: Recent Labs  Lab 10/14/18 0818  AST 59*  ALT 33  ALKPHOS 154*  BILITOT 1.5*  PROT 8.5*  ALBUMIN 4.1   Recent Labs  Lab 10/14/18 0818  LIPASE 76*   Thyroid Function Tests: Recent Labs    10/14/18 0818  TSH 0.978   Anemia Panel: Recent Labs    10/14/18 1032  VITAMINB12 654   Urine  analysis:    Component Value Date/Time   COLORURINE AMBER (A) 10/14/2018 0810   APPEARANCEUR CLEAR 10/14/2018 0810   LABSPEC 1.018 10/14/2018 0810   PHURINE 6.0 10/14/2018 0810   GLUCOSEU NEGATIVE 10/14/2018 0810   HGBUR SMALL (A) 10/14/2018 0810   BILIRUBINUR NEGATIVE 10/14/2018 0810   KETONESUR 20 (A) 10/14/2018 0810   PROTEINUR 100 (A) 10/14/2018 0810   UROBILINOGEN 0.2 03/06/2015 1558   NITRITE POSITIVE (A) 10/14/2018 0810   LEUKOCYTESUR NEGATIVE 10/14/2018 0810    Recent Results (from the past 240 hour(s))  SARS Coronavirus 2 (CEPHEID - Performed in [facility] hospital lab), Hosp Order     Status: None   Collection Time: 10/14/18  8:11 AM  Result Value Ref Range Status   SARS Coronavirus 2 NEGATIVE NEGATIVE Final    Comment: (NOTE) If result is NEGATIVE SARS-CoV-2 target  nucleic acids are NOT DETECTED. The SARS-CoV-2 RNA is generally detectable in upper and lower  respiratory specimens during the acute phase of infection. The lowest  concentration of SARS-CoV-2 viral copies this assay can detect is 250  copies / mL. A negative result does not preclude SARS-CoV-2 infection  and should not be used as the sole basis for treatment or other  patient management decisions.  A negative result may occur with  improper specimen collection / handling, submission of specimen other  than nasopharyngeal swab, presence of viral mutation(s) within the  areas targeted by this assay, and inadequate number of viral copies  (<250 copies / mL). A negative result must be combined with clinical  observations, patient history, and epidemiological information. If result is POSITIVE SARS-CoV-2 target nucleic acids are DETECTED. The SARS-CoV-2 RNA is generally detectable in upper and lower  respiratory specimens dur ing the acute phase of infection.  Positive  results are indicative of active infection with SARS-CoV-2.  Clinical  correlation with patient history and other diagnostic information  is  necessary to determine patient infection status.  Positive results do  not rule out bacterial infection or co-infection with other viruses. If result is PRESUMPTIVE POSTIVE SARS-CoV-2 nucleic acids MAY BE PRESENT.   A presumptive positive result was obtained on the submitted specimen  and confirmed on repeat testing.  While 2019 novel coronavirus  (SARS-CoV-2) nucleic acids may be present in the submitted sample  additional confirmatory testing may be necessary for epidemiological  and / or clinical management purposes  to differentiate between  SARS-CoV-2 and other Sarbecovirus currently known to infect humans.  If clinically indicated additional testing with an alternate test  methodology [phone]([id number]) is advised. The SARS-CoV-2 RNA is generally  detectable in upper and lower respiratory sp ecimens during the acute  phase of infection. The expected result is Negative. Fact Sheet for Patients:  BoilerBrush.com.cyhttps://www.fda.gov/media/136312/download Fact Sheet for Healthcare Providers: https://pope.com/https://www.fda.gov/media/136313/download This test is not yet approved or cleared by the [place] FDA and has been authorized for detection and/or diagnosis of SARS-CoV-2 by FDA under an Emergency Use Authorization (EUA).  This EUA will remain in effect (meaning this test can be used) for the duration of the COVID-19 declaration under Section 564(b)(1) of the Act, 21 U.S.C. section 360bbb-3(b)(1), unless the authorization is terminated or revoked sooner. Performed at [facility], [address] [place], [place] [address]      Radiological Exams on Admission: Ct Abdomen Pelvis W Contrast  Result Date: 10/14/2018 CLINICAL DATA:  Inpatient. Failure to thrive. Chronic abdominal pain. Difficulty swallowing with sensation of food sticking in throat. Weight loss. History of pancreatitis. EXAM: CT ABDOMEN AND PELVIS WITH CONTRAST TECHNIQUE: Multidetector CT imaging of the abdomen and pelvis was performed using the  standard protocol following bolus administration of intravenous contrast. CONTRAST:  30mL OMNIPAQUE IOHEXOL 300 MG/ML SOLN, 75mL OMNIPAQUE IOHEXOL 300 MG/ML SOLN COMPARISON:  10/14/2018 abdominal radiographs. 08/04/2017 CT abdomen/pelvis. FINDINGS: Motion degraded scan, partially limiting assessment. Lower chest: Right middle lobe 2 mm solid pulmonary nodule (series 5/image 1), not previously imaged. Additional tiny 2 mm pulmonary nodules at both lung bases are unchanged from 2017 CT, considered benign. Irregular thick walled 2.5 x 1.7 x 3.2 cm fluid collection along the anterior margin of the lower thoracic esophagus (series 2/image 11), new since 08/04/2017 CT. Similar irregular thick walled curvilinear fluid collection posterior to the esophagogastric junction extending into the right retrocrural region measuring 3.2 x 1.5 x 4.1 cm (series 2/image 17), new.  Hepatobiliary: Normal liver size. No liver mass. Normal gallbladder with no radiopaque cholelithiasis. No biliary ductal dilatation. CBD diameter 5 mm. Pancreas: Chronic atrophy of distal body and tail of the pancreas with associated pancreatic duct dilation in these pancreatic segments, compatible with chronic pancreatitis. New thick-walled 4.1 x 2.9 x 3.5 cm pancreatic head cystic lesion (series 2/image 26). Posterior pancreatic head 1.0 x 0.8 cm cystic lesion (series 2/image 28), mildly increased from 0.7 x 0.6 cm. New clustered cystic pancreatic lesions in the pancreatic body, largest 2.4 x 1.7 cm anteriorly (series 2/image 21). No significant acute peripancreatic fat stranding. Spleen: Normal size. No mass. Adrenals/Urinary Tract: Normal adrenals. Simple 1.1 cm upper left renal cyst. Otherwise normal kidneys, with no hydronephrosis. Chronic mild diffuse bladder wall thickening with moderate bladder distention. Stomach/Bowel: Mild wall thickening throughout the esophagogastric junction. Irregular thick walled fluid collection along the posterior margin of  the antropyloric stomach measuring approximately 3.7 x 2.0 x 5.1 cm (series 2/image 31), new, exerting mild extrinsic mass-effect on the distal stomach. Stomach is not significantly distended. Normal caliber small bowel with no small bowel wall thickening. Oral contrast transits to the distal small bowel. Normal appendix. Normal large bowel with no diverticulosis, large bowel wall thickening or pericolonic fat stranding. Vascular/Lymphatic: Atherosclerotic nonaneurysmal abdominal aorta. Patent hepatic, portal and renal veins. Diminutive splenic vein, which remains patent, unchanged. No pathologically enlarged lymph nodes in the abdomen or pelvis. New ill-defined curvilinear soft tissue in the central mesentery, which appears to communicate with the posterior distal gastric fluid collection. Reproductive: Grossly normal uterus. No discrete adnexal mass. Small volume hemoperitoneum in the pelvic cul-de-sac. Other: No pneumoperitoneum.  No abdominal ascites. Musculoskeletal: No aggressive appearing focal osseous lesions. IMPRESSION: 1. 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 Redemonstrated findings of chronic pancreatitis in the distal body and tail of the pancreas. 2. New irregular thick walled fluid collections encasing the esophagogastric junction anteriorly and posteriorly with associated wall thickening throughout the esophagogastric junction. Similar new irregular thick walled fluid collection along the posterior margin of the antropyloric stomach with some mass effect on the distal stomach, which is not significantly distended. These nonspecific findings also presumably represent pancreatic pseudocysts with reactive wall thickening at the esophagogastric junction and distal stomach. Infected collections cannot be excluded. 3. Close CT or MRI abdomen follow-up imaging advised. 4. No biliary ductal dilatation.   No radiopaque cholelithiasis. 5. Nonspecific small volume hemoperitoneum in the pelvic cul-de-sac. No discrete adnexal mass. Suggest follow-up pelvic ultrasound on a short-term basis when clinically feasible. 6. Nonspecific chronic diffuse bladder wall thickening. Moderate bladder distention. No hydronephrosis. 7. Right middle lobe 2 mm solid pulmonary nodule, not previously imaged. No follow-up needed if patient is low-risk. Non-contrast chest CT can be considered in 12 months if patient is high-risk. This recommendation follows the consensus statement: Guidelines for Management of Incidental Pulmonary Nodules Detected on CT Images:From the Fleischner Society 2017; published online before print (10.1148/[id number]). 8.  Aortic Atherosclerosis (ICD10-I70.0). Electronically Signed   By: Delbert Phenix M.D.   On: 10/14/2018 12:49   Dg Abdomen Acute W/chest  Result Date: 10/14/2018 CLINICAL DATA:  Vomiting EXAM: DG ABDOMEN ACUTE W/ 1V CHEST COMPARISON:  08/04/2017 CT abdomen/pelvis. 10/09/2014 chest radiograph. FINDINGS: Stable cardiomediastinal silhouette with normal heart size. No pneumothorax. No pleural effusion. Lungs appear clear, with no acute consolidative airspace disease and no pulmonary edema. No dilated small bowel loops or air-fluid levels. Moderate gas  and mild stool throughout the colon. No evidence of pneumatosis or pneumoperitoneum. No radiopaque nephrolithiasis. IMPRESSION: 1. No active cardiopulmonary disease. 2. Nonobstructive bowel gas pattern. Moderate gas and mild stool in the colon. Electronically Signed   By: Delbert Phenix M.D.   On: 10/14/2018 09:25    EKG: Independently reviewed. Sinus Tachycardia with prolonged QT.  Assessment/Plan 1-acute on chronic pancreatitis [facility]): In the setting of prior hx of alcoholic pancreatitis. -Lipase elevated -CT scan of the abdomen and pelvis demonstrated new pseudocyst formation and changes consistent with acute on chronic pancreatitis  inflammatory process. -Clear liquid for bowel rest -Fluid resuscitation, electrolyte repletion, antiemetics and analgesics. -GI service has been consulted -Follow clinical response.  2-severe hypokalemia -Due to GI losses and poor oral intake -Magnesium within normal limits -Provide potassium repletion and follow electrolytes trend.  3-Depression -Mood overall stable -Denies hallucinations or suicidal ideation currently -Was not using any antidepressant prior to admission. -will Monitor for now.  4-severe protein calorie malnutrition adult failure to thrive -Dietitian service has been consulted -Patient is started on breeze -For now just clear liquids -Body mass index is 15.29 kg/m. -will be cautious and monitor phosphorus and Mg, given concerns for refeeding syndrome.   5-GERD (gastroesophageal reflux disease) -started on PPI  6-Dehydration  -will provide IVF's resuscitation  7-history of tobacco and alcohol abuse -Cessation counseling has been provided -Nicotine patch has been ordered -Patient reports multiple months without drinking alcohol -Will provide thiamine and folic acid.  There is no signs of withdrawal currently. -check B12 level.   8-sinus tachycardia/prolonged QT -Avoid medications that can further explain QT prolongation -Patient will be monitored on telemetry -Replete electrolytes -Repeat EKG in a.m.  9-abnormal UA: amber appearance, positive RBC's and positive nitrite  -patient denies dysuria -no fever and normal WBC's -hold on antibiotics for now -provide IVF's -check urine culture.  DVT prophylaxis: Heparin Code Status: Full code Family Communication: No family at bedside. Disposition Plan: To be determined; but hopefully medically stable for discharge on 10/17/2018 Consults called: Gastroenterology service Admission status: Inpatient, telemetry bed, length of stay more than 2 midnights.   Time Spent: 70 minutes  Vassie Loll MD [facility] Pager [phone]  10/14/2018, 5:12 PM

## 2018-10-14 NOTE — Plan of Care (Signed)
Progressing

## 2018-10-14 NOTE — ED Provider Notes (Signed)
[facility] EMERGENCY DEPARTMENT Provider Note   CSN: [id number] Arrival date & time: 10/14/18  4315    History   Chief Complaint Chief Complaint  Patient presents with  . Failure To Thrive    HPI Monique Nguyen is a 48 y.o. female.     HPI Patient presents with decreased oral intake.  States this been going on for months.  She was last seen in the ER 2-1/2 months ago and that time she is lost around 23 kg and now weighs 37 kg.  States whenever she eats or drinks the food will come back up.  States it feels of a gets caught in her chest.  States she does have pain in her abdomen she eats.  I saw her on the previous visit and had a pancreatitis and urinary tract infection but left without treatment.  States she has not really been able to eat and drink at all.  Has had all that she states no longer fits because she has been drinking so much.  States her partial dentures do not fit either.  States her mouth is white.  Denies alcohol use.  States she has quit smoking however.  States she has not really passing gas or having bowel movements.  Does have some decrease in urination. Past Medical History:  Diagnosis Date  . Alcohol abuse   . Anxiety   . Bacterial vaginosis   . Chronic abdominal pain   . Chronic pain   . Depression   . GERD (gastroesophageal reflux disease)   . IBS (irritable bowel syndrome)   . Pancreatitis    ETOH  . Panic attacks   . Psoriasis     Patient Active Problem List   Diagnosis Date Noted  . 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 Facial cellulitis 09/10/2012  . Insect bite 09/10/2012  . Depression 09/10/2012  . Anxiety 09/10/2012  . Hypokalemia 09/10/2012  . Tobacco abuse 09/10/2012  . Alcohol dependence (HCC) 06/28/2012  . Benzodiazepine abuse, continuous (HCC) 06/28/2012  . Suicide threat or attempt 06/28/2012    Class: Acute  . 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 Diarrhea 11/11/2010  . Nausea  11/11/2010  . Vaginal discharge 11/11/2010    Past Surgical History:  Procedure Laterality Date  . BLADDER SURGERY     X 3  . ESOPHAGOGASTRODUODENOSCOPY ENDOSCOPY    . EUS N/A 10/11/2012   Procedure: UPPER ENDOSCOPIC ULTRASOUND (EUS) LINEAR;  Surgeon: Rachael Fee, MD;  Location: [facility] ENDOSCOPY;  Service: Endoscopy;  Laterality: N/A;     OB History   No obstetric history on file.      Home Medications    Prior to Admission medications   Medication Sig Start Date End Date Taking? Authorizing Provider  cephALEXin (KEFLEX) 500 MG capsule Take 1 capsule (500 mg total) by mouth 4 (four) times daily. For 7 days Patient not taking: Reported on 10/14/2018 07/28/18   Benjiman Core, MD  ondansetron (ZOFRAN-ODT) 4 MG disintegrating tablet Take 1 tablet (4 mg total) by mouth every 8 (eight) hours as needed for nausea or vomiting. Patient not taking: Reported on 10/14/2018 07/28/18   Benjiman Core, MD    Family History Family History  Problem Relation Age of Onset  . Depression Mother        living  . Pancreatic cancer Father        living  . Colon cancer Neg Hx   . Colon polyps Neg Hx  Social History Social History   Tobacco Use  . Smoking status: Current Some Day Smoker    Packs/day: 1.00    Years: 8.00    Pack years: 8.00    Types: Cigarettes  . Smokeless tobacco: Never Used  . Tobacco comment: Smokes one pack of cigarettes daily  Substance Use Topics  . Alcohol use: Yes    Alcohol/week: 3.0 standard drinks    Types: 3 Cans of beer per week    Comment: last drink 06/05/18  . Drug use: Not Currently    Types: Marijuana    Comment: last time used 1` month ago     Allergies   Doxycycline and Sulfa antibiotics   Review of Systems Review of Systems  Constitutional: Positive for appetite change and fatigue.  HENT: Positive for sore throat.   Respiratory: Positive for shortness of breath.   Cardiovascular: Negative for chest pain.  Gastrointestinal: Positive  for abdominal pain, nausea and vomiting.  Genitourinary: Positive for enuresis.  Musculoskeletal: Positive for back pain.  Skin: Negative for wound.  Neurological: Positive for weakness.  Psychiatric/Behavioral: Negative for confusion.     Physical Exam Updated Vital Signs BP 122/86   Pulse (!) 112   Temp 97.9 F (36.6 C) (Oral)   Resp (!) 22   Ht 5\' 1"  (1.549 m)   Wt 36.7 kg   LMP 04/12/2013   SpO2 100%   BMI 15.29 kg/m   Physical Exam Vitals signs reviewed.  Constitutional:      Appearance: She is ill-appearing.     Comments: Patient is cachectic and chronically ill-appearing.  HENT:     Mouth/Throat:     Comments: Lips are dry and cracked.  Tongue is dry.  Mild posterior pharyngeal erythema.  Somewhat poor dentition. Eyes:     Extraocular Movements: Extraocular movements intact.  Cardiovascular:     Rate and Rhythm: Tachycardia present.  Pulmonary:     Breath sounds: No wheezing.  Abdominal:     Comments: Tenderness particularly left upper abdomen.  No distention.  Musculoskeletal:     Right lower leg: No edema.     Left lower leg: No edema.  Skin:    General: Skin is warm.     Capillary Refill: Capillary refill takes less than 2 seconds.  Neurological:     Comments: Patient is awake and appropriate.      ED Treatments / Results  Labs (all labs ordered are listed, but only abnormal results are displayed) Labs Reviewed  COMPREHENSIVE METABOLIC PANEL - Abnormal; Notable for the following components:      Result Value   Sodium 134 (*)    Potassium 2.2 (*)    CO2 19 (*)    Glucose, Bld 146 (*)    BUN 53 (*)    Total Protein 8.5 (*)    AST 59 (*)    Alkaline Phosphatase 154 (*)    Total Bilirubin 1.5 (*)    Anion gap 17 (*)    All other components within normal limits  LIPASE, BLOOD - Abnormal; Notable for the following components:   Lipase 76 (*)    All other components within normal limits  URINALYSIS, ROUTINE W REFLEX MICROSCOPIC - Abnormal;  Notable for the following components:   Color, Urine AMBER (*)    Hgb urine dipstick SMALL (*)    Ketones, ur 20 (*)    Protein, ur 100 (*)    Nitrite POSITIVE (*)    All other components within normal limits  CBC WITH DIFFERENTIAL/PLATELET - Abnormal; Notable for the following components:   Platelets 579 (*)    Lymphs Abs 0.5 (*)    All other components within normal limits  MAGNESIUM - Abnormal; Notable for the following components:   Magnesium 2.5 (*)    All other components within normal limits  SARS CORONAVIRUS 2 (HOSPITAL ORDER, PERFORMED IN [facility])  TSH  ETHANOL  HIV ANTIBODY (ROUTINE TESTING W REFLEX)    EKG EKG Interpretation  Date/Time:  Sunday Oct 14 2018 08:22:28 EDT Ventricular Rate:  123 PR Interval:    QRS Duration: 73 QT Interval:  395 QTC Calculation: 566 R Axis:   86 Text Interpretation:  Sinus tachycardia LAE, consider biatrial enlargement Probable LVH with secondary repol abnrm Prolonged QT interval Baseline wander in lead(s) V2 V4 Confirmed by Benjiman Core [id number]) on 10/14/2018 8:24:21 AM   Radiology Dg Abdomen Acute W/chest  Result Date: 10/14/2018 CLINICAL DATA:  Vomiting EXAM: DG ABDOMEN ACUTE W/ 1V CHEST COMPARISON:  08/04/2017 CT abdomen/pelvis. 10/09/2014 chest radiograph. FINDINGS: Stable cardiomediastinal silhouette with normal heart size. No pneumothorax. No pleural effusion. Lungs appear clear, with no acute consolidative airspace disease and no pulmonary edema. No dilated small bowel loops or air-fluid levels. Moderate gas and mild stool throughout the colon. No evidence of pneumatosis or pneumoperitoneum. No radiopaque nephrolithiasis. IMPRESSION: 1. No active cardiopulmonary disease. 2. Nonobstructive bowel gas pattern. Moderate gas and mild stool in the colon. Electronically Signed   By: Delbert Phenix M.D.   On: 10/14/2018 09:25    Procedures Procedures (including critical care time)  Medications Ordered in ED  Medications  potassium chloride 10 mEq in 100 mL IVPB (has no administration in time range)  sodium chloride 0.9 % bolus 1,000 mL (0 mLs Intravenous Stopped 10/14/18 0942)     Initial Impression / Assessment and Plan / ED Course  I have reviewed the triage vital signs and the nursing notes.  Pertinent labs & imaging results that were available during my care of the patient were reviewed by me and considered in my medical decision making (see chart for details).        Patient comes in with decreased oral intake.  Abdominal pain.  Appears clinically dehydrated.  I am surprised that her lab work does not look worse since she has lost 50 pounds over the last 2-1/2 months.  Has not been able to tolerate orals.  I think pancreatitis is likely.  Lipase is mildly elevated but could be a burned-out pancreas.  Has hypokalemia and not tolerating orals.  Will admit to hospitalist for further treatment.  Final Clinical Impressions(s) / ED Diagnoses   Final diagnoses:  Dehydration  Hypokalemia  Adult failure to thrive  Chronic pancreatitis, unspecified pancreatitis type [facility])    ED Discharge Orders    None       Benjiman Core, MD 10/14/18 1017

## 2018-10-15 ENCOUNTER — Inpatient Hospital Stay (HOSPITAL_COMMUNITY): Payer: Medicaid Other

## 2018-10-15 ENCOUNTER — Encounter (HOSPITAL_COMMUNITY): Payer: Self-pay | Admitting: Gastroenterology

## 2018-10-15 DIAGNOSIS — E860 Dehydration: Secondary | ICD-10-CM

## 2018-10-15 DIAGNOSIS — R1310 Dysphagia, unspecified: Secondary | ICD-10-CM

## 2018-10-15 DIAGNOSIS — K219 Gastro-esophageal reflux disease without esophagitis: Secondary | ICD-10-CM

## 2018-10-15 DIAGNOSIS — R627 Adult failure to thrive: Secondary | ICD-10-CM

## 2018-10-15 DIAGNOSIS — K861 Other chronic pancreatitis: Secondary | ICD-10-CM

## 2018-10-15 LAB — COMPREHENSIVE METABOLIC PANEL
ALT: 27 U/L (ref 0–44)
AST: 41 U/L (ref 15–41)
Albumin: 2.4 g/dL — ABNORMAL LOW (ref 3.5–5.0)
Alkaline Phosphatase: 101 U/L (ref 38–126)
Anion gap: 6 (ref 5–15)
BUN: 30 mg/dL — ABNORMAL HIGH (ref 6–20)
CO2: 22 mmol/L (ref 22–32)
Calcium: 7.7 mg/dL — ABNORMAL LOW (ref 8.9–10.3)
Chloride: 109 mmol/L (ref 98–111)
Creatinine, Ser: 0.43 mg/dL — ABNORMAL LOW (ref 0.44–1.00)
GFR calc Af Amer: >60 mL/min (ref >60)
GFR calc non Af Amer: >60 mL/min (ref >60)
Glucose, Bld: 177 mg/dL — ABNORMAL HIGH (ref 70–99)
Potassium: 3.2 mmol/L — ABNORMAL LOW (ref 3.5–5.1)
Sodium: 137 mmol/L (ref 135–145)
Total Bilirubin: 0.4 mg/dL (ref 0.3–1.2)
Total Protein: 5.3 g/dL — ABNORMAL LOW (ref 6.5–8.1)

## 2018-10-15 LAB — HIV ANTIBODY (ROUTINE TESTING W REFLEX): HIV Screen 4th Generation wRfx: NONREACTIVE

## 2018-10-15 LAB — CBC
HCT: 26.5 % — ABNORMAL LOW (ref 36.0–46.0)
Hemoglobin: 9.1 g/dL — ABNORMAL LOW (ref 12.0–15.0)
MCH: 32.4 pg (ref 26.0–34.0)
MCHC: 34.3 g/dL (ref 30.0–36.0)
MCV: 94.3 fL (ref 80.0–100.0)
Platelets: 287 10*3/uL (ref 150–400)
RBC: 2.81 MIL/uL — ABNORMAL LOW (ref 3.87–5.11)
RDW: 52.5 % — ABNORMAL HIGH (ref 11.5–15.5)
WBC: 4.3 10*3/uL (ref 4.0–10.5)

## 2018-10-15 MED ORDER — PRO-STAT SUGAR FREE PO LIQD
30.0000 mL | Freq: Three times a day (TID) | ORAL | Status: DC
  Administered 2018-10-15 – 2018-10-18 (×7): 30 mL via ORAL
  Filled 2018-10-15 (×8): qty 30

## 2018-10-15 MED ORDER — PANTOPRAZOLE SODIUM 40 MG IV SOLR
40.0000 mg | Freq: Two times a day (BID) | INTRAVENOUS | Status: DC
  Administered 2018-10-15 – 2018-10-16 (×2): 40 mg via INTRAVENOUS
  Filled 2018-10-15 (×2): qty 40

## 2018-10-15 MED ORDER — SODIUM CHLORIDE 0.9 % IV SOLN: INTRAVENOUS | Status: DC

## 2018-10-15 MED ORDER — ADULT MULTIVITAMIN LIQUID CH
15.0000 mL | Freq: Every day | ORAL | Status: DC
  Administered 2018-10-15 – 2018-10-23 (×8): 15 mL via ORAL
  Filled 2018-10-15 (×9): qty 15

## 2018-10-15 MED ORDER — PNEUMOCOCCAL VAC POLYVALENT 25 MCG/0.5ML IJ INJ
0.5000 mL | INJECTION | Freq: Once | INTRAMUSCULAR | Status: AC
  Administered 2018-10-15: 16:00:00 0.5 mL via INTRAMUSCULAR
  Filled 2018-10-15: qty 0.5

## 2018-10-15 MED ORDER — LORAZEPAM 2 MG/ML IJ SOLN
0.5000 mg | Freq: Three times a day (TID) | INTRAMUSCULAR | Status: DC | PRN
  Administered 2018-10-15 – 2018-10-23 (×18): 0.5 mg via INTRAVENOUS
  Filled 2018-10-15 (×18): qty 1

## 2018-10-15 NOTE — Progress Notes (Signed)
Initial Nutrition Assessment  DOCUMENTATION CODES:   Severe malnutrition in context of chronic illness, Underweight    INTERVENTION:  High Calorie/ High Protein Nutrition Therapy- handout/review provided  Boost Breeze po TID, each supplement provides 250 kcal and 9 grams of protein   ProStat 30 ml TID (each 30 ml provides 100 kcal, 15 gr protein)   Agree with recommendation of PEG placement to meet nutrition requirements if patient is unable to progress successfully with oral route  Recommend daily weights to establish baseline and verify severity of change   NUTRITION DIAGNOSIS:   Severe Malnutrition related to chronic illness(chronic abdominal pain, pancreatitis and appetite loss ) as evidenced by per patient/family report, percent weight loss, severe fat depletion, severe muscle depletion.   GOAL:  Pt to meet >/= 90% of their estimated nutrition needs     MONITOR:  Diet advancement, ability of patient to meet est needs and weight trends     REASON FOR ASSESSMENT:  Consult-Malnutrition-Adult Failure to Thrive  ASSESSMENT: Patient is an underweight chronically ill appearing 48 yo female who presents with c/o abdominal pain, wt loss, N/V, swallow problem. History of acholic pancreatitis, alcohol abuse, GERD, IBS and panic attacks.   CT abdomen and pelvis-findings: multiple cystic lesions throughout her pancreas. Esophagram - no acute findings  Meal intake is poor and has been since before February. February 15th  pt presents to ED c/o appetite change, wt loss which had been a problem at least 2 months prior. She was d/c AMA at that time. Today During RD visit patient lunch is here clear liquids - a couple of bites of jello taken and sips of Boost Breeze.  Patient says she is able only to take in small amounts of fluid and then feels full. The patient has been taking in sips of liquids but in insufficient amounts. Dehydrated on admission. Discussion of possible PEG if pt  unable to meet nutrition requirements orally.  Weight loss is severe. According to hospital records-she weighed 61.2 kg December 2019 and  59.9 kg recorded in February and down to to current 36.7 kg. Strongly recommend obtain re-weight and place on daily weights to verify severity of change. Severely malnourished patient.   Medications reviewed and include: folic acid, MVI, B-1, Protonix   Labs:  BMP Latest Ref Rng & Units 10/15/2018 10/14/2018 07/28/2018  Glucose 70 - 99 mg/dL 621(H) 086(V) 784(O)  BUN 6 - 20 mg/dL 96(E) 95(M) 19  Creatinine 0.44 - 1.00 mg/dL 8.41(L) 2.44 0.10  Sodium 135 - 145 mmol/L 137 134(L) 135  Potassium 3.5 - 5.1 mmol/L 3.2(L) 2.2(LL) 2.9(L)  Chloride 98 - 111 mmol/L 109 98 97(L)  CO2 22 - 32 mmol/L 22 19(L) 26  Calcium 8.9 - 10.3 mg/dL 7.7(L) 9.9 8.5(L)     NUTRITION - FOCUSED PHYSICAL EXAM:    Most Recent Value  Orbital Region  Severe depletion  Upper Arm Region  Severe depletion  Thoracic and Lumbar Region  Severe depletion  Buccal Region  Moderate depletion  Temple Region  Moderate depletion  Clavicle Bone Region  Severe depletion  Clavicle and Acromion Bone Region  Severe depletion  Scapular Bone Region  Severe depletion  Dorsal Hand  Mild depletion  Patellar Region  Severe depletion  Anterior Thigh Region  Severe depletion  Posterior Calf Region  Severe depletion  Edema (RD Assessment)  None  Hair  Unable to assess  Eyes  Reviewed  Mouth  Reviewed  Skin  Reviewed [tatoos accross fingers of both hands]  Diet Order:   Diet Order            Diet NPO time specified Except for: Sips with Meds  Diet effective midnight        Diet clear liquid Room service appropriate? Yes; Fluid consistency: Thin  Diet effective now              EDUCATION NEEDS:   Education needs have been addressed(High Calorie High Protein ) Skin:  Skin Assessment: Reviewed RN Assessment  Last BM:  4/28  Height:   Ht Readings from Last 1 Encounters:  10/14/18 5'  1" (1.549 m)    Weight:   Wt Readings from Last 1 Encounters:  10/14/18 36.7 kg    Ideal Body Weight:  48 kg  BMI:  Body mass index is 15.29 kg/m.  Estimated Nutritional Needs:   Kcal:  1444-1520 (38-40 kcal/kg/bw)  Protein:  55-63 gr (1.5-1.7 gr/kg/bw)  Fluid:  >1100 ml daily   Royann ShiversLynn Marck Mcclenny MS,RD,CSG,LDN Office: [phone] Pager: [phone]

## 2018-10-15 NOTE — Progress Notes (Signed)
PROGRESS NOTE    Monique Nguyen  ZOX:[id number] DOB: 12/06/1970 DOA: 10/14/2018 PCP: Patient, No Pcp Per     Brief Narrative:  48 y.o. female with past medical history significant for tobacco abuse, depression and/anxiety, gastroesophageal reflux disease, history of alcoholic pancreatitis and psoriasis; who presented to the hospital secondary to increased abdominal pain, nausea, vomiting and inability to eat.  Patient reports that for the last month she has no being able to eat essentially anything (including having trouble even with liquids).  She has no significant amount of weight and expressed increased abdominal pain.  Pain is localized in her epigastric area/mid section, radiated to the left side of back, 10 out of 10 in intensity, no alleviating factors, associated with anorexia, nausea and intermittent episodes of vomiting (there has not been any blood seen in the vomit content). Patient denies any fever, chills, chest pain, shortness of breath, hematuria, dysuria, melena, hematochezia, focal weakness or any other complaints.  There has not been any sick contacts or recent travels.  Assessment & Plan: 1-acute on chronic pancreatitis (HCC) -Continue as needed pain medication, antiemetics and supportive care -Continue IV fluids -Dietitian to assist with feeding supplements -CT scan done yesterday demonstrated development of pseudocyst creating pressure in her esophagus and most likely being responsible for chronic abdominal pain and difficulty swallowing. -GI planning endoscopy on 10/16/2018.  2-Depression/anxiety -Will need outpatient follow-up with psychiatry service -No suicidal ideation or hallucination -Patient reports no using any antidepressant prior to admission. -Very anxious today, will use low-dose anxiolytic medication to assist with her mood.  3-Hypokalemia -Improved after repletion -Continue following electrolytes trend and further replete as needed -Continue  monitoring on telemetry for now.  4-Adult failure to thrive/severe protein calorie malnutrition -Will follow recommendations by dietitian regarding feeding supplements. -Most likely associated with chronic pancreatitis -Once able to tolerate by mouth will recommend the use of Creon  5-GERD (gastroesophageal reflux disease) -Continue PPI  6-Dehydration -Continue IV fluids and supportive care.   7-Dysphagia -EGD is planned for tomorrow 10/16/2018 by gastroenterology service. -Continue PPI.  8-tobacco abuse -Cessation counseling has been provided -Continue nicotine patch.  DVT prophylaxis: Heparin Code Status: Full code Family Communication: No family at bedside. Disposition Plan: Remains in the hospital, continue IV fluids and electrolytes repletion, follow GI service recommendations (with anticipated EGD on 10/16/2018).  Continue IV PPI.  Consultants:   Gastroenterology  Procedures:   See below for x-ray reports.  Antimicrobials:  Anti-infectives (From admission, onward)   None     Subjective: Afebrile, no vomiting, no shortness of breath.  Patient continued reporting abdominal pain, nausea and difficulty swallowing sensations.  She was very anxious this morning.  Objective: Vitals:   10/14/18 2200 10/15/18 0511 10/15/18 0757 10/15/18 1508  BP: 115/77 103/69  92/68  Pulse: 92 (!) 102  (!) 113  Resp: 18 18  20   Temp: 98 F (36.7 C) 97.8 F (36.6 C)  98 F (36.7 C)  TempSrc: Oral Oral  Oral  SpO2: 100% 100% 100% 100%  Weight:      Height:        Intake/Output Summary (Last 24 hours) at 10/15/2018 1642 Last data filed at 10/15/2018 1300 Gross per 24 hour  Intake 1798.27 ml  Output 800 ml  Net 998.27 ml   Filed Weights   10/14/18 0759  Weight: 36.7 kg    Examination: General exam: Alert, awake, oriented x 3; chronically ill in appearance and cachectic on exam.  Continue reporting intermittent nausea and abdominal  pain.  No further vomiting.  Patient is  afebrile. Respiratory system: Clear to auscultation. Respiratory effort normal. Cardiovascular system: Mild sinus tachycardia. No murmurs, rubs, gallops. Gastrointestinal system: Abdomen is nondistended, soft and nontender. No organomegaly or masses felt. Normal bowel sounds heard. Central nervous system: Alert and oriented. No focal neurological deficits. Extremities: No C/C/E, +pedal pulses Skin: No rashes, lesions or ulcers Psychiatry: Judgement and insight appear normal. Mood & affect appropriate.   Data Reviewed: I have personally reviewed following labs and imaging studies  CBC: Recent Labs  Lab 10/14/18 0818 10/15/18 0506  WBC 7.1 4.3  NEUTROABS 5.8  --   HGB 13.4 9.1*  HCT 37.4 26.5*  MCV 91.2 94.3  PLT 579* 287   Basic Metabolic Panel: Recent Labs  Lab 10/14/18 0818 10/14/18 1033 10/15/18 0506  NA 134*  --  137  K 2.2*  --  3.2*  CL 98  --  109  CO2 19*  --  22  GLUCOSE 146*  --  177*  BUN 53*  --  30*  CREATININE 0.73  --  0.43*  CALCIUM 9.9  --  7.7*  MG 2.5*  --   --   PHOS  --  1.8*  --    GFR: Estimated Creatinine Clearance: 50.4 mL/min (A) (by C-G formula based on SCr of 0.43 mg/dL (L)).   Liver Function Tests: Recent Labs  Lab 10/14/18 0818 10/15/18 0506  AST 59* 41  ALT 33 27  ALKPHOS 154* 101  BILITOT 1.5* 0.4  PROT 8.5* 5.3*  ALBUMIN 4.1 2.4*   Recent Labs  Lab 10/14/18 0818  LIPASE 76*   Thyroid Function Tests: Recent Labs    10/14/18 0818  TSH 0.978   Anemia Panel: Recent Labs    10/14/18 1032  VITAMINB12 654   Urine analysis:    Component Value Date/Time   COLORURINE AMBER (A) 10/14/2018 0810   APPEARANCEUR CLEAR 10/14/2018 0810   LABSPEC 1.018 10/14/2018 0810   PHURINE 6.0 10/14/2018 0810   GLUCOSEU NEGATIVE 10/14/2018 0810   HGBUR SMALL (A) 10/14/2018 0810   BILIRUBINUR NEGATIVE 10/14/2018 0810   KETONESUR 20 (A) 10/14/2018 0810   PROTEINUR 100 (A) 10/14/2018 0810   UROBILINOGEN 0.2 03/06/2015 1558   NITRITE  POSITIVE (A) 10/14/2018 0810   LEUKOCYTESUR NEGATIVE 10/14/2018 0810    Recent Results (from the past 240 hour(s))  SARS Coronavirus 2 (CEPHEID - Performed in [facility] hospital lab), Hosp Order     Status: None   Collection Time: 10/14/18  8:11 AM  Result Value Ref Range Status   SARS Coronavirus 2 NEGATIVE NEGATIVE Final    Comment: (NOTE) If result is NEGATIVE SARS-CoV-2 target nucleic acids are NOT DETECTED. The SARS-CoV-2 RNA is generally detectable in upper and lower  respiratory specimens during the acute phase of infection. The lowest  concentration of SARS-CoV-2 viral copies this assay can detect is 250  copies / mL. A negative result does not preclude SARS-CoV-2 infection  and should not be used as the sole basis for treatment or other  patient management decisions.  A negative result may occur with  improper specimen collection / handling, submission of specimen other  than nasopharyngeal swab, presence of viral mutation(s) within the  areas targeted by this assay, and inadequate number of viral copies  (<250 copies / mL). A negative result must be combined with clinical  observations, patient history, and epidemiological information. If result is POSITIVE SARS-CoV-2 target nucleic acids are DETECTED. The SARS-CoV-2 RNA  is generally detectable in upper and lower  respiratory specimens dur ing the acute phase of infection.  Positive  results are indicative of active infection with SARS-CoV-2.  Clinical  correlation with patient history and other diagnostic information is  necessary to determine patient infection status.  Positive results do  not rule out bacterial infection or co-infection with other viruses. If result is PRESUMPTIVE POSTIVE SARS-CoV-2 nucleic acids MAY BE PRESENT.   A presumptive positive result was obtained on the submitted specimen  and confirmed on repeat testing.  While 2019 novel coronavirus  (SARS-CoV-2) nucleic acids may be present in the  submitted sample  additional confirmatory testing may be necessary for epidemiological  and / or clinical management purposes  to differentiate between  SARS-CoV-2 and other Sarbecovirus currently known to infect humans.  If clinically indicated additional testing with an alternate test  methodology [phone]) is advised. The SARS-CoV-2 RNA is generally  detectable in upper and lower respiratory sp ecimens during the acute  phase of infection. The expected result is Negative. Fact Sheet for Patients:  BoilerBrush.com.cy Fact Sheet for Healthcare Providers: https://pope.com/ This test is not yet approved or cleared by the [place] FDA and has been authorized for detection and/or diagnosis of SARS-CoV-2 by FDA under an Emergency Use Authorization (EUA).  This EUA will remain in effect (meaning this test can be used) for the duration of the COVID-19 declaration under Section 564(b)(1) of the Act, 21 U.S.C. section 360bbb-3(b)(1), unless the authorization is terminated or revoked sooner. Performed at [facility], [address], [place], [place] [address]      Radiology Studies: Ct Abdomen Pelvis W Contrast  Result Date: 10/14/2018 CLINICAL DATA:  Inpatient. Failure to thrive. Chronic abdominal pain. Difficulty swallowing with sensation of food sticking in throat. Weight loss. History of pancreatitis. EXAM: CT ABDOMEN AND PELVIS WITH CONTRAST TECHNIQUE: Multidetector CT imaging of the abdomen and pelvis was performed using the standard protocol following bolus administration of intravenous contrast. CONTRAST:  53mL OMNIPAQUE IOHEXOL 300 MG/ML SOLN, 11mL OMNIPAQUE IOHEXOL 300 MG/ML SOLN COMPARISON:  10/14/2018 abdominal radiographs. 08/04/2017 CT abdomen/pelvis. FINDINGS: Motion degraded scan, partially limiting assessment. Lower chest: Right middle lobe 2 mm solid pulmonary nodule (series 5/image 1), not previously imaged. Additional tiny 2 mm  pulmonary nodules at both lung bases are unchanged from 2017 CT, considered benign. Irregular thick walled 2.5 x 1.7 x 3.2 cm fluid collection along the anterior margin of the lower thoracic esophagus (series 2/image 11), new since 08/04/2017 CT. Similar irregular thick walled curvilinear fluid collection posterior to the esophagogastric junction extending into the right retrocrural region measuring 3.2 x 1.5 x 4.1 cm (series 2/image 17), new. Hepatobiliary: Normal liver size. No liver mass. Normal gallbladder with no radiopaque cholelithiasis. No biliary ductal dilatation. CBD diameter 5 mm. Pancreas: Chronic atrophy of distal body and tail of the pancreas with associated pancreatic duct dilation in these pancreatic segments, compatible with chronic pancreatitis. New thick-walled 4.1 x 2.9 x 3.5 cm pancreatic head cystic lesion (series 2/image 26). Posterior pancreatic head 1.0 x 0.8 cm cystic lesion (series 2/image 28), mildly increased from 0.7 x 0.6 cm. New clustered cystic pancreatic lesions in the pancreatic body, largest 2.4 x 1.7 cm anteriorly (series 2/image 21). No significant acute peripancreatic fat stranding. Spleen: Normal size. No mass. Adrenals/Urinary Tract: Normal adrenals. Simple 1.1 cm upper left renal cyst. Otherwise normal kidneys, with no hydronephrosis. Chronic mild diffuse bladder wall thickening with moderate bladder distention. Stomach/Bowel: Mild wall thickening throughout the esophagogastric  junction. Irregular thick walled fluid collection along the posterior margin of the antropyloric stomach measuring approximately 3.7 x 2.0 x 5.1 cm (series 2/image 31), new, exerting mild extrinsic mass-effect on the distal stomach. Stomach is not significantly distended. Normal caliber small bowel with no small bowel wall thickening. Oral contrast transits to the distal small bowel. Normal appendix. Normal large bowel with no diverticulosis, large bowel wall thickening or pericolonic fat stranding.  Vascular/Lymphatic: Atherosclerotic nonaneurysmal abdominal aorta. Patent hepatic, portal and renal veins. Diminutive splenic vein, which remains patent, unchanged. No pathologically enlarged lymph nodes in the abdomen or pelvis. New ill-defined curvilinear soft tissue in the central mesentery, which appears to communicate with the posterior distal gastric fluid collection. Reproductive: Grossly normal uterus. No discrete adnexal mass. Small volume hemoperitoneum in the pelvic cul-de-sac. Other: No pneumoperitoneum.  No abdominal ascites. Musculoskeletal: No aggressive appearing focal osseous lesions. IMPRESSION: 1. 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 Redemonstrated findings of chronic pancreatitis in the distal body and tail of the pancreas. 2. New irregular thick walled fluid collections encasing the esophagogastric junction anteriorly and posteriorly with associated wall thickening throughout the esophagogastric junction. Similar new irregular thick walled fluid collection along the posterior margin of the antropyloric stomach with some mass effect on the distal stomach, which is not significantly distended. These nonspecific findings also presumably represent pancreatic pseudocysts with reactive wall thickening at the esophagogastric junction and distal stomach. Infected collections cannot be excluded. 3. Close CT or MRI abdomen follow-up imaging advised. 4. No biliary ductal dilatation.  No radiopaque cholelithiasis. 5. Nonspecific small volume hemoperitoneum in the pelvic cul-de-sac. No discrete adnexal mass. Suggest follow-up pelvic ultrasound on a short-term basis when clinically feasible. 6. Nonspecific chronic diffuse bladder wall thickening. Moderate bladder distention. No hydronephrosis. 7. Right middle lobe 2 mm solid pulmonary nodule, not previously imaged. No follow-up needed if patient is  low-risk. Non-contrast chest CT can be considered in 12 months if patient is high-risk. This recommendation follows the consensus statement: Guidelines for Management of Incidental Pulmonary Nodules Detected on CT Images:From the Fleischner Society 2017; published online before print (10.1148/radiol.[id number]). 8.  Aortic Atherosclerosis (ICD10-I70.0). Electronically Signed   By: Delbert Phenix M.D.   On: 10/14/2018 12:49   Dg Abdomen Acute W/chest  Result Date: 10/14/2018 CLINICAL DATA:  Vomiting EXAM: DG ABDOMEN ACUTE W/ 1V CHEST COMPARISON:  08/04/2017 CT abdomen/pelvis. 10/09/2014 chest radiograph. FINDINGS: Stable cardiomediastinal silhouette with normal heart size. No pneumothorax. No pleural effusion. Lungs appear clear, with no acute consolidative airspace disease and no pulmonary edema. No dilated small bowel loops or air-fluid levels. Moderate gas and mild stool throughout the colon. No evidence of pneumatosis or pneumoperitoneum. No radiopaque nephrolithiasis. IMPRESSION: 1. No active cardiopulmonary disease. 2. Nonobstructive bowel gas pattern. Moderate gas and mild stool in the colon. Electronically Signed   By: Delbert Phenix M.D.   On: 10/14/2018 09:25   Dg Esophagus W Single Cm (sol Or Thin Ba)  Result Date: 10/15/2018 CLINICAL DATA:  Dysphagia, lower esophageal and epigastric pain EXAM: ESOPHOGRAM / BARIUM SWALLOW / BARIUM TABLET STUDY TECHNIQUE: Combined double contrast and single contrast examination performed using effervescent crystals, thick barium liquid, and thin barium liquid. The patient was observed with fluoroscopy swallowing a 13 mm barium sulphate tablet. FLUOROSCOPY TIME:  Fluoroscopy Time:  1 minutes 30 seconds Radiation Exposure Index (if provided by the fluoroscopic device): 17.9 mGy Number of Acquired Spot Images: multiple fluoroscopic screen  captures COMPARISON:  None FINDINGS: Esophageal distention: Normal without mass or stricture Filling defects:  None 12.5 mm barium tablet:  Easily passed from oral cavity to stomach without obstruction. Motility:  Normal Mucosa:  Smooth without irregularity or ulceration Hypopharynx/cervical esophagus: Normal motion without laryngeal penetration or aspiration Hiatal hernia:  Absent GE reflux:  Not identified during exam Other:  N/A IMPRESSION: Unremarkable esophagram. Electronically Signed   By: Ulyses Southward M.D.   On: 10/15/2018 15:06    Scheduled Meds: . feeding supplement  1 Container Oral TID BM  . feeding supplement (PRO-STAT SUGAR FREE 64)  30 mL Oral TID  . folic acid  1 mg Intravenous Daily  . heparin injection (subcutaneous)  5,000 Units Subcutaneous Q8H  . multivitamin  15 mL Oral Daily  . nicotine  21 mg Transdermal Daily  . pantoprazole (PROTONIX) IV  40 mg Intravenous BID AC  . thiamine injection  100 mg Intravenous Daily   Continuous Infusions: . sodium chloride    . dextrose 5 % and 0.9 % NaCl with KCl 40 mEq/L 100 mL/hr at 10/15/18 0859     LOS: 1 day    Time spent: 30 minutes.   Vassie Loll, MD [facility] Pager [phone]   10/15/2018, 4:42 PM

## 2018-10-15 NOTE — Consult Note (Addendum)
Referring Provider: Dr. Gwenlyn PerkingMadera Primary Care Physician:  Patient, No Pcp Per Primary Gastroenterologist:  [facility], last physically seen 2017, [facility], last seen in office June 2015  Date of Admission: 10/14/2018 Date of Consultation: 10/15/2018  Reason for Consultation:  Pancreatitis, dysphagia, intractable N/V, failure to thrive  HPI:  Monique Nguyen is a 48 y.o. year old female with a long-standing history of abdominal pain, history of pancreatitis due to ETOH abuse, necrotizing pancreatitis in 2014, [facility]. [facility]. Numerous phone calls in Care Everywhere to GI. [facility].   Presented yesterday with failure to thrive, worsening abdominal pain, weight loss, dysphagia, odynophagia, nausea and occasional vomiting. CT abd/pelvis completed as below, with multiple new thick-walled pancreatic cystic lesions, largest 4.1 cm in pancreatic head, chronic pancreatitis, new irregular thick-walled fluid collections encasing the EG junction both anteriorly and posterioly with associated wall thickening of EG junction. New irregular thicked walled fluid collection along posterior margin of antropyloric stomach with some mass effect on distal stomach, not significantly distended. 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 Unable to exclude infected collections. New finding of right middle lobe 2 mm solid pulmonary nodule. Additional findings below. Prior history outlined below:   [facility] with stable atrophy of the distal body and tail of the pancreas with dilatation of the main pancreatic duct similar to prior CT and probably sequela of recurrent/chronic pancreatitis. No active inflammatory changes. 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 [facility], with a  weight of 135. At that visit, [facility] due to PD dilatation with narrowing at level of body/tail junction, mild dilation of common hepatic duct with smooth tapering. EUS July 2017 with mild to moderate chronic pancreatitis, pancreatic duct with dilation and intraductal stones, measuring up to 6 mm in diameter, no abnormality in main bile duct, abnormal lymph nodes in perigastric region, largest measuring 12 mm in diameter, s/p fine needle biopsy. Pathology with multiple fragments of lymphoid tissue with granulomatous inflammation, negative GMS and AFB stains, no suggestion of hematolymphoid neoplasm in sampled tissue. There was no physical follow-up of patient, but she had multiple phone calls dating from 2017 through most recent Aug 2019.   Prior to this, she also underwent EUS in 2014 by Dr. Christella HartiganJacobs with 3.7 cm cystic lesion in body of pancreas with large amount of debris, s/p FNA with reddish, milky fluid, main pancreatic duct normal. 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 Pathology without signs of infection or malignancy.    [facility]. [facility]. [facility], EUS recommended  due to PD dilatation with narrowing at level of body/tail junction, mild dilation of common hepatic duct with smooth tapering. EUS July 2017 with mild to moderate chronic pancreatitis, pancreatic duct with dilation and intraductal stones, measuring up to 6 mm in diameter, no abnormality in main bile duct, abnormal lymph nodes in perigastric region, largest measuring 12 mm in diameter, s/p fine needle biopsy. Pathology with multiple fragments of lymphoid tissue with granulomatous inflammation, negative GMS and AFB stains, no suggestion of hematolymphoid neoplasm in sampled tissue.   Patient states she has had worsening abdominal pain over past few months, and for  the past year she has been declining. States everything "snuck up" on her. No vomiting per  patient but notes severe nausea. Abdominal pain worsened throughout the day. Nausea is constant. Weight 132 in Feb 2020. Now 80 lbs. History of ETOH abuse but none in at least a month. Poor historian with difficulty telling me dates, time related to symptoms and onset of worsening symptoms. She states she has only had water and an occasional beanie weenie. Can't remember last normal oral intake of food. Solid food dysphagia. +odynophagia, which is chronic. Early satiety with just small amounts, including liquids. Points to mid throat and states food sticks there through mid sternum. No difficulty with fluids; however, unable to drink large amounts. No BM in "awhile". Unable to remember the last time. States she has had decreased BMs over past months. +reflux. States she hasn't left the house in a year. Stays in her bedroom. Severe weakness, fatigue with leaving the bed to bathroom and requires assistance. Notes feeling sad but no suicidal ideation.  Lipase marginally elevated on admission. No leukocytosis. Hgb 9.1 today, down from 13.4 but no overt GI bleeding. Profound hypokalemia on admission.    Past Medical History:  Diagnosis Date  . Alcohol abuse   . Anxiety   . Bacterial vaginosis   . Chronic abdominal pain   . Chronic pain   . Depression   . GERD (gastroesophageal reflux disease)   . IBS (irritable bowel syndrome)   . Pancreatitis    ETOH  . Panic attacks   . Psoriasis     Past Surgical History:  Procedure Laterality Date  . BLADDER SURGERY     X 3  . EUS N/A 10/11/2012   Dr. Christella Hartigan: 3.7 cm cystic lesion in body of pancreas with large amount of debris, s/p FNA with reddish, milky fluid, main pancreatic duct normal,   . EUS  12/2015   mild to moderate chronic pancreatitis, pancreatic duct with dilation and intraductal stones, measuring up to 6 mm in diameter, no abnormality in main bile duct, abnormal lymph nodes in perigastric region, largest measuring 12 mm in diameter, s/p fine  needle biopsy. Pathology with multiple fragments of lymphoid tissue with granulomatous inflammation, negative GMS and AFB stains,    Prior to Admission medications   Not on File    Current Facility-Administered Medications  Medication Dose Route Frequency Provider Last Rate Last Dose  . dextrose 5 % and 0.9 % NaCl with KCl 40 mEq/L infusion   Intravenous Continuous Vassie Loll, MD 100 mL/hr at 10/15/18 0859    . feeding supplement (BOOST / RESOURCE BREEZE) liquid 1 Container  1 Container Oral TID BM Vassie Loll, MD   1 Container at 10/15/18 [phone]  . folic acid injection 1 mg  1 mg Intravenous Daily Vassie Loll, MD   1 mg at 10/14/18 2148  . heparin injection 5,000 Units  5,000 Units Subcutaneous Q8H Vassie Loll, MD   5,000 Units at 10/15/18 [phone]  . lipase/protease/amylase (CREON) capsule 36,000 Units  36,000 Units Oral TID WC Vassie Loll, MD   36,000 Units at 10/15/18 [phone]  . morphine 2 MG/ML injection 2 mg  2 mg Intravenous Q3H PRN Vassie Loll, MD   2 mg at 10/15/18 0847  . nicotine (NICODERM CQ - dosed in mg/24 hours) patch 21 mg  21 mg Transdermal Daily Vassie Loll, MD   Stopped at 10/15/18 0900  . pantoprazole (PROTONIX) injection 40 mg  40 mg Intravenous Q24H Vassie Loll, MD  40 mg at 10/14/18 1929  . pneumococcal 23 valent vaccine (PNU-IMMUNE) injection 0.5 mL  0.5 mL Intramuscular Tomorrow-1000 Vassie Loll, MD      . prochlorperazine (COMPAZINE) injection 10 mg  10 mg Intravenous Q6H PRN Vassie Loll, MD      . thiamine (B-1) injection 100 mg  100 mg Intravenous Daily Vassie Loll, MD   100 mg at 10/15/18 0847    Allergies as of 10/14/2018 - Review Complete 10/14/2018  Allergen Reaction Noted  . Doxycycline Other (See Comments) 07/07/2012  . Sulfa antibiotics Nausea And Vomiting and Other (See Comments) 11/10/2010    Family History  Problem Relation Age of Onset  . Depression Mother        living  . Pancreatic cancer Father        PATIENT STATES  PROSTATE CANCER. UNCLEAR IF ACTUAL PANCREATIC CANCER  . Colon cancer Neg Hx   . Colon polyps Neg Hx     Social History   Socioeconomic History  . Marital status: Widowed    Spouse name: Not on file  . Number of children: Not on file  . Years of education: Not on file  . Highest education level: Not on file  Occupational History  . Not on file  Social Needs  . Financial resource strain: Not on file  . Food insecurity:    Worry: Not on file    Inability: Not on file  . Transportation needs:    Medical: Not on file    Non-medical: Not on file  Tobacco Use  . Smoking status: Current Some Day Smoker    Packs/day: 1.00    Years: 8.00    Pack years: 8.00    Types: Cigarettes  . Smokeless tobacco: Never Used  . Tobacco comment: several cigarretes a day  Substance and Sexual Activity  . Alcohol use: Yes    Alcohol/week: 3.0 standard drinks    Types: 3 Cans of beer per week    Comment: last drink 06/05/18, hx of ETOH abuse  . Drug use: Not Currently    Types: Marijuana    Comment: last time used 1` month ago. DENIED DRUG USE  on 10/15/2018  . Sexual activity: Yes    Birth control/protection: None  Lifestyle  . Physical activity:    Days per week: Not on file    Minutes per session: Not on file  . Stress: Not on file  Relationships  . Social connections:    Talks on phone: Not on file    Gets together: Not on file    Attends religious service: Not on file    Active member of club or organization: Not on file    Attends meetings of clubs or organizations: Not on file    Relationship status: Not on file  . Intimate partner violence:    Fear of current or ex partner: Not on file    Emotionally abused: Not on file    Physically abused: Not on file    Forced sexual activity: Not on file  Other Topics Concern  . Not on file  Social History Narrative  . Not on file    Review of Systems: Gen: see HPI CV: +palpitations Resp: +SOB at rest, +DOE GI: see HPI GU : Denies  urinary burning, urinary frequency, urinary incontinence.  MS: +weakness, joint pain Derm: Denies rash, itching, dry skin Psych: +Depression/anxiety, no SI Heme: Denies bruising, bleeding, and enlarged lymph nodes.  Physical Exam: Vital signs in last 24 hours:  Temp:  [97.8 F (36.6 C)-98.2 F (36.8 C)] 97.8 F (36.6 C) (05/04 0511) Pulse Rate:  [92-105] 102 (05/04 0511) Resp:  [18-20] 18 (05/04 0511) BP: (103-132)/(69-82) 103/69 (05/04 0511) SpO2:  [100 %] 100 % (05/04 0757) Last BM Date: 10/09/18  Wt Readings from Last 3 Encounters:  10/14/18 36.7 kg  07/28/18 59.9 kg  06/05/18 61.2 kg  Body mass index is 15.29 kg/m.  General:   Alert and oriented, appears chronically-ill appearing, thin Head:  Normocephalic and atraumatic. Eyes:  Sclera clear, no icterus.    Ears:  Normal auditory acuity. Lungs:  Clear throughout to auscultation. Effort with deep inspiration, no tachypnea. Mild shortness of breath with talking.  Heart:  S1 S2 present, no appreciable murmurs  Abdomen:  Soft, full-appearing, mild to moderate LLQ pain, RLQ mild TTP, epigastric with significant tenderness with light palpation   Rectal:  Deferred  Msk:  Symmetrical without gross deformities.  Extremities:  Without edema. 1-2+ DP pulses Neurologic:  Alert and  oriented x4, scattered and anxious  Skin:  Intact without significant lesions or rashes. Psych:  Emotional, close to tears at times during consultation, conversant but anxious.  Intake/Output from previous day: 05/03 0701 - 05/04 0700 In: 2736.5 [P.O.:240; I.V.:1398.6; IV Piggyback:1097.8] Out: 300 [Urine:300] Intake/Output this shift: Total I/O In: 240 [P.O.:240] Out: -   Lab Results: Recent Labs    10/14/18 0818 10/15/18 0506  WBC 7.1 4.3  HGB 13.4 9.1*  HCT 37.4 26.5*  PLT 579* 287   BMET Recent Labs    10/14/18 0818 10/15/18 0506  NA 134* 137  K 2.2* 3.2*  CL 98 109  CO2 19* 22  GLUCOSE 146* 177*  BUN 53* 30*  CREATININE 0.73  0.43*  CALCIUM 9.9 7.7*   LFT Recent Labs    10/14/18 0818 10/15/18 0506  PROT 8.5* 5.3*  ALBUMIN 4.1 2.4*  AST 59* 41  ALT 33 27  ALKPHOS 154* 101  BILITOT 1.5* 0.4    Studies/Results: Ct Abdomen Pelvis W Contrast  Result Date: 10/14/2018 CLINICAL DATA:  Inpatient. Failure to thrive. Chronic abdominal pain. Difficulty swallowing with sensation of food sticking in throat. Weight loss. History of pancreatitis. EXAM: CT ABDOMEN AND PELVIS WITH CONTRAST TECHNIQUE: Multidetector CT imaging of the abdomen and pelvis was performed using the standard protocol following bolus administration of intravenous contrast. CONTRAST:  30mL OMNIPAQUE IOHEXOL 300 MG/ML SOLN, 75mL OMNIPAQUE IOHEXOL 300 MG/ML SOLN COMPARISON:  10/14/2018 abdominal radiographs. 08/04/2017 CT abdomen/pelvis. FINDINGS: Motion degraded scan, partially limiting assessment. Lower chest: Right middle lobe 2 mm solid pulmonary nodule (series 5/image 1), not previously imaged. Additional tiny 2 mm pulmonary nodules at both lung bases are unchanged from 2017 CT, considered benign. Irregular thick walled 2.5 x 1.7 x 3.2 cm fluid collection along the anterior margin of the lower thoracic esophagus (series 2/image 11), new since 08/04/2017 CT. Similar irregular thick walled curvilinear fluid collection posterior to the esophagogastric junction extending into the right retrocrural region measuring 3.2 x 1.5 x 4.1 cm (series 2/image 17), new. Hepatobiliary: Normal liver size. No liver mass. Normal gallbladder with no radiopaque cholelithiasis. No biliary ductal dilatation. CBD diameter 5 mm. Pancreas: Chronic atrophy of distal body and tail of the pancreas with associated pancreatic duct dilation in these pancreatic segments, compatible with chronic pancreatitis. New thick-walled 4.1 x 2.9 x 3.5 cm pancreatic head cystic lesion (series 2/image 26). Posterior pancreatic head 1.0 x 0.8 cm cystic lesion (series 2/image 28), mildly increased from 0.7 x  0.6 cm. New clustered cystic pancreatic lesions in the pancreatic body, largest 2.4 x 1.7 cm anteriorly (series 2/image 21). No significant acute peripancreatic fat stranding. Spleen: Normal size. No mass. Adrenals/Urinary Tract: Normal adrenals. Simple 1.1 cm upper left renal cyst. Otherwise normal kidneys, with no hydronephrosis. Chronic mild diffuse bladder wall thickening with moderate bladder distention. Stomach/Bowel: Mild wall thickening throughout the esophagogastric junction. Irregular thick walled fluid collection along the posterior margin of the antropyloric stomach measuring approximately 3.7 x 2.0 x 5.1 cm (series 2/image 31), new, exerting mild extrinsic mass-effect on the distal stomach. Stomach is not significantly distended. Normal caliber small bowel with no small bowel wall thickening. Oral contrast transits to the distal small bowel. Normal appendix. Normal large bowel with no diverticulosis, large bowel wall thickening or pericolonic fat stranding. Vascular/Lymphatic: Atherosclerotic nonaneurysmal abdominal aorta. Patent hepatic, portal and renal veins. Diminutive splenic vein, which remains patent, unchanged. No pathologically enlarged lymph nodes in the abdomen or pelvis. New ill-defined curvilinear soft tissue in the central mesentery, which appears to communicate with the posterior distal gastric fluid collection. Reproductive: Grossly normal uterus. No discrete adnexal mass. Small volume hemoperitoneum in the pelvic cul-de-sac. Other: No pneumoperitoneum.  No abdominal ascites. Musculoskeletal: No aggressive appearing focal osseous lesions. IMPRESSION: 1. 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 Redemonstrated findings of chronic pancreatitis in the distal body and tail of the pancreas. 2. New irregular thick walled fluid collections encasing the esophagogastric junction anteriorly and  posteriorly with associated wall thickening throughout the esophagogastric junction. Similar new irregular thick walled fluid collection along the posterior margin of the antropyloric stomach with some mass effect on the distal stomach, which is not significantly distended. These nonspecific findings also presumably represent pancreatic pseudocysts with reactive wall thickening at the esophagogastric junction and distal stomach. Infected collections cannot be excluded. 3. Close CT or MRI abdomen follow-up imaging advised. 4. No biliary ductal dilatation.  No radiopaque cholelithiasis. 5. Nonspecific small volume hemoperitoneum in the pelvic cul-de-sac. No discrete adnexal mass. Suggest follow-up pelvic ultrasound on a short-term basis when clinically feasible. 6. Nonspecific chronic diffuse bladder wall thickening. Moderate bladder distention. No hydronephrosis. 7. Right middle lobe 2 mm solid pulmonary nodule, not previously imaged. No follow-up needed if patient is low-risk. Non-contrast chest CT can be considered in 12 months if patient is high-risk. This recommendation follows the consensus statement: Guidelines for Management of Incidental Pulmonary Nodules Detected on CT Images:From the Fleischner Society 2017; published online before print (10.1148/radiol.[id number]). 8.  Aortic Atherosclerosis (ICD10-I70.0). Electronically Signed   By: Delbert Phenix M.D.   On: 10/14/2018 12:49   Dg Abdomen Acute W/chest  Result Date: 10/14/2018 CLINICAL DATA:  Vomiting EXAM: DG ABDOMEN ACUTE W/ 1V CHEST COMPARISON:  08/04/2017 CT abdomen/pelvis. 10/09/2014 chest radiograph. FINDINGS: Stable cardiomediastinal silhouette with normal heart size. No pneumothorax. No pleural effusion. Lungs appear clear, with no acute consolidative airspace disease and no pulmonary edema. No dilated small bowel loops or air-fluid levels. Moderate gas and mild stool throughout the colon. No evidence of pneumatosis or pneumoperitoneum. No  radiopaque nephrolithiasis. IMPRESSION: 1. No active cardiopulmonary disease. 2. Nonobstructive bowel gas pattern. Moderate gas and mild stool in the colon. Electronically Signed   By: Delbert Phenix M.D.   On: 10/14/2018 09:25    Impression: 48 year old female with history of chronic pancreatitis due to ETOH, chronic abdominal pain, prior history of pseudocysts and EUS in 2014 and most recently 2017  as noted above. Presenting with failure to thrive, worsening abdominal pain, dysphagia, N/V, striking weight loss of 52 lbs since Feb 2020. CT this admission with multiple pseudocysts, including collections encasing the EG junction and mass effect of fluid collection posteriorly of antropyloric stomach.   Dysphagia/odynophagia: noted to have wall thickening of EG junction likely secondary to fluid collections encasing the EG junction. Dysphagia and odynophagia chronic and unable to tolerate any solid foods but able to drink liquids slowly. UGI ordered by hospitalist and will review as comes available.   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 Poor nutrition, reporting only water and occasional beanie weanies. Will discuss with Dr. Darrick Penna management of pseudocysts in this setting (?EUS with cystogastrostomy?). Appreciate nutrition consultation.    Abdominal pain: lipase marginally elevated. Likely dealing with sequela of chronic pancreatitis, unable to exclude acute exacerbation. ETOH cessation about a month ago. Continue sips of clears for now, as her abdominal pain seems unchanged from her chronic baseline.   Plan: Continue PPI daily Nutrition consult pending Continue sips of clears Supportive measures Will follow-up on pending UGI previously ordered Will discuss further with Dr. Darrick Penna.    Gelene Mink, PhD, ANP-BC [facility]     LOS: 1 day    10/15/2018, 10:50 AM  Addendum:  reviewed with Dr. Darrick Penna. Will proceed with EGD 10/16/2018 with Propofol. Will review UGI as comes available. EUS in 4-6 weeks with Dr. Christella Hartigan, which we will help arrange.

## 2018-10-16 ENCOUNTER — Inpatient Hospital Stay (HOSPITAL_COMMUNITY): Payer: Medicaid Other | Admitting: Anesthesiology

## 2018-10-16 ENCOUNTER — Encounter (HOSPITAL_COMMUNITY)
Admission: EM | Disposition: A | Discharge status: Home Health | Payer: Self-pay | Source: Home / Self Care | Attending: Internal Medicine

## 2018-10-16 ENCOUNTER — Encounter (HOSPITAL_COMMUNITY): Payer: Self-pay | Admitting: *Deleted

## 2018-10-16 DIAGNOSIS — K860 Alcohol-induced chronic pancreatitis: Secondary | ICD-10-CM

## 2018-10-16 DIAGNOSIS — E43 Unspecified severe protein-calorie malnutrition: Secondary | ICD-10-CM

## 2018-10-16 HISTORY — PX: ESOPHAGOGASTRODUODENOSCOPY (EGD) WITH PROPOFOL: SHX5813

## 2018-10-16 LAB — URINE CULTURE: Culture: NO GROWTH

## 2018-10-16 SURGERY — ESOPHAGOGASTRODUODENOSCOPY (EGD) WITH PROPOFOL
Anesthesia: General

## 2018-10-16 MED ORDER — PANCRELIPASE (LIP-PROT-AMYL) 12000-38000 UNITS PO CPEP
36000.0000 [IU] | ORAL_CAPSULE | Freq: Three times a day (TID) | ORAL | Status: DC
  Administered 2018-10-16 – 2018-10-23 (×20): 36000 [IU] via ORAL
  Filled 2018-10-16: qty 3
  Filled 2018-10-16: qty 1
  Filled 2018-10-16 (×20): qty 3

## 2018-10-16 MED ORDER — PANTOPRAZOLE SODIUM 40 MG PO TBEC
40.0000 mg | DELAYED_RELEASE_TABLET | Freq: Every day | ORAL | Status: DC
  Administered 2018-10-16 – 2018-10-23 (×7): 40 mg via ORAL
  Filled 2018-10-16 (×10): qty 1

## 2018-10-16 MED ORDER — HEPARIN SODIUM (PORCINE) 5000 UNIT/ML IJ SOLN
5000.0000 [IU] | Freq: Three times a day (TID) | INTRAMUSCULAR | Status: DC
  Administered 2018-10-16 – 2018-10-23 (×20): 5000 [IU] via SUBCUTANEOUS
  Filled 2018-10-16 (×21): qty 1

## 2018-10-16 MED ORDER — PROPOFOL 10 MG/ML IV BOLUS
INTRAVENOUS | Status: AC
  Filled 2018-10-16: qty 40

## 2018-10-16 MED ORDER — NYSTATIN 100000 UNIT/ML MT SUSP
5.0000 mL | Freq: Four times a day (QID) | OROMUCOSAL | Status: DC
  Administered 2018-10-16 – 2018-10-23 (×26): 500000 [IU] via ORAL
  Filled 2018-10-16 (×28): qty 5

## 2018-10-16 MED ORDER — HYDROMORPHONE HCL 1 MG/ML IJ SOLN: 0.2500 mg | INTRAMUSCULAR | Status: DC | PRN

## 2018-10-16 MED ORDER — PROPOFOL 10 MG/ML IV BOLUS
INTRAVENOUS | Status: DC | PRN
  Administered 2018-10-16: 30 mg via INTRAVENOUS
  Administered 2018-10-16 (×2): 15 mg via INTRAVENOUS

## 2018-10-16 MED ORDER — PANCRELIPASE (LIP-PROT-AMYL) 12000-38000 UNITS PO CPEP
24000.0000 [IU] | ORAL_CAPSULE | ORAL | Status: DC
  Administered 2018-10-16 – 2018-10-22 (×13): 24000 [IU] via ORAL
  Filled 2018-10-16 (×14): qty 2

## 2018-10-16 MED ORDER — HYDROCODONE-ACETAMINOPHEN 7.5-325 MG PO TABS: 1.0000 | ORAL_TABLET | Freq: Once | ORAL | Status: DC | PRN

## 2018-10-16 MED ORDER — LACTATED RINGERS IV SOLN
INTRAVENOUS | Status: DC
  Administered 2018-10-16: 11:00:00 via INTRAVENOUS

## 2018-10-16 MED ORDER — PROPOFOL 500 MG/50ML IV EMUL
INTRAVENOUS | Status: DC | PRN
  Administered 2018-10-16: 200 ug/kg/min via INTRAVENOUS

## 2018-10-16 NOTE — Progress Notes (Signed)
Discussed findings of EGD with Dr. Jena Gauss. Changed PPI to daily by mouth. Will add back Creon enzymes, using 1000 lipase units/kg. Reassess tomorrow if need for adjusting. Full liquids started. Calorie count ordered X 48 hours and discussed with nursing. Heparin 5000 units subcutaneous has been restarted. May ultimately need enteral feeding via NJ or PEG/J.

## 2018-10-16 NOTE — Transfer of Care (Signed)
Immediate Anesthesia Transfer of Care Note  Patient: Monique Nguyen  Procedure(s) Performed: ESOPHAGOGASTRODUODENOSCOPY (EGD) WITH PROPOFOL (N/A )  Patient Location: PACU  Anesthesia Type:General  Level of Consciousness: awake, alert  and patient cooperative  Airway & Oxygen Therapy: Patient Spontanous Breathing  Post-op Assessment: Report given to RN and Post -op Vital signs reviewed and stable  Post vital signs: Reviewed and stable  Last Vitals:  Vitals Value Taken Time  BP    Temp    Pulse 108 10/16/2018 11:27 AM  Resp 14 10/16/2018 11:27 AM  SpO2 100 % 10/16/2018 11:27 AM  Vitals shown include unvalidated device data.  Last Pain:  Vitals:   10/16/18 1035  TempSrc: Oral  PainSc:       Patients Stated Pain Goal: 8 (10/16/18 1035)  Complications: No apparent anesthesia complications

## 2018-10-16 NOTE — Interval H&P Note (Signed)
History and Physical Interval Note:  10/16/2018 11:02 AM  Monique Nguyen  has presented today for surgery, with the diagnosis of dysphagia, odynophagia, GE junction thickening.  The various methods of treatment have been discussed with the patient and family. After consideration of risks, benefits and other options for treatment, the patient has consented to  Procedure(s): ESOPHAGOGASTRODUODENOSCOPY (EGD) WITH PROPOFOL (N/A) as a surgical intervention.  The patient's history has been reviewed, patient examined, no change in status, stable for surgery.  I have reviewed the patient's chart and labs.  Questions were answered to the patient's satisfaction.     Eula Listen   Patient seen in short stay.  No change.  Diagnostic EGD per plan.  No chance of possible esophageal dilation.  Will use my best clinical judgment as discussed with the patient.  The risks, benefits, limitations, alternatives and imponderables have been reviewed with the patient. Potential for esophageal dilation, biopsy, etc. have also been reviewed.  Questions have been answered. All parties agreeable.  Further recommendations to follow

## 2018-10-16 NOTE — Progress Notes (Signed)
PROGRESS NOTE    Monique Nguyen  JTT:[id number] DOB: 18-Feb-1971 DOA: 10/14/2018 PCP: Patient, No Pcp Per     Brief Narrative:  48 y.o. female with past medical history significant for tobacco abuse, depression and/anxiety, gastroesophageal reflux disease, history of alcoholic pancreatitis and psoriasis; who presented to the hospital secondary to increased abdominal pain, nausea, vomiting and inability to eat.  Patient reports that for the last month she has no being able to eat essentially anything (including having trouble even with liquids).  She has no significant amount of weight and expressed increased abdominal pain.  Pain is localized in her epigastric area/mid section, radiated to the left side of back, 10 out of 10 in intensity, no alleviating factors, associated with anorexia, nausea and intermittent episodes of vomiting (there has not been any blood seen in the vomit content). Patient denies any fever, chills, chest pain, shortness of breath, hematuria, dysuria, melena, hematochezia, focal weakness or any other complaints.  There has not been any sick contacts or recent travels.  Assessment & Plan: 1-acute on chronic pancreatitis (HCC) -Continue as needed pain medication, antiemetics and supportive care -Continue IV fluids -CT scan done yesterday demonstrated development of pseudocyst creating pressure in her esophagus and most likely being responsible for chronic abdominal pain and difficulty swallowing. -GI planning endoscopy later today 10/16/2018.  Will follow further recommendations.  2-Depression/anxiety -Will need outpatient follow-up with psychiatry service -No suicidal ideation or hallucination -Patient reports no using any antidepressant prior to admission. -Very anxious today, will use low-dose anxiolytic medication to assist with her mood.  3-Hypokalemia -Improving -Continue following electrolytes trend and further replete as needed -Given the stability will  discontinue telemetry.  4-Adult failure to thrive/severe protein calorie malnutrition -Will follow recommendations by dietitian regarding feeding supplements. -Most likely associated with chronic pancreatitis -Once able to tolerate by mouth will recommend the use of Creon. -Body mass index is 15.29 kg/m. -Depending on findings and further recommendation by GI patient might require the use of a PEG tube to ensure nutrition.  5-GERD (gastroesophageal reflux disease) -Continue PPI  6-Dehydration -Continue IV fluids and supportive care.   7-Dysphagia -EGD is planned for tomorrow 10/16/2018 by gastroenterology service. -Continue PPI. -If all related to pancreatic pseudocyst patient may end requiring a PEG tube to ensure proper nutrition.  8-tobacco abuse -Cessation counseling has been provided -Continue nicotine patch.  9-thrush -Will treat with nystatin  DVT prophylaxis: Heparin Code Status: Full code Family Communication: No family at bedside. Disposition Plan: Remains in the hospital, continue IV fluids and electrolytes repletion, follow GI service recommendations (with anticipated EGD later today 10/16/2018).  Continue IV PPI.  Consultants:   Gastroenterology  Procedures:   See below for x-ray reports.  Antimicrobials:  Anti-infectives (From admission, onward)   None     Subjective: Afebrile, no vomiting, no shortness of breath, no chest pain.  Continued reporting intermittent abdominal discomfort and nausea.  Positive thrush appreciated inside her mouth.  Objective: Vitals:   10/15/18 2106 10/16/18 0523 10/16/18 1030 10/16/18 1035  BP: 105/68 106/74 107/72   Pulse: (!) 114 (!) 106 (!) 105   Resp: 16 18 19    Temp: 98.4 F (36.9 C) 97.6 F (36.4 C) 97.8 F (36.6 C)   TempSrc: Oral Oral Oral Oral  SpO2: 100% 100%    Weight:      Height:        Intake/Output Summary (Last 24 hours) at 10/16/2018 1047 Last data filed at 10/16/2018 0855 Gross per 24 hour  Intake  2717.9 ml  Output 1200 ml  Net 1517.9 ml   Filed Weights   10/14/18 0759  Weight: 36.7 kg    Examination: General exam: Alert, awake, oriented x 3, reports he still feeling nauseated, with intermittent abdominal discomfort.  No chest pain, no vomiting, no shortness of breath.  Overall expressed that the pain medications are assist in controlling her abdominal discomfort.  Patient chronically ill in appearance and cachectic on exam.  Thrush appreciated in her tongue. Respiratory system: Clear to auscultation. Respiratory effort normal. Cardiovascular system:RRR. No murmurs, rubs, gallops. Gastrointestinal system: Abdomen is nondistended, soft and tender to palpation in epigastric region and mid abdomen.  Mild guarding appreciated.  Positive bowel sounds.  Central nervous system: Alert and oriented. No focal neurological deficits. Extremities: No C/C/E, +pedal pulses Skin: No rashes, lesions or ulcers Psychiatry: Judgement and insight appear normal.    Data Reviewed: I have personally reviewed following labs and imaging studies  CBC: Recent Labs  Lab 10/14/18 0818 10/15/18 0506  WBC 7.1 4.3  NEUTROABS 5.8  --   HGB 13.4 9.1*  HCT 37.4 26.5*  MCV 91.2 94.3  PLT 579* 287   Basic Metabolic Panel: Recent Labs  Lab 10/14/18 0818 10/14/18 1033 10/15/18 0506  NA 134*  --  137  K 2.2*  --  3.2*  CL 98  --  109  CO2 19*  --  22  GLUCOSE 146*  --  177*  BUN 53*  --  30*  CREATININE 0.73  --  0.43*  CALCIUM 9.9  --  7.7*  MG 2.5*  --   --   PHOS  --  1.8*  --    GFR: Estimated Creatinine Clearance: 50.4 mL/min (A) (by C-G formula based on SCr of 0.43 mg/dL (L)).   Liver Function Tests: Recent Labs  Lab 10/14/18 0818 10/15/18 0506  AST 59* 41  ALT 33 27  ALKPHOS 154* 101  BILITOT 1.5* 0.4  PROT 8.5* 5.3*  ALBUMIN 4.1 2.4*   Recent Labs  Lab 10/14/18 0818  LIPASE 76*   Thyroid Function Tests: Recent Labs    10/14/18 0818  TSH 0.978   Anemia Panel: Recent  Labs    10/14/18 1032  VITAMINB12 654   Urine analysis:    Component Value Date/Time   COLORURINE AMBER (A) 10/14/2018 0810   APPEARANCEUR CLEAR 10/14/2018 0810   LABSPEC 1.018 10/14/2018 0810   PHURINE 6.0 10/14/2018 0810   GLUCOSEU NEGATIVE 10/14/2018 0810   HGBUR SMALL (A) 10/14/2018 0810   BILIRUBINUR NEGATIVE 10/14/2018 0810   KETONESUR 20 (A) 10/14/2018 0810   PROTEINUR 100 (A) 10/14/2018 0810   UROBILINOGEN 0.2 03/06/2015 1558   NITRITE POSITIVE (A) 10/14/2018 0810   LEUKOCYTESUR NEGATIVE 10/14/2018 0810    Recent Results (from the past 240 hour(s))  SARS Coronavirus 2 (CEPHEID - Performed in [facility] hospital lab), Hosp Order     Status: None   Collection Time: 10/14/18  8:11 AM  Result Value Ref Range Status   SARS Coronavirus 2 NEGATIVE NEGATIVE Final    Comment: (NOTE) If result is NEGATIVE SARS-CoV-2 target nucleic acids are NOT DETECTED. The SARS-CoV-2 RNA is generally detectable in upper and lower  respiratory specimens during the acute phase of infection. The lowest  concentration of SARS-CoV-2 viral copies this assay can detect is 250  copies / mL. A negative result does not preclude SARS-CoV-2 infection  and should not be used as the sole basis for treatment or other  patient management decisions.  A negative result may occur with  improper specimen collection / handling, submission of specimen other  than nasopharyngeal swab, presence of viral mutation(s) within the  areas targeted by this assay, and inadequate number of viral copies  (<250 copies / mL). A negative result must be combined with clinical  observations, patient history, and epidemiological information. If result is POSITIVE SARS-CoV-2 target nucleic acids are DETECTED. The SARS-CoV-2 RNA is generally detectable in upper and lower  respiratory specimens dur ing the acute phase of infection.  Positive  results are indicative of active infection with SARS-CoV-2.  Clinical  correlation  with patient history and other diagnostic information is  necessary to determine patient infection status.  Positive results do  not rule out bacterial infection or co-infection with other viruses. If result is PRESUMPTIVE POSTIVE SARS-CoV-2 nucleic acids MAY BE PRESENT.   A presumptive positive result was obtained on the submitted specimen  and confirmed on repeat testing.  While 2019 novel coronavirus  (SARS-CoV-2) nucleic acids may be present in the submitted sample  additional confirmatory testing may be necessary for epidemiological  and / or clinical management purposes  to differentiate between  SARS-CoV-2 and other Sarbecovirus currently known to infect humans.  If clinically indicated additional testing with an alternate test  methodology [phone]) is advised. The SARS-CoV-2 RNA is generally  detectable in upper and lower respiratory sp ecimens during the acute  phase of infection. The expected result is Negative. Fact Sheet for Patients:  BoilerBrush.com.cy Fact Sheet for Healthcare Providers: https://pope.com/ This test is not yet approved or cleared by the [place] FDA and has been authorized for detection and/or diagnosis of SARS-CoV-2 by FDA under an Emergency Use Authorization (EUA).  This EUA will remain in effect (meaning this test can be used) for the duration of the COVID-19 declaration under Section 564(b)(1) of the Act, 21 U.S.C. section 360bbb-3(b)(1), unless the authorization is terminated or revoked sooner. Performed at [facility], [address], [place], [place] [address]   Culture, Urine     Status: None   Collection Time: 10/14/18  5:23 PM  Result Value Ref Range Status   Specimen Description   Final    URINE, CLEAN CATCH Performed at [facility], [address], [place], [place] [address]    Special Requests   Final    NONE Performed at [facility], [address] [place], [place] [address]     Culture   Final    NO GROWTH Performed at [facility], [address] [place], [place] [address]    Report Status 10/16/2018 FINAL  Final     Radiology Studies: Ct Abdomen Pelvis W Contrast  Result Date: 10/14/2018 CLINICAL DATA:  Inpatient. Failure to thrive. Chronic abdominal pain. Difficulty swallowing with sensation of food sticking in throat. Weight loss. History of pancreatitis. EXAM: CT ABDOMEN AND PELVIS WITH CONTRAST TECHNIQUE: Multidetector CT imaging of the abdomen and pelvis was performed using the standard protocol following bolus administration of intravenous contrast. CONTRAST:  30mL OMNIPAQUE IOHEXOL 300 MG/ML SOLN, 75mL OMNIPAQUE IOHEXOL 300 MG/ML SOLN COMPARISON:  10/14/2018 abdominal radiographs. 08/04/2017 CT abdomen/pelvis. FINDINGS: Motion degraded scan, partially limiting assessment. Lower chest: Right middle lobe 2 mm solid pulmonary nodule (series 5/image 1), not previously imaged. Additional tiny 2 mm pulmonary nodules at both lung bases are unchanged from 2017 CT, considered benign. Irregular thick walled 2.5 x 1.7 x 3.2 cm fluid collection along the anterior margin of the lower thoracic  esophagus (series 2/image 11), new since 08/04/2017 CT. Similar irregular thick walled curvilinear fluid collection posterior to the esophagogastric junction extending into the right retrocrural region measuring 3.2 x 1.5 x 4.1 cm (series 2/image 17), new. Hepatobiliary: Normal liver size. No liver mass. Normal gallbladder with no radiopaque cholelithiasis. No biliary ductal dilatation. CBD diameter 5 mm. Pancreas: Chronic atrophy of distal body and tail of the pancreas with associated pancreatic duct dilation in these pancreatic segments, compatible with chronic pancreatitis. New thick-walled 4.1 x 2.9 x 3.5 cm pancreatic head cystic lesion (series 2/image 26). Posterior pancreatic head 1.0 x 0.8 cm cystic lesion (series 2/image 28), mildly increased from 0.7 x 0.6 cm. New clustered  cystic pancreatic lesions in the pancreatic body, largest 2.4 x 1.7 cm anteriorly (series 2/image 21). No significant acute peripancreatic fat stranding. Spleen: Normal size. No mass. Adrenals/Urinary Tract: Normal adrenals. Simple 1.1 cm upper left renal cyst. Otherwise normal kidneys, with no hydronephrosis. Chronic mild diffuse bladder wall thickening with moderate bladder distention. Stomach/Bowel: Mild wall thickening throughout the esophagogastric junction. Irregular thick walled fluid collection along the posterior margin of the antropyloric stomach measuring approximately 3.7 x 2.0 x 5.1 cm (series 2/image 31), new, exerting mild extrinsic mass-effect on the distal stomach. Stomach is not significantly distended. Normal caliber small bowel with no small bowel wall thickening. Oral contrast transits to the distal small bowel. Normal appendix. Normal large bowel with no diverticulosis, large bowel wall thickening or pericolonic fat stranding. Vascular/Lymphatic: Atherosclerotic nonaneurysmal abdominal aorta. Patent hepatic, portal and renal veins. Diminutive splenic vein, which remains patent, unchanged. No pathologically enlarged lymph nodes in the abdomen or pelvis. New ill-defined curvilinear soft tissue in the central mesentery, which appears to communicate with the posterior distal gastric fluid collection. Reproductive: Grossly normal uterus. No discrete adnexal mass. Small volume hemoperitoneum in the pelvic cul-de-sac. Other: No pneumoperitoneum.  No abdominal ascites. Musculoskeletal: No aggressive appearing focal osseous lesions. IMPRESSION: 1. 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 Redemonstrated findings of chronic pancreatitis in the distal body and tail of the pancreas. 2. New irregular thick walled fluid collections encasing the esophagogastric junction anteriorly and posteriorly with  associated wall thickening throughout the esophagogastric junction. Similar new irregular thick walled fluid collection along the posterior margin of the antropyloric stomach with some mass effect on the distal stomach, which is not significantly distended. These nonspecific findings also presumably represent pancreatic pseudocysts with reactive wall thickening at the esophagogastric junction and distal stomach. Infected collections cannot be excluded. 3. Close CT or MRI abdomen follow-up imaging advised. 4. No biliary ductal dilatation.  No radiopaque cholelithiasis. 5. Nonspecific small volume hemoperitoneum in the pelvic cul-de-sac. No discrete adnexal mass. Suggest follow-up pelvic ultrasound on a short-term basis when clinically feasible. 6. Nonspecific chronic diffuse bladder wall thickening. Moderate bladder distention. No hydronephrosis. 7. Right middle lobe 2 mm solid pulmonary nodule, not previously imaged. No follow-up needed if patient is low-risk. Non-contrast chest CT can be considered in 12 months if patient is high-risk. This recommendation follows the consensus statement: Guidelines for Management of Incidental Pulmonary Nodules Detected on CT Images:From the Fleischner Society 2017; published online before print (10.1148/radiol.[id number]). 8.  Aortic Atherosclerosis (ICD10-I70.0). Electronically Signed   By: Delbert Phenix M.D.   On: 10/14/2018 12:49   Dg Esophagus W Single Cm (sol Or Thin Ba)  Result Date: 10/15/2018 CLINICAL DATA:  Dysphagia, lower esophageal and epigastric pain EXAM: ESOPHOGRAM / BARIUM  SWALLOW / BARIUM TABLET STUDY TECHNIQUE: Combined double contrast and single contrast examination performed using effervescent crystals, thick barium liquid, and thin barium liquid. The patient was observed with fluoroscopy swallowing a 13 mm barium sulphate tablet. FLUOROSCOPY TIME:  Fluoroscopy Time:  1 minutes 30 seconds Radiation Exposure Index (if provided by the fluoroscopic device): 17.9  mGy Number of Acquired Spot Images: multiple fluoroscopic screen captures COMPARISON:  None FINDINGS: Esophageal distention: Normal without mass or stricture Filling defects:  None 12.5 mm barium tablet: Easily passed from oral cavity to stomach without obstruction. Motility:  Normal Mucosa:  Smooth without irregularity or ulceration Hypopharynx/cervical esophagus: Normal motion without laryngeal penetration or aspiration Hiatal hernia:  Absent GE reflux:  Not identified during exam Other:  N/A IMPRESSION: Unremarkable esophagram. Electronically Signed   By: Ulyses SouthwardMark  Boles M.D.   On: 10/15/2018 15:06    Scheduled Meds:  [MAR Hold] feeding supplement  1 Container Oral TID BM   [MAR Hold] feeding supplement (PRO-STAT SUGAR FREE 64)  30 mL Oral TID   [MAR Hold] folic acid  1 mg Intravenous Daily   [MAR Hold] multivitamin  15 mL Oral Daily   [MAR Hold] nicotine  21 mg Transdermal Daily   [MAR Hold] pantoprazole (PROTONIX) IV  40 mg Intravenous BID AC   [MAR Hold] thiamine injection  100 mg Intravenous Daily   Continuous Infusions:  sodium chloride     dextrose 5 % and 0.9 % NaCl with KCl 40 mEq/L 100 mL/hr at 10/16/18 0500   lactated ringers 50 mL/hr at 10/16/18 1044     LOS: 2 days    Time spent: 30 minutes.   Vassie Lollarlos Rayquon Uselman, MD [facility] Pager [phone]   10/16/2018, 10:47 AM

## 2018-10-16 NOTE — H&P (View-Only) (Signed)
Subjective: Still with abdominal pain but slight improvement since admission. Aware of EGD for today. NPO since midnight except meds.   Objective: Vital signs in last 24 hours: Temp:  [97.6 F (36.4 C)-98.4 F (36.9 C)] 97.6 F (36.4 C) (05/05 0523) Pulse Rate:  [106-114] 106 (05/05 0523) Resp:  [16-20] 18 (05/05 0523) BP: (92-106)/(68-74) 106/74 (05/05 0523) SpO2:  [100 %] 100 % (05/05 0523) Last BM Date: 10/09/18 General:   Alert and oriented, flat affect, appears older than stated age, chronically ill appearing Mouth: whitish/yellowish coating on tongue but no obvious lesions on buccal mucosa or back of throat. Exam limited as patient unable to open mouth very wide despite multiple maneuevers Abdomen:  Bowel sounds present, soft, TTP upper abdomen but slightly improved from yesterday, less TTP LLQ/RLQ. No guarding.  Psych:  Cooperative, appears less tearful or anxious compared to yesterday  Intake/Output from previous day: 05/04 0701 - 05/05 0700 In: 2957.9 [P.O.:840; I.V.:2117.9] Out: 1300 [Urine:1300] Intake/Output this shift: No intake/output data recorded.  Lab Results: Recent Labs    10/14/18 0818 10/15/18 0506  WBC 7.1 4.3  HGB 13.4 9.1*  HCT 37.4 26.5*  PLT 579* 287   BMET Recent Labs    10/14/18 0818 10/15/18 0506  NA 134* 137  K 2.2* 3.2*  CL 98 109  CO2 19* 22  GLUCOSE 146* 177*  BUN 53* 30*  CREATININE 0.73 0.43*  CALCIUM 9.9 7.7*   LFT Recent Labs    10/14/18 0818 10/15/18 0506  PROT 8.5* 5.3*  ALBUMIN 4.1 2.4*  AST 59* 41  ALT 33 27  ALKPHOS 154* 101  BILITOT 1.5* 0.4    Studies/Results: Ct Abdomen Pelvis W Contrast  Result Date: 10/14/2018 CLINICAL DATA:  Inpatient. Failure to thrive. Chronic abdominal pain. Difficulty swallowing with sensation of food sticking in throat. Weight loss. History of pancreatitis. EXAM: CT ABDOMEN AND PELVIS WITH CONTRAST TECHNIQUE: Multidetector CT imaging of the abdomen and pelvis was performed  using the standard protocol following bolus administration of intravenous contrast. CONTRAST:  22mL OMNIPAQUE IOHEXOL 300 MG/ML SOLN, 30mL OMNIPAQUE IOHEXOL 300 MG/ML SOLN COMPARISON:  10/14/2018 abdominal radiographs. 08/04/2017 CT abdomen/pelvis. FINDINGS: Motion degraded scan, partially limiting assessment. Lower chest: Right middle lobe 2 mm solid pulmonary nodule (series 5/image 1), not previously imaged. Additional tiny 2 mm pulmonary nodules at both lung bases are unchanged from 2017 CT, considered benign. Irregular thick walled 2.5 x 1.7 x 3.2 cm fluid collection along the anterior margin of the lower thoracic esophagus (series 2/image 11), new since 08/04/2017 CT. Similar irregular thick walled curvilinear fluid collection posterior to the esophagogastric junction extending into the right retrocrural region measuring 3.2 x 1.5 x 4.1 cm (series 2/image 17), new. Hepatobiliary: Normal liver size. No liver mass. Normal gallbladder with no radiopaque cholelithiasis. No biliary ductal dilatation. CBD diameter 5 mm. Pancreas: Chronic atrophy of distal body and tail of the pancreas with associated pancreatic duct dilation in these pancreatic segments, compatible with chronic pancreatitis. New thick-walled 4.1 x 2.9 x 3.5 cm pancreatic head cystic lesion (series 2/image 26). Posterior pancreatic head 1.0 x 0.8 cm cystic lesion (series 2/image 28), mildly increased from 0.7 x 0.6 cm. New clustered cystic pancreatic lesions in the pancreatic body, largest 2.4 x 1.7 cm anteriorly (series 2/image 21). No significant acute peripancreatic fat stranding. Spleen: Normal size. No mass. Adrenals/Urinary Tract: Normal adrenals. Simple 1.1 cm upper left renal cyst. Otherwise normal kidneys, with no hydronephrosis. Chronic mild diffuse bladder wall thickening with  moderate bladder distention. Stomach/Bowel: Mild wall thickening throughout the esophagogastric junction. Irregular thick walled fluid collection along the posterior  margin of the antropyloric stomach measuring approximately 3.7 x 2.0 x 5.1 cm (series 2/image 31), new, exerting mild extrinsic mass-effect on the distal stomach. Stomach is not significantly distended. Normal caliber small bowel with no small bowel wall thickening. Oral contrast transits to the distal small bowel. Normal appendix. Normal large bowel with no diverticulosis, large bowel wall thickening or pericolonic fat stranding. Vascular/Lymphatic: Atherosclerotic nonaneurysmal abdominal aorta. Patent hepatic, portal and renal veins. Diminutive splenic vein, which remains patent, unchanged. No pathologically enlarged lymph nodes in the abdomen or pelvis. New ill-defined curvilinear soft tissue in the central mesentery, which appears to communicate with the posterior distal gastric fluid collection. Reproductive: Grossly normal uterus. No discrete adnexal mass. Small volume hemoperitoneum in the pelvic cul-de-sac. Other: No pneumoperitoneum.  No abdominal ascites. Musculoskeletal: No aggressive appearing focal osseous lesions. IMPRESSION: 1. 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 Redemonstrated findings of chronic pancreatitis in the distal body and tail of the pancreas. 2. New irregular thick walled fluid collections encasing the esophagogastric junction anteriorly and posteriorly with associated wall thickening throughout the esophagogastric junction. Similar new irregular thick walled fluid collection along the posterior margin of the antropyloric stomach with some mass effect on the distal stomach, which is not significantly distended. These nonspecific findings also presumably represent pancreatic pseudocysts with reactive wall thickening at the esophagogastric junction and distal stomach. Infected collections cannot be excluded. 3. Close CT or MRI abdomen follow-up imaging advised. 4. No biliary ductal  dilatation.  No radiopaque cholelithiasis. 5. Nonspecific small volume hemoperitoneum in the pelvic cul-de-sac. No discrete adnexal mass. Suggest follow-up pelvic ultrasound on a short-term basis when clinically feasible. 6. Nonspecific chronic diffuse bladder wall thickening. Moderate bladder distention. No hydronephrosis. 7. Right middle lobe 2 mm solid pulmonary nodule, not previously imaged. No follow-up needed if patient is low-risk. Non-contrast chest CT can be considered in 12 months if patient is high-risk. This recommendation follows the consensus statement: Guidelines for Management of Incidental Pulmonary Nodules Detected on CT Images:From the Fleischner Society 2017; published online before print (10.1148/radiol.[id number]). 8.  Aortic Atherosclerosis (ICD10-I70.0). Electronically Signed   By: Delbert Phenix M.D.   On: 10/14/2018 12:49   Dg Abdomen Acute W/chest  Result Date: 10/14/2018 CLINICAL DATA:  Vomiting EXAM: DG ABDOMEN ACUTE W/ 1V CHEST COMPARISON:  08/04/2017 CT abdomen/pelvis. 10/09/2014 chest radiograph. FINDINGS: Stable cardiomediastinal silhouette with normal heart size. No pneumothorax. No pleural effusion. Lungs appear clear, with no acute consolidative airspace disease and no pulmonary edema. No dilated small bowel loops or air-fluid levels. Moderate gas and mild stool throughout the colon. No evidence of pneumatosis or pneumoperitoneum. No radiopaque nephrolithiasis. IMPRESSION: 1. No active cardiopulmonary disease. 2. Nonobstructive bowel gas pattern. Moderate gas and mild stool in the colon. Electronically Signed   By: Delbert Phenix M.D.   On: 10/14/2018 09:25   Dg Esophagus W Single Cm (sol Or Thin Ba)  Result Date: 10/15/2018 CLINICAL DATA:  Dysphagia, lower esophageal and epigastric pain EXAM: ESOPHOGRAM / BARIUM SWALLOW / BARIUM TABLET STUDY TECHNIQUE: Combined double contrast and single contrast examination performed using effervescent crystals, thick barium liquid, and thin  barium liquid. The patient was observed with fluoroscopy swallowing a 13 mm barium sulphate tablet. FLUOROSCOPY TIME:  Fluoroscopy Time:  1 minutes 30 seconds Radiation Exposure Index (if provided by the fluoroscopic  device): 17.9 mGy Number of Acquired Spot Images: multiple fluoroscopic screen captures COMPARISON:  None FINDINGS: Esophageal distention: Normal without mass or stricture Filling defects:  None 12.5 mm barium tablet: Easily passed from oral cavity to stomach without obstruction. Motility:  Normal Mucosa:  Smooth without irregularity or ulceration Hypopharynx/cervical esophagus: Normal motion without laryngeal penetration or aspiration Hiatal hernia:  Absent GE reflux:  Not identified during exam Other:  N/A IMPRESSION: Unremarkable esophagram. Electronically Signed   By: Ulyses Southward M.D.   On: 10/15/2018 15:06    Assessment: 48 year old female with history of chronic pancreatitis due to ETOH, chronic abdominal pain, prior history of pseudocysts and EUS in 2014 and most recently 2017 as noted in consultation note from 5/4. Presented with failure to thrive, worsening abdominal pain, dysphagia, N/V, striking weight loss of 52 lbs since Feb 2020. CT this admission with multiple pseudocysts, with some concern for collections encasing the EG junction and possible mass effect of fluid collection posteriorly to antropyloric stomach. Dr. Darrick Penna reviewed with radiologist on 5/4 (see consultation addendum) and radiologist feels fluid collection at esophagus is not clinically significant but does have definite esophageal wall abnormality, no perforation. Mass effect on stomach not resulting in gastric outlet obstruction and will need outpatient EUS in 4-6 weeks with Dr. Christella Hartigan.   Weight loss:appreciate Nutrition involvement. Consideration for PEG if unable to tolerate Breeze or advancement of diet.   Abdominal pain: lipase marginally elevated. Likely dealing with sequela of chronic pancreatitis, unable  to exclude acute exacerbation. ETOH cessation about a month ago. EUS as planned as outpatient  Dysphagia/odynophagia: noted to have wall thickening of EG junction, with chronic symptoms. Only tolerating liquids. UGI completed yesterday, ordered by hospitalist, which is unremarkable without obvious stricture. No dysmotility.  Plans for diagnostic EGD to assess esophageal wall abnormality with Propofol today by Dr. Jena Gauss. Per radiologist recommendations, pass through EGJ with caution during endoscopy. She has been NPO except sips with meds. Heparin 5,000 subcutaneous given around 0500 but discontinued now. Differentials include severe esophagitis, HSV or CMV esophagitis, malignancy. Due to Heparin received at 0500, will postpone EGD until 11 am today with Propofol. No dilatation planned due to inflamed EG junction. Possibility of candida esophagitis as tongue with possible thrush; patient now stating she has noticed this at home for awhile and unable to improve despite oral hygiene. She also notes inability to take creon or PPI consistently due to nausea, abdominal pain. Difficult historian.  Plan: Remain NPO Continue IV PPI BID Heparin has been discontinued EGD with Propofol today by Dr. Jena Gauss. Patient aware of risks and benefits with desire to proceed.  EUS as outpatient with Dr. Christella Hartigan in 4-6 weeks   Gelene Mink, PhD, ANP-BC [facility]    LOS: 2 days    10/16/2018, 8:11 AM  Attending note.  CT images reviewed.  I re-reviewed the CT with Dr. Myles Rosenthal this morning.  He feels that all cysts are related to pancreas (including abnormal, extrinsic fluid collection at GE junction).  White tongue noted may or may not be related to thrush. All things considered, I do agree with diagnostic EGD to rule out an occult coexisting mucosal process in her upper GI tract.  It may come down to her failure to thrive related to acute on chronic pancreatitis with multiple pseudocysts  formation. May ultimately need enteral tube feedings.  An NJ tube would be fraught with problems in this clinical scenario..  A PEG with PEJ extension would be best approach.Marland Kitchen  Before discussing that avenue of therapy further, will perform an EGD at 11 AM today.  Discussed with anesthesia.  Further recommendations to follow.

## 2018-10-16 NOTE — Op Note (Signed)
[facility] Patient Name: Monique Nguyen Procedure Date: 10/16/2018 11:11 AM MRN: [id number] Date of Birth: 1971-02-22 Attending MD: Monique Pac , MD CSN: [id number] Age: 48 Admit Type: Inpatient Procedure:                Upper GI endoscopy Indications:              Dysphagia, Odynophagia Providers:                Monique Pac, MD, Judee Clara, RN, Edythe Clarity, Technician Referring MD:              Medicines:                Propofol per Anesthesia Complications:            No immediate complications. Estimated Blood Loss:     Estimated blood loss: none. Procedure:                Pre-Anesthesia Assessment:                           - Prior to the procedure, a History and Physical                            was performed, and patient medications and                            allergies were reviewed. The patient's tolerance of                            previous anesthesia was also reviewed. The risks                            and benefits of the procedure and the sedation                            options and risks were discussed with the patient.                            All questions were answered, and informed consent                            was obtained. Prior Anticoagulants: The patient                            last took heparin on the day of the procedure. ASA                            Grade Assessment: III - A patient with severe                            systemic disease. After reviewing the risks and  benefits, the patient was deemed in satisfactory                            condition to undergo the procedure.                           After obtaining informed consent, the endoscope was                            passed under direct vision. Throughout the                            procedure, the patient's blood pressure, pulse, and                            oxygen saturations were  monitored continuously. The                            [id number]-H190 ([id number]) scope was introduced through the                            mouth, and advanced to the second part of duodenum.                            The upper GI endoscopy was accomplished without                            difficulty. The patient tolerated the procedure                            well. Scope In: 11:14:50 AM Scope Out: 11:19:26 AM Total Procedure Duration: 0 hours 4 minutes 36 seconds  Findings:      The examined esophagus was NORMAL.      The entire examined stomach was normal aside from minimal pre-pyloric /       antral deformity. No infiltrating process seen..      The duodenal bulb and second portion of the duodenum were normal. Impression:               - Normal esophagus.                           - Normal stomach(minimally deformed antrum                            -nonspecific)                           - Normal duodenal bulb and second portion of the                            duodenum.                           - No specimens collected.  I suspect her difficulties taking in nutrition are                            related to complicated, acute on chronic                            pancreatitis (pseudocyst formation) Moderate Sedation:      Moderate (conscious) sedation was personally administered by an       anesthesia professional. The following parameters were monitored: oxygen       saturation, heart rate, blood pressure, respiratory rate, EKG, adequacy       of pulmonary ventilation, and response to care. Recommendation:           - Return patient to hospital ward for ongoing care.                           - Full liquid diet. Calorie count x48 hours; if                            inadequate, trial of NJ feedings. If nasal tube is                            problematic would consider PEG placement with PEJ                            extension (IR).                            - Continue present medications including once daily                            PPI and pancreatic enzyme supplements.. Procedure Code(s):        --- Professional ---                           [phone], Esophagogastroduodenoscopy, flexible,                            transoral; diagnostic, including collection of                            specimen(s) by brushing or washing, when performed                            (separate procedure) Diagnosis Code(s):        --- Professional ---                           R13.10, Dysphagia, unspecified CPT copyright 2019 American Medical Association. All rights reserved. The codes documented in this report are preliminary and upon coder review may  be revised to meet current compliance requirements. Monique Friendsobert M. Anneth Brunell, MD Monique Pacobert Michael Thurma Priego, MD 10/16/2018 11:43:35 AM This report has been signed electronically. Number of Addenda: 0

## 2018-10-16 NOTE — Anesthesia Preprocedure Evaluation (Signed)
Anesthesia Evaluation  Patient identified by MRN, date of birth, ID band Patient awake    Reviewed: Allergy & Precautions, NPO status , Patient's Chart, lab work & pertinent test results  Airway Mallampati: II  TM Distance: >3 FB Neck ROM: Full    Dental no notable dental hx. (+) Missing, Poor Dentition, Chipped   Pulmonary neg pulmonary ROS, Current Smoker,    Pulmonary exam normal breath sounds clear to auscultation       Cardiovascular Exercise Tolerance: Good + angina at rest Normal cardiovascular examI Rhythm:Regular Rate:Normal     Neuro/Psych Anxiety Depression negative neurological ROS  negative psych ROS   GI/Hepatic Neg liver ROS, GERD  Medicated and Controlled,Here with pancreatitis, with abnl finding on CT scan -here for EGD today. C/O CP/SOB at rest -appears in NAD   Endo/Other  negative endocrine ROS  Renal/GU negative Renal ROS  negative genitourinary   Musculoskeletal negative musculoskeletal ROS (+)   Abdominal   Peds negative pediatric ROS (+)  Hematology negative hematology ROS (+)   Anesthesia Other Findings   Reproductive/Obstetrics negative OB ROS                             Anesthesia Physical Anesthesia Plan  ASA: III  Anesthesia Plan: General   Post-op Pain Management:    Induction: Intravenous  PONV Risk Score and Plan:   Airway Management Planned: Nasal Cannula and Simple Face Mask  Additional Equipment:   Intra-op Plan:   Post-operative Plan:   Informed Consent: I have reviewed the patients History and Physical, chart, labs and discussed the procedure including the risks, benefits and alternatives for the proposed anesthesia with the patient or authorized representative who has indicated his/her understanding and acceptance.     Dental advisory given  Plan Discussed with: CRNA  Anesthesia Plan Comments: (Full PPE use planned  GA planned  with GETA back up as needed d/w PT- WTP same after Q&A)        Anesthesia Quick Evaluation

## 2018-10-16 NOTE — Anesthesia Postprocedure Evaluation (Signed)
Anesthesia Post Note  Patient: Monique Nguyen  Procedure(s) Performed: ESOPHAGOGASTRODUODENOSCOPY (EGD) WITH PROPOFOL (N/A )  Patient location during evaluation: PACU Anesthesia Type: General Level of consciousness: awake and alert Pain management: satisfactory to patient Vital Signs Assessment: post-procedure vital signs reviewed and stable Respiratory status: spontaneous breathing Cardiovascular status: stable Postop Assessment: no apparent nausea or vomiting Anesthetic complications: no     Last Vitals:  Vitals:   10/16/18 1130 10/16/18 1156  BP: 97/69 97/73  Pulse: (!) 110 76  Resp: 16 20  Temp: 36.7 C 36.8 C  SpO2: 100% 93%    Last Pain:  Vitals:   10/16/18 1130  TempSrc:   PainSc: 0-No pain                 Delanna Blacketer

## 2018-10-16 NOTE — Progress Notes (Addendum)
Subjective: Still with abdominal pain but slight improvement since admission. Aware of EGD for today. NPO since midnight except meds.   Objective: Vital signs in last 24 hours: Temp:  [97.6 F (36.4 C)-98.4 F (36.9 C)] 97.6 F (36.4 C) (05/05 0523) Pulse Rate:  [106-114] 106 (05/05 0523) Resp:  [16-20] 18 (05/05 0523) BP: (92-106)/(68-74) 106/74 (05/05 0523) SpO2:  [100 %] 100 % (05/05 0523) Last BM Date: 10/09/18 General:   Alert and oriented, flat affect, appears older than stated age, chronically ill appearing Mouth: whitish/yellowish coating on tongue but no obvious lesions on buccal mucosa or back of throat. Exam limited as patient unable to open mouth very wide despite multiple maneuevers Abdomen:  Bowel sounds present, soft, TTP upper abdomen but slightly improved from yesterday, less TTP LLQ/RLQ. No guarding.  Psych:  Cooperative, appears less tearful or anxious compared to yesterday  Intake/Output from previous day: 05/04 0701 - 05/05 0700 In: 2957.9 [P.O.:840; I.V.:2117.9] Out: 1300 [Urine:1300] Intake/Output this shift: No intake/output data recorded.  Lab Results: Recent Labs    10/14/18 0818 10/15/18 0506  WBC 7.1 4.3  HGB 13.4 9.1*  HCT 37.4 26.5*  PLT 579* 287   BMET Recent Labs    10/14/18 0818 10/15/18 0506  NA 134* 137  K 2.2* 3.2*  CL 98 109  CO2 19* 22  GLUCOSE 146* 177*  BUN 53* 30*  CREATININE 0.73 0.43*  CALCIUM 9.9 7.7*   LFT Recent Labs    10/14/18 0818 10/15/18 0506  PROT 8.5* 5.3*  ALBUMIN 4.1 2.4*  AST 59* 41  ALT 33 27  ALKPHOS 154* 101  BILITOT 1.5* 0.4    Studies/Results: Ct Abdomen Pelvis W Contrast  Result Date: 10/14/2018 CLINICAL DATA:  Inpatient. Failure to thrive. Chronic abdominal pain. Difficulty swallowing with sensation of food sticking in throat. Weight loss. History of pancreatitis. EXAM: CT ABDOMEN AND PELVIS WITH CONTRAST TECHNIQUE: Multidetector CT imaging of the abdomen and pelvis was performed  using the standard protocol following bolus administration of intravenous contrast. CONTRAST:  22mL OMNIPAQUE IOHEXOL 300 MG/ML SOLN, 30mL OMNIPAQUE IOHEXOL 300 MG/ML SOLN COMPARISON:  10/14/2018 abdominal radiographs. 08/04/2017 CT abdomen/pelvis. FINDINGS: Motion degraded scan, partially limiting assessment. Lower chest: Right middle lobe 2 mm solid pulmonary nodule (series 5/image 1), not previously imaged. Additional tiny 2 mm pulmonary nodules at both lung bases are unchanged from 2017 CT, considered benign. Irregular thick walled 2.5 x 1.7 x 3.2 cm fluid collection along the anterior margin of the lower thoracic esophagus (series 2/image 11), new since 08/04/2017 CT. Similar irregular thick walled curvilinear fluid collection posterior to the esophagogastric junction extending into the right retrocrural region measuring 3.2 x 1.5 x 4.1 cm (series 2/image 17), new. Hepatobiliary: Normal liver size. No liver mass. Normal gallbladder with no radiopaque cholelithiasis. No biliary ductal dilatation. CBD diameter 5 mm. Pancreas: Chronic atrophy of distal body and tail of the pancreas with associated pancreatic duct dilation in these pancreatic segments, compatible with chronic pancreatitis. New thick-walled 4.1 x 2.9 x 3.5 cm pancreatic head cystic lesion (series 2/image 26). Posterior pancreatic head 1.0 x 0.8 cm cystic lesion (series 2/image 28), mildly increased from 0.7 x 0.6 cm. New clustered cystic pancreatic lesions in the pancreatic body, largest 2.4 x 1.7 cm anteriorly (series 2/image 21). No significant acute peripancreatic fat stranding. Spleen: Normal size. No mass. Adrenals/Urinary Tract: Normal adrenals. Simple 1.1 cm upper left renal cyst. Otherwise normal kidneys, with no hydronephrosis. Chronic mild diffuse bladder wall thickening with  moderate bladder distention. Stomach/Bowel: Mild wall thickening throughout the esophagogastric junction. Irregular thick walled fluid collection along the posterior  margin of the antropyloric stomach measuring approximately 3.7 x 2.0 x 5.1 cm (series 2/image 31), new, exerting mild extrinsic mass-effect on the distal stomach. Stomach is not significantly distended. Normal caliber small bowel with no small bowel wall thickening. Oral contrast transits to the distal small bowel. Normal appendix. Normal large bowel with no diverticulosis, large bowel wall thickening or pericolonic fat stranding. Vascular/Lymphatic: Atherosclerotic nonaneurysmal abdominal aorta. Patent hepatic, portal and renal veins. Diminutive splenic vein, which remains patent, unchanged. No pathologically enlarged lymph nodes in the abdomen or pelvis. New ill-defined curvilinear soft tissue in the central mesentery, which appears to communicate with the posterior distal gastric fluid collection. Reproductive: Grossly normal uterus. No discrete adnexal mass. Small volume hemoperitoneum in the pelvic cul-de-sac. Other: No pneumoperitoneum.  No abdominal ascites. Musculoskeletal: No aggressive appearing focal osseous lesions. IMPRESSION: 1. 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 Redemonstrated findings of chronic pancreatitis in the distal body and tail of the pancreas. 2. New irregular thick walled fluid collections encasing the esophagogastric junction anteriorly and posteriorly with associated wall thickening throughout the esophagogastric junction. Similar new irregular thick walled fluid collection along the posterior margin of the antropyloric stomach with some mass effect on the distal stomach, which is not significantly distended. These nonspecific findings also presumably represent pancreatic pseudocysts with reactive wall thickening at the esophagogastric junction and distal stomach. Infected collections cannot be excluded. 3. Close CT or MRI abdomen follow-up imaging advised. 4. No biliary ductal  dilatation.  No radiopaque cholelithiasis. 5. Nonspecific small volume hemoperitoneum in the pelvic cul-de-sac. No discrete adnexal mass. Suggest follow-up pelvic ultrasound on a short-term basis when clinically feasible. 6. Nonspecific chronic diffuse bladder wall thickening. Moderate bladder distention. No hydronephrosis. 7. Right middle lobe 2 mm solid pulmonary nodule, not previously imaged. No follow-up needed if patient is low-risk. Non-contrast chest CT can be considered in 12 months if patient is high-risk. This recommendation follows the consensus statement: Guidelines for Management of Incidental Pulmonary Nodules Detected on CT Images:From the Fleischner Society 2017; published online before print (10.1148/radiol.2017161659). 8.  Aortic Atherosclerosis (ICD10-I70.0). Electronically Signed   By: Jason A Poff M.D.   On: 10/14/2018 12:49   Dg Abdomen Acute W/chest  Result Date: 10/14/2018 CLINICAL DATA:  Vomiting EXAM: DG ABDOMEN ACUTE W/ 1V CHEST COMPARISON:  08/04/2017 CT abdomen/pelvis. 10/09/2014 chest radiograph. FINDINGS: Stable cardiomediastinal silhouette with normal heart size. No pneumothorax. No pleural effusion. Lungs appear clear, with no acute consolidative airspace disease and no pulmonary edema. No dilated small bowel loops or air-fluid levels. Moderate gas and mild stool throughout the colon. No evidence of pneumatosis or pneumoperitoneum. No radiopaque nephrolithiasis. IMPRESSION: 1. No active cardiopulmonary disease. 2. Nonobstructive bowel gas pattern. Moderate gas and mild stool in the colon. Electronically Signed   By: Jason A Poff M.D.   On: 10/14/2018 09:25   Dg Esophagus W Single Cm (sol Or Thin Ba)  Result Date: 10/15/2018 CLINICAL DATA:  Dysphagia, lower esophageal and epigastric pain EXAM: ESOPHOGRAM / BARIUM SWALLOW / BARIUM TABLET STUDY TECHNIQUE: Combined double contrast and single contrast examination performed using effervescent crystals, thick barium liquid, and thin  barium liquid. The patient was observed with fluoroscopy swallowing a 13 mm barium sulphate tablet. FLUOROSCOPY TIME:  Fluoroscopy Time:  1 minutes 30 seconds Radiation Exposure Index (if provided by the fluoroscopic   device): 17.9 mGy Number of Acquired Spot Images: multiple fluoroscopic screen captures COMPARISON:  None FINDINGS: Esophageal distention: Normal without mass or stricture Filling defects:  None 12.5 mm barium tablet: Easily passed from oral cavity to stomach without obstruction. Motility:  Normal Mucosa:  Smooth without irregularity or ulceration Hypopharynx/cervical esophagus: Normal motion without laryngeal penetration or aspiration Hiatal hernia:  Absent GE reflux:  Not identified during exam Other:  N/A IMPRESSION: Unremarkable esophagram. Electronically Signed   By: Ulyses Southward M.D.   On: 10/15/2018 15:06    Assessment: 48 year old female with history of chronic pancreatitis due to ETOH, chronic abdominal pain, prior history of pseudocysts and EUS in 2014 and most recently 2017 as noted in consultation note from 5/4. Presented with failure to thrive, worsening abdominal pain, dysphagia, N/V, striking weight loss of 52 lbs since Feb 2020. CT this admission with multiple pseudocysts, with some concern for collections encasing the EG junction and possible mass effect of fluid collection posteriorly to antropyloric stomach. Dr. Darrick Penna reviewed with radiologist on 5/4 (see consultation addendum) and radiologist feels fluid collection at esophagus is not clinically significant but does have definite esophageal wall abnormality, no perforation. Mass effect on stomach not resulting in gastric outlet obstruction and will need outpatient EUS in 4-6 weeks with Dr. Christella Hartigan.   Weight loss:appreciate Nutrition involvement. Consideration for PEG if unable to tolerate Breeze or advancement of diet.   Abdominal pain: lipase marginally elevated. Likely dealing with sequela of chronic pancreatitis, unable  to exclude acute exacerbation. ETOH cessation about a month ago. EUS as planned as outpatient  Dysphagia/odynophagia: noted to have wall thickening of EG junction, with chronic symptoms. Only tolerating liquids. UGI completed yesterday, ordered by hospitalist, which is unremarkable without obvious stricture. No dysmotility.  Plans for diagnostic EGD to assess esophageal wall abnormality with Propofol today by Dr. Jena Gauss. Per radiologist recommendations, pass through EGJ with caution during endoscopy. She has been NPO except sips with meds. Heparin 5,000 subcutaneous given around 0500 but discontinued now. Differentials include severe esophagitis, HSV or CMV esophagitis, malignancy. Due to Heparin received at 0500, will postpone EGD until 11 am today with Propofol. No dilatation planned due to inflamed EG junction. Possibility of candida esophagitis as tongue with possible thrush; patient now stating she has noticed this at home for awhile and unable to improve despite oral hygiene. She also notes inability to take creon or PPI consistently due to nausea, abdominal pain. Difficult historian.  Plan: Remain NPO Continue IV PPI BID Heparin has been discontinued EGD with Propofol today by Dr. Jena Gauss. Patient aware of risks and benefits with desire to proceed.  EUS as outpatient with Dr. Christella Hartigan in 4-6 weeks   Gelene Mink, PhD, ANP-BC [facility]    LOS: 2 days    10/16/2018, 8:11 AM  Attending note.  CT images reviewed.  I re-reviewed the CT with Dr. Myles Rosenthal this morning.  He feels that all cysts are related to pancreas (including abnormal, extrinsic fluid collection at GE junction).  White tongue noted may or may not be related to thrush. All things considered, I do agree with diagnostic EGD to rule out an occult coexisting mucosal process in her upper GI tract.  It may come down to her failure to thrive related to acute on chronic pancreatitis with multiple pseudocysts  formation. May ultimately need enteral tube feedings.  An NJ tube would be fraught with problems in this clinical scenario..  A PEG with PEJ extension would be best approach.Marland Kitchen  Before discussing that avenue of therapy further, will perform an EGD at 11 AM today.  Discussed with anesthesia.  Further recommendations to follow.

## 2018-10-16 NOTE — Anesthesia Procedure Notes (Signed)
Procedure Name: MAC Date/Time: 10/16/2018 11:10 AM Performed by: Vista Deck, CRNA Pre-anesthesia Checklist: Patient identified, Emergency Drugs available, Suction available, Timeout performed and Patient being monitored Patient Re-evaluated:Patient Re-evaluated prior to induction Oxygen Delivery Method: Nasal Cannula

## 2018-10-16 NOTE — TOC Initial Note (Signed)
Transition of Care [facility](TOC) - Initial/Assessment Note    Patient Details  Name: Monique BootsRobin J Karpowicz MRN: [id number] Date of Birth: 11/11/1970  Transition of Care [facility](TOC) CM/SW Contact:    Annice NeedySettle, Azarah Dacy D, LCSW Phone Number: 10/16/2018, 3:24 PM  Clinical Narrative:                  Patient lives wither her significant other and adult son and daughter.  Patient states that she mainly stays in bed and gets up only to go to the bathroom which is right beside her room.  Patient states that she has no DME in the home.  Patient states that she seldom bathes because she is too weak, has no energy.  Patient states that her sig. Other takes her to appointments.  She does not have a PCP.  LCSW discussed PCP options for uninsured.  LCSW discussed that she does not eat because the smell of food sickens her. She stated that her family cooks but she does not eat.  Patient denied ETOH abuse. She stated that she has not had much to drink in the past two weeks.  She stated over the past two weeks she only used ETOH to ease the pain or to go to sleep.  Upon further discussion patient continued to indicate that she does not have a SA issue and that she is not interested in SA/ETOH treatment.  LCSW to attempt to locate PCP. LCSW to attempt to arrange charity HH.   Expected Discharge Plan: Home w Home Health Services Barriers to Discharge: No Barriers Identified   Patient Goals and CMS Choice        Expected Discharge Plan and Services Expected Discharge Plan: Home w Home Health Services In-house Referral: Clinical Social Work                                            Prior Living Arrangements/Services     Patient language and need for interpreter reviewed:: No Do you feel safe going back to the place where you live?: Yes      Need for Family Participation in Patient Care: Yes (Comment) Care giver support system in place?: Yes (comment)   Criminal Activity/Legal Involvement Pertinent to  Current Situation/Hospitalization: No - Comment as needed  Activities of Daily Living Home Assistive Devices/Equipment: None ADL Screening (condition at time of admission) Patient's cognitive ability adequate to safely complete daily activities?: Yes Is the patient deaf or have difficulty hearing?: No Does the patient have difficulty seeing, even when wearing glasses/contacts?: No Does the patient have difficulty concentrating, remembering, or making decisions?: No Patient able to express need for assistance with ADLs?: Yes Does the patient have difficulty dressing or bathing?: No Independently performs ADLs?: Yes (appropriate for developmental age) Does the patient have difficulty walking or climbing stairs?: No Weakness of Legs: None Weakness of Arms/Hands: None  Permission Sought/Granted Permission sought to share information with : Other (comment)(Locate PCP)                Emotional Assessment Appearance:: Appears stated age   Affect (typically observed): Appropriate Orientation: : Oriented to Self, Oriented to Place, Oriented to  Time, Oriented to Situation Alcohol / Substance Use: Not Applicable Psych Involvement: No (comment)  Admission diagnosis:  Adult failure to thrive [R62.7] Dehydration [E86.0] Hypokalemia [E87.6] Dysphagia [R13.10] Chronic pancreatitis, unspecified pancreatitis type (HCC) [K86.1] Patient  Active Problem List   Diagnosis Date Noted  . Protein-calorie malnutrition, severe 10/16/2018  . Chronic pancreatitis (HCC)   . Dysphagia   . Acute on chronic pancreatitis (HCC) 10/14/2018  . Adult failure to thrive 10/14/2018  . GERD (gastroesophageal reflux disease) 10/14/2018  . Dehydration 10/14/2018  . 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 Facial cellulitis 09/10/2012  . Insect bite 09/10/2012  . Depression 09/10/2012  . Anxiety 09/10/2012  . Hypokalemia 09/10/2012  . Tobacco abuse 09/10/2012  .  Alcohol dependence (HCC) 06/28/2012  . Benzodiazepine abuse, continuous (HCC) 06/28/2012  . Suicide threat or attempt 06/28/2012    Class: Acute  . 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 Diarrhea 11/11/2010  . Nausea 11/11/2010  . Vaginal discharge 11/11/2010   PCP:  Patient, No Pcp Per Pharmacy:   [facility] [phone] - [place], [place] - [address] AT [facility] OF [address] [place] [address] Phone: [phone] Fax: [phone]     Social Determinants of Health (SDOH) Interventions    Readmission Risk Interventions Readmission Risk Prevention Plan 10/16/2018  Transportation Screening Complete  HRI or Home Care Consult Complete  Social Work Consult for Recovery Care Planning/Counseling Complete  Palliative Care Screening Complete  Medication Review Oceanographer) Complete  Some recent data might be hidden

## 2018-10-17 ENCOUNTER — Encounter (HOSPITAL_COMMUNITY): Payer: Self-pay | Admitting: Internal Medicine

## 2018-10-17 ENCOUNTER — Inpatient Hospital Stay (HOSPITAL_COMMUNITY): Payer: Medicaid Other

## 2018-10-17 LAB — CBC
HCT: 23.8 % — ABNORMAL LOW (ref 36.0–46.0)
Hemoglobin: 7.9 g/dL — ABNORMAL LOW (ref 12.0–15.0)
MCH: 32.2 pg (ref 26.0–34.0)
MCHC: 33.2 g/dL (ref 30.0–36.0)
MCV: 97.1 fL (ref 80.0–100.0)
Platelets: 206 10*3/uL (ref 150–400)
RBC: 2.45 MIL/uL — ABNORMAL LOW (ref 3.87–5.11)
RDW: 15.9 % — ABNORMAL HIGH (ref 11.5–15.5)
WBC: 3.5 10*3/uL — ABNORMAL LOW (ref 4.0–10.5)
nRBC: 0 % (ref 0.0–0.2)

## 2018-10-17 LAB — BASIC METABOLIC PANEL
Anion gap: 7 (ref 5–15)
BUN: 15 mg/dL (ref 6–20)
CO2: 26 mmol/L (ref 22–32)
Calcium: 6.3 mg/dL — CL (ref 8.9–10.3)
Chloride: 106 mmol/L (ref 98–111)
Creatinine, Ser: <0.3 mg/dL — ABNORMAL LOW (ref 0.44–1.00)
Glucose, Bld: 136 mg/dL — ABNORMAL HIGH (ref 70–99)
Potassium: 3 mmol/L — ABNORMAL LOW (ref 3.5–5.1)
Sodium: 139 mmol/L (ref 135–145)

## 2018-10-17 MED ORDER — BUTAMBEN-TETRACAINE-BENZOCAINE 2-2-14 % EX AERO
1.0000 | INHALATION_SPRAY | Freq: Once | CUTANEOUS | Status: DC
  Filled 2018-10-17: qty 20

## 2018-10-17 MED ORDER — POTASSIUM CHLORIDE 10 MEQ/100ML IV SOLN
10.0000 meq | INTRAVENOUS | Status: AC
  Administered 2018-10-17 (×2): 10 meq via INTRAVENOUS
  Filled 2018-10-17 (×2): qty 100

## 2018-10-17 MED ORDER — IOPAMIDOL (ISOVUE-300) INJECTION 61%
50.0000 mL | Freq: Once | INTRAVENOUS | Status: AC | PRN
  Administered 2018-10-17: 18:00:00 50 mL via INTRAVENOUS

## 2018-10-17 MED ORDER — CALCIUM CARBONATE ANTACID 1250 MG/5ML PO SUSP
500.0000 mg | Freq: Three times a day (TID) | ORAL | Status: DC
  Administered 2018-10-17: 16:00:00 500 mg via ORAL
  Filled 2018-10-17 (×8): qty 5

## 2018-10-17 NOTE — Progress Notes (Signed)
Patient out of bed to chair

## 2018-10-17 NOTE — Progress Notes (Addendum)
Subjective:  Didn't sleep well. Full liquids are more difficult to tolerate than clears. Increased fullness in the substernal area and increased upper abdominal pain and into the back ALTHOUGH she reports only minimally worse. No N/V. BM last night was loose but nonbloody.   Objective: Vital signs in last 24 hours: Temp:  [97.8 F (36.6 C)-98.5 F (36.9 C)] 98.5 F (36.9 C) (05/06 0536) Pulse Rate:  [76-113] 113 (05/06 0536) Resp:  [16-20] 16 (05/06 0536) BP: (92-107)/(62-73) 99/68 (05/06 0536) SpO2:  [93 %-100 %] 100 % (05/06 0536) Last BM Date: 10/09/18 General:   Alert,  Well-developed, well-nourished, pleasant and cooperative in NAD Head:  Normocephalic and atraumatic. Eyes:  Sclera clear, no icterus.  Abdomen:  Soft, mild epigastric tenderness and slight distention. Normal bowel sounds, without guarding, and without rebound.   Extremities:  Without clubbing, deformity or edema. Neurologic:  Alert and  oriented x4;  grossly normal neurologically. Skin:  Intact without significant lesions or rashes. Psych:  Alert and cooperative. Normal mood and affect.  Intake/Output from previous day: 05/05 0701 - 05/06 0700 In: 1643 [P.O.:600; I.V.:1043] Out: 2450 [Urine:2450] Intake/Output this shift: No intake/output data recorded.  Lab Results: CBC Recent Labs    10/14/18 0818 10/15/18 0506 10/17/18 0454  WBC 7.1 4.3 3.5*  HGB 13.4 9.1* 7.9*  HCT 37.4 26.5* 23.8*  MCV 91.2 94.3 97.1  PLT 579* 287 206   BMET Recent Labs    10/14/18 0818 10/15/18 0506 10/17/18 0454  NA 134* 137 139  K 2.2* 3.2* 3.0*  CL 98 109 106  CO2 19* 22 26  GLUCOSE 146* 177* 136*  BUN 53* 30* 15  CREATININE 0.73 0.43* <0.30*  CALCIUM 9.9 7.7* 6.3*   LFTs Recent Labs    10/14/18 0818 10/15/18 0506  BILITOT 1.5* 0.4  ALKPHOS 154* 101  AST 59* 41  ALT 33 27  PROT 8.5* 5.3*  ALBUMIN 4.1 2.4*   Recent Labs    10/14/18 0818  LIPASE 76*   PT/INR No results for input(s): LABPROT, INR in  the last 72 hours.    Imaging Studies: Ct Abdomen Pelvis W Contrast  Result Date: 10/14/2018 CLINICAL DATA:  Inpatient. Failure to thrive. Chronic abdominal pain. Difficulty swallowing with sensation of food sticking in throat. Weight loss. History of pancreatitis. EXAM: CT ABDOMEN AND PELVIS WITH CONTRAST TECHNIQUE: Multidetector CT imaging of the abdomen and pelvis was performed using the standard protocol following bolus administration of intravenous contrast. CONTRAST:  30mL OMNIPAQUE IOHEXOL 300 MG/ML SOLN, 75mL OMNIPAQUE IOHEXOL 300 MG/ML SOLN COMPARISON:  10/14/2018 abdominal radiographs. 08/04/2017 CT abdomen/pelvis. FINDINGS: Motion degraded scan, partially limiting assessment. Lower chest: Right middle lobe 2 mm solid pulmonary nodule (series 5/image 1), not previously imaged. Additional tiny 2 mm pulmonary nodules at both lung bases are unchanged from 2017 CT, considered benign. Irregular thick walled 2.5 x 1.7 x 3.2 cm fluid collection along the anterior margin of the lower thoracic esophagus (series 2/image 11), new since 08/04/2017 CT. Similar irregular thick walled curvilinear fluid collection posterior to the esophagogastric junction extending into the right retrocrural region measuring 3.2 x 1.5 x 4.1 cm (series 2/image 17), new. Hepatobiliary: Normal liver size. No liver mass. Normal gallbladder with no radiopaque cholelithiasis. No biliary ductal dilatation. CBD diameter 5 mm. Pancreas: Chronic atrophy of distal body and tail of the pancreas with associated pancreatic duct dilation in these pancreatic segments, compatible with chronic pancreatitis. New thick-walled 4.1 x 2.9 x 3.5 cm pancreatic head cystic lesion (series  2/image 26). Posterior pancreatic head 1.0 x 0.8 cm cystic lesion (series 2/image 28), mildly increased from 0.7 x 0.6 cm. New clustered cystic pancreatic lesions in the pancreatic body, largest 2.4 x 1.7 cm anteriorly (series 2/image 21). No significant acute peripancreatic  fat stranding. Spleen: Normal size. No mass. Adrenals/Urinary Tract: Normal adrenals. Simple 1.1 cm upper left renal cyst. Otherwise normal kidneys, with no hydronephrosis. Chronic mild diffuse bladder wall thickening with moderate bladder distention. Stomach/Bowel: Mild wall thickening throughout the esophagogastric junction. Irregular thick walled fluid collection along the posterior margin of the antropyloric stomach measuring approximately 3.7 x 2.0 x 5.1 cm (series 2/image 31), new, exerting mild extrinsic mass-effect on the distal stomach. Stomach is not significantly distended. Normal caliber small bowel with no small bowel wall thickening. Oral contrast transits to the distal small bowel. Normal appendix. Normal large bowel with no diverticulosis, large bowel wall thickening or pericolonic fat stranding. Vascular/Lymphatic: Atherosclerotic nonaneurysmal abdominal aorta. Patent hepatic, portal and renal veins. Diminutive splenic vein, which remains patent, unchanged. No pathologically enlarged lymph nodes in the abdomen or pelvis. New ill-defined curvilinear soft tissue in the central mesentery, which appears to communicate with the posterior distal gastric fluid collection. Reproductive: Grossly normal uterus. No discrete adnexal mass. Small volume hemoperitoneum in the pelvic cul-de-sac. Other: No pneumoperitoneum.  No abdominal ascites. Musculoskeletal: No aggressive appearing focal osseous lesions. IMPRESSION: 1. 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 Redemonstrated findings of chronic pancreatitis in the distal body and tail of the pancreas. 2. New irregular thick walled fluid collections encasing the esophagogastric junction anteriorly and posteriorly with associated wall thickening throughout the esophagogastric junction. Similar new irregular thick walled fluid collection along the posterior  margin of the antropyloric stomach with some mass effect on the distal stomach, which is not significantly distended. These nonspecific findings also presumably represent pancreatic pseudocysts with reactive wall thickening at the esophagogastric junction and distal stomach. Infected collections cannot be excluded. 3. Close CT or MRI abdomen follow-up imaging advised. 4. No biliary ductal dilatation.  No radiopaque cholelithiasis. 5. Nonspecific small volume hemoperitoneum in the pelvic cul-de-sac. No discrete adnexal mass. Suggest follow-up pelvic ultrasound on a short-term basis when clinically feasible. 6. Nonspecific chronic diffuse bladder wall thickening. Moderate bladder distention. No hydronephrosis. 7. Right middle lobe 2 mm solid pulmonary nodule, not previously imaged. No follow-up needed if patient is low-risk. Non-contrast chest CT can be considered in 12 months if patient is high-risk. This recommendation follows the consensus statement: Guidelines for Management of Incidental Pulmonary Nodules Detected on CT Images:From the Fleischner Society 2017; published online before print ([phone]). 8.  Aortic Atherosclerosis (ICD10-I70.0). Electronically Signed   By: Delbert PhenixJason A Poff M.D.   On: 10/14/2018 12:49   Dg Abdomen Acute W/chest  Result Date: 10/14/2018 CLINICAL DATA:  Vomiting EXAM: DG ABDOMEN ACUTE W/ 1V CHEST COMPARISON:  08/04/2017 CT abdomen/pelvis. 10/09/2014 chest radiograph. FINDINGS: Stable cardiomediastinal silhouette with normal heart size. No pneumothorax. No pleural effusion. Lungs appear clear, with no acute consolidative airspace disease and no pulmonary edema. No dilated small bowel loops or air-fluid levels. Moderate gas and mild stool throughout the colon. No evidence of pneumatosis or pneumoperitoneum. No radiopaque nephrolithiasis. IMPRESSION: 1. No active cardiopulmonary disease. 2. Nonobstructive bowel gas pattern. Moderate gas and mild stool in the colon.  Electronically Signed   By: Delbert PhenixJason A Poff M.D.   On: 10/14/2018 09:25   Dg Esophagus W Single Cm (sol Or  Thin Ba)  Result Date: 10/15/2018 CLINICAL DATA:  Dysphagia, lower esophageal and epigastric pain EXAM: ESOPHOGRAM / BARIUM SWALLOW / BARIUM TABLET STUDY TECHNIQUE: Combined double contrast and single contrast examination performed using effervescent crystals, thick barium liquid, and thin barium liquid. The patient was observed with fluoroscopy swallowing a 13 mm barium sulphate tablet. FLUOROSCOPY TIME:  Fluoroscopy Time:  1 minutes 30 seconds Radiation Exposure Index (if provided by the fluoroscopic device): 17.9 mGy Number of Acquired Spot Images: multiple fluoroscopic screen captures COMPARISON:  None FINDINGS: Esophageal distention: Normal without mass or stricture Filling defects:  None 12.5 mm barium tablet: Easily passed from oral cavity to stomach without obstruction. Motility:  Normal Mucosa:  Smooth without irregularity or ulceration Hypopharynx/cervical esophagus: Normal motion without laryngeal penetration or aspiration Hiatal hernia:  Absent GE reflux:  Not identified during exam Other:  N/A IMPRESSION: Unremarkable esophagram. Electronically Signed   By: Ulyses Southward M.D.   On: 10/15/2018 15:06  [2 weeks]   Assessment: 48 y/o female with h/o chronic pancreatitis due to ETOH, chronic abdominal pain, prior history of pseudocysts and EUS in 2014 presenting with failure to thrive, worsening abdominal pain, dysphagia, N/V, 52 lb weight loss since 07/2018. CT this admission with multiple pseudocysts,with some concern for collections encasing the EG junction and possible mass effect of fluid collection posteriorly to antropyloric stomach. Mass effect on stomach not resulting in GOO. Several pancreatic cystic lesions as well. Plans for EUS in 4-6 weeks with Dr. Christella Hartigan.   Weight loss: nutrition involved. 48 hour calorie count in process with decision to be made regarding possibility of NJ vs PEG  with PEJ extension.   Abdominal pain: lipase minimally elevated. Cannot exclude acute exacerbation but likely dealing with chronic pancreatitis.  EGD this admission with normal esophagus, minimally deformed antrum nonspecific.   Dysphagia/odynophagia: EGD unremarkable this admission. On imaging there had been concerns for wall thickening of EG junction.  Anemia: likely multifactorial in part due to FTT. No overt GI bleeding noted. Continue to monitor.   Plan: Full liquids.  48 hour calorie count EUS as outpatient with Dr. Christella Hartigan in 4-6 weeks.  Continue supportive measures.   Leanna Battles. Dixon Boos [facility] [phone] 5/6/20208:38 AM     LOS: 3 days

## 2018-10-17 NOTE — Progress Notes (Signed)
CRITICAL VALUE ALERT  Critical Value:  Calcium 6.3  Date & Time Notied:  10/17/2018 0609  Provider Notified: lama, g  Orders Received/Actions taken: awaiting response

## 2018-10-17 NOTE — Progress Notes (Signed)
Entered pt room and pt states "can I have something for sleep." explained to pt the time of day. Pt then says "ok can I have something for pain then?" pt explains that she is a 10/10 pain but would prefer to just sleep all the time. Will continue to monitor.

## 2018-10-17 NOTE — Progress Notes (Signed)
PROGRESS NOTE    Monique Nguyen  GNF:[id number] DOB: 01/29/1971 DOA: 10/14/2018 PCP: Patient, No Pcp Per   Brief Narrative:  Per HPI: 48 y.o.femalewith past medical history significant for tobacco abuse, depression and/anxiety, gastroesophageal reflux disease, history of alcoholic pancreatitis and psoriasis; who presented to the hospital secondary to increased abdominal pain, nausea, vomiting and inability to eat. Patient reports that for the last month she has no being able to eat essentially anything (including having trouble even with liquids). She has no significant amount of weight and expressed increased abdominal pain. Pain is localized in her epigastric area/mid section, radiated to the left side of back, 10 out of 10 in intensity, no alleviating factors, associated with anorexia, nausea and intermittent episodes of vomiting (there has not been any blood seen in the vomit content). Patient denies any fever, chills, chest pain, shortness of breath, hematuria, dysuria, melena, hematochezia, focal weakness or any other complaints.  There has not been any sick contacts or recent travels.  Patient has been admitted with acute on chronic pancreatitis in the setting of some failure to thrive.  She has undergone EGD on 5/5 which was unremarkable.  She is currently undergoing a 48-hour calorie count with plans for endoscopic ultrasound for evaluation of pancreatic cystic lesions in the outpatient setting soon.   Assessment & Plan:   Principal Problem:   Acute on chronic pancreatitis (HCC) Active Problems:   Depression   Hypokalemia   Adult failure to thrive   GERD (gastroesophageal reflux disease)   Dehydration   Chronic pancreatitis (HCC)   Dysphagia   Protein-calorie malnutrition, severe   1-acute on chronic pancreatitis (HCC) -Continue as needed pain medication, antiemetics and supportive care -Continue IV fluids -CT scan done yesterday demonstrated development of  pseudocyst creating pressure in her esophagus and most likely being responsible for chronic abdominal pain and difficulty swallowing. -Endoscopy 5/5 which was unremarkable -Advanced to full liquids with 48-hour calorie count ongoing with possible need for NJ versus PEG versus PEJ  2-Depression/anxiety -Will need outpatient follow-up with psychiatry service -No suicidal ideation or hallucination -Patient reports no using any antidepressant prior to admission. -Continue anxiolytics as needed  3-Hypokalemia -Persistent -Continue following electrolytes trend and further replete as needed -Recheck magnesium in a.m. as well  4-Adult failure to thrive/severe protein calorie malnutrition -Will follow recommendations by dietitian regarding feeding supplements. -Most likely associated with chronic pancreatitis -Continue on Creon -Body mass index is 15.29 kg/m. -Depending on findings and further recommendation by GI patient might require the use of a PEG tube to ensure nutrition.  5-GERD (gastroesophageal reflux disease) -Continue PPI  6-Dehydration -Continue IV fluids and supportive care.   7-Dysphagia -Advance to full liquids with EGD performed 5/5 with no gastric outlet obstruction noted -Monitor calorie count  8-tobacco abuse -Cessation counseling has been provided -Continue nicotine patch.  9-thrush -Will treat with nystatin  10-hypocalcemia -Calcium carbonate ordered 3 times daily today -Monitor repeat labs  DVT prophylaxis: Heparin Code Status: Full code Family Communication: No family at bedside. Disposition Plan: Remains in the hospital, continue IV fluids and electrolytes repletion, follow GI service recommendations (with anticipated EGD later today 10/16/2018).  Continue IV PPI.  Consultants:   Gastroenterology  Procedures:   See below for x-ray reports.  Antimicrobials:     Anti-infectives (From admission, onward)   None       Subjective: Patient seen and evaluated today with some ongoing upper abdominal pain and fullness with radiation to her back.  This appears to  be about the same as yesterday.  She did have a bowel movement last night which was loose and nonbloody.  No nausea or vomiting noted.  Objective: Vitals:   10/16/18 1339 10/16/18 2025 10/16/18 2200 10/17/18 0536  BP: 99/69  92/62 99/68  Pulse: (!) 107  (!) 110 (!) 113  Resp: Temp: 97.9 F (36.6 C)  98.4 F (36.9 C) 98.5 F (36.9 C)  TempSrc: Oral  Oral Oral  SpO2: 100% 99% 100% 100%  Weight:      Height:        Intake/Output Summary (Last 24 hours) at 10/17/2018 0913 Last data filed at 10/17/2018 0500 Gross per 24 hour  Intake 1642.98 ml  Output 2450 ml  Net -807.02 ml   Filed Weights   10/14/18 0759  Weight: 36.7 kg    Examination:  General exam: Appears calm and comfortable, thin Respiratory system: Clear to auscultation. Respiratory effort normal. Cardiovascular system: S1 & S2 heard, RRR. No JVD, murmurs, rubs, gallops or clicks. No pedal edema. Gastrointestinal system: Abdomen is nondistended, soft and nontender. No organomegaly or masses felt. Normal bowel sounds heard. Central nervous system: Alert and oriented. No focal neurological deficits. Extremities: Symmetric 5 x 5 power. Skin: No rashes, lesions or ulcers Psychiatry: Judgement and insight appear normal.  Mildly anxious    Data Reviewed: I have personally reviewed following labs and imaging studies  CBC: Recent Labs  Lab 10/14/18 0818 10/15/18 0506 10/17/18 0454  WBC 7.1 4.3 3.5*  NEUTROABS 5.8  --   --   HGB 13.4 9.1* 7.9*  HCT 37.4 26.5* 23.8*  MCV 91.2 94.3 97.1  PLT 579* 287 206   Basic Metabolic Panel: Recent Labs  Lab 10/14/18 0818 10/14/18 1033 10/15/18 0506 10/17/18 0454  NA 134*  --  137 139  K 2.2*  --  3.2* 3.0*  CL 98  --  109 106  CO2 19*  --  22 26  GLUCOSE 146*  --  177* 136*  BUN 53*  --  30* 15  CREATININE 0.73   --  0.43* <0.30*  CALCIUM 9.9  --  7.7* 6.3*  MG 2.5*  --   --   --   PHOS  --  1.8*  --   --    GFR: CrCl cannot be calculated (This lab value cannot be used to calculate CrCl because it is not a number: <0.30). Liver Function Tests: Recent Labs  Lab 10/14/18 0818 10/15/18 0506  AST 59* 41  ALT 33 27  ALKPHOS 154* 101  BILITOT 1.5* 0.4  PROT 8.5* 5.3*  ALBUMIN 4.1 2.4*   Recent Labs  Lab 10/14/18 0818  LIPASE 76*   No results for input(s): AMMONIA in the last 168 hours. Coagulation Profile: No results for input(s): INR, PROTIME in the last 168 hours. Cardiac Enzymes: No results for input(s): CKTOTAL, CKMB, CKMBINDEX, TROPONINI in the last 168 hours. BNP (last 3 results) No results for input(s): PROBNP in the last 8760 hours. HbA1C: No results for input(s): HGBA1C in the last 72 hours. CBG: No results for input(s): GLUCAP in the last 168 hours. Lipid Profile: No results for input(s): CHOL, HDL, LDLCALC, TRIG, CHOLHDL, LDLDIRECT in the last 72 hours. Thyroid Function Tests: No results for input(s): TSH, T4TOTAL, FREET4, T3FREE, THYROIDAB in the last 72 hours. Anemia Panel: Recent Labs    10/14/18 1032  VITAMINB12 654   Sepsis Labs: No results for input(s): PROCALCITON, LATICACIDVEN in the last 168  hours.  Recent Results (from the past 240 hour(s))  SARS Coronavirus 2 (CEPHEID - Performed in [facility] hospital lab), Hosp Order     Status: None   Collection Time: 10/14/18  8:11 AM  Result Value Ref Range Status   SARS Coronavirus 2 NEGATIVE NEGATIVE Final    Comment: (NOTE) If result is NEGATIVE SARS-CoV-2 target nucleic acids are NOT DETECTED. The SARS-CoV-2 RNA is generally detectable in upper and lower  respiratory specimens during the acute phase of infection. The lowest  concentration of SARS-CoV-2 viral copies this assay can detect is 250  copies / mL. A negative result does not preclude SARS-CoV-2 infection  and should not be used as the sole basis  for treatment or other  patient management decisions.  A negative result may occur with  improper specimen collection / handling, submission of specimen other  than nasopharyngeal swab, presence of viral mutation(s) within the  areas targeted by this assay, and inadequate number of viral copies  (<250 copies / mL). A negative result must be combined with clinical  observations, patient history, and epidemiological information. If result is POSITIVE SARS-CoV-2 target nucleic acids are DETECTED. The SARS-CoV-2 RNA is generally detectable in upper and lower  respiratory specimens dur ing the acute phase of infection.  Positive  results are indicative of active infection with SARS-CoV-2.  Clinical  correlation with patient history and other diagnostic information is  necessary to determine patient infection status.  Positive results do  not rule out bacterial infection or co-infection with other viruses. If result is PRESUMPTIVE POSTIVE SARS-CoV-2 nucleic acids MAY BE PRESENT.   A presumptive positive result was obtained on the submitted specimen  and confirmed on repeat testing.  While 2019 novel coronavirus  (SARS-CoV-2) nucleic acids may be present in the submitted sample  additional confirmatory testing may be necessary for epidemiological  and / or clinical management purposes  to differentiate between  SARS-CoV-2 and other Sarbecovirus currently known to infect humans.  If clinically indicated additional testing with an alternate test  methodology [id number]) is advised. The SARS-CoV-2 RNA is generally  detectable in upper and lower respiratory sp ecimens during the acute  phase of infection. The expected result is Negative. Fact Sheet for Patients:  BoilerBrush.com.cy Fact Sheet for Healthcare Providers: https://pope.com/ This test is not yet approved or cleared by the [place] FDA and has been authorized for detection and/or  diagnosis of SARS-CoV-2 by FDA under an Emergency Use Authorization (EUA).  This EUA will remain in effect (meaning this test can be used) for the duration of the COVID-19 declaration under Section 564(b)(1) of the Act, 21 U.S.C. section 360bbb-3(b)(1), unless the authorization is terminated or revoked sooner. Performed at [facility], [address], [place], [place] [address]   Culture, Urine     Status: None   Collection Time: 10/14/18  5:23 PM  Result Value Ref Range Status   Specimen Description   Final    URINE, CLEAN CATCH Performed at [facility], [address] [place], [place] [address]    Special Requests   Final    NONE Performed at [facility] [facility], [address] [place], [place] [address]    Culture   Final    NO GROWTH Performed at [facility], [address] [place], [place] [address]    Report Status 10/16/2018 FINAL  Final         Radiology Studies: Dg Esophagus W Single Cm (sol Or Thin Ba)  Result  Date: 10/15/2018 CLINICAL DATA:  Dysphagia, lower esophageal and epigastric pain EXAM: ESOPHOGRAM / BARIUM SWALLOW / BARIUM TABLET STUDY TECHNIQUE: Combined double contrast and single contrast examination performed using effervescent crystals, thick barium liquid, and thin barium liquid. The patient was observed with fluoroscopy swallowing a 13 mm barium sulphate tablet. FLUOROSCOPY TIME:  Fluoroscopy Time:  1 minutes 30 seconds Radiation Exposure Index (if provided by the fluoroscopic device): 17.9 mGy Number of Acquired Spot Images: multiple fluoroscopic screen captures COMPARISON:  None FINDINGS: Esophageal distention: Normal without mass or stricture Filling defects:  None 12.5 mm barium tablet: Easily passed from oral cavity to stomach without obstruction. Motility:  Normal Mucosa:  Smooth without irregularity or ulceration Hypopharynx/cervical esophagus: Normal motion without laryngeal penetration or aspiration Hiatal hernia:  Absent GE reflux:  Not identified  during exam Other:  N/A IMPRESSION: Unremarkable esophagram. Electronically Signed   By: Ulyses Southward M.D.   On: 10/15/2018 15:06        Scheduled Meds:  calcium carbonate (dosed in mg elemental calcium)  500 mg of elemental calcium Oral TID WC   feeding supplement  1 Container Oral TID BM   feeding supplement (PRO-STAT SUGAR FREE 64)  30 mL Oral TID   folic acid  1 mg Intravenous Daily   heparin injection (subcutaneous)  5,000 Units Subcutaneous Q8H   lipase/protease/amylase  24,000 Units Oral With snacks   lipase/protease/amylase  36,000 Units Oral TID WC   multivitamin  15 mL Oral Daily   nicotine  21 mg Transdermal Daily   nystatin  5 mL Oral QID   pantoprazole  40 mg Oral Daily   thiamine injection  100 mg Intravenous Daily   Continuous Infusions:  dextrose 5 % and 0.9 % NaCl with KCl 40 mEq/L 100 mL/hr at 10/17/18 0500     LOS: 3 days    Time spent: 30 minutes    Finley Dinkel D Sherryll Burger, DO [facility] Pager [phone]  If 7PM-7AM, please contact night-coverage [email or web address] Password [email or web address] 10/17/2018, 9:13 AM

## 2018-10-17 NOTE — TOC Progression Note (Addendum)
Transition of Care [facility]) - Progression Note    Patient Details  Name: Monique Nguyen MRN: [id number] Date of Birth: Jun 20, 1970  Transition of Care [facility]) CM/SW Contact  Annice Needy, LCSW Phone Number: 10/17/2018, 3:04 PM  Clinical Narrative:    Patient has been scheduled with Dr. Linton Rump at the [facility] on 10/25/2018 at 2:30 for a telephone visit to be established as her PCP.   Discharge clinicals will need to be faxed to [facility] at [phone].   Expected Discharge Plan: Home w Home Health Services Barriers to Discharge: No Barriers Identified  Expected Discharge Plan and Services Expected Discharge Plan: Home w Home Health Services In-house Referral: Clinical Social Work                                             Social Determinants of Health (SDOH) Interventions    Readmission Risk Interventions Readmission Risk Prevention Plan 10/16/2018  Transportation Screening Complete  HRI or Home Care Consult Complete  Social Work Consult for Recovery Care Planning/Counseling Complete  Palliative Care Screening Complete  Medication Review Oceanographer) Complete  Some recent data might be hidden

## 2018-10-18 ENCOUNTER — Telehealth: Payer: Self-pay | Admitting: Gastroenterology

## 2018-10-18 DIAGNOSIS — K861 Other chronic pancreatitis: Secondary | ICD-10-CM

## 2018-10-18 DIAGNOSIS — K862 Cyst of pancreas: Secondary | ICD-10-CM

## 2018-10-18 LAB — COMPREHENSIVE METABOLIC PANEL
ALT: 17 U/L (ref 0–44)
AST: 21 U/L (ref 15–41)
Albumin: 1.9 g/dL — ABNORMAL LOW (ref 3.5–5.0)
Alkaline Phosphatase: 64 U/L (ref 38–126)
Anion gap: 6 (ref 5–15)
BUN: 16 mg/dL (ref 6–20)
CO2: 30 mmol/L (ref 22–32)
Calcium: 6.1 mg/dL — CL (ref 8.9–10.3)
Chloride: 104 mmol/L (ref 98–111)
Creatinine, Ser: <0.3 mg/dL — ABNORMAL LOW (ref 0.44–1.00)
Glucose, Bld: 136 mg/dL — ABNORMAL HIGH (ref 70–99)
Potassium: 3 mmol/L — ABNORMAL LOW (ref 3.5–5.1)
Sodium: 140 mmol/L (ref 135–145)
Total Bilirubin: 0.2 mg/dL — ABNORMAL LOW (ref 0.3–1.2)
Total Protein: 4.3 g/dL — ABNORMAL LOW (ref 6.5–8.1)

## 2018-10-18 LAB — CBC
HCT: 23.7 % — ABNORMAL LOW (ref 36.0–46.0)
Hemoglobin: 7.8 g/dL — ABNORMAL LOW (ref 12.0–15.0)
MCH: 32.2 pg (ref 26.0–34.0)
MCHC: 32.9 g/dL (ref 30.0–36.0)
MCV: 97.9 fL (ref 80.0–100.0)
Platelets: 167 10*3/uL (ref 150–400)
RBC: 2.42 MIL/uL — ABNORMAL LOW (ref 3.87–5.11)
RDW: 16.1 % — ABNORMAL HIGH (ref 11.5–15.5)
WBC: 3.6 10*3/uL — ABNORMAL LOW (ref 4.0–10.5)
nRBC: 0 % (ref 0.0–0.2)

## 2018-10-18 LAB — MAGNESIUM: Magnesium: 0.9 mg/dL — CL (ref 1.7–2.4)

## 2018-10-18 MED ORDER — MAGNESIUM SULFATE 2 GM/50ML IV SOLN
2.0000 g | Freq: Once | INTRAVENOUS | Status: AC
  Administered 2018-10-18: 10:00:00 2 g via INTRAVENOUS
  Filled 2018-10-18: qty 50

## 2018-10-18 MED ORDER — VITAL AF 1.2 CAL PO LIQD
1000.0000 mL | ORAL | Status: DC
  Filled 2018-10-18 (×2): qty 1000

## 2018-10-18 MED ORDER — PRO-STAT SUGAR FREE PO LIQD: 30.0000 mL | Freq: Two times a day (BID) | ORAL | Status: DC

## 2018-10-18 MED ORDER — POTASSIUM CHLORIDE CRYS ER 20 MEQ PO TBCR
20.0000 meq | EXTENDED_RELEASE_TABLET | Freq: Two times a day (BID) | ORAL | Status: AC
  Administered 2018-10-18 (×2): 20 meq via ORAL
  Filled 2018-10-18 (×2): qty 1

## 2018-10-18 MED ORDER — CALCIUM CARBONATE ANTACID 1250 MG/5ML PO SUSP
1000.0000 mg | Freq: Three times a day (TID) | ORAL | Status: DC
  Administered 2018-10-18 – 2018-10-20 (×9): 1000 mg via ORAL
  Filled 2018-10-18 (×14): qty 10

## 2018-10-18 MED ORDER — PRO-STAT SUGAR FREE PO LIQD
30.0000 mL | Freq: Two times a day (BID) | ORAL | Status: DC
  Administered 2018-10-18 – 2018-10-23 (×9): 30 mL via ORAL
  Filled 2018-10-18 (×10): qty 30

## 2018-10-18 MED ORDER — MAGNESIUM SULFATE 2 GM/50ML IV SOLN
2.0000 g | Freq: Once | INTRAVENOUS | Status: AC
  Administered 2018-10-18: 07:00:00 2 g via INTRAVENOUS
  Filled 2018-10-18: qty 50

## 2018-10-18 MED ORDER — OSMOLITE 1.5 CAL PO LIQD
1000.0000 mL | ORAL | Status: DC
  Administered 2018-10-18: 1000 mL
  Filled 2018-10-18 (×2): qty 1000

## 2018-10-18 NOTE — Progress Notes (Addendum)
Nutrition Follow Up  RD working remotely.    DOCUMENTATION CODES:   Severe malnutrition in context of chronic illness, Underweight    INTERVENTION:  Start continuous Vital 1.2 @ 25 ml/hr via NJ tube. Continue ProStat 30 ml daily orally. Total nutrition: 820 kcal, 60 gr protein and 487 ml free water.   Monitor magnesium, potassium, and phosphorus daily for at least 3 days, MD to replete as needed, as pt is at risk for refeeding syndrome given her severely malnourished state  NUTRITION DIAGNOSIS:   Severe Malnutrition related to chronic illness(chronic abdominal pain, pancreatitis and appetite loss ) as evidenced by per patient/family report, percent weight loss, severe fat depletion, severe muscle depletion.   GOAL:  Pt to meet >/= 90% of their estimated nutrition needs     MONITOR:  Diet advancement, ability of patient to meet est needs and weight trends     REASON FOR ASSESSMENT:  Consult-Malnutrition-Adult Failure to Thrive  Tube feeding management   ASSESSMENT: Patient is an underweight chronically ill appearing 48 yo female who presents with c/o abdominal pain, wt loss, N/V, swallow problem. History of acholic pancreatitis, alcohol abuse, GERD, IBS and panic attacks.   CT abdomen and pelvis-findings: multiple cystic lesions throughout her pancreas. Esophagram - no acute findings  Meal intake is poor and has been since before February. February 15th  pt presents to ED c/o appetite change, wt loss which had been a problem at least 2 months prior. She was d/c AMA at that time. Today During RD visit patient lunch is here clear liquids - a couple of bites of jello taken and sips of Boost Breeze.  5/7- Tube feeding consult received. RD working remotely today due to COVID restrictions.  NJ tube placed yesterday. Discussed with PA and RN this morning. Patient is still only taking sips of meals but took protein modular well with juice this morning. Will continue protein orally for  now.   Start with semi-elemental formula and at a low rate due to refeeding risk . Will follow-up in the morning to assess progression and adjust rate. Ideally- goal is to transition to a standard formula via PEG/J tube if placement becomes part of her clinical course.  Weight loss is severe. According to hospital records-she weighed 61.2 kg December 2019 and  59.9 kg recorded in February and down to to current 36.7 kg. Strongly recommend obtain re-weight and place on daily weights to verify severity of change. Severely malnourished patient.   Medications reviewed and include: folic acid, MVI, B-1, Protonix    Labs:  BMP Latest Ref Rng & Units 10/18/2018 10/17/2018 10/15/2018  Glucose 70 - 99 mg/dL 161(W136(H) 960(A136(H) 540(J177(H)  BUN 6 - 20 mg/dL 16 15 81(X30(H)  Creatinine 0.44 - 1.00 mg/dL <9.14(N<0.30(L) <8.29(F<0.30(L) 6.21(H0.43(L)  Sodium 135 - 145 mmol/L 140 139 137  Potassium 3.5 - 5.1 mmol/L 3.0(L) 3.0(L) 3.2(L)  Chloride 98 - 111 mmol/L 104 106 109  CO2 22 - 32 mmol/L 30 26 22   Calcium 8.9 - 10.3 mg/dL 6.1(LL) 6.3(LL) 7.7(L)     NUTRITION - FOCUSED PHYSICAL EXAM:    Most Recent Value  Orbital Region  Severe depletion  Upper Arm Region  Severe depletion  Thoracic and Lumbar Region  Severe depletion  Buccal Region  Moderate depletion  Temple Region  Moderate depletion  Clavicle Bone Region  Severe depletion  Clavicle and Acromion Bone Region  Severe depletion  Scapular Bone Region  Severe depletion  Dorsal Hand  Mild depletion  Patellar  Region  Severe depletion  Anterior Thigh Region  Severe depletion  Posterior Calf Region  Severe depletion  Edema (RD Assessment)  None  Hair  Unable to assess  Eyes  Reviewed  Mouth  Reviewed  Skin  Reviewed [tatoos accross fingers of both hands]      Diet Order:   Diet Order            Diet full liquid Room service appropriate? Yes; Fluid consistency: Thin  Diet effective now              EDUCATION NEEDS:   Education needs have been addressed(High Calorie  High Protein ) Skin:  Skin Assessment: Reviewed RN Assessment  Last BM:  5/6 small  Height:   Ht Readings from Last 1 Encounters:  10/14/18 5\' 1"  (1.549 m)    Weight:   Wt Readings from Last 1 Encounters:  10/14/18 36.7 kg    Ideal Body Weight:  48 kg  BMI:  Body mass index is 15.29 kg/m.  Estimated Nutritional Needs:   Kcal:  1444-1520 (38-40 kcal/kg/bw)  Protein:  55-63 gr (1.5-1.7 gr/kg/bw)  Fluid:  >1100 ml daily   Royann Shivers MS,RD,CSG,LDN Office: [phone] Pager: [phone]

## 2018-10-18 NOTE — Progress Notes (Signed)
Appreciate input from clinical nutrition. Discussed with Dr. Sherryll Burger.  Hopefully, enteral feeding tube can be maintained and she can be discharged with home health services in the next 24 to 48 hours.  May or may not need a percutaneous feeding tube, depending on her tolerance to the nasal tube and its durability. My office will be working on getting her an appointment back to see Dr. Corliss Parish at [facility] who can provide advanced endoscopic services (EUS, ERCP, etc.) as needed.  We will reassess tomorrow morning.

## 2018-10-18 NOTE — Progress Notes (Signed)
C/O intermittent pain and numbness in hands which started last night.  Good color and cap refill.  Sent Dr. Sherryll Burger this information.

## 2018-10-18 NOTE — TOC Progression Note (Addendum)
Transition of Care [facility]) - Progression Note    Patient Details  Name: Monique Nguyen MRN: [id number] Date of Birth: 1971-01-22  Transition [facility] Care [facility]) CM/SW Contact  Annice Needy, LCSW Phone Number: 10/18/2018, 5:10 PM  Clinical Narrative:    Referral was made with [facility] for charity [facility] services.  Eugenio Hoes with [facility] [phone] received patient demographics.  Message left for [facility] with [employer] regarding possibility of charity infusion for NJ tube feeding needs.  Patient has follow up appointment with the [facility] to be followed up as PCP.   10/19/2018: Referral for charity infusion provided to [facility] at [employer]. Olegario Messier will speak with patient and [employer] will determine if patient qualifies for charity.    Expected Discharge Plan: Home w Home Health Services Barriers to Discharge: Inadequate or no insurance  Expected Discharge Plan and Services Expected Discharge Plan: Home w Home Health Services In-house Referral: Clinical Social Work                                             Social Determinants of Health (SDOH) Interventions    Readmission Risk Interventions Readmission Risk Prevention Plan 10/17/2018 10/16/2018  Transportation Screening - Complete  PCP or Specialist Appt within 3-5 Days Complete -  HRI or Home Care Consult - Complete  Social Work Consult for Recovery Care Planning/Counseling - Complete  Palliative Care Screening - Complete  Medication Review Oceanographer) - Complete  Some recent data might be hidden

## 2018-10-18 NOTE — Progress Notes (Signed)
Xcover  Hypomagnesemia (magnesium 0.9) Mag sulfate 2gm iv x1   Hypocalcemia Adjusted calcium 7.7 Continue oral calcium  Hypokalemia Kdur po bid x2 doses

## 2018-10-18 NOTE — Progress Notes (Signed)
CRITICAL VALUE ALERT  Critical Value:  Calcium 6.1, Mg 0.9  Date & Time Notied: 10/18/2018 06:33  Provider Notified: Dr. Selena Batten  Orders Received/Actions taken: No new orders at this time

## 2018-10-18 NOTE — Progress Notes (Signed)
Subjective:  Has eaten about three bites of grits and has pain in epigastric area/lower esophagus.   Objective: Vital signs in last 24 hours: Temp:  [97.8 F (36.6 C)-98 F (36.7 C)] 97.8 F (36.6 C) (05/07 0508) Pulse Rate:  [103-110] 110 (05/07 0508) Resp:  [16-18] 16 (05/07 0508) BP: (97-106)/(67-72) 97/67 (05/07 0508) SpO2:  [98 %-100 %] 99 % (05/07 0508) Last BM Date: 10/17/18 General:   Alert,  Thin,  pleasant and cooperative in NAD Head:  Normocephalic and atraumatic. Eyes:  Sclera clear, no icterus.  Abdomen:  Soft, nontender and nondistended.  Extremities:  Without clubbing, deformity or edema. Neurologic:  Alert and  oriented x4;  grossly normal neurologically. Skin:  Intact without significant lesions or rashes. Psych:  Alert and cooperative. Normal mood and affect.  Intake/Output from previous day: 05/06 0701 - 05/07 0700 In: 1073.6 [P.O.:240; I.V.:833.6] Out: 400 [Urine:400] Intake/Output this shift: No intake/output data recorded.  Lab Results: CBC Recent Labs    10/17/18 0454 10/18/18 0444  WBC 3.5* 3.6*  HGB 7.9* 7.8*  HCT 23.8* 23.7*  MCV 97.1 97.9  PLT 206 167   BMET Recent Labs    10/17/18 0454 10/18/18 0444  NA 139 140  K 3.0* 3.0*  CL 106 104  CO2 26 30  GLUCOSE 136* 136*  BUN 15 16  CREATININE <0.30* <0.30*  CALCIUM 6.3* 6.1*   LFTs Recent Labs    10/18/18 0444  BILITOT 0.2*  ALKPHOS 64  AST 21  ALT 17  PROT 4.3*  ALBUMIN 1.9*   No results for input(s): LIPASE in the last 72 hours. PT/INR No results for input(s): LABPROT, INR in the last 72 hours.    Imaging Studies: Ct Abdomen Pelvis W Contrast  Result Date: 10/14/2018 CLINICAL DATA:  Inpatient. Failure to thrive. Chronic abdominal pain. Difficulty swallowing with sensation of food sticking in throat. Weight loss. History of pancreatitis. EXAM: CT ABDOMEN AND PELVIS WITH CONTRAST TECHNIQUE: Multidetector CT imaging of the abdomen and pelvis was performed using the  standard protocol following bolus administration of intravenous contrast. CONTRAST:  50mL OMNIPAQUE IOHEXOL 300 MG/ML SOLN, 50mL OMNIPAQUE IOHEXOL 300 MG/ML SOLN COMPARISON:  10/14/2018 abdominal radiographs. 08/04/2017 CT abdomen/pelvis. FINDINGS: Motion degraded scan, partially limiting assessment. Lower chest: Right middle lobe 2 mm solid pulmonary nodule (series 5/image 1), not previously imaged. Additional tiny 2 mm pulmonary nodules at both lung bases are unchanged from 2017 CT, considered benign. Irregular thick walled 2.5 x 1.7 x 3.2 cm fluid collection along the anterior margin of the lower thoracic esophagus (series 2/image 11), new since 08/04/2017 CT. Similar irregular thick walled curvilinear fluid collection posterior to the esophagogastric junction extending into the right retrocrural region measuring 3.2 x 1.5 x 4.1 cm (series 2/image 17), new. Hepatobiliary: Normal liver size. No liver mass. Normal gallbladder with no radiopaque cholelithiasis. No biliary ductal dilatation. CBD diameter 5 mm. Pancreas: Chronic atrophy of distal body and tail of the pancreas with associated pancreatic duct dilation in these pancreatic segments, compatible with chronic pancreatitis. New thick-walled 4.1 x 2.9 x 3.5 cm pancreatic head cystic lesion (series 2/image 26). Posterior pancreatic head 1.0 x 0.8 cm cystic lesion (series 2/image 28), mildly increased from 0.7 x 0.6 cm. New clustered cystic pancreatic lesions in the pancreatic body, largest 2.4 x 1.7 cm anteriorly (series 2/image 21). No significant acute peripancreatic fat stranding. Spleen: Normal size. No mass. Adrenals/Urinary Tract: Normal adrenals. Simple 1.1 cm upper left renal cyst. Otherwise normal kidneys, with no hydronephrosis.  Chronic mild diffuse bladder wall thickening with moderate bladder distention. Stomach/Bowel: Mild wall thickening throughout the esophagogastric junction. Irregular thick walled fluid collection along the posterior margin of  the antropyloric stomach measuring approximately 3.7 x 2.0 x 5.1 cm (series 2/image 31), new, exerting mild extrinsic mass-effect on the distal stomach. Stomach is not significantly distended. Normal caliber small bowel with no small bowel wall thickening. Oral contrast transits to the distal small bowel. Normal appendix. Normal large bowel with no diverticulosis, large bowel wall thickening or pericolonic fat stranding. Vascular/Lymphatic: Atherosclerotic nonaneurysmal abdominal aorta. Patent hepatic, portal and renal veins. Diminutive splenic vein, which remains patent, unchanged. No pathologically enlarged lymph nodes in the abdomen or pelvis. New ill-defined curvilinear soft tissue in the central mesentery, which appears to communicate with the posterior distal gastric fluid collection. Reproductive: Grossly normal uterus. No discrete adnexal mass. Small volume hemoperitoneum in the pelvic cul-de-sac. Other: No pneumoperitoneum.  No abdominal ascites. Musculoskeletal: No aggressive appearing focal osseous lesions. IMPRESSION: 1. 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 Redemonstrated findings of chronic pancreatitis in the distal body and tail of the pancreas. 2. New irregular thick walled fluid collections encasing the esophagogastric junction anteriorly and posteriorly with associated wall thickening throughout the esophagogastric junction. Similar new irregular thick walled fluid collection along the posterior margin of the antropyloric stomach with some mass effect on the distal stomach, which is not significantly distended. These nonspecific findings also presumably represent pancreatic pseudocysts with reactive wall thickening at the esophagogastric junction and distal stomach. Infected collections cannot be excluded. 3. Close CT or MRI abdomen follow-up imaging advised. 4. No biliary ductal dilatation.   No radiopaque cholelithiasis. 5. Nonspecific small volume hemoperitoneum in the pelvic cul-de-sac. No discrete adnexal mass. Suggest follow-up pelvic ultrasound on a short-term basis when clinically feasible. 6. Nonspecific chronic diffuse bladder wall thickening. Moderate bladder distention. No hydronephrosis. 7. Right middle lobe 2 mm solid pulmonary nodule, not previously imaged. No follow-up needed if patient is low-risk. Non-contrast chest CT can be considered in 12 months if patient is high-risk. This recommendation follows the consensus statement: Guidelines for Management of Incidental Pulmonary Nodules Detected on CT Images:From the Fleischner Society 2017; published online before print (10.1148/[id number]). 8.  Aortic Atherosclerosis (ICD10-I70.0). Electronically Signed   By: Delbert PhenixJason A Poff M.D.   On: 10/14/2018 12:49   Dg Abdomen Acute W/chest  Result Date: 10/14/2018 CLINICAL DATA:  Vomiting EXAM: DG ABDOMEN ACUTE W/ 1V CHEST COMPARISON:  08/04/2017 CT abdomen/pelvis. 10/09/2014 chest radiograph. FINDINGS: Stable cardiomediastinal silhouette with normal heart size. No pneumothorax. No pleural effusion. Lungs appear clear, with no acute consolidative airspace disease and no pulmonary edema. No dilated small bowel loops or air-fluid levels. Moderate gas and mild stool throughout the colon. No evidence of pneumatosis or pneumoperitoneum. No radiopaque nephrolithiasis. IMPRESSION: 1. No active cardiopulmonary disease. 2. Nonobstructive bowel gas pattern. Moderate gas and mild stool in the colon. Electronically Signed   By: Delbert PhenixJason A Poff M.D.   On: 10/14/2018 09:25   Dg Monique BickerNaso G Tube Plc W/fl W/rad  Result Date: 10/17/2018 CLINICAL DATA:  Feeding tube placement, chronic pancreatitis, weight loss, failure to thrive EXAM: NASO G TUBE PLACEMENT WITH FL AND WITH RAD CONTRAST:  15 mL ISOVUE-300 IOPAMIDOL (ISOVUE-300) INJECTION 61% FLUOROSCOPY TIME:  Fluoroscopy Time:  3 minutes 12 seconds Radiation  Exposure Index (if provided by the fluoroscopic device): 33.9 mGy Number of Acquired Spot Images: Single cine fluoroscopic series COMPARISON:  CT abdomen and pelvis 10/14/2018 FINDINGS: Pharynx was anesthetized with Cetacaine spray. Utilizing lubricant, a feeding tube was passed through the RIGHT nasal passages in the hypopharynx. With patient swallowing and neck flexed, feeding tube was advanced into the thoracic esophagus and down into the stomach. Catheter was manipulated and placed across the pylorus into the duodenum. Catheter was advanced to the level of ligament of Treitz. Contrast injection confirms presence of the catheter tip at the ligament of Treitz. IMPRESSION: Placement of nasogastric feeding tube to the ligament of Treitz. Electronically Signed   By: Ulyses Southward M.D.   On: 10/17/2018 18:07   Dg Esophagus W Single Cm (sol Or Thin Ba)  Result Date: 10/15/2018 CLINICAL DATA:  Dysphagia, lower esophageal and epigastric pain EXAM: ESOPHOGRAM / BARIUM SWALLOW / BARIUM TABLET STUDY TECHNIQUE: Combined double contrast and single contrast examination performed using effervescent crystals, thick barium liquid, and thin barium liquid. The patient was observed with fluoroscopy swallowing a 13 mm barium sulphate tablet. FLUOROSCOPY TIME:  Fluoroscopy Time:  1 minutes 30 seconds Radiation Exposure Index (if provided by the fluoroscopic device): 17.9 mGy Number of Acquired Spot Images: multiple fluoroscopic screen captures COMPARISON:  None FINDINGS: Esophageal distention: Normal without mass or stricture Filling defects:  None 12.5 mm barium tablet: Easily passed from oral cavity to stomach without obstruction. Motility:  Normal Mucosa:  Smooth without irregularity or ulceration Hypopharynx/cervical esophagus: Normal motion without laryngeal penetration or aspiration Hiatal hernia:  Absent GE reflux:  Not identified during exam Other:  N/A IMPRESSION: Unremarkable esophagram. Electronically Signed   By: Ulyses Southward M.D.   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 CT this admission with multiple pseudocysts,with some concern for collections encasing the EG junction and possible mass effect of fluid collection posteriorly to antropyloric stomach. Mass effect on stomach not resulting in GOO. Several pancreatic cystic lesions as well. Plans for EUS in 4-6 weeks.  Weight loss: nutrition involved. NJ placed yesterday.   Abd pain: lipase minimally elevated, suspect symptoms secondary to chronic pancreatitis. EGD this admission unremarkable. 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   Dysphagia/odynophagia: EGD unremarkable this admission. CT with concerns for wall thickening at EG junction, but not seen endoscopically.  Anemia: multifactorial in part due to FTT. No overt GI bleeding. H/H stable.   Electrolyte abnormalities: decreased magnesium/potassium/calcium. Per attending.   Plan: 1. Will have nutrition give input regarding TF recommendations. 2. EUS as outpatient. Would recommend her go back to [facility] due to h/o pancreatic duct stones and possibility of needing ERCP with removal (typically done at tertiary care facilities only).  Leanna Battles. Dixon [facility]os [facility] [phone] 5/7/20208:09 AM     LOS: 4 days

## 2018-10-18 NOTE — Progress Notes (Signed)
NJ tube feeding started after checking placement with air.  Osmolite 1.5 at 20 ml/hr by kangaroo pump.  HOB at least 30 degrees.  Stated hands are not as numb and painful as this morning and seems more alert and talkative.

## 2018-10-18 NOTE — Progress Notes (Signed)
PROGRESS NOTE    Monique Nguyen  ZOX:[id number] DOB: [birth year] DOA: 10/14/2018 PCP: Patient, No Pcp Per   Brief Narrative:  Per HPI: 48 y.o.femalewith past medical history significant for tobacco abuse, depression and/anxiety, gastroesophageal reflux disease, history of alcoholic pancreatitis and psoriasis; who presented to the hospital secondary to increased abdominal pain, nausea, vomiting and inability to eat. Patient reports that for the last month she has no being able to eat essentially anything (including having trouble even with liquids). She has no significant amount of weight and expressed increased abdominal pain. Pain is localized in her epigastric area/mid section, radiated to the left side of back, 10 out of 10 in intensity, no alleviating factors, associated with anorexia, nausea and intermittent episodes of vomiting (there has not been any blood seen in the vomit content). Patient denies any fever, chills, chest pain, shortness of breath, hematuria, dysuria, melena, hematochezia, focal weakness or any other complaints.  There has not been any sick contacts or recent travels.  Patient has been admitted with acute on chronic pancreatitis in the setting of some failure to thrive.  She has undergone EGD on 5/5 which was unremarkable.    She has undergone placement of NJ tube on 5/6.   Assessment & Plan:   Principal Problem:   Acute on chronic pancreatitis (HCC) Active Problems:   Depression   Hypokalemia   Adult failure to thrive   GERD (gastroesophageal reflux disease)   Dehydration   Chronic pancreatitis (HCC)   Dysphagia   Protein-calorie malnutrition, severe   1-acute on chronic pancreatitis (HCC) -Continue as needed pain medication, antiemetics and supportive care -Continue IV fluids -CT scan done 5/5 demonstrated development of pseudocyst creating pressure in her esophagus and most likely being responsible for chronic abdominal pain and difficulty  swallowing. -Endoscopy 5/5 which was unremarkable -NG tube placed yesterday and nutrition to give tube feeding recommendations to initiate today.  We will see how well this is tolerated with further plans per GI for PEG/PEJ. -Patient will require tertiary level care for endoscopic ultrasound and ERCP with removal of stones in the near future.  2-Depression/anxiety -Will need outpatient follow-up with psychiatry service -No suicidal ideation or hallucination -Patient reports no using any antidepressant prior to admission. -Continue anxiolytics as needed  3-Hypokalemia/hypomagnesemia -Persistent -Continue following electrolytes trend and further replete as needed -Replete and recheck in a.m.   4-Adult failure to thrive/severe protein calorie malnutrition -Will follow recommendations by dietitian regarding feeding supplements. -Most likely associated with chronic pancreatitis -Continue on Creon -Body mass index is 15.29 kg/m. -Status post NJ tube yesterday.  Appreciate nutrition recommendations with initiation of tube feedings today  5-GERD (gastroesophageal reflux disease) -Continue PPI  6-Dehydration -Continue IV fluids and supportive care.  7-Dysphagia -Advance to full liquids with EGD performed 5/5 with no gastric outlet obstruction noted -Monitor calorie count  8-tobacco abuse -Cessation counseling has been provided -Continue nicotine patch.  9-thrush -Will treat with nystatin  10-hypocalcemia-persistent -Increase calcium carbonate 2000 mg 3 times daily today -Monitor repeat labs  DVT prophylaxis:Heparin Code Status:Full code Family Communication:No family at bedside. Disposition Plan:Remains in the hospital, continue IV fluids and electrolytes repletion, follow GI service recommendations with NG tube placed 5/6 in anticipation of tube feedings to initiate today.  Consultants:  Gastroenterology  Procedures:  See below for x-ray  reports.  Antimicrobials:    Anti-infectives(From admission, onward)   None      Subjective: Patient seen and evaluated today with no new acute complaints or concerns. No  acute concerns or events noted overnight.  She has some minimal epigastric abdominal pain and had very minimal intake noted yesterday.  Objective: Vitals:   10/17/18 1500 10/17/18 2030 10/17/18 2111 10/18/18 0508  BP: 105/72  106/72 97/67  Pulse: (!) 103  (!) 108 (!) 110  Resp: Temp: 97.9 F (36.6 C)  98 F (36.7 C) 97.8 F (36.6 C)  TempSrc: Oral  Oral Oral  SpO2: 100% 98% 100% 99%  Weight:      Height:        Intake/Output Summary (Last 24 hours) at 10/18/2018 0904 Last data filed at 10/17/2018 1900 Gross per 24 hour  Intake 1073.55 ml  Output 400 ml  Net 673.55 ml   Filed Weights   10/14/18 0759  Weight: 36.7 kg    Examination:  General exam: Appears calm and comfortable  Respiratory system: Clear to auscultation. Respiratory effort normal. Cardiovascular system: S1 & S2 heard, RRR. No JVD, murmurs, rubs, gallops or clicks. No pedal edema. Gastrointestinal system: Abdomen is nondistended, soft and nontender. No organomegaly or masses felt. Normal bowel sounds heard. Central nervous system: Alert and oriented. No focal neurological deficits. Extremities: Symmetric 5 x 5 power. Skin: No rashes, lesions or ulcers Psychiatry: Judgement and insight appear normal. Mood & affect appropriate.     Data Reviewed: I have personally reviewed following labs and imaging studies  CBC: Recent Labs  Lab 10/14/18 0818 10/15/18 0506 10/17/18 0454 10/18/18 0444  WBC 7.1 4.3 3.5* 3.6*  NEUTROABS 5.8  --   --   --   HGB 13.4 9.1* 7.9* 7.8*  HCT 37.4 26.5* 23.8* 23.7*  MCV 91.2 94.3 97.1 97.9  PLT 579* 287 206 167   Basic Metabolic Panel: Recent Labs  Lab 10/14/18 0818 10/14/18 1033 10/15/18 0506 10/17/18 0454 10/18/18 0444  NA 134*  --  137 139 140  K 2.2*  --  3.2* 3.0* 3.0*   CL 98  --  109 106 104  CO2 19*  --  GLUCOSE 146*  --  177* 136* 136*  BUN 53*  --  30* 15 16  CREATININE 0.73  --  0.43* <0.30* <0.30*  CALCIUM 9.9  --  7.7* 6.3* 6.1*  MG 2.5*  --   --   --  0.9*  PHOS  --  1.8*  --   --   --    GFR: CrCl cannot be calculated (This lab value cannot be used to calculate CrCl because it is not a number: <0.30). Liver Function Tests: Recent Labs  Lab 10/14/18 0818 10/15/18 0506 10/18/18 0444  AST 59* 41 21  ALT 33 27 17  ALKPHOS 154* 101 64  BILITOT 1.5* 0.4 0.2*  PROT 8.5* 5.3* 4.3*  ALBUMIN 4.1 2.4* 1.9*   Recent Labs  Lab 10/14/18 0818  LIPASE 76*   No results for input(s): AMMONIA in the last 168 hours. Coagulation Profile: No results for input(s): INR, PROTIME in the last 168 hours. Cardiac Enzymes: No results for input(s): CKTOTAL, CKMB, CKMBINDEX, TROPONINI in the last 168 hours. BNP (last 3 results) No results for input(s): PROBNP in the last 8760 hours. HbA1C: No results for input(s): HGBA1C in the last 72 hours. CBG: No results for input(s): GLUCAP in the last 168 hours. Lipid Profile: No results for input(s): CHOL, HDL, LDLCALC, TRIG, CHOLHDL, LDLDIRECT in the last 72 hours. Thyroid Function Tests: No results for input(s): TSH, T4TOTAL, FREET4, T3FREE, THYROIDAB in the  last 72 hours. Anemia Panel: No results for input(s): VITAMINB12, FOLATE, FERRITIN, TIBC, IRON, RETICCTPCT in the last 72 hours. Sepsis Labs: No results for input(s): PROCALCITON, LATICACIDVEN in the last 168 hours.  Recent Results (from the past 240 hour(s))  SARS Coronavirus 2 (CEPHEID - Performed in [facility] hospital lab), Hosp Order     Status: None   Collection Time: 10/14/18  8:11 AM  Result Value Ref Range Status   SARS Coronavirus 2 NEGATIVE NEGATIVE Final    Comment: (NOTE) If result is NEGATIVE SARS-CoV-2 target nucleic acids are NOT DETECTED. The SARS-CoV-2 RNA is generally detectable in upper and lower  respiratory specimens  during the acute phase of infection. The lowest  concentration of SARS-CoV-2 viral copies this assay can detect is 250  copies / mL. A negative result does not preclude SARS-CoV-2 infection  and should not be used as the sole basis for treatment or other  patient management decisions.  A negative result may occur with  improper specimen collection / handling, submission of specimen other  than nasopharyngeal swab, presence of viral mutation(s) within the  areas targeted by this assay, and inadequate number of viral copies  (<250 copies / mL). A negative result must be combined with clinical  observations, patient history, and epidemiological information. If result is POSITIVE SARS-CoV-2 target nucleic acids are DETECTED. The SARS-CoV-2 RNA is generally detectable in upper and lower  respiratory specimens dur ing the acute phase of infection.  Positive  results are indicative of active infection with SARS-CoV-2.  Clinical  correlation with patient history and other diagnostic information is  necessary to determine patient infection status.  Positive results do  not rule out bacterial infection or co-infection with other viruses. If result is PRESUMPTIVE POSTIVE SARS-CoV-2 nucleic acids MAY BE PRESENT.   A presumptive positive result was obtained on the submitted specimen  and confirmed on repeat testing.  While 2019 novel coronavirus  (SARS-CoV-2) nucleic acids may be present in the submitted sample  additional confirmatory testing may be necessary for epidemiological  and / or clinical management purposes  to differentiate between  SARS-CoV-2 and other Sarbecovirus currently known to infect humans.  If clinically indicated additional testing with an alternate test  methodology [phone]) is advised. The SARS-CoV-2 RNA is generally  detectable in upper and lower respiratory sp ecimens during the acute  phase of infection. The expected result is Negative. Fact Sheet for Patients:   BoilerBrush.com.cy Fact Sheet for Healthcare Providers: https://pope.com/ This test is not yet approved or cleared by the [place] FDA and has been authorized for detection and/or diagnosis of SARS-CoV-2 by FDA under an Emergency Use Authorization (EUA).  This EUA will remain in effect (meaning this test can be used) for the duration of the COVID-19 declaration under Section 564(b)(1) of the Act, 21 U.S.C. section 360bbb-3(b)(1), unless the authorization is terminated or revoked sooner. Performed at [facility], [address] [place], [place] [address]   Culture, Urine     Status: None   Collection Time: 10/14/18  5:23 PM  Result Value Ref Range Status   Specimen Description   Final    URINE, CLEAN CATCH Performed at [facility], [address], [place], [place] [address]    Special Requests   Final    NONE Performed at [facility], [address] [place], [place] [address]    Culture   Final    NO GROWTH Performed at [facility], [address] [place],  [place] [id number]    Report Status 10/16/2018 FINAL  Final         Radiology Studies: Dg Basil Dessaso G Tube Plc W/fl W/rad  Result Date: 10/17/2018 CLINICAL DATA:  Feeding tube placement, chronic pancreatitis, weight loss, failure to thrive EXAM: NASO G TUBE PLACEMENT WITH FL AND WITH RAD CONTRAST:  15 mL ISOVUE-300 IOPAMIDOL (ISOVUE-300) INJECTION 61% FLUOROSCOPY TIME:  Fluoroscopy Time:  3 minutes 12 seconds Radiation Exposure Index (if provided by the fluoroscopic device): 33.9 mGy Number of Acquired Spot Images: Single cine fluoroscopic series COMPARISON:  CT abdomen and pelvis 10/14/2018 FINDINGS: Pharynx was anesthetized with Cetacaine spray. Utilizing lubricant, a feeding tube was passed through the RIGHT nasal passages in the hypopharynx. With patient swallowing and neck flexed, feeding tube was advanced into the thoracic esophagus and down into the stomach. Catheter  was manipulated and placed across the pylorus into the duodenum. Catheter was advanced to the level of ligament of Treitz. Contrast injection confirms presence of the catheter tip at the ligament of Treitz. IMPRESSION: Placement of nasogastric feeding tube to the ligament of Treitz. Electronically Signed   By: Ulyses SouthwardMark  Boles M.D.   On: 10/17/2018 18:07        Scheduled Meds:  butamben-tetracaine-benzocaine  1 spray Topical Once   calcium carbonate (dosed in mg elemental calcium)  1,000 mg of elemental calcium Oral TID WC   feeding supplement  1 Container Oral TID BM   feeding supplement (PRO-STAT SUGAR FREE 64)  30 mL Oral TID   folic acid  1 mg Intravenous Daily   heparin injection (subcutaneous)  5,000 Units Subcutaneous Q8H   lipase/protease/amylase  24,000 Units Oral With snacks   lipase/protease/amylase  36,000 Units Oral TID WC   multivitamin  15 mL Oral Daily   nicotine  21 mg Transdermal Daily   nystatin  5 mL Oral QID   pantoprazole  40 mg Oral Daily   potassium chloride  20 mEq Oral BID   thiamine injection  100 mg Intravenous Daily   Continuous Infusions:  dextrose 5 % and 0.9 % NaCl with KCl 40 mEq/L 100 mL/hr at 10/18/18 0137   magnesium sulfate bolus IVPB       LOS: 4 days    Time spent: 30 minutes    Jamarion [place] Hoover BrunetteD Berdena Cisek, DO [facility] Pager [phone]  If 7PM-7AM, please contact night-coverage [email or web address] Password [email or web address] 10/18/2018, 9:04 AM

## 2018-10-18 NOTE — Telephone Encounter (Signed)
Please make urgent referral to Dr. Corliss Parish at [facility] for chronic pancreatitis, pancreatic cysts/pseudocysts and h/o pancreatic duct stones,  failure to thrive.

## 2018-10-18 NOTE — Progress Notes (Signed)
Nutrition Brief Note  Forgot that Vital 1.2 is not currently available at [facility] d/t reallocation of tube feeding formulas during covid pandemic.   Will change to Osmolite 1.5 @ 20cc/hr w/ Prostat 30 ml BID. This will provide: 920 kcals, 60g Pro and 366cc fluid.   If removed from IVF support, recommend addition of 150cc q4 hrs to meet 100% of fluid needs.   Given her risk for electrolyte shifts, beginning TF at lower rate. Please see RD note from earlier today for further details.   Christophe Louis RD, LDN, CNSC Clinical Nutrition Available Tues-Sat via Pager: [phone] 10/18/2018 1:58 PM

## 2018-10-19 LAB — BASIC METABOLIC PANEL
Anion gap: 6 (ref 5–15)
BUN: 10 mg/dL (ref 6–20)
CO2: 26 mmol/L (ref 22–32)
Calcium: 6.7 mg/dL — ABNORMAL LOW (ref 8.9–10.3)
Chloride: 108 mmol/L (ref 98–111)
Creatinine, Ser: <0.3 mg/dL — ABNORMAL LOW (ref 0.44–1.00)
Glucose, Bld: 119 mg/dL — ABNORMAL HIGH (ref 70–99)
Potassium: 4.5 mmol/L (ref 3.5–5.1)
Sodium: 140 mmol/L (ref 135–145)

## 2018-10-19 LAB — MAGNESIUM: Magnesium: 1.6 mg/dL — ABNORMAL LOW (ref 1.7–2.4)

## 2018-10-19 MED ORDER — MAGNESIUM SULFATE 2 GM/50ML IV SOLN
2.0000 g | Freq: Once | INTRAVENOUS | Status: AC
  Administered 2018-10-19: 09:00:00 2 g via INTRAVENOUS
  Filled 2018-10-19: qty 50

## 2018-10-19 MED ORDER — OSMOLITE 1.5 CAL PO LIQD
1000.0000 mL | ORAL | Status: DC
  Filled 2018-10-19 (×4): qty 1000

## 2018-10-19 NOTE — Progress Notes (Signed)
Has denied nausea today but has had morphine for pain and ativan for anxiety.  Requested to see chaplain .  Email sent to chaplain.

## 2018-10-19 NOTE — Progress Notes (Signed)
Patient looking forward to going home.  Tolerating tube very well.  Tube feeds at 30 cc/h.   Taking some p.o. as well. Still asking for morphine occasionally for abdominal pain.  No diarrhea.   Vital signs in last 24 hours: Temp:  [97.6 F (36.4 C)-98.6 F (37 C)] 97.6 F (36.4 C) (05/08 0515) Pulse Rate:  [107-111] 110 (05/08 0515) Resp:  [16] 16 (05/08 0515) BP: (92-110)/(65-81) 108/73 (05/08 0515) SpO2:  [100 %] 100 % (05/08 0515) Weight:  [47.5 kg-51.4 kg] 47.5 kg (05/08 0915) Last BM Date: 10/19/18 General:   Alert, appears more alert and animated today.    Abdomen: Nondistended.  Abdomen is quiet.  Minimal epigastric tenderness to palpation.  Extremities:  Without clubbing or edema.    Intake/Output from previous day: 05/07 0701 - 05/08 0700 In: 1653.4 [P.O.:480; I.V.:1141.1; NG/GT:32.3] Out: -  Intake/Output this shift: No intake/output data recorded.  Lab Results: Recent Labs    10/17/18 0454 10/18/18 0444  WBC 3.5* 3.6*  HGB 7.9* 7.8*  HCT 23.8* 23.7*  PLT 206 167   BMET Recent Labs    10/17/18 0454 10/18/18 0444 10/19/18 0529  NA 139 140 140  K 3.0* 3.0* 4.5  CL 106 104 108  CO2 26 30 26   GLUCOSE 136* 136* 119*  BUN 15 16 10   CREATININE <0.30* <0.30* <0.30*  CALCIUM 6.3* 6.1* 6.7*   LFT Recent Labs    10/18/18 0444  PROT 4.3*  ALBUMIN 1.9*  AST 21  ALT 17  ALKPHOS 64  BILITOT 0.2*   PT/INR No results for input(s): LABPROT, INR in the last 72 hours. Hepatitis Panel No results for input(s): HEPBSAG, HCVAB, HEPAIGM, HEPBIGM in the last 72 hours. C-Diff No results for input(s): CDIFFTOX in the last 72 hours.  Studies/Results: Dg Naso G Tube Plc W/fl W/rad  Result Date: 10/17/2018 CLINICAL DATA:  Feeding tube placement, chronic pancreatitis, weight loss, failure to thrive EXAM: NASO G TUBE PLACEMENT WITH FL AND WITH RAD CONTRAST:  15 mL ISOVUE-300 IOPAMIDOL (ISOVUE-300) INJECTION 61% FLUOROSCOPY TIME:  Fluoroscopy Time:  3 minutes 12  seconds Radiation Exposure Index (if provided by the fluoroscopic device): 33.9 mGy Number of Acquired Spot Images: Single cine fluoroscopic series COMPARISON:  CT abdomen and pelvis 10/14/2018 FINDINGS: Pharynx was anesthetized with Cetacaine spray. Utilizing lubricant, a feeding tube was passed through the RIGHT nasal passages in the hypopharynx. With patient swallowing and neck flexed, feeding tube was advanced into the thoracic esophagus and down into the stomach. Catheter was manipulated and placed across the pylorus into the duodenum. Catheter was advanced to the level of ligament of Treitz. Contrast injection confirms presence of the catheter tip at the ligament of Treitz. IMPRESSION: Placement of nasogastric feeding tube to the ligament of Treitz. Electronically Signed   By: Ulyses Southward M.D.   On: 10/17/2018 18:07   Impression: Acute on chronic pancreatitis with multiple pseudocyst and secondary failure to thrive.  NJ feedings going well thus far.  Progressing to goal (appreciate help from my the nutrition service)  In addition to NJ feedings, she will need to go home with a daily PPI and continue with pancreatic enzyme supplements.  Tertiary referral to be orchestrated next week.  Discussed with Dr. Sherryll Burger.

## 2018-10-19 NOTE — Progress Notes (Addendum)
Experiencing some nausea and diarrhea.  No vomiting. Gave compazine Related this to Dr. Sherryll Burger

## 2018-10-19 NOTE — Progress Notes (Addendum)
Nutrition Follow Up  RD working remotely.    DOCUMENTATION CODES:   Severe malnutrition in context of chronic illness, Underweight    INTERVENTION:  Increase Osmolite 1.5 @ 30cc/hr-continue  w/ Prostat 30 ml BID.  Provides: 1080 kcals, 75gr Pro and 547cc fluid.   IV fluids D5/NS @ 100 ml/hr started on 5/3- providing 170 kcal /liter or-408 kcal every 24 hrs. As previously noted if IVF's are changed or d/c'd  free water will need to be adjusted.   Patient is going to be discharged with the NJT when cleared by medical team. Continue to advance Osmolite 1.5 to goal rate of 40 ml/hr -provides 1440 kcal, 60 gr protein and 732 ml water daily. Add flushes free water 200 ml TID per tube. Adjust based on oral intake. See fluid goal below.  Monitor magnesium, potassium, and phosphorus daily for at least 3 days, MD to replete as needed, as pt is at risk for refeeding syndrome given her severely malnourished state  NUTRITION DIAGNOSIS:   Severe Malnutrition related to chronic illness(chronic abdominal pain, pancreatitis and appetite loss ) as evidenced by per patient/family report, percent weight loss, severe fat depletion, severe muscle depletion.   GOAL:  Pt to meet >/= 90% of their estimated nutrition needs     MONITOR:  Diet advancement, ability of patient to meet est needs and weight trends     REASON FOR ASSESSMENT:  Consult-Malnutrition-Adult Failure to Thrive  Tube feeding management   ASSESSMENT: Patient is an underweight chronically ill appearing 48 yo female who presents with c/o abdominal pain, wt loss, N/V, swallow problem. History of acholic pancreatitis, alcohol abuse, GERD, IBS and panic attacks.   CT abdomen and pelvis-findings: multiple cystic lesions throughout her pancreas. Esophagram - no acute findings  Meal intake is poor and has been since before February. February 15th  pt presents to ED c/o appetite change, wt loss which had been a problem at least 2 months  prior. She was d/c AMA at that time. Today During RD visit patient lunch is here clear liquids - a couple of bites of jello taken and sips of Boost Breeze.  5/7- Tube feeding consult received. RD working remotely today due to COVID restrictions.  NJ tube placed yesterday. Discussed with PA and RN this morning. Patient is still only taking sips of meals but took protein modular well with juice this morning. Will continue protein orally for now.  5/8 Patient started tube feeds yesterday around 1700 pm per RN. She is tolerating well per RN report. Current weight requested to monitor for significant changes. Will advance rate slightly today. Patient has been receiving D5/NS since 5/3 and is currently providing appoximately 408 kcal/day. Meal intake of 25% yesterday full liquids- providing minimal contribution to nutrient intake. Calorie count discontinued. RD working remotely due to COVID restrictions and unable to calculate results.   According to hospital records-she weighed 61.2 kg December 2019 and  59.9 kg recorded in February and down to admisson wt of 36.7 kg. Weight obtained by RN this morning 5/8 and reported verbally to RD at 47.5 kg. No edema noted by RN. Overall weight change now 21% in less than 6 months which remains severe. Re-estimated current nutrition needs.   Medications reviewed and include: folic acid, MVI, B-1, Protonix   Labs:  BMP Latest Ref Rng & Units 10/19/2018 10/18/2018 10/17/2018  Glucose 70 - 99 mg/dL 765(Y) 650(P) 546(F)  BUN 6 - 20 mg/dL 10 16 15   Creatinine 0.44 - 1.00  mg/dL <1.02(V<0.30(L) <2.53(G<0.30(L) <6.44(I<0.30(L)  Sodium 135 - 145 mmol/L 140 140 139  Potassium 3.5 - 5.1 mmol/L 4.5 3.0(L) 3.0(L)  Chloride 98 - 111 mmol/L 108 104 106  CO2 22 - 32 mmol/L 26 30 26   Calcium 8.9 - 10.3 mg/dL 6.7(L) 6.1(LL) 6.3(LL)     NUTRITION - FOCUSED PHYSICAL EXAM:    Most Recent Value  Orbital Region  Severe depletion  Upper Arm Region  Severe depletion  Thoracic and Lumbar Region  Severe  depletion  Buccal Region  Moderate depletion  Temple Region  Moderate depletion  Clavicle Bone Region  Severe depletion  Clavicle and Acromion Bone Region  Severe depletion  Scapular Bone Region  Severe depletion  Dorsal Hand  Mild depletion  Patellar Region  Severe depletion  Anterior Thigh Region  Severe depletion  Posterior Calf Region  Severe depletion  Edema (RD Assessment)  None  Hair  Unable to assess  Eyes  Reviewed  Mouth  Reviewed  Skin  Reviewed [tatoos accross fingers of both hands]      Diet Order:   Diet Order            Diet full liquid Room service appropriate? Yes; Fluid consistency: Thin  Diet effective now              EDUCATION NEEDS:   Education needs have been addressed(High Calorie High Protein ) Skin:  Skin Assessment: Reviewed RN Assessment  Last BM:  5/6 small  Height:   Ht Readings from Last 1 Encounters:  10/14/18 5\' 1"  (1.549 m)    Weight:   Wt Readings from Last 1 Encounters:  10/19/18 51.4 kg    Ideal Body Weight:  48 kg  BMI:  Body mass index is 21.41 kg/m.  Estimated Nutritional Needs:   Kcal:  1536-1680 (32-35 kcal/kg/bw)  Protein:  72-82 gr (1.5-1.7 gr/kg/bw)  Fluid:  1400-1500 ml/daily  Royann ShiversLynn Susana Gripp MS,RD,CSG,LDN Office: [phone] Pager: [phone]

## 2018-10-19 NOTE — Progress Notes (Signed)
PROGRESS NOTE    Monique Nguyen  EVO:[id number] DOB: 02-Aug-1970 DOA: 10/14/2018 PCP: Patient, No Pcp Per   Brief Narrative:  Per HPI: 48 y.o.femalewith past medical history significant for tobacco abuse, depression and/anxiety, gastroesophageal reflux disease, history of alcoholic pancreatitis and psoriasis; who presented to the hospital secondary to increased abdominal pain, nausea, vomiting and inability to eat. Patient reports that for the last month she has no being able to eat essentially anything (including having trouble even with liquids). She has no significant amount of weight and expressed increased abdominal pain. Pain is localized in her epigastric area/mid section, radiated to the left side of back, 10 out of 10 in intensity, no alleviating factors, associated with anorexia, nausea and intermittent episodes of vomiting (there has not been any blood seen in the vomit content). Patient denies any fever, chills, chest pain, shortness of breath, hematuria, dysuria, melena, hematochezia, focal weakness or any other complaints.  There has not been any sick contacts or recent travels.  Patient has been admitted with acute on chronic pancreatitis in the setting of some failure to thrive. She has undergone EGD on 5/5 which was unremarkable.   She has undergone placement of NJ tube on 5/6.  She is now working on advancement to her goal rate today and is having further electrolyte supplementation, but is having slow improvements.  CSW working on home health and tube feeds.  Assessment & Plan:   Principal Problem:   Acute on chronic pancreatitis (HCC) Active Problems:   Depression   Hypokalemia   Adult failure to thrive   GERD (gastroesophageal reflux disease)   Dehydration   Chronic pancreatitis (HCC)   Dysphagia   Protein-calorie malnutrition, severe   1-acute on chronic pancreatitis (HCC)-improved -Continue as needed pain medication, antiemetics and supportive care -DC  IV fluid today -CT scan done 5/5 demonstrated development of pseudocyst creating pressure in her esophagus and most likely being responsible for chronic abdominal pain and difficulty swallowing. -Endoscopy 5/5 which was unremarkable -NJ tube placed on 5/6 and patient is tolerating slow rate of tube feeds which are being advanced by nutrition today.   Appreciate further GI recommendations today. -Patient will require tertiary level care for endoscopic ultrasound and ERCP with removal of stones in the near future.  2-Depression/anxiety -Will need outpatient follow-up with psychiatry service -No suicidal ideation or hallucination -Patient reports no using any antidepressant prior to admission. -Continue anxiolytics as needed  3-Hypokalemia/hypomagnesemia -Improving -DC IV fluid today and replete magnesium and recheck in a.m.   4-Adult failure to thrive/severe protein calorie malnutrition -Will follow recommendations by dietitian regarding feeding supplements. -Most likely associated with chronic pancreatitis -Continue on Creon -Body mass index is 15.29 kg/m. -Status post NJ tube yesterday.  Appreciate nutrition recommendations with advancement of tube feedings today.  5-GERD (gastroesophageal reflux disease) -Continue PPI  6-Dehydration -Resolved.  Will hold IV fluid at this point.  7-Dysphagia -Advance to full liquids with EGD performed 5/5 with no gastric outlet obstruction noted -Monitor calorie count  8-tobacco abuse -Cessation counseling has been provided -Continue nicotine patch.  9-thrush -Will treat with nystatin  10-hypocalcemia- improving -Maintain on calcium carbonate 3 times daily as prescribed for now. -Monitor repeat labs  DVT prophylaxis:Heparin Code Status:Full code Family Communication:No family at bedside. Disposition Plan:Advance tube feeds per nutrition recommendations until at goal and ensure that this is well tolerated.  Appreciate  further GI recommendations.  Replete magnesium and recheck labs in a.m.  Anticipate discharge once goal rate has been met  and patient tolerating.  We will [facility] at [facility] for further GI care in the near future.  Consultants:  Gastroenterology  Procedures:  See below for x-ray reports.  Antimicrobials:    Anti-infectives(From admission, onward)   None      Subjective: Patient seen and evaluated today with no new acute complaints or concerns. No acute concerns or events noted overnight.  She appears to be tolerating her tube feeds as well as some of her full liquid diet, but is still not meeting her caloric requirements.  Objective: Vitals:   10/18/18 0508 10/18/18 1327 10/18/18 2106 10/19/18 0515  BP: 97/67 110/81 92/65 108/73  Pulse: (!) 110 (!) 107 (!) 111 (!) 110  Resp: '16 16 16 16  ' Temp: 97.8 F (36.6 C) 98.6 F (37 C) 98.4 F (36.9 C) 97.6 F (36.4 C)  TempSrc: Oral Oral Oral Oral  SpO2: 99% 100% 100% 100%  Weight:    51.4 kg  Height:        Intake/Output Summary (Last 24 hours) at 10/19/2018 0955 Last data filed at 10/18/2018 1842 Gross per 24 hour  Intake 1413.39 ml  Output --  Net 1413.39 ml   Filed Weights   10/14/18 0759 10/19/18 0515  Weight: 36.7 kg 51.4 kg    Examination:  General exam: Appears calm and comfortable  Respiratory system: Clear to auscultation. Respiratory effort normal. Cardiovascular system: S1 & S2 heard, RRR. No JVD, murmurs, rubs, gallops or clicks. No pedal edema. Gastrointestinal system: Abdomen is nondistended, soft and nontender. No organomegaly or masses felt. Normal bowel sounds heard.  NG tube present with tube feeds at 20 cc/h. Central nervous system: Alert and oriented. No focal neurological deficits. Extremities: Symmetric 5 x 5 power. Skin: No rashes, lesions or ulcers Psychiatry: Judgement and insight appear normal. Mood & affect appropriate.     Data Reviewed: I have personally reviewed following labs  and imaging studies  CBC: Recent Labs  Lab 10/14/18 0818 10/15/18 0506 10/17/18 0454 10/18/18 0444  WBC 7.1 4.3 3.5* 3.6*  NEUTROABS 5.8  --   --   --   HGB 13.4 9.1* 7.9* 7.8*  HCT 37.4 26.5* 23.8* 23.7*  MCV 91.2 94.3 97.1 97.9  PLT 579* 287 206 638   Basic Metabolic Panel: Recent Labs  Lab 10/14/18 0818 10/14/18 1033 10/15/18 0506 10/17/18 0454 10/18/18 0444 10/19/18 0529  NA 134*  --  137 139 140 140  K 2.2*  --  3.2* 3.0* 3.0* 4.5  CL 98  --  109 106 104 108  CO2 19*  --  '22 26 30 26  ' GLUCOSE 146*  --  177* 136* 136* 119*  BUN 53*  --  30* '15 16 10  ' CREATININE 0.73  --  0.43* <0.30* <0.30* <0.30*  CALCIUM 9.9  --  7.7* 6.3* 6.1* 6.7*  MG 2.5*  --   --   --  0.9* 1.6*  PHOS  --  1.8*  --   --   --   --    GFR: CrCl cannot be calculated (This lab value cannot be used to calculate CrCl because it is not a number: <0.30). Liver Function Tests: Recent Labs  Lab 10/14/18 0818 10/15/18 0506 10/18/18 0444  AST 59* 41 21  ALT 33 27 17  ALKPHOS 154* 101 64  BILITOT 1.5* 0.4 0.2*  PROT 8.5* 5.3* 4.3*  ALBUMIN 4.1 2.4* 1.9*   Recent Labs  Lab 10/14/18 0818  LIPASE 76*   No results for  input(s): AMMONIA in the last 168 hours. Coagulation Profile: No results for input(s): INR, PROTIME in the last 168 hours. Cardiac Enzymes: No results for input(s): CKTOTAL, CKMB, CKMBINDEX, TROPONINI in the last 168 hours. BNP (last 3 results) No results for input(s): PROBNP in the last 8760 hours. HbA1C: No results for input(s): HGBA1C in the last 72 hours. CBG: No results for input(s): GLUCAP in the last 168 hours. Lipid Profile: No results for input(s): CHOL, HDL, LDLCALC, TRIG, CHOLHDL, LDLDIRECT in the last 72 hours. Thyroid Function Tests: No results for input(s): TSH, T4TOTAL, FREET4, T3FREE, THYROIDAB in the last 72 hours. Anemia Panel: No results for input(s): VITAMINB12, FOLATE, FERRITIN, TIBC, IRON, RETICCTPCT in the last 72 hours. Sepsis Labs: No results  for input(s): PROCALCITON, LATICACIDVEN in the last 168 hours.  Recent Results (from the past 240 hour(s))  SARS Coronavirus 2 (CEPHEID - Performed in [place] hospital lab), Hosp Order     Status: None   Collection Time: 10/14/18  8:11 AM  Result Value Ref Range Status   SARS Coronavirus 2 NEGATIVE NEGATIVE Final    Comment: (NOTE) If result is NEGATIVE SARS-CoV-2 target nucleic acids are NOT DETECTED. The SARS-CoV-2 RNA is generally detectable in upper and lower  respiratory specimens during the acute phase of infection. The lowest  concentration of SARS-CoV-2 viral copies this assay can detect is 250  copies / mL. A negative result does not preclude SARS-CoV-2 infection  and should not be used as the sole basis for treatment or other  patient management decisions.  A negative result may occur with  improper specimen collection / handling, submission of specimen other  than nasopharyngeal swab, presence of viral mutation(s) within the  areas targeted by this assay, and inadequate number of viral copies  (<250 copies / mL). A negative result must be combined with clinical  observations, patient history, and epidemiological information. If result is POSITIVE SARS-CoV-2 target nucleic acids are DETECTED. The SARS-CoV-2 RNA is generally detectable in upper and lower  respiratory specimens dur ing the acute phase of infection.  Positive  results are indicative of active infection with SARS-CoV-2.  Clinical  correlation with patient history and other diagnostic information is  necessary to determine patient infection status.  Positive results do  not rule out bacterial infection or co-infection with other viruses. If result is PRESUMPTIVE POSTIVE SARS-CoV-2 nucleic acids MAY BE PRESENT.   A presumptive positive result was obtained on the submitted specimen  and confirmed on repeat testing.  While 2019 novel coronavirus  (SARS-CoV-2) nucleic acids may be present in the submitted  sample  additional confirmatory testing may be necessary for epidemiological  and / or clinical management purposes  to differentiate between  SARS-CoV-2 and other Sarbecovirus currently known to infect humans.  If clinically indicated additional testing with an alternate test  methodology [phone]) is advised. The SARS-CoV-2 RNA is generally  detectable in upper and lower respiratory sp ecimens during the acute  phase of infection. The expected result is Negative. Fact Sheet for Patients:  StrictlyIdeas.no Fact Sheet for Healthcare Providers: BankingDealers.co.za This test is not yet approved or cleared by the [place] FDA and has been authorized for detection and/or diagnosis of SARS-CoV-2 by FDA under an Emergency Use Authorization (EUA).  This EUA will remain in effect (meaning this test can be used) for the duration of the COVID-19 declaration under Section 564(b)(1) of the Act, 21 U.S.C. section 360bbb-3(b)(1), unless the authorization is terminated or revoked sooner. Performed at [facility], [address] [place], [place] [address]   Culture, Urine     Status: None   Collection Time: 10/14/18  5:23 PM  Result Value Ref Range Status   Specimen Description   Final    URINE, CLEAN CATCH Performed at [facility], [address] [place], [place] [address]    Special Requests   Final    NONE Performed at [facility], [address], [place], [place] [address]    Culture   Final    NO GROWTH Performed at [facility], [place] [address], [place], [place] [address]    Report Status 10/16/2018 FINAL  Final         Radiology Studies: Dg Loyce Dys Tube Plc W/fl W/rad  Result Date: 10/17/2018 CLINICAL DATA:  Feeding tube placement, chronic pancreatitis, weight loss, failure to thrive EXAM: NASO G TUBE PLACEMENT WITH FL AND WITH RAD CONTRAST:  15 mL ISOVUE-300 IOPAMIDOL (ISOVUE-300) INJECTION 61% FLUOROSCOPY TIME:   Fluoroscopy Time:  3 minutes 12 seconds Radiation Exposure Index (if provided by the fluoroscopic device): 33.9 mGy Number of Acquired Spot Images: Single cine fluoroscopic series COMPARISON:  CT abdomen and pelvis 10/14/2018 FINDINGS: Pharynx was anesthetized with Cetacaine spray. Utilizing lubricant, a feeding tube was passed through the RIGHT nasal passages in the hypopharynx. With patient swallowing and neck flexed, feeding tube was advanced into the thoracic esophagus and down into the stomach. Catheter was manipulated and placed across the pylorus into the duodenum. Catheter was advanced to the level of ligament of Treitz. Contrast injection confirms presence of the catheter tip at the ligament of Treitz. IMPRESSION: Placement of nasogastric feeding tube to the ligament of Treitz. Electronically Signed   By: Lavonia Dana M.D.   On: 10/17/2018 18:07        Scheduled Meds:  butamben-tetracaine-benzocaine  1 spray Topical Once   calcium carbonate (dosed in mg elemental calcium)  1,000 mg of elemental calcium Oral TID WC   feeding supplement  1 Container Oral TID BM   feeding supplement (PRO-STAT SUGAR FREE 64)  30 mL Oral BID   folic acid  1 mg Intravenous Daily   heparin injection (subcutaneous)  5,000 Units Subcutaneous Q8H   lipase/protease/amylase  24,000 Units Oral With snacks   lipase/protease/amylase  36,000 Units Oral TID WC   multivitamin  15 mL Oral Daily   nicotine  21 mg Transdermal Daily   nystatin  5 mL Oral QID   pantoprazole  40 mg Oral Daily   thiamine injection  100 mg Intravenous Daily   Continuous Infusions:  feeding supplement (OSMOLITE 1.5 CAL)       LOS: 5 days    Time spent: 30 minutes    Ronit Marczak Darleen Crocker, DO [facility] Pager [phone]  If 7PM-7AM, please contact night-coverage [email or web address] Password [facility] 10/19/2018, 9:55 AM

## 2018-10-19 NOTE — TOC Transition Note (Addendum)
Transition of Care [facility]) - CM/SW Discharge Note   Patient Details  Name: Monique Nguyen MRN: [id number] Date of Birth: 1971-02-18  [facility]) CM/SW Contact:  Annice Needy, LCSW Phone Number: 10/19/2018, 3:21 PM   Clinical Narrative:    Eugenio Hoes with [facility] advised of tentative plan for discharge on 10/20/18. She indicated that patient had been approved for charity and they could start. Olegario Messier with [employer] advised of planned discharge and infusion orders provided by attending.  Patient has a telephone appointment on 10/25/2018 @ 2:30 with [facility] to establish PCP care.      Barriers to Discharge: Inadequate or no insurance   Patient Goals and CMS Choice        Discharge Placement                       Discharge Plan and Services In-house Referral: Clinical Social Work                                   Social Determinants of Health (SDOH) Interventions     Readmission Risk Interventions Readmission Risk Prevention Plan 10/17/2018 10/16/2018  Transportation Screening - Complete  PCP or Specialist Appt within 3-5 Days Complete -  HRI or Home Care Consult - Complete  Social Work Consult for Recovery Care Planning/Counseling - Complete  Palliative Care Screening - Complete  Medication Review [employer]) - Complete  Some recent data might be hidden

## 2018-10-20 LAB — BASIC METABOLIC PANEL
Anion gap: 8 (ref 5–15)
BUN: 11 mg/dL (ref 6–20)
CO2: 26 mmol/L (ref 22–32)
Calcium: 7.7 mg/dL — ABNORMAL LOW (ref 8.9–10.3)
Chloride: 102 mmol/L (ref 98–111)
Creatinine, Ser: <0.3 mg/dL — ABNORMAL LOW (ref 0.44–1.00)
Glucose, Bld: 126 mg/dL — ABNORMAL HIGH (ref 70–99)
Potassium: 4.5 mmol/L (ref 3.5–5.1)
Sodium: 136 mmol/L (ref 135–145)

## 2018-10-20 LAB — MAGNESIUM: Magnesium: 1.9 mg/dL (ref 1.7–2.4)

## 2018-10-20 MED ORDER — FREE WATER: 200.0000 mL | Freq: Three times a day (TID) | Status: DC

## 2018-10-20 MED ORDER — DEXTROSE-NACL 5-0.45 % IV SOLN
INTRAVENOUS | Status: DC
  Administered 2018-10-20 – 2018-10-22 (×4): via INTRAVENOUS

## 2018-10-20 MED ORDER — KETOROLAC TROMETHAMINE 15 MG/ML IJ SOLN
15.0000 mg | Freq: Three times a day (TID) | INTRAMUSCULAR | Status: DC | PRN
  Administered 2018-10-20 – 2018-10-22 (×3): 15 mg via INTRAVENOUS
  Filled 2018-10-20 (×3): qty 1

## 2018-10-20 NOTE — Progress Notes (Signed)
NG tube leaking in right nare. Informed Dr. Sherryll Burger and Dr. Jena Gauss. Dr. Jena Gauss saw patient and pulled tube. Surgery reconsulted for peg tube placement. Dr. Sherryll Burger notified of new consult.

## 2018-10-20 NOTE — Progress Notes (Signed)
Called about NJ tube leakage out of her right nares. I came and examined the PEG tube.  It had been pulled out all but the weighted tip sitting in heranterior anterior nares (only about 1 inch remaining inside patient). I discarded tube.  Trial of nasoenteral feedings as shown [place] she can tolerate 30 cc an hour and probably was tolerating 40 cc an hour until tube dislodgment.  This lady needs a prolonged course of enteral feedings.  We should go ahead and plan to have surgery come and place a PEG tube  I have discussed this approach with the patient at length and she is agreeable.  I have discussed with Dr. Sherryll Burger.  Surgery consult pending.

## 2018-10-20 NOTE — Progress Notes (Signed)
PROGRESS NOTE    Monique Nguyen  SVX:[id number] DOB: 12-Mar-1971 DOA: 10/14/2018 PCP: Patient, No Pcp Per   Brief Narrative:  Per HPI: 48 y.o.femalewith past medical history significant for tobacco abuse, depression and/anxiety, gastroesophageal reflux disease, history of alcoholic pancreatitis and psoriasis; who presented to the hospital secondary to increased abdominal pain, nausea, vomiting and inability to eat. Patient reports that for the last month she has no being able to eat essentially anything (including having trouble even with liquids). She has no significant amount of weight and expressed increased abdominal pain. Pain is localized in her epigastric area/mid section, radiated to the left side of back, 10 out of 10 in intensity, no alleviating factors, associated with anorexia, nausea and intermittent episodes of vomiting (there has not been any blood seen in the vomit content). Patient denies any fever, chills, chest pain, shortness of breath, hematuria, dysuria, melena, hematochezia, focal weakness or any other complaints.  There has not been any sick contacts or recent travels.  Patient has been admitted with acute on chronic pancreatitis in the setting of some failure to thrive. She has undergone EGD on 5/5 which was unremarkable.She has undergone placement of NJ tube on 5/6.  She is now working on advancement to her goal rate today and is having further electrolyte supplementation, but is having slow improvements.  CSW working on home health and tube feeds.   Assessment & Plan:   Principal Problem:   Acute on chronic pancreatitis (HCC) Active Problems:   Depression   Hypokalemia   Adult failure to thrive   GERD (gastroesophageal reflux disease)   Dehydration   Chronic pancreatitis (HCC)   Dysphagia   Protein-calorie malnutrition, severe   1-acute on chronic pancreatitis (HCC)-improved -Continue as needed pain medication, antiemetics and supportive care  -CT scan done5/5demonstrated development of pseudocyst creating pressure in her esophagus and most likely being responsible for chronic abdominal pain and difficulty swallowing. -Endoscopy 5/5 which was unremarkable -NJ tube placed on 5/6 and patient is tolerating slow rate of tube feeds which are being advanced by nutrition today.  Appreciate further GI recommendations today. -Patient will require tertiary level care for endoscopic ultrasound and ERCP with removal of stones in the near future. -Advance tube feeds to goal rate of 40 cc/h today and start free water.  2-Depression/anxiety -Will need outpatient follow-up with psychiatry service -No suicidal ideation or hallucination -Patient reports no using any antidepressant prior to admission. -Continue anxiolytics as needed  3-Hypokalemia/hypomagnesemia -Now resolved and will monitor in a.m.   4-Adult failure to thrive/severe protein calorie malnutrition -Will follow recommendations by dietitian regarding feeding supplements. -Most likely associated with chronic pancreatitis -Continue on Creon -Body mass index is 15.29 kg/m. -Status post NJ tube yesterday. Appreciate nutrition recommendations with advancement of tube feedings today.  5-GERD (gastroesophageal reflux disease) -Continue PPI  6-Dehydration -Resolved.  Will hold IV fluid at this point.  7-Dysphagia -Advance to full liquids with EGD performed 5/5 with no gastric outlet obstruction noted -Monitor calorie count  8-tobacco abuse -Cessation counseling has been provided -Continue nicotine patch.  9-thrush -Will treat with nystatin  10-hypocalcemia- improving -Maintain on calcium carbonate 3 times daily as prescribed for now. -Monitor repeat labs  DVT prophylaxis:Heparin Code Status:Full code Family Communication:No family at bedside. Disposition Plan:Advance tube feeds per nutrition recommendations until at goal and ensure that this is well  tolerated.  Appreciate further GI recommendations.    Repeat labs in a.m.  Anticipate discharge once goal rate has been met and patient  tolerating-hopefully in the next 24 hours.  We will follow-up at [facility] for further GI care in the near future.  Consultants:  Gastroenterology  Procedures:  See below for x-ray reports.  Antimicrobials:    Anti-infectives(From admission, onward)   None    Subjective: Patient seen and evaluated today with some ongoing sensation of nausea and some mild diarrhea overnight and early into this morning.  She is complaining of some chest and upper back pain as well that has been worsening since last night.  Objective: Vitals:   10/19/18 0915 10/19/18 1448 10/19/18 2140 10/20/18 0530  BP:  100/69 105/86 91/66  Pulse:  (!) 117 (!) 102 (!) 112  Resp:  17    Temp:  98.3 F (36.8 C) 97.6 F (36.4 C) 97.8 F (36.6 C)  TempSrc:  Oral Oral Oral  SpO2:  100% 100% 100%  Weight: 47.5 kg   43.4 kg  Height: '5\' 1"'  (1.549 m)       Intake/Output Summary (Last 24 hours) at 10/20/2018 1023 Last data filed at 10/20/2018 0900 Gross per 24 hour  Intake 480 ml  Output -  Net 480 ml   Filed Weights   10/19/18 0515 10/19/18 0915 10/20/18 0530  Weight: 51.4 kg 47.5 kg 43.4 kg    Examination:  General exam: Appears calm and comfortable  Respiratory system: Clear to auscultation. Respiratory effort normal. Cardiovascular system: S1 & S2 heard, RRR. No JVD, murmurs, rubs, gallops or clicks. No pedal edema. Gastrointestinal system: Abdomen is nondistended, soft and nontender. No organomegaly or masses felt. Normal bowel sounds heard. Central nervous system: Alert and oriented. No focal neurological deficits. Extremities: Symmetric 5 x 5 power. Skin: No rashes, lesions or ulcers Psychiatry: Judgement and insight appear normal. Mood & affect appropriate.     Data Reviewed: I have personally reviewed following labs and imaging studies  CBC: Recent Labs   Lab 10/14/18 0818 10/15/18 0506 10/17/18 0454 10/18/18 0444  WBC 7.1 4.3 3.5* 3.6*  NEUTROABS 5.8  --   --   --   HGB 13.4 9.1* 7.9* 7.8*  HCT 37.4 26.5* 23.8* 23.7*  MCV 91.2 94.3 97.1 97.9  PLT 579* 287 206 712   Basic Metabolic Panel: Recent Labs  Lab 10/14/18 0818 10/14/18 1033 10/15/18 0506 10/17/18 0454 10/18/18 0444 10/19/18 0529 10/20/18 0553  NA 134*  --  137 139 140 140 136  K 2.2*  --  3.2* 3.0* 3.0* 4.5 4.5  CL 98  --  109 106 104 108 102  CO2 19*  --  '22 26 30 26 26  ' GLUCOSE 146*  --  177* 136* 136* 119* 126*  BUN 53*  --  30* '15 16 10 11  ' CREATININE 0.73  --  0.43* <0.30* <0.30* <0.30* <0.30*  CALCIUM 9.9  --  7.7* 6.3* 6.1* 6.7* 7.7*  MG 2.5*  --   --   --  0.9* 1.6* 1.9  PHOS  --  1.8*  --   --   --   --   --    GFR: CrCl cannot be calculated (This lab value cannot be used to calculate CrCl because it is not a number: <0.30). Liver Function Tests: Recent Labs  Lab 10/14/18 0818 10/15/18 0506 10/18/18 0444  AST 59* 41 21  ALT 33 27 17  ALKPHOS 154* 101 64  BILITOT 1.5* 0.4 0.2*  PROT 8.5* 5.3* 4.3*  ALBUMIN 4.1 2.4* 1.9*   Recent Labs  Lab 10/14/18 0818  LIPASE 76*  No results for input(s): AMMONIA in the last 168 hours. Coagulation Profile: No results for input(s): INR, PROTIME in the last 168 hours. Cardiac Enzymes: No results for input(s): CKTOTAL, CKMB, CKMBINDEX, TROPONINI in the last 168 hours. BNP (last 3 results) No results for input(s): PROBNP in the last 8760 hours. HbA1C: No results for input(s): HGBA1C in the last 72 hours. CBG: No results for input(s): GLUCAP in the last 168 hours. Lipid Profile: No results for input(s): CHOL, HDL, LDLCALC, TRIG, CHOLHDL, LDLDIRECT in the last 72 hours. Thyroid Function Tests: No results for input(s): TSH, T4TOTAL, FREET4, T3FREE, THYROIDAB in the last 72 hours. Anemia Panel: No results for input(s): VITAMINB12, FOLATE, FERRITIN, TIBC, IRON, RETICCTPCT in the last 72 hours. Sepsis  Labs: No results for input(s): PROCALCITON, LATICACIDVEN in the last 168 hours.  Recent Results (from the past 240 hour(s))  SARS Coronavirus 2 (CEPHEID - Performed in [place] hospital lab), Hosp Order     Status: None   Collection Time: 10/14/18  8:11 AM  Result Value Ref Range Status   SARS Coronavirus 2 NEGATIVE NEGATIVE Final    Comment: (NOTE) If result is NEGATIVE SARS-CoV-2 target nucleic acids are NOT DETECTED. The SARS-CoV-2 RNA is generally detectable in upper and lower  respiratory specimens during the acute phase of infection. The lowest  concentration of SARS-CoV-2 viral copies this assay can detect is 250  copies / mL. A negative result does not preclude SARS-CoV-2 infection  and should not be used as the sole basis for treatment or other  patient management decisions.  A negative result may occur with  improper specimen collection / handling, submission of specimen other  than nasopharyngeal swab, presence of viral mutation(s) within the  areas targeted by this assay, and inadequate number of viral copies  (<250 copies / mL). A negative result must be combined with clinical  observations, patient history, and epidemiological information. If result is POSITIVE SARS-CoV-2 target nucleic acids are DETECTED. The SARS-CoV-2 RNA is generally detectable in upper and lower  respiratory specimens dur ing the acute phase of infection.  Positive  results are indicative of active infection with SARS-CoV-2.  Clinical  correlation with patient history and other diagnostic information is  necessary to determine patient infection status.  Positive results do  not rule out bacterial infection or co-infection with other viruses. If result is PRESUMPTIVE POSTIVE SARS-CoV-2 nucleic acids MAY BE PRESENT.   A presumptive positive result was obtained on the submitted specimen  and confirmed on repeat testing.  While 2019 novel coronavirus  (SARS-CoV-2) nucleic acids may be present in  the submitted sample  additional confirmatory testing may be necessary for epidemiological  and / or clinical management purposes  to differentiate between  SARS-CoV-2 and other Sarbecovirus currently known to infect humans.  If clinically indicated additional testing with an alternate test  methodology [phone]) is advised. The SARS-CoV-2 RNA is generally  detectable in upper and lower respiratory sp ecimens during the acute  phase of infection. The expected result is Negative. Fact Sheet for Patients:  StrictlyIdeas.no Fact Sheet for Healthcare Providers: BankingDealers.co.za This test is not yet approved or cleared by the [place] FDA and has been authorized for detection and/or diagnosis of SARS-CoV-2 by FDA under an Emergency Use Authorization (EUA).  This EUA will remain in effect (meaning this test can be used) for the duration of the COVID-19 declaration under Section 564(b)(1) of the Act, 21 U.S.C. section 360bbb-3(b)(1), unless the authorization is terminated or revoked sooner.  Performed at [facility], [address], [place], [place] [address]   Culture, Urine     Status: None   Collection Time: 10/14/18  5:23 PM  Result Value Ref Range Status   Specimen Description   Final    URINE, CLEAN CATCH Performed at [facility], [address], [place], [place] [address]    Special Requests   Final    NONE Performed at [facility], [address], [place], [place] [address]    Culture   Final    NO GROWTH Performed at [facility], [place] [address] [place], [place] [address]    Report Status 10/16/2018 FINAL  Final         Radiology Studies: No results found.      Scheduled Meds: . butamben-tetracaine-benzocaine  1 spray Topical Once  . calcium carbonate (dosed in mg elemental calcium)  1,000 mg of elemental calcium Oral TID WC  . feeding supplement  1 Container Oral TID BM  . feeding supplement  (PRO-STAT SUGAR FREE 64)  30 mL Oral BID  . folic acid  1 mg Intravenous Daily  . free water  200 mL Per Tube Q8H  . heparin injection (subcutaneous)  5,000 Units Subcutaneous Q8H  . lipase/protease/amylase  24,000 Units Oral With snacks  . lipase/protease/amylase  36,000 Units Oral TID WC  . multivitamin  15 mL Oral Daily  . nicotine  21 mg Transdermal Daily  . nystatin  5 mL Oral QID  . pantoprazole  40 mg Oral Daily  . thiamine injection  100 mg Intravenous Daily   Continuous Infusions: . feeding supplement (OSMOLITE 1.5 CAL) 30 mL/hr at 10/19/18 1006     LOS: 6 days    Time spent: 30 minutes    Keigo Whalley Darleen Crocker, DO [facility] Pager [phone]  If 7PM-7AM, please contact night-coverage [email or web address] Password [email or web address] 10/20/2018, 10:23 AM

## 2018-10-20 NOTE — Progress Notes (Signed)
Tolerating her tube feeds well.  Rate advance to goal of 40 cc / hour this morning. Abdominal pain controlled with Toradol.   Vital signs in last 24 hours: Temp:  [97.6 F (36.4 C)-98.3 F (36.8 C)] 97.8 F (36.6 C) (05/09 0530) Pulse Rate:  [102-117] 112 (05/09 0530) Resp:  [17] 17 (05/08 1448) BP: (91-105)/(66-86) 91/66 (05/09 0530) SpO2:  [100 %] 100 % (05/09 0530) Weight:  [43.4 kg] 43.4 kg (05/09 0530) Last BM Date: 10/19/18 General:   Alert.  More animated.  Appears to be in no acute distress.  Cooperative in NAD Abdomen: Nondistended.  Soft, to moderate epigastric tenderness.     Intake/Output from previous day: 05/08 0701 - 05/09 0700 In: 240 [NG/GT:240] Out: -  Intake/Output this shift: Total I/O In: 240 [P.O.:240] Out: -   Lab Results: Recent Labs    10/18/18 0444  WBC 3.6*  HGB 7.8*  HCT 23.7*  PLT 167   BMET Recent Labs    10/18/18 0444 10/19/18 0529 10/20/18 0553  NA 140 140 136  K 3.0* 4.5 4.5  CL 104 108 102  CO2 30 26 26   GLUCOSE 136* 119* 126*  BUN 16 10 11   CREATININE <0.30* <0.30* <0.30*  CALCIUM 6.1* 6.7* 7.7*   LFT Recent Labs    10/18/18 0444  PROT 4.3*  ALBUMIN 1.9*  AST 21  ALT 17  ALKPHOS 64  BILITOT 0.2*   PT/INR No results for input(s): LABPROT, INR in the last 72 hours. Hepatitis Panel No results for input(s): HEPBSAG, HCVAB, HEPAIGM, HEPBIGM in the last 72 hours. C-Diff No results for input(s): CDIFFTOX in the last 72 hours.  Studies/Results: No results found.  Assessment: Principal Problem:   Acute on chronic pancreatitis (HCC) Active Problems:   Depression   Hypokalemia   Adult failure to thrive   GERD (gastroesophageal reflux disease)   Dehydration   Chronic pancreatitis (HCC)   Dysphagia   Protein-calorie malnutrition, severe   Impression: 48 year old lady with acute on chronic alcoholic pancreatitis complicated by pseudocyst formation and failure to thrive.  NJ feedings going very nicely at this  time.  They need to be continued. Quite a bit of lengthy tube coming out of her nose to the floor then back up to the pump -at risk to be tangled and dislodged.  Discussed with nursing staff.  Recommendations: If she tolerates goal feedings, she could probably be discharged in the next 24 hours. Continue PPI and pancreatic enzymes. Will need, initially, close outpatient home health nursing care.  Patient and staff admonished of the importance of maintaining/protecting the enteral feeding tube. Office staff to work on tertiary referral the first of the week.

## 2018-10-21 DIAGNOSIS — R1310 Dysphagia, unspecified: Secondary | ICD-10-CM

## 2018-10-21 LAB — MAGNESIUM: Magnesium: 1.8 mg/dL (ref 1.7–2.4)

## 2018-10-21 LAB — BASIC METABOLIC PANEL
Anion gap: 9 (ref 5–15)
BUN: 13 mg/dL (ref 6–20)
CO2: 27 mmol/L (ref 22–32)
Calcium: 8.2 mg/dL — ABNORMAL LOW (ref 8.9–10.3)
Chloride: 101 mmol/L (ref 98–111)
Creatinine, Ser: <0.3 mg/dL — ABNORMAL LOW (ref 0.44–1.00)
Glucose, Bld: 121 mg/dL — ABNORMAL HIGH (ref 70–99)
Potassium: 4.2 mmol/L (ref 3.5–5.1)
Sodium: 137 mmol/L (ref 135–145)

## 2018-10-21 MED ORDER — CHLORHEXIDINE GLUCONATE CLOTH 2 % EX PADS
6.0000 | MEDICATED_PAD | Freq: Once | CUTANEOUS | Status: AC
  Administered 2018-10-22: 6 via TOPICAL

## 2018-10-21 MED ORDER — CHLORHEXIDINE GLUCONATE CLOTH 2 % EX PADS
6.0000 | MEDICATED_PAD | Freq: Once | CUTANEOUS | Status: AC
  Administered 2018-10-21: 22:00:00 6 via TOPICAL

## 2018-10-21 MED ORDER — CEFAZOLIN SODIUM-DEXTROSE 2-4 GM/100ML-% IV SOLN
2.0000 g | INTRAVENOUS | Status: AC
  Administered 2018-10-22: 10:00:00 2 g via INTRAVENOUS
  Filled 2018-10-21: qty 100

## 2018-10-21 MED ORDER — CALCIUM CARBONATE ANTACID 1250 MG/5ML PO SUSP
1000.0000 mg | Freq: Two times a day (BID) | ORAL | Status: DC
  Administered 2018-10-21 – 2018-10-23 (×3): 1000 mg via ORAL
  Filled 2018-10-21 (×6): qty 10

## 2018-10-21 NOTE — H&P (View-Only) (Signed)
[facility] Consult  Reason for Consult: Malnutrition, po intolerance/ dysphagia related to pancreatic pseudocyst compression of EGJ  Referring Physician:  Dr. Shah   Chief Complaint    Failure To Thrive      Monique Nguyen is a 48 y.o. female.  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  Monique Nguyen has been having worsening abdominal pain, failure to thrive, 52 lb weight loss in the last 3 years, and dysphagia, odynophagia symptoms with nausea and vomiting. GI performed an EGD that demonstrated no abnormality internally of the esophagus, stomach and an open pylorus. Monique Nguyen has had a CT scan that demonstrated several small scattered pancreatic pseudocysts.  Monique Nguyen feels like the food gets "stuck" in her lower esophagus and Monique Nguyen spits up foamy liquid and food.  Monique Nguyen otherwise has a history of depression, anxiety, grief, and is tearful during our conversation. Monique Nguyen had a NJ placed by radiology and has tolerated the NJ feeds but this was dislodged. Monique Nguyen says it was only secured with a tape, and that Monique Nguyen does not remember it being pulled but Monique Nguyen did have a sneezing episode that could have dislodged it.    Past Medical History:  Diagnosis Date  . Alcohol abuse   . Anxiety   . Bacterial vaginosis   . Chronic abdominal pain   . Chronic pain   . Depression   . GERD (gastroesophageal reflux disease)   . IBS (irritable bowel syndrome)   . Pancreatitis    ETOH  . Panic attacks   . Psoriasis     Past Surgical History:  Procedure Laterality Date  . BLADDER SURGERY     X 3  . ESOPHAGOGASTRODUODENOSCOPY (EGD) WITH PROPOFOL N/A 10/16/2018   Procedure: ESOPHAGOGASTRODUODENOSCOPY (EGD) WITH PROPOFOL;  Surgeon: Rourk, Robert M, MD;  Location: [facility];  Service: Endoscopy;  Laterality: N/A;  . EUS  N/A 10/11/2012   Dr. Jacobs: 3.7 cm cystic lesion in body of pancreas with large amount of debris, s/p FNA with reddish, milky fluid, main pancreatic duct normal,   . EUS  12/2015   mild to moderate chronic pancreatitis, pancreatic duct with dilation and intraductal stones, measuring up to 6 mm in diameter, no abnormality in main bile duct, abnormal lymph nodes in perigastric region, largest measuring 12 mm in diameter, s/p fine needle biopsy. Pathology with multiple fragments of lymphoid tissue with granulomatous inflammation, negative GMS and AFB stains,    Family History  Problem Relation Age of Onset  . Depression Mother        living  . Pancreatic cancer Father        PATIENT STATES PROSTATE CANCER. UNCLEAR IF ACTUAL PANCREATIC CANCER  . Colon cancer Neg Hx   . Colon polyps Neg Hx     Social History   Tobacco Use  . Smoking status: Current Some Day Smoker    Packs/day: 1.00    Years: 8.00    Pack years: 8.00    Types: Cigarettes  . Smokeless tobacco: Never Used  . Tobacco comment: several cigarretes a day  Substance Use Topics  . Alcohol use: Yes    Alcohol/week: 3.0 standard drinks    Types: 3 Cans of beer per week    Comment: last drink 06/05/18, hx of ETOH abuse  . Drug   use: Not Currently    Types: Marijuana    Comment: last time used 1` month ago. DENIED DRUG USE  on 10/15/2018    Medications:  I have reviewed the patient's current medications. Prior to Admission:  No medications prior to admission.   Scheduled: . butamben-tetracaine-benzocaine  1 spray Topical Once  . calcium carbonate (dosed in mg elemental calcium)  1,000 mg of elemental calcium Oral BID WC  . feeding supplement  1 Container Oral TID BM  . feeding supplement (PRO-STAT SUGAR FREE 64)  30 mL Oral BID  . folic acid  1 mg Intravenous Daily  . free water  200 mL Per Tube Q8H  . heparin injection (subcutaneous)  5,000 Units Subcutaneous Q8H  . lipase/protease/amylase  24,000 Units Oral With snacks   . lipase/protease/amylase  36,000 Units Oral TID WC  . multivitamin  15 mL Oral Daily  . nicotine  21 mg Transdermal Daily  . nystatin  5 mL Oral QID  . pantoprazole  40 mg Oral Daily  . thiamine injection  100 mg Intravenous Daily   Continuous: . dextrose 5 % and 0.45% NaCl 75 mL/hr at 10/21/18 0448  . feeding supplement (OSMOLITE 1.5 CAL) 30 mL/hr at 10/19/18 1006   MBE:[id number], LORazepam, morphine injection, prochlorperazine  Allergies  Allergen Reactions  . Doxycycline Other (See Comments)    headaches  . Sulfa Antibiotics Nausea And Vomiting and Other (See Comments)    Blurred vision and headaches     ROS:  A comprehensive review of systems was negative except for: Constitutional: positive for weight loss and weakness Gastrointestinal: positive for abdominal pain, dysphagia, nausea, odynophagia, reflux symptoms and vomiting Alcohol abuse, drank fireball last month   Blood pressure 115/76, pulse (!) 116, temperature 98.7 F (37.1 C), temperature source Oral, resp. rate 16, height 5\' 1"  (1.549 m), weight 44.6 kg, last menstrual period 04/12/2013, SpO2 100 %. Physical Exam Vitals signs reviewed.  Constitutional:      General: Monique Nguyen is not in acute distress.    Appearance: Monique Nguyen is cachectic.  HENT:     Head: Normocephalic.     Nose: Nose normal.     Mouth/Throat:     Mouth: Mucous membranes are moist.  Eyes:     Pupils: Pupils are equal, round, and reactive to light.  Neck:     Musculoskeletal: Normal range of motion.  Cardiovascular:     Rate and Rhythm: Normal rate and regular rhythm.  Pulmonary:     Effort: Pulmonary effort is normal.  Abdominal:     General: There is distension.     Palpations: Abdomen is soft.     Tenderness: There is abdominal tenderness. There is no guarding or rebound.     Comments: Mildly distended, tender in the epigastric region  Musculoskeletal:        General: No swelling.  Skin:    General: Skin is warm and dry.  Neurological:      General: No focal deficit present.     Mental Status: Monique Nguyen is alert and oriented to person, place, and time.  Psychiatric:        Behavior: Behavior normal.        Thought Content: Thought content normal.        Judgment: Judgment normal.     Comments: Tearful      Results: Results for orders placed or performed during the hospital encounter of 10/14/18 (from the past 48 hour(s))  Basic metabolic panel     Status:  Abnormal   Collection Time: 10/20/18  5:53 AM  Result Value Ref Range   Sodium 136 135 - 145 mmol/L   Potassium 4.5 3.5 - 5.1 mmol/L   Chloride 102 98 - 111 mmol/L   CO2 26 22 - 32 mmol/L   Glucose, Bld 126 (H) 70 - 99 mg/dL   BUN 11 6 - 20 mg/dL   Creatinine, Ser <1.61 (L) 0.44 - 1.00 mg/dL   Calcium 7.7 (L) 8.9 - 10.3 mg/dL   GFR calc non Af Amer NOT CALCULATED >60 mL/min   GFR calc Af Amer NOT CALCULATED >60 mL/min   Anion gap 8 5 - 15    Comment: Performed at [facility], [address] [place], [place] [address]  Magnesium     Status: None   Collection Time: 10/20/18  5:53 AM  Result Value Ref Range   Magnesium 1.9 1.7 - 2.4 mg/dL    Comment: Performed at [facility], [address], [place], [place] [address]  Basic metabolic panel     Status: Abnormal   Collection Time: 10/21/18  5:59 AM  Result Value Ref Range   Sodium 137 135 - 145 mmol/L   Potassium 4.2 3.5 - 5.1 mmol/L   Chloride 101 98 - 111 mmol/L   CO2 27 22 - 32 mmol/L   Glucose, Bld 121 (H) 70 - 99 mg/dL   BUN 13 6 - 20 mg/dL   Creatinine, Ser <1.19 (L) 0.44 - 1.00 mg/dL   Calcium 8.2 (L) 8.9 - 10.3 mg/dL   GFR calc non Af Amer NOT CALCULATED >60 mL/min   GFR calc Af Amer NOT CALCULATED >60 mL/min   Anion gap 9 5 - 15    Comment: Performed at [facility], [address], [place], [place] [address]  Magnesium     Status: None   Collection Time: 10/21/18  5:59 AM  Result Value Ref Range   Magnesium 1.8 1.7 - 2.4 mg/dL    Comment: Performed at [facility], [address],  [place], [place] [address]    CT scan Personally Reviewed small fluid collections around the stomach and EGJ with some mild compression at the EGJ, no gastric outlet obstruction   IMPRESSION: 1. 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 Redemonstrated findings of chronic pancreatitis in the distal body and tail of the pancreas. 2. New irregular thick walled fluid collections encasing the esophagogastric junction anteriorly and posteriorly with associated wall thickening throughout the esophagogastric junction. Similar new irregular thick walled fluid collection along the posterior margin of the antropyloric stomach with some mass effect on the distal stomach, which is not significantly distended. These nonspecific findings also presumably represent pancreatic pseudocysts with reactive wall thickening at the esophagogastric junction and distal stomach. Infected collections cannot be excluded. 3. Close CT or MRI abdomen follow-up imaging advised. 4. No biliary ductal dilatation.  No radiopaque cholelithiasis. 5. Nonspecific small volume hemoperitoneum in the pelvic cul-de-sac. No discrete adnexal mass. Suggest follow-up pelvic ultrasound on a short-term basis when clinically feasible. 6. Nonspecific chronic diffuse bladder wall thickening. Moderate bladder distention. No hydronephrosis. 7. Right middle lobe 2 mm solid pulmonary nodule, not previously imaged. No follow-up needed if patient is low-risk. Non-contrast chest CT can be considered in 12 months if patient is high-risk. This recommendation follows the consensus statement: Guidelines for Management of Incidental Pulmonary Nodules Detected on CT Images:From the Fleischner Society 2017; published online before  print (10.1148/[phone]). 8.  Aortic Atherosclerosis (ICD10-I70.0).  Assessment & Plan:  Monique Nguyen is a 48  y.o. female with chronic pancreatitis from alcohol abuse, multiple small pseudocysts with some mild compression of the EGJ but no gastric outlet obstruction.  Monique Nguyen has failure to thrive with severe protein malnutrition and symptoms of some dysphagia and odynophagia described with reported sticking of food in the lower esophagus/ sternum. Monique Nguyen has been tolerating NJ feeds and I have been asked to place a PEG tube.  Technically Monique Nguyen has not tolerated PEG tube feeds and there is a possibility that Monique Nguyen will not tolerate this feed. I do not think Monique Nguyen is a a good candidate for a primary J tube, but I have discussed the case with Dr. Miles CostainShick, IR, and IR is able to convert a 20 french PEG to a GJ after about 1 week of the PEG being in place.    -Discussed with the patient the fact that Monique Nguyen has been receiving NJ feeds and that Monique Nguyen might not tolerate the PEG feeds and need to be converted over. I discussed that there is no physiologic or anastomotic reason for her not to tolerate PEG feeds and that I am hopeful that Monique Nguyen does.  We discussed the risk and benefits of the procedure including but not limited to bleeding, infection, irritation of the skin due to acid, need to stay in for 6-8 weeks and if it gets removed having to have emergency surgery, and need to go to the ED if it is ever dislodged.  We discussed that if Monique Nguyen need to be converted to the NJ that this would be about 1 week out from the PEG.  Discussed that people have PEGs at home all the time and learn to care for them and deliver feeds and Monique Nguyen will be able to do this.   All questions were answered to the satisfaction of the patient.  After careful consideration, Monique Nguyen has decided to proceed.   Updated Dr. Sherryll BurgerShah.  -NPO at midnight -Consent orders written -Ancef OCTOR   Monique Nguyen 10/21/2018, 12:49 PM

## 2018-10-21 NOTE — Progress Notes (Signed)
PROGRESS NOTE    Monique Nguyen  ZOX:[id number] DOB: 06/02/1971 DOA: 10/14/2018 PCP: Patient, No Pcp Per   Brief Narrative:  Per HPI: 48 y.o.femalewith past medical history significant for tobacco abuse, depression and/anxiety, gastroesophageal reflux disease, history of alcoholic pancreatitis and psoriasis; who presented to the hospital secondary to increased abdominal pain, nausea, vomiting and inability to eat. Patient reports that for the last month she has no being able to eat essentially anything (including having trouble even with liquids). She has no significant amount of weight and expressed increased abdominal pain. Pain is localized in her epigastric area/mid section, radiated to the left side of back, 10 out of 10 in intensity, no alleviating factors, associated with anorexia, nausea and intermittent episodes of vomiting (there has not been any blood seen in the vomit content). Patient denies any fever, chills, chest pain, shortness of breath, hematuria, dysuria, melena, hematochezia, focal weakness or any other complaints.  There has not been any sick contacts or recent travels.  Patient has been admitted with acute on chronic pancreatitis in the setting of some failure to thrive. She has undergone EGD on 5/5 which was unremarkable.She has undergone placement of NJ tube on 5/6.She is now working on advancement to her goal rate today and is having further electrolyte supplementation, but is having slow improvements.  Her nasojejunal tube was dislodged on 5/9 and therefore was removed.  General surgery has now been consulted for evaluation of PEG tube placement.   Assessment & Plan:   Principal Problem:   Acute on chronic pancreatitis (HCC) Active Problems:   Depression   Hypokalemia   Adult failure to thrive   GERD (gastroesophageal reflux disease)   Dehydration   Chronic pancreatitis (HCC)   Dysphagia   Protein-calorie malnutrition, severe   1-acute on  chronic pancreatitis (HCC)-improved -Continue as needed pain medication, antiemetics and supportive care -CT scan done5/5demonstrated development of pseudocyst creating pressure in her esophagus and most likely being responsible for chronic abdominal pain and difficulty swallowing. -Endoscopy 5/5 which was unremarkable -NJ tube placed on 5/6 and patient is tolerating slow rate of tube feeds which are being advanced by nutrition today.Appreciate further GI recommendations today. -Patient will require tertiary level care for endoscopic ultrasound and ERCP with removal of stones in the near future. -NJ tube dislodged on 5/9 and subsequently removed by GI.  General surgery consulted for placement of PEG tube hopefully by 5/11.  2-Depression/anxiety -Will need outpatient follow-up with psychiatry service -No suicidal ideation or hallucination -Patient reports no using any antidepressant prior to admission. -Continue anxiolytics as needed  3-Hypokalemia/hypomagnesemia -Now resolved and will monitor in a.m.   4-Adult failure to thrive/severe protein calorie malnutrition -Will follow recommendations by dietitian regarding feeding supplements. -Most likely associated with chronic pancreatitis -Continue on Creon -Body mass index is 15.29 kg/m. -Anticipating PEG tube placement due to NJ tube dislodgment.  5-GERD (gastroesophageal reflux disease) -Continue PPI  6-Dehydration -Maintain on IV fluid for now until PEG tube placement.  7-Dysphagia -Advance to full liquids with EGD performed 5/5 with no gastric outlet obstruction noted -Monitor calorie count  8-tobacco abuse -Cessation counseling has been provided -Continue nicotine patch.  9-thrush -Will treat with nystatin  10-hypocalcemia-improving -Maintain on calcium carbonate now twice daily. -Monitor repeat labs  DVT prophylaxis:Heparin Code Status:Full code Family Communication:No family at bedside.  Disposition Plan:Appreciate general surgery consultation for PEG tube placement.  Will need to follow-up at [facility] for further GI care in the near future.  Discharge to home with  home tube feeds once tolerated and stable.  Consultants:  Gastroenterology  Procedures:  See below for x-ray reports.  Antimicrobials:    Anti-infectives(From admission, onward)   None    Subjective: Patient seen and evaluated today with no new acute complaints or concerns. No acute concerns or events noted overnight.  Objective: Vitals:   10/20/18 0530 10/20/18 1500 10/20/18 2148 10/21/18 0546  BP: 91/66 101/73 102/70 115/76  Pulse: (!) 112 97 (!) 124 (!) 116  Resp:   16 16  Temp: 97.8 F (36.6 C) 98 F (36.7 C) 98.5 F (36.9 C) 98.7 F (37.1 C)  TempSrc: Oral Oral Oral Oral  SpO2: 100% 100% 100% 100%  Weight: 43.4 kg   44.6 kg  Height:        Intake/Output Summary (Last 24 hours) at 10/21/2018 1056 Last data filed at 10/21/2018 0700 Gross per 24 hour  Intake 1380 ml  Output 2700 ml  Net -1320 ml   Filed Weights   10/19/18 0915 10/20/18 0530 10/21/18 0546  Weight: 47.5 kg 43.4 kg 44.6 kg    Examination:  General exam: Appears calm and comfortable  Respiratory system: Clear to auscultation. Respiratory effort normal.  Currently on nasal cannula Cardiovascular system: S1 & S2 heard, RRR. No JVD, murmurs, rubs, gallops or clicks. No pedal edema. Gastrointestinal system: Abdomen is nondistended, soft and nontender. No organomegaly or masses felt. Normal bowel sounds heard. Central nervous system: Alert and oriented. No focal neurological deficits. Extremities: Symmetric 5 x 5 power. Skin: No rashes, lesions or ulcers Psychiatry: Judgement and insight appear normal. Mood & affect appropriate.     Data Reviewed: I have personally reviewed following labs and imaging studies  CBC: Recent Labs  Lab 10/15/18 0506 10/17/18 0454 10/18/18 0444  WBC 4.3 3.5* 3.6*  HGB 9.1* 7.9*  7.8*  HCT 26.5* 23.8* 23.7*  MCV 94.3 97.1 97.9  PLT 287 206 167   Basic Metabolic Panel: Recent Labs  Lab 10/17/18 0454 10/18/18 0444 10/19/18 0529 10/20/18 0553 10/21/18 0559  NA 139 140 140 136 137  K 3.0* 3.0* 4.5 4.5 4.2  CL 106 104 108 102 101  CO2 GLUCOSE 136* 136* 119* 126* 121*  BUN CREATININE <0.30* <0.30* <0.30* <0.30* <0.30*  CALCIUM 6.3* 6.1* 6.7* 7.7* 8.2*  MG  --  0.9* 1.6* 1.9 1.8   GFR: CrCl cannot be calculated (This lab value cannot be used to calculate CrCl because it is not a number: <0.30). Liver Function Tests: Recent Labs  Lab 10/15/18 0506 10/18/18 0444  AST 41 21  ALT 27 17  ALKPHOS 101 64  BILITOT 0.4 0.2*  PROT 5.3* 4.3*  ALBUMIN 2.4* 1.9*   No results for input(s): LIPASE, AMYLASE in the last 168 hours. No results for input(s): AMMONIA in the last 168 hours. Coagulation Profile: No results for input(s): INR, PROTIME in the last 168 hours. Cardiac Enzymes: No results for input(s): CKTOTAL, CKMB, CKMBINDEX, TROPONINI in the last 168 hours. BNP (last 3 results) No results for input(s): PROBNP in the last 8760 hours. HbA1C: No results for input(s): HGBA1C in the last 72 hours. CBG: No results for input(s): GLUCAP in the last 168 hours. Lipid Profile: No results for input(s): CHOL, HDL, LDLCALC, TRIG, CHOLHDL, LDLDIRECT in the last 72 hours. Thyroid Function Tests: No results for input(s): TSH, T4TOTAL, FREET4, T3FREE, THYROIDAB in the last 72 hours. Anemia Panel: No results for input(s): VITAMINB12, FOLATE, FERRITIN,  TIBC, IRON, RETICCTPCT in the last 72 hours. Sepsis Labs: No results for input(s): PROCALCITON, LATICACIDVEN in the last 168 hours.  Recent Results (from the past 240 hour(s))  SARS Coronavirus 2 (CEPHEID - Performed in [facility] hospital lab), Hosp Order     Status: None   Collection Time: 10/14/18  8:11 AM  Result Value Ref Range Status   SARS Coronavirus 2 NEGATIVE NEGATIVE Final     Comment: (NOTE) If result is NEGATIVE SARS-CoV-2 target nucleic acids are NOT DETECTED. The SARS-CoV-2 RNA is generally detectable in upper and lower  respiratory specimens during the acute phase of infection. The lowest  concentration of SARS-CoV-2 viral copies this assay can detect is 250  copies / mL. A negative result does not preclude SARS-CoV-2 infection  and should not be used as the sole basis for treatment or other  patient management decisions.  A negative result may occur with  improper specimen collection / handling, submission of specimen other  than nasopharyngeal swab, presence of viral mutation(s) within the  areas targeted by this assay, and inadequate number of viral copies  (<250 copies / mL). A negative result must be combined with clinical  observations, patient history, and epidemiological information. If result is POSITIVE SARS-CoV-2 target nucleic acids are DETECTED. The SARS-CoV-2 RNA is generally detectable in upper and lower  respiratory specimens dur ing the acute phase of infection.  Positive  results are indicative of active infection with SARS-CoV-2.  Clinical  correlation with patient history and other diagnostic information is  necessary to determine patient infection status.  Positive results do  not rule out bacterial infection or co-infection with other viruses. If result is PRESUMPTIVE POSTIVE SARS-CoV-2 nucleic acids MAY BE PRESENT.   A presumptive positive result was obtained on the submitted specimen  and confirmed on repeat testing.  While 2019 novel coronavirus  (SARS-CoV-2) nucleic acids may be present in the submitted sample  additional confirmatory testing may be necessary for epidemiological  and / or clinical management purposes  to differentiate between  SARS-CoV-2 and other Sarbecovirus currently known to infect humans.  If clinically indicated additional testing with an alternate test  methodology [phone] is advised. The  SARS-CoV-2 RNA is generally  detectable in upper and lower respiratory sp ecimens during the acute  phase of infection. The expected result is Negative. Fact Sheet for Patients:  BoilerBrush.com.cyhttps://www.fda.gov/media/136312/download Fact Sheet for Healthcare Providers: https://pope.com/https://www.fda.gov/media/136313/download This test is not yet approved or cleared by the [place] FDA and has been authorized for detection and/or diagnosis of SARS-CoV-2 by FDA under an Emergency Use Authorization (EUA).  This EUA will remain in effect (meaning this test can be used) for the duration of the COVID-19 declaration under Section 564(b)(1) of the Act, 21 U.S.C. section 360bbb-3(b)(1), unless the authorization is terminated or revoked sooner. Performed at [facility], [address] [place], [place] [address]   Culture, Urine     Status: None   Collection Time: 10/14/18  5:23 PM  Result Value Ref Range Status   Specimen Description   Final    URINE, CLEAN CATCH Performed at [facility], [address] [place], [place] [address]    Special Requests   Final    NONE Performed at [facility], [address] [place], [place] [address]    Culture   Final    NO GROWTH Performed at [facility], [address] [place], [place] [address]    Report Status 10/16/2018 FINAL  Final  Radiology Studies: No results found.      Scheduled Meds: . butamben-tetracaine-benzocaine  1 spray Topical Once  . calcium carbonate (dosed in mg elemental calcium)  1,000 mg of elemental calcium Oral BID WC  . feeding supplement  1 Container Oral TID BM  . feeding supplement (PRO-STAT SUGAR FREE 64)  30 mL Oral BID  . folic acid  1 mg Intravenous Daily  . free water  200 mL Per Tube Q8H  . heparin injection (subcutaneous)  5,000 Units Subcutaneous Q8H  . lipase/protease/amylase  24,000 Units Oral With snacks  . lipase/protease/amylase  36,000 Units Oral TID WC  . multivitamin  15 mL Oral Daily  . nicotine   21 mg Transdermal Daily  . nystatin  5 mL Oral QID  . pantoprazole  40 mg Oral Daily  . thiamine injection  100 mg Intravenous Daily   Continuous Infusions: . dextrose 5 % and 0.45% NaCl 75 mL/hr at 10/21/18 0448  . feeding supplement (OSMOLITE 1.5 CAL) 30 mL/hr at 10/19/18 1006     LOS: 7 days    Time spent: 30 minutes    Nuh Lipton Hoover Brunette, DO [facility] Pager [phone]  If 7PM-7AM, please contact night-coverage [email or web address] Password [email or web address] 10/21/2018, 10:56 AM

## 2018-10-21 NOTE — Consult Note (Signed)
[facility] Consult  Reason for Consult: Malnutrition, po intolerance/ dysphagia related to pancreatic pseudocyst compression of EGJ  Referring Physician:  Dr. Sherryll Burger   Chief Complaint    Failure To Thrive      Monique Nguyen is a 48 y.o. female.  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  She has been having worsening abdominal pain, failure to thrive, 52 lb weight loss in the last 3 years, and dysphagia, odynophagia symptoms with nausea and vomiting. GI performed an EGD that demonstrated no abnormality internally of the esophagus, stomach and an open pylorus. She has had a CT scan that demonstrated several small scattered pancreatic pseudocysts.  She feels like the food gets "stuck" in her lower esophagus and she spits up foamy liquid and food.  She otherwise has a history of depression, anxiety, grief, and is tearful during our conversation. She had a NJ placed by radiology and has tolerated the NJ feeds but this was dislodged. She says it was only secured with a tape, and that she does not remember it being pulled but she did have a sneezing episode that could have dislodged it.    Past Medical History:  Diagnosis Date  . Alcohol abuse   . Anxiety   . Bacterial vaginosis   . Chronic abdominal pain   . Chronic pain   . Depression   . GERD (gastroesophageal reflux disease)   . IBS (irritable bowel syndrome)   . Pancreatitis    ETOH  . Panic attacks   . Psoriasis     Past Surgical History:  Procedure Laterality Date  . BLADDER SURGERY     X 3  . ESOPHAGOGASTRODUODENOSCOPY (EGD) WITH PROPOFOL N/A 10/16/2018   Procedure: ESOPHAGOGASTRODUODENOSCOPY (EGD) WITH PROPOFOL;  Surgeon: Corbin Ade, MD;  Location: [facility];  Service: Endoscopy;  Laterality: N/A;  . EUS  N/A 10/11/2012   Dr. Christella Hartigan: 3.7 cm cystic lesion in body of pancreas with large amount of debris, s/p FNA with reddish, milky fluid, main pancreatic duct normal,   . EUS  12/2015   mild to moderate chronic pancreatitis, pancreatic duct with dilation and intraductal stones, measuring up to 6 mm in diameter, no abnormality in main bile duct, abnormal lymph nodes in perigastric region, largest measuring 12 mm in diameter, s/p fine needle biopsy. Pathology with multiple fragments of lymphoid tissue with granulomatous inflammation, negative GMS and AFB stains,    Family History  Problem Relation Age of Onset  . Depression Mother        living  . Pancreatic cancer Father        PATIENT STATES PROSTATE CANCER. UNCLEAR IF ACTUAL PANCREATIC CANCER  . Colon cancer Neg Hx   . Colon polyps Neg Hx     Social History   Tobacco Use  . Smoking status: Current Some Day Smoker    Packs/day: 1.00    Years: 8.00    Pack years: 8.00    Types: Cigarettes  . Smokeless tobacco: Never Used  . Tobacco comment: several cigarretes a day  Substance Use Topics  . Alcohol use: Yes    Alcohol/week: 3.0 standard drinks    Types: 3 Cans of beer per week    Comment: last drink 06/05/18, hx of ETOH abuse  . Drug  use: Not Currently    Types: Marijuana    Comment: last time used 1` month ago. DENIED DRUG USE  on 10/15/2018    Medications:  I have reviewed the patient's current medications. Prior to Admission:  No medications prior to admission.   Scheduled: . butamben-tetracaine-benzocaine  1 spray Topical Once  . calcium carbonate (dosed in mg elemental calcium)  1,000 mg of elemental calcium Oral BID WC  . feeding supplement  1 Container Oral TID BM  . feeding supplement (PRO-STAT SUGAR FREE 64)  30 mL Oral BID  . folic acid  1 mg Intravenous Daily  . free water  200 mL Per Tube Q8H  . heparin injection (subcutaneous)  5,000 Units Subcutaneous Q8H  . lipase/protease/amylase  24,000 Units Oral With snacks   . lipase/protease/amylase  36,000 Units Oral TID WC  . multivitamin  15 mL Oral Daily  . nicotine  21 mg Transdermal Daily  . nystatin  5 mL Oral QID  . pantoprazole  40 mg Oral Daily  . thiamine injection  100 mg Intravenous Daily   Continuous: . dextrose 5 % and 0.45% NaCl 75 mL/hr at 10/21/18 0448  . feeding supplement (OSMOLITE 1.5 CAL) 30 mL/hr at 10/19/18 1006   MBE:[id number], LORazepam, morphine injection, prochlorperazine  Allergies  Allergen Reactions  . Doxycycline Other (See Comments)    headaches  . Sulfa Antibiotics Nausea And Vomiting and Other (See Comments)    Blurred vision and headaches     ROS:  A comprehensive review of systems was negative except for: Constitutional: positive for weight loss and weakness Gastrointestinal: positive for abdominal pain, dysphagia, nausea, odynophagia, reflux symptoms and vomiting Alcohol abuse, drank fireball last month   Blood pressure 115/76, pulse (!) 116, temperature 98.7 F (37.1 C), temperature source Oral, resp. rate 16, height 5\' 1"  (1.549 m), weight 44.6 kg, last menstrual period 04/12/2013, SpO2 100 %. Physical Exam Vitals signs reviewed.  Constitutional:      General: She is not in acute distress.    Appearance: She is cachectic.  HENT:     Head: Normocephalic.     Nose: Nose normal.     Mouth/Throat:     Mouth: Mucous membranes are moist.  Eyes:     Pupils: Pupils are equal, round, and reactive to light.  Neck:     Musculoskeletal: Normal range of motion.  Cardiovascular:     Rate and Rhythm: Normal rate and regular rhythm.  Pulmonary:     Effort: Pulmonary effort is normal.  Abdominal:     General: There is distension.     Palpations: Abdomen is soft.     Tenderness: There is abdominal tenderness. There is no guarding or rebound.     Comments: Mildly distended, tender in the epigastric region  Musculoskeletal:        General: No swelling.  Skin:    General: Skin is warm and dry.  Neurological:      General: No focal deficit present.     Mental Status: She is alert and oriented to person, place, and time.  Psychiatric:        Behavior: Behavior normal.        Thought Content: Thought content normal.        Judgment: Judgment normal.     Comments: Tearful      Results: Results for orders placed or performed during the hospital encounter of 10/14/18 (from the past 48 hour(s))  Basic metabolic panel     Status:  Abnormal   Collection Time: 10/20/18  5:53 AM  Result Value Ref Range   Sodium 136 135 - 145 mmol/L   Potassium 4.5 3.5 - 5.1 mmol/L   Chloride 102 98 - 111 mmol/L   CO2 26 22 - 32 mmol/L   Glucose, Bld 126 (H) 70 - 99 mg/dL   BUN 11 6 - 20 mg/dL   Creatinine, Ser <1.61 (L) 0.44 - 1.00 mg/dL   Calcium 7.7 (L) 8.9 - 10.3 mg/dL   GFR calc non Af Amer NOT CALCULATED >60 mL/min   GFR calc Af Amer NOT CALCULATED >60 mL/min   Anion gap 8 5 - 15    Comment: Performed at [facility], [address] [place], [place] [address]  Magnesium     Status: None   Collection Time: 10/20/18  5:53 AM  Result Value Ref Range   Magnesium 1.9 1.7 - 2.4 mg/dL    Comment: Performed at [facility], [address], [place], [place] [address]  Basic metabolic panel     Status: Abnormal   Collection Time: 10/21/18  5:59 AM  Result Value Ref Range   Sodium 137 135 - 145 mmol/L   Potassium 4.2 3.5 - 5.1 mmol/L   Chloride 101 98 - 111 mmol/L   CO2 27 22 - 32 mmol/L   Glucose, Bld 121 (H) 70 - 99 mg/dL   BUN 13 6 - 20 mg/dL   Creatinine, Ser <1.19 (L) 0.44 - 1.00 mg/dL   Calcium 8.2 (L) 8.9 - 10.3 mg/dL   GFR calc non Af Amer NOT CALCULATED >60 mL/min   GFR calc Af Amer NOT CALCULATED >60 mL/min   Anion gap 9 5 - 15    Comment: Performed at [facility], [address], [place], [place] [address]  Magnesium     Status: None   Collection Time: 10/21/18  5:59 AM  Result Value Ref Range   Magnesium 1.8 1.7 - 2.4 mg/dL    Comment: Performed at [facility], [address],  [place], [place] [address]    CT scan Personally Reviewed small fluid collections around the stomach and EGJ with some mild compression at the EGJ, no gastric outlet obstruction   IMPRESSION: 1. 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 Redemonstrated findings of chronic pancreatitis in the distal body and tail of the pancreas. 2. New irregular thick walled fluid collections encasing the esophagogastric junction anteriorly and posteriorly with associated wall thickening throughout the esophagogastric junction. Similar new irregular thick walled fluid collection along the posterior margin of the antropyloric stomach with some mass effect on the distal stomach, which is not significantly distended. These nonspecific findings also presumably represent pancreatic pseudocysts with reactive wall thickening at the esophagogastric junction and distal stomach. Infected collections cannot be excluded. 3. Close CT or MRI abdomen follow-up imaging advised. 4. No biliary ductal dilatation.  No radiopaque cholelithiasis. 5. Nonspecific small volume hemoperitoneum in the pelvic cul-de-sac. No discrete adnexal mass. Suggest follow-up pelvic ultrasound on a short-term basis when clinically feasible. 6. Nonspecific chronic diffuse bladder wall thickening. Moderate bladder distention. No hydronephrosis. 7. Right middle lobe 2 mm solid pulmonary nodule, not previously imaged. No follow-up needed if patient is low-risk. Non-contrast chest CT can be considered in 12 months if patient is high-risk. This recommendation follows the consensus statement: Guidelines for Management of Incidental Pulmonary Nodules Detected on CT Images:From the Fleischner Society 2017; published online before  print (10.1148/[phone]). 8.  Aortic Atherosclerosis (ICD10-I70.0).  Assessment & Plan:  Monique BootsRobin J Fargnoli is a 48  y.o. female with chronic pancreatitis from alcohol abuse, multiple small pseudocysts with some mild compression of the EGJ but no gastric outlet obstruction.  She has failure to thrive with severe protein malnutrition and symptoms of some dysphagia and odynophagia described with reported sticking of food in the lower esophagus/ sternum. She has been tolerating NJ feeds and I have been asked to place a PEG tube.  Technically she has not tolerated PEG tube feeds and there is a possibility that she will not tolerate this feed. I do not think she is a a good candidate for a primary J tube, but I have discussed the case with Dr. Miles CostainShick, IR, and IR is able to convert a 20 french PEG to a GJ after about 1 week of the PEG being in place.    -Discussed with the patient the fact that she has been receiving NJ feeds and that she might not tolerate the PEG feeds and need to be converted over. I discussed that there is no physiologic or anastomotic reason for her not to tolerate PEG feeds and that I am hopeful that she does.  We discussed the risk and benefits of the procedure including but not limited to bleeding, infection, irritation of the skin due to acid, need to stay in for 6-8 weeks and if it gets removed having to have emergency surgery, and need to go to the ED if it is ever dislodged.  We discussed that if she need to be converted to the NJ that this would be about 1 week out from the PEG.  Discussed that people have PEGs at home all the time and learn to care for them and deliver feeds and she will be able to do this.   All questions were answered to the satisfaction of the patient.  After careful consideration, Monique Nguyen has decided to proceed.   Updated Dr. Sherryll BurgerShah.  -NPO at midnight -Consent orders written -Ancef OCTOR   Lucretia RoersLindsay C Abrey Bradway 10/21/2018, 12:49 PM

## 2018-10-22 ENCOUNTER — Encounter (HOSPITAL_COMMUNITY): Payer: Self-pay | Admitting: Anesthesiology

## 2018-10-22 ENCOUNTER — Inpatient Hospital Stay (HOSPITAL_COMMUNITY): Payer: Medicaid Other | Admitting: Anesthesiology

## 2018-10-22 ENCOUNTER — Encounter (HOSPITAL_COMMUNITY)
Admission: EM | Disposition: A | Discharge status: Home Health | Payer: Self-pay | Source: Home / Self Care | Attending: Internal Medicine

## 2018-10-22 HISTORY — PX: PEG PLACEMENT: SHX5437

## 2018-10-22 HISTORY — PX: ESOPHAGOGASTRODUODENOSCOPY (EGD) WITH PROPOFOL: SHX5813

## 2018-10-22 LAB — BASIC METABOLIC PANEL
Anion gap: 9 (ref 5–15)
BUN: 18 mg/dL (ref 6–20)
CO2: 25 mmol/L (ref 22–32)
Calcium: 8 mg/dL — ABNORMAL LOW (ref 8.9–10.3)
Chloride: 102 mmol/L (ref 98–111)
Creatinine, Ser: 0.31 mg/dL — ABNORMAL LOW (ref 0.44–1.00)
GFR calc Af Amer: >60 mL/min (ref >60)
GFR calc non Af Amer: >60 mL/min (ref >60)
Glucose, Bld: 125 mg/dL — ABNORMAL HIGH (ref 70–99)
Potassium: 3.4 mmol/L — ABNORMAL LOW (ref 3.5–5.1)
Sodium: 136 mmol/L (ref 135–145)

## 2018-10-22 LAB — MAGNESIUM: Magnesium: 1.6 mg/dL — ABNORMAL LOW (ref 1.7–2.4)

## 2018-10-22 SURGERY — INSERTION, PEG TUBE
Anesthesia: Monitor Anesthesia Care

## 2018-10-22 MED ORDER — MAGNESIUM SULFATE 2 GM/50ML IV SOLN
2.0000 g | Freq: Once | INTRAVENOUS | Status: AC
  Administered 2018-10-22: 2 g via INTRAVENOUS
  Filled 2018-10-22: qty 50

## 2018-10-22 MED ORDER — PROPOFOL 10 MG/ML IV BOLUS
INTRAVENOUS | Status: AC
  Filled 2018-10-22: qty 40

## 2018-10-22 MED ORDER — LIDOCAINE HCL (PF) 1 % IJ SOLN
INTRAMUSCULAR | Status: DC | PRN
  Administered 2018-10-22: 4 mL

## 2018-10-22 MED ORDER — MIDAZOLAM HCL 2 MG/2ML IJ SOLN
INTRAMUSCULAR | Status: AC
  Filled 2018-10-22: qty 2

## 2018-10-22 MED ORDER — STERILE WATER FOR IRRIGATION IR SOLN
Status: DC | PRN
  Administered 2018-10-22: 1000 mL

## 2018-10-22 MED ORDER — PROPOFOL 500 MG/50ML IV EMUL
INTRAVENOUS | Status: DC | PRN
  Administered 2018-10-22: 150 ug/kg/min via INTRAVENOUS

## 2018-10-22 MED ORDER — KETAMINE HCL 50 MG/5ML IJ SOSY
PREFILLED_SYRINGE | INTRAMUSCULAR | Status: AC
  Filled 2018-10-22: qty 5

## 2018-10-22 MED ORDER — MIDAZOLAM HCL 5 MG/5ML IJ SOLN
INTRAMUSCULAR | Status: DC | PRN
  Administered 2018-10-22: 2 mg via INTRAVENOUS

## 2018-10-22 MED ORDER — OXYCODONE HCL 5 MG/5ML PO SOLN
5.0000 mg | ORAL | Status: DC | PRN
  Administered 2018-10-22 – 2018-10-23 (×4): 5 mg via ORAL
  Filled 2018-10-22 (×4): qty 5

## 2018-10-22 MED ORDER — KCL IN DEXTROSE-NACL 40-5-0.45 MEQ/L-%-% IV SOLN
INTRAVENOUS | Status: DC
  Administered 2018-10-22 – 2018-10-23 (×2): via INTRAVENOUS

## 2018-10-22 MED ORDER — OSMOLITE 1.5 CAL PO LIQD
1000.0000 mL | ORAL | Status: DC
  Administered 2018-10-22: 1000 mL
  Filled 2018-10-22 (×2): qty 1000

## 2018-10-22 MED ORDER — FREE WATER
200.0000 mL | Freq: Three times a day (TID) | Status: DC
  Administered 2018-10-22 – 2018-10-23 (×3): 200 mL

## 2018-10-22 MED ORDER — LACTATED RINGERS IV SOLN
INTRAVENOUS | Status: DC | PRN
  Administered 2018-10-22: 09:00:00 via INTRAVENOUS

## 2018-10-22 MED ORDER — KETAMINE HCL 10 MG/ML IJ SOLN
INTRAMUSCULAR | Status: DC | PRN
  Administered 2018-10-22: 10 mg via INTRAVENOUS
  Administered 2018-10-22 (×2): 5 mg via INTRAVENOUS

## 2018-10-22 MED ORDER — KETOROLAC TROMETHAMINE 30 MG/ML IJ SOLN
15.0000 mg | Freq: Once | INTRAMUSCULAR | Status: AC
  Administered 2018-10-22: 15 mg via INTRAVENOUS

## 2018-10-22 MED ORDER — LACTATED RINGERS IV SOLN: INTRAVENOUS | Status: DC

## 2018-10-22 MED ORDER — KETOROLAC TROMETHAMINE 30 MG/ML IJ SOLN
INTRAMUSCULAR | Status: AC
  Filled 2018-10-22: qty 1

## 2018-10-22 SURGICAL SUPPLY — 12 items
BINDER ABD UNIV 9 30-45 (GAUZE/BANDAGES/DRESSINGS) IMPLANT
BINDER ABDOMINAL  9 SM 30-45 (SOFTGOODS) ×2
BINDER ABDOMINAL 9 (GAUZE/BANDAGES/DRESSINGS) ×3 IMPLANT
BINDER ABDOMINAL 9 SM 30-45 (SOFTGOODS) IMPLANT
GLOVE BIOGEL PI IND STRL 6.5 (GLOVE) ×1 IMPLANT
GLOVE BIOGEL PI INDICATOR 6.5 (GLOVE) ×2
GLOVE SURG SS PI 7.5 STRL IVOR (GLOVE) ×3 IMPLANT
KIT PEG SAFETY 20FR (KITS) ×3 IMPLANT
KIT TURNOVER KIT A (KITS) ×3 IMPLANT
MANIFOLD NEPTUNE II (INSTRUMENTS) ×3 IMPLANT
PAD ABD 5X9 TENDERSORB (GAUZE/BANDAGES/DRESSINGS) ×3 IMPLANT
PAD ABD 8X10 STRL (GAUZE/BANDAGES/DRESSINGS) ×2 IMPLANT

## 2018-10-22 NOTE — Transfer of Care (Signed)
Immediate Anesthesia Transfer of Care Note  Patient: Monique Nguyen  Procedure(s) Performed: PERCUTANEOUS ENDOSCOPIC GASTROSTOMY (PEG) PLACEMENT with endoscopy (N/A ) ESOPHAGOGASTRODUODENOSCOPY (EGD) WITH PROPOFOL (N/A )  Patient Location: PACU  Anesthesia Type:MAC  Level of Consciousness: awake and alert   Airway & Oxygen Therapy: Patient Spontanous Breathing  Post-op Assessment: Report given to RN  Post vital signs: Reviewed and stable  Last Vitals:  Vitals Value Taken Time  BP 87/74 10/22/2018 10:15 AM  Temp    Pulse 119 10/22/2018 10:18 AM  Resp 15 10/22/2018 10:18 AM  SpO2 94 % 10/22/2018 10:18 AM  Vitals shown include unvalidated device data.  Last Pain:  Vitals:   10/22/18 0746  TempSrc:   PainSc: 10-Worst pain ever      Patients Stated Pain Goal: 2 (21/62/44 6950)  Complications: No apparent anesthesia complications

## 2018-10-22 NOTE — Interval H&P Note (Signed)
History and Physical Interval Note:  10/22/2018 9:10 AM  Monique Nguyen  has presented today for surgery, with the diagnosis of failure to thrive, dysphagia.  The various methods of treatment have been discussed with the patient and family. After consideration of risks, benefits and other options for treatment, the patient has consented to  Procedure(s): PERCUTANEOUS ENDOSCOPIC GASTROSTOMY (PEG) PLACEMENT with endoscopy (N/A) ESOPHAGOGASTRODUODENOSCOPY (EGD) WITH PROPOFOL (N/A) as a surgical intervention.  The patient's history has been reviewed, patient examined, no change in status, stable for surgery.  I have reviewed the patient's chart and labs.  Questions were answered to the patient's satisfaction.    No changes. Discussed that patient will need a PCP to continue home health orders for the longterm for feeds. Having some pain at the top of her stomach.  Discussed that she should tolerate the PEG feeds but if she does not she will need the GJ added by IR next week.   Lucretia Roers

## 2018-10-22 NOTE — Progress Notes (Signed)
PROGRESS NOTE    Monique Nguyen  ZOX:[id number] DOB: 03/03/1971 DOA: 10/14/2018 PCP: Patient, No Pcp Per   Brief Narrative: Per HPI: 48 y.o.femalewith past medical history significant for tobacco abuse, depression and/anxiety, gastroesophageal reflux disease, history of alcoholic pancreatitis and psoriasis; who presented to the hospital secondary to increased abdominal pain, nausea, vomiting and inability to eat. Patient reports that for the last month she has no being able to eat essentially anything (including having trouble even with liquids). She has no significant amount of weight and expressed increased abdominal pain. Pain is localized in her epigastric area/mid section, radiated to the left side of back, 10 out of 10 in intensity, no alleviating factors, associated with anorexia, nausea and intermittent episodes of vomiting (there has not been any blood seen in the vomit content). Patient denies any fever, chills, chest pain, shortness of breath, hematuria, dysuria, melena, hematochezia, focal weakness or any other complaints.  There has not been any sick contacts or recent travels.  Patient has been admitted with acute on chronic pancreatitis in the setting of some failure to thrive. She has undergone EGD on 5/5 which was unremarkable.She has undergone placement of NJ tube on 5/6.She is now working on advancement to her goal rate today and is having further electrolyte supplementation, but is having slow improvements.  Her nasojejunal tube was dislodged on 5/9 and therefore was removed.  General surgery was consulted for PEG tube placement on 5/10 and she has undergone placement on 5/11 with no complications noted.  Assessment & Plan:   Principal Problem:   Acute on chronic pancreatitis (HCC) Active Problems:   Depression   Hypokalemia   Adult failure to thrive   GERD (gastroesophageal reflux disease)   Dehydration   Chronic pancreatitis (HCC)   Dysphagia  Protein-calorie malnutrition, severe   Odynophagia  1-acute on chronic pancreatitis (HCC)-improved -Continue as needed pain medication, antiemetics and supportive care -CT scan done5/5demonstrated development of pseudocyst creating pressure in her esophagus and most likely being responsible for chronic abdominal pain and difficulty swallowing. -Endoscopy 5/5 which was unremarkable -NJ tube placed on 5/6 and patient is tolerating slow rate of tube feeds which are being advanced by nutrition today.Appreciate further GI recommendations today. -Patient will require tertiary level care for endoscopic ultrasound and ERCP with removal of stones in the near future. -NJ tube dislodged on 5/9 and subsequently removed by GI.  General surgery consulted with PEG tube placed on 5/11.  Anticipate restarting feeds this afternoon.  2-Depression/anxiety -Will need outpatient follow-up with psychiatry service -No suicidal ideation or hallucination -Patient reports no using any antidepressant prior to admission. -Continue anxiolytics as needed  3-Hypokalemia/hypomagnesemia -Replete and recheck in a.m.   4-Adult failure to thrive/severe protein calorie malnutrition -Will follow recommendations by dietitian regarding feeding supplements. -Most likely associated with chronic pancreatitis -Continue on Creon -Body mass index is 15.29 kg/m. -Anticipating PEG tube placement due to NJ tube dislodgment.  5-GERD (gastroesophageal reflux disease) -Continue PPI  6-Dehydration -Maintain on IV fluid for now until PEG tube placement.  7-Dysphagia -Advance to full liquids with EGD performed 5/5 with no gastric outlet obstruction noted -Monitor calorie count  8-tobacco abuse -Cessation counseling has been provided -Continue nicotine patch.  9-thrush -Will treat with nystatin  10-hypocalcemia-improving -Maintain on calcium carbonate now twice daily. -Monitor repeat labs  DVT  prophylaxis:Heparin Code Status:Full code Family Communication:No family at bedside. Disposition Plan:Appreciate general surgery placement of PEG tube and will initiate previous diet and tube feeds at 1630.  Anticipate  discharge tomorrow with home health infusion services if stable and tolerating feeds.  Consultants:  Gastroenterology  Procedures:  See below for x-ray reports.  Antimicrobials:    Anti-infectives(From admission, onward)   None   Subjective: Patient seen and evaluated today with no new acute complaints or concerns. No acute concerns or events noted overnight.  She is doing well after PEG tube placement and denies any complaints or concerns.  Objective: Vitals:   10/22/18 1045 10/22/18 1100 10/22/18 1115 10/22/18 1145  BP: (!) 104/72  Pulse: (!) 107 (!) 121 (!) 113 (!) 121  Resp: Temp:    99.5 F (37.5 C)  TempSrc:    Oral  SpO2: 100% 99% 97% 97%  Weight:      Height:        Intake/Output Summary (Last 24 hours) at 10/22/2018 1242 Last data filed at 10/22/2018 1139 Gross per 24 hour  Intake 600 ml  Output 1750 ml  Net -1150 ml   Filed Weights   10/20/18 0530 10/21/18 0546 10/22/18 0500  Weight: 43.4 kg 44.6 kg 48.1 kg    Examination:  General exam: Appears calm and comfortable  Respiratory system: Clear to auscultation. Respiratory effort normal. Cardiovascular system: S1 & S2 heard, RRR. No JVD, murmurs, rubs, gallops or clicks. No pedal edema. Gastrointestinal system: Abdomen is nondistended, soft and nontender. No organomegaly or masses felt. Normal bowel sounds heard.  PEG tube with abdominal binder in place, clean dry and intact. Central nervous system: Alert and oriented. No focal neurological deficits. Extremities: Symmetric 5 x 5 power. Skin: No rashes, lesions or ulcers Psychiatry: Judgement and insight appear normal. Mood & affect appropriate.     Data Reviewed: I have personally reviewed  following labs and imaging studies  CBC: Recent Labs  Lab 10/17/18 0454 10/18/18 0444  WBC 3.5* 3.6*  HGB 7.9* 7.8*  HCT 23.8* 23.7*  MCV 97.1 97.9  PLT 206 167   Basic Metabolic Panel: Recent Labs  Lab 10/18/18 0444 10/19/18 0529 10/20/18 0553 10/21/18 0559 10/22/18 0558  NA 140 140 136 137 136  K 3.0* 4.5 4.5 4.2 3.4*  CL 104 108 102 101 102  CO2 GLUCOSE 136* 119* 126* 121* 125*  BUN CREATININE <0.30* <0.30* <0.30* <0.30* 0.31*  CALCIUM 6.1* 6.7* 7.7* 8.2* 8.0*  MG 0.9* 1.6* 1.9 1.8 1.6*   GFR: Estimated Creatinine Clearance: 64.9 mL/min (A) (by C-G formula based on SCr of 0.31 mg/dL (L)). Liver Function Tests: Recent Labs  Lab 10/18/18 0444  AST 21  ALT 17  ALKPHOS 64  BILITOT 0.2*  PROT 4.3*  ALBUMIN 1.9*   No results for input(s): LIPASE, AMYLASE in the last 168 hours. No results for input(s): AMMONIA in the last 168 hours. Coagulation Profile: No results for input(s): INR, PROTIME in the last 168 hours. Cardiac Enzymes: No results for input(s): CKTOTAL, CKMB, CKMBINDEX, TROPONINI in the last 168 hours. BNP (last 3 results) No results for input(s): PROBNP in the last 8760 hours. HbA1C: No results for input(s): HGBA1C in the last 72 hours. CBG: No results for input(s): GLUCAP in the last 168 hours. Lipid Profile: No results for input(s): CHOL, HDL, LDLCALC, TRIG, CHOLHDL, LDLDIRECT in the last 72 hours. Thyroid Function Tests: No results for input(s): TSH, T4TOTAL, FREET4, T3FREE, THYROIDAB in the last 72 hours. Anemia Panel: No results for input(s): VITAMINB12, FOLATE, FERRITIN, TIBC, IRON, RETICCTPCT in  the last 72 hours. Sepsis Labs: No results for input(s): PROCALCITON, LATICACIDVEN in the last 168 hours.  Recent Results (from the past 240 hour(s))  SARS Coronavirus 2 (CEPHEID - Performed in [facility] hospital lab), Hosp Order     Status: None   Collection Time: 10/14/18  8:11 AM  Result Value Ref Range  Status   SARS Coronavirus 2 NEGATIVE NEGATIVE Final    Comment: (NOTE) If result is NEGATIVE SARS-CoV-2 target nucleic acids are NOT DETECTED. The SARS-CoV-2 RNA is generally detectable in upper and lower  respiratory specimens during the acute phase of infection. The lowest  concentration of SARS-CoV-2 viral copies this assay can detect is 250  copies / mL. A negative result does not preclude SARS-CoV-2 infection  and should not be used as the sole basis for treatment or other  patient management decisions.  A negative result may occur with  improper specimen collection / handling, submission of specimen other  than nasopharyngeal swab, presence of viral mutation(s) within the  areas targeted by this assay, and inadequate number of viral copies  (<250 copies / mL). A negative result must be combined with clinical  observations, patient history, and epidemiological information. If result is POSITIVE SARS-CoV-2 target nucleic acids are DETECTED. The SARS-CoV-2 RNA is generally detectable in upper and lower  respiratory specimens dur ing the acute phase of infection.  Positive  results are indicative of active infection with SARS-CoV-2.  Clinical  correlation with patient history and other diagnostic information is  necessary to determine patient infection status.  Positive results do  not rule out bacterial infection or co-infection with other viruses. If result is PRESUMPTIVE POSTIVE SARS-CoV-2 nucleic acids MAY BE PRESENT.   A presumptive positive result was obtained on the submitted specimen  and confirmed on repeat testing.  While 2019 novel coronavirus  (SARS-CoV-2) nucleic acids may be present in the submitted sample  additional confirmatory testing may be necessary for epidemiological  and / or clinical management purposes  to differentiate between  SARS-CoV-2 and other Sarbecovirus currently known to infect humans.  If clinically indicated additional testing with an alternate  test  methodology [phone]) is advised. The SARS-CoV-2 RNA is generally  detectable in upper and lower respiratory sp ecimens during the acute  phase of infection. The expected result is Negative. Fact Sheet for Patients:  BoilerBrush.com.cy Fact Sheet for Healthcare Providers: https://pope.com/ This test is not yet approved or cleared by the [place] FDA and has been authorized for detection and/or diagnosis of SARS-CoV-2 by FDA under an Emergency Use Authorization (EUA).  This EUA will remain in effect (meaning this test can be used) for the duration of the COVID-19 declaration under Section 564(b)(1) of the Act, 21 U.S.C. section 360bbb-3(b)(1), unless the authorization is terminated or revoked sooner. Performed at [facility], [address] [place], [place] [address]   Culture, Urine     Status: None   Collection Time: 10/14/18  5:23 PM  Result Value Ref Range Status   Specimen Description   Final    URINE, CLEAN CATCH Performed at [facility], [address], [place], [place] [address]    Special Requests   Final    NONE Performed at [facility], [address], [place], [place] [address]    Culture   Final    NO GROWTH Performed at [facility], [address] [place], [place] [address]    Report Status 10/16/2018 FINAL  Final  Radiology Studies: No results found.      Scheduled Meds: . butamben-tetracaine-benzocaine  1 spray Topical Once  . calcium carbonate (dosed in mg elemental calcium)  1,000 mg of elemental calcium Oral BID WC  . feeding supplement  1 Container Oral TID BM  . feeding supplement (PRO-STAT SUGAR FREE 64)  30 mL Oral BID  . folic acid  1 mg Intravenous Daily  . free water  200 mL Per Tube Q8H  . heparin injection (subcutaneous)  5,000 Units Subcutaneous Q8H  . lipase/protease/amylase  24,000 Units Oral With snacks  . lipase/protease/amylase  36,000 Units Oral TID WC   . multivitamin  15 mL Oral Daily  . nicotine  21 mg Transdermal Daily  . nystatin  5 mL Oral QID  . pantoprazole  40 mg Oral Daily  . thiamine injection  100 mg Intravenous Daily   Continuous Infusions: . dextrose 5 % and 0.45 % NaCl with KCl 40 mEq/L 75 mL/hr at 10/22/18 1147  . feeding supplement (OSMOLITE 1.5 CAL)    . magnesium sulfate bolus IVPB 2 g (10/22/18 1149)     LOS: 8 days    Time spent: 30 minutes    Kona Yusuf Hoover Brunette, DO [facility] Pager [phone]  If 7PM-7AM, please contact night-coverage [email or web address] Password [facility] 10/22/2018, 12:42 PM

## 2018-10-22 NOTE — Op Note (Signed)
[facility] Operative Note  10/22/18  Preoperative Diagnosis: Failure to thrive, dysphagia, acute on chronic pancreatitis with pseudocyst    Postoperative Diagnosis: Same   Procedure(s) Performed: Percutaneous Endoscopic Gastrostomy Tube   Surgeon: Leatrice Jewels. Henreitta Leber, MD   Assistants: Franky Macho, MD    Anesthesia: Monitored anesthesia care   Anesthesiologist: Shona Needles, MD    Estimated Blood Loss: Minimal   Complications: None   Wound Class: Clean contaminated   Operative Indications: The patient is a 48 year old with acute on chronic pancreatitis from alcohol abuse causing pseudocyst formation with some compression of the esophagogastric junction. She has been tried on tube feeds through and NJ tube and tolerated this feeds, and I was asked to place a PEG tube for feeds. She had a prior EGD that demonstrated no abnormality of the stomach or antrum and a CT that demonstrated no gastric outlet obstruction. The patient needs prolonged enteral nutrition because of pain and dysphagia associated with her pseudocyst formation and the compression of the esophagogastric junction. Given this prolonged need a percutaneous endoscopic gastrostomy tube was chosen due to the less invasive nature of the procedure. I discussed with the patient that she might not tolerate feeds with the PEG and that if this occurs she may need a conversion to GJ with IR in the next week. We discussed the risk and benefits of the procedure including but not limited to bleeding, infection, risk of injury to the esophagus or stomach, and need for possible open gastrostomy tube.   Findings:  Open antrum, no abnormality noted in the stomach, no difficulty getting through the EGJ    Procedure: The patient was taken to the operating room and placed supine. Monitored anesthesia care was induced. Intravenous antibiotics were administered per protocol. A JACHO approved time out was performed.  After  adequate sedation and positioning, a mouth piece was placed into the patient's mouth and the endoscope was passed down into the stomach. No abnormalities were noted.  The duodenum was intubated and noted to have no gross abnormalities. The stomach was insufflated with air and the endoscope was positioned in the midportion and directed to the anterior abdominal wall. With the room darkened, a good light reflex was noted in the left upper quadrant.  Finger pressure was applied at the light reflex with adequate indentation on the stomach wall on endoscopy. A snare was passed into the stomach, opened fully, and positioned to encircle the point of demonstrated finger indentation.   The skin was prepped, and the area was anesthetized with lidocaine and a 1cm incision was made.  The introducer needle was passed through the incision into the stomach under direct visualization with the gastroscope. The catheter was encircled with the snare, and the guidewire was passed and captured by the snare. The endoscope, snare, and guidewire were the withdrawn and pulled back into the mouth. The gastrostomy tube was attached to the loop of the guidewire and the whole thing was pulled back into the stomach. The gastrostomy tube was noted to be at the 2cm mark at the skin level and at the 3cm mark at the top of the bumper.  The gastroscope was reintroduced and adequate placement was confirmed and a picture was taken. The gastrostomy tube was cute to length and the bumper and clamping appendage were placed.  The tube was secured with an ABD and a abdominal binder. The patient tolerated the procedure well and was taken to the PACU in good condition.  Dr. Lovell SheehanJenkins was assisted with the incision and abdominal portion of the procedure.     Algis GreenhouseLindsay Bridges, MD [facility] [address] [place], [place] [address] [phone] (office)

## 2018-10-22 NOTE — Addendum Note (Signed)
Addended by: Corrie Mckusick on: 10/22/2018 09:45 AM   Modules accepted: Orders

## 2018-10-22 NOTE — Telephone Encounter (Signed)
Urgent referral faxed to [facility]

## 2018-10-22 NOTE — Anesthesia Preprocedure Evaluation (Signed)
Anesthesia Evaluation  Patient identified by MRN, date of birth, ID band Patient awake    Reviewed: Allergy & Precautions, NPO status , Patient's Chart, lab work & pertinent test results  Airway Mallampati: II  TM Distance: >3 FB Neck ROM: Full    Dental no notable dental hx. (+) Missing, Poor Dentition, Chipped   Pulmonary neg pulmonary ROS, Current Smoker,    Pulmonary exam normal breath sounds clear to auscultation       Cardiovascular Exercise Tolerance: Good + angina at rest Normal cardiovascular examI Rhythm:Regular Rate:Normal     Neuro/Psych Anxiety Depression negative neurological ROS  negative psych ROS   GI/Hepatic Neg liver ROS, GERD  Medicated and Controlled,Here with pancreatitis, with abnl finding on CT scan -here for EGD today. C/O CP/SOB at rest -appears in NAD   Endo/Other  negative endocrine ROS  Renal/GU negative Renal ROS  negative genitourinary   Musculoskeletal negative musculoskeletal ROS (+)   Abdominal   Peds negative pediatric ROS (+)  Hematology negative hematology ROS (+)   Anesthesia Other Findings   Reproductive/Obstetrics negative OB ROS                             Anesthesia Physical  Anesthesia Plan  ASA: III  Anesthesia Plan: MAC   Post-op Pain Management:    Induction: Intravenous  PONV Risk Score and Plan:   Airway Management Planned: Nasal Cannula and Simple Face Mask  Additional Equipment:   Intra-op Plan:   Post-operative Plan:   Informed Consent: I have reviewed the patients History and Physical, chart, labs and discussed the procedure including the risks, benefits and alternatives for the proposed anesthesia with the patient or authorized representative who has indicated his/her understanding and acceptance.     Dental advisory given  Plan Discussed with: CRNA  Anesthesia Plan Comments: (Full PPE use planned  GA planned  with GETA back up as needed d/w PT- WTP same after Q&A)        Anesthesia Quick Evaluation

## 2018-10-22 NOTE — Progress Notes (Addendum)
[facility]  Patient had PEG placed today. Can resume feeds at 1630.  Keep abdominal binder in place.  Updated Marcial Pacas, boyfriend regarding PEG.  Can have clears at 1230 and then up to her prior diet at 1630.   If patient tolerates feeds, she would be able to go home tomorrow. Will need a PCP to arrange for home health for the longterm.   Will update Dr. Sherryll Burger.   Algis Greenhouse, MD [facility] [address] [place], [place] [address] [phone] (office)

## 2018-10-22 NOTE — Anesthesia Postprocedure Evaluation (Signed)
Anesthesia Post Note Late Entry for 1130  Patient: Monique Nguyen  Procedure(s) Performed: PERCUTANEOUS ENDOSCOPIC GASTROSTOMY (PEG) PLACEMENT with endoscopy (N/A ) ESOPHAGOGASTRODUODENOSCOPY (EGD) WITH PROPOFOL (N/A )  Patient location during evaluation: Other Anesthesia Type: MAC Level of consciousness: awake and alert and oriented Pain management: pain level controlled Vital Signs Assessment: post-procedure vital signs reviewed and stable Respiratory status: spontaneous breathing Cardiovascular status: stable Postop Assessment: no headache Anesthetic complications: no     Last Vitals:  Vitals:   10/22/18 1115 10/22/18 1145  BP: 98/67 104/72  Pulse: (!) 113 (!) 121  Resp: 18 18  Temp:  37.5 C  SpO2: 97% 97%    Last Pain:  Vitals:   10/22/18 1145  TempSrc: Oral  PainSc:                  Shawana Knoch A

## 2018-10-22 NOTE — Progress Notes (Signed)
PEG tube placement today per Surgery. Will reassess patient tomorrow.

## 2018-10-23 ENCOUNTER — Encounter (HOSPITAL_COMMUNITY): Payer: Self-pay | Admitting: General Surgery

## 2018-10-23 DIAGNOSIS — E43 Unspecified severe protein-calorie malnutrition: Secondary | ICD-10-CM

## 2018-10-23 DIAGNOSIS — R627 Adult failure to thrive: Secondary | ICD-10-CM

## 2018-10-23 DIAGNOSIS — K859 Acute pancreatitis without necrosis or infection, unspecified: Secondary | ICD-10-CM

## 2018-10-23 DIAGNOSIS — K861 Other chronic pancreatitis: Secondary | ICD-10-CM

## 2018-10-23 LAB — BASIC METABOLIC PANEL
Anion gap: 6 (ref 5–15)
BUN: 22 mg/dL — ABNORMAL HIGH (ref 6–20)
CO2: 26 mmol/L (ref 22–32)
Calcium: 7.7 mg/dL — ABNORMAL LOW (ref 8.9–10.3)
Chloride: 101 mmol/L (ref 98–111)
Creatinine, Ser: 0.43 mg/dL — ABNORMAL LOW (ref 0.44–1.00)
GFR calc Af Amer: >60 mL/min (ref >60)
GFR calc non Af Amer: >60 mL/min (ref >60)
Glucose, Bld: 163 mg/dL — ABNORMAL HIGH (ref 70–99)
Potassium: 5 mmol/L (ref 3.5–5.1)
Sodium: 133 mmol/L — ABNORMAL LOW (ref 135–145)

## 2018-10-23 LAB — MAGNESIUM: Magnesium: 2.1 mg/dL (ref 1.7–2.4)

## 2018-10-23 MED ORDER — OSMOLITE 1.5 CAL PO LIQD: 1000.0000 mL | ORAL | 3 refills | Status: DC

## 2018-10-23 MED ORDER — PANCRELIPASE (LIP-PROT-AMYL) 24000-76000 UNITS PO CPEP: 24000.0000 [IU] | ORAL_CAPSULE | ORAL | 3 refills | Status: DC

## 2018-10-23 MED ORDER — HYDROCODONE-ACETAMINOPHEN 5-325 MG PO TABS: 1.0000 | ORAL_TABLET | ORAL | 0 refills | Status: AC | PRN

## 2018-10-23 MED ORDER — PANTOPRAZOLE SODIUM 40 MG PO TBEC: 40.0000 mg | DELAYED_RELEASE_TABLET | Freq: Every day | ORAL | 3 refills | Status: DC

## 2018-10-23 MED ORDER — CALCIUM CARBONATE ANTACID 1250 MG/5ML PO SUSP: 1000.0000 mg | Freq: Three times a day (TID) | ORAL | 3 refills | Status: AC

## 2018-10-23 MED ORDER — PRO-STAT SUGAR FREE PO LIQD: 30.0000 mL | Freq: Two times a day (BID) | ORAL | 0 refills | Status: DC

## 2018-10-23 MED ORDER — PANCRELIPASE (LIP-PROT-AMYL) 36000-114000 UNITS PO CPEP: 36000.0000 [IU] | ORAL_CAPSULE | Freq: Three times a day (TID) | ORAL | 3 refills | Status: DC

## 2018-10-23 NOTE — Progress Notes (Signed)
[facility] Progress Note  1 Day Post-Op  Subjective: Patient tolerating her PEG feed at goal. No issues reported. She is a little anxious at baseline and anxious about going home. Overall did well last night. Some mild pain related to the PEG placement.   Objective: Vital signs in last 24 hours: Temp:  [97.9 F (36.6 C)-99.5 F (37.5 C)] 97.9 F (36.6 C) (05/12 0559) Pulse Rate:  [107-140] 115 (05/12 0559) Resp:  [13-21] 16 (05/11 2136) BP: (87-112)/(57-90) 106/90 (05/12 0559) SpO2:  [97 %-100 %] 98 % (05/12 0804) Weight:  [48.8 kg] 48.8 kg (05/12 0559) Last BM Date: 10/21/18  Intake/Output from previous day: 05/11 0701 - 05/12 0700 In: 3221.9 [P.O.:120; I.V.:1923.9; NG/GT:1128; IV Piggyback:50] Out: 350 [Urine:350] Intake/Output this shift: No intake/output data recorded.  General appearance: alert, cooperative and no distress Resp: normal work of breathing GI: soft, nondistended, PEG taped down and binder on, PEG at 3.5 at the top of the bumper, gauze under bumper with minimal discharge   BMET Recent Labs    10/22/18 0558 10/23/18 0548  NA 136 133*  K 3.4* 5.0  CL 102 101  CO2 25 26  GLUCOSE 125* 163*  BUN 18 22*  CREATININE 0.31* 0.43*  CALCIUM 8.0* 7.7*     Assessment/Plan: Ms. Werth is a 48 yo s/p PEG For severe malnutrition, dysphagia in the setting of acute on chronic pancreatitis with pseudocyst formation and some mild compression of the EGJ.  Doing well with PEG feeds. -Continue feeds per Nutrition -Will need a PCP to follow her long term -Have charity care arranged by social work for [employer] and feeds -Will see in the clinic in 2 weeks to check that PEG feeds going well and not having issues   Discussed with Dr. Sherryll Burger.    LOS: 9 days    Lucretia Roers 10/23/2018

## 2018-10-23 NOTE — Progress Notes (Signed)
IV removed by NT, patient tolerated well. Reviewed AVS with patient and patient's boyfriend,  Both verbalized understanding.  Patient transported to lobby via wheelchair and transported home via her boyfriend.

## 2018-10-23 NOTE — Discharge Instructions (Signed)
PEG Care (Feeding Tube Care) -Flush the tube twice a day with 20cc of water from the tap to clear it out if you are not receiving feeds.  -Make sure the top of your bumper remains at 3.5 (dot between 3 and 4). - It is important to keep it at this mark so that the tube is not too tight or too loose.  -Keep the tube taped down when not in use or with activity. -Wear an abdominal binder, or ace wrap around your waste to keep the tube in place, especially with sleeping.  -Follow your feeding instructions per your home health care RN.  -If your tube falls out or gets pulled out, go to the ED immediately. If it is within 6-8 weeks, there is a possibility that you will need surgery.   After 6-8 weeks, a new tube can be reinserted, but has to be done quickly because the hole heals up fast.  -You may shower and dry the area and tube off after showering. DO NOT SUBMERGE IN A BATH.   PEG Tube Home Guide A PEG tube is used to put food and fluids into the stomach. Before you leave the hospital, make sure that you know:  How to care for your PEG tube.  How to care for the opening (stoma) in your belly.  How to give yourself a feeding.  How to give yourself medicines.  When to call your doctor for help. Supplies needed:  [employer], plain water  Clean washcloth  Gauze pad  Syringe How to care for a PEG tube Check your PEG tube every day. Make sure:  It is not too tight.  It is in the correct place. There is a mark on the tube that shows when the tube is in the correct place. Adjust the tube if you need to. Cleaning your stoma Clean your stoma every day. Follow these steps: 1. Wash your hands with soap and water. If soap and water are not available, use hand sanitizer. 2. Check your stoma. Let your doctor know if there is: ? Redness. ? Leaking. ? Skin irritation. 3. Wash the stoma gently with warm, soapy water. 4. Rinse the stoma with plain water. 5. Pat the stoma area  dry. 6. You may place a gauze pad around the opening of your stoma.  Giving a feeding Your doctor will tell you:  How much nutrition and fluid you will need for each feeding.  How often to have a feeding.  Whether you should take medicine in the tube by itself or with a feeding. To give yourself a feeding, follow these steps: 1. Lay out all of the things that you will need. 2. Make sure that the nutritional formula is at room temperature. 3. Wash your hands with soap and water. 4. Sit up or stand up straight. You will need to stay sitting up or standing up while you give yourself a feeding. 5. Make sure the syringe plunger is pushed in. Place the tip of the syringe in clean water, and slowly pull the plunger to bring (draw up) the water into the syringe. 6. Remove the clamp and the cap from the PEG tube. 7. Push the water out of the syringe to clean (flush) the tube. 8. If the tube is clear, draw up the formula into the syringe. Make sure to use the right amount for each feeding. Add water if you need to. 9. Slowly push the formula from the syringe through the  tube. 10. After the feeding, flush the tube with water. 11. Put the clamp and the cap on the tube. 12. Stay sitting up or standing up straight for at least 30 minutes. Giving medicine To give yourself medicine, follow these steps: 1. Lay out all of the things that you will need. 2. If your medicine is in tablet form, crush it and dissolve it in water. 3. Wash your hands with soap and water. 4. Sit up or stand up straight. You will need to stay sitting up or standing up while you give yourself medicine. 5. Make sure the syringe plunger is pushed in. Place the tip of the syringe in clean water, and slowly pull the plunger to bring (draw up) the water into the syringe. 6. Remove the clamp and the cap from the PEG tube. 7. Push the water out of the syringe to clean (flush) the tube. 8. If the tube is clear, draw up the medicine  into the syringe. 9. Slowly push the medicine from the syringe through the tube. 10. Flush the tube with water. 11. Put the clamp and the cap on the tube. 12. Stay sitting up or standing up straight for at least 30 minutes. Do not take sustained release (SR) medicines through your tube. If you are not sure if your medicine is a SR medicine, ask your doctor. Contact a doctor if:  The area around your stoma is sore, irritated, or red.  You have belly pain or bloating while you are feeding, or after you feed.  You feel sick to your stomach (nausea) for a long time.  You cannot poop (constipated) or you have watery poop (diarrhea) for a long time.  You have a fever.  You have problems with your PEG tube. Get help right away if:  Your tube is blocked.  Your tube falls out.  You have pain around your tube.  You are bleeding from your tube.  Your tube is leaking.  You choke or you have trouble breathing while you are feeding or after you feed. Summary  A PEG tube is used to put food and fluids into the stomach.  Before you leave the hospital, you will be taught how to use and care for your PEG tube.  Your doctor will give you instructions on how to give yourself feedings and medicines.  Contact your doctor if you have fever or soreness, redness, or irritation around your stoma.  Get help right away if your tube leaks, is blocked, or falls out. Also, get help right away if you have pain or bleeding around your stoma. This information is not intended to replace advice given to you by your health care provider. Make sure you discuss any questions you have with your health care provider. Document Released: 07/02/2010 Document Revised: 06/12/2017 Document Reviewed: 06/12/2017 Elsevier Interactive Patient Education  2019 [employer]

## 2018-10-23 NOTE — Progress Notes (Signed)
    Subjective: Today patient has no acute complaints. She has mild abdominal discomfort after placement of PEG yesterday. Endorses some nausea, but no vomiting. BM last night. No constipation or diarrhea. She is looking forward to going home.   Objective: Vital signs in last 24 hours: Temp:  [97.9 F (36.6 C)-99.5 F (37.5 C)] 97.9 F (36.6 C) (05/12 0559) Pulse Rate:  [107-140] 115 (05/12 0559) Resp:  [13-21] 16 (05/11 2136) BP: (87-112)/(57-90) 106/90 (05/12 0559) SpO2:  [97 %-100 %] 98 % (05/12 0804) Weight:  [48.8 kg] 48.8 kg (05/12 0559) Last BM Date: 10/21/18 General:   Alert and oriented, pleasant Abdomen:  Bowel sounds present, soft, mildly distended. Moderate TTP.  Neurologic:  Alert and  oriented x4 Psych:  Alert and cooperative. Normal mood and affect.  Intake/Output from previous day: 05/11 0701 - 05/12 0700 In: 3221.9 [P.O.:120; I.V.:1923.9; NG/GT:1128; IV Piggyback:50] Out: 350 [Urine:350] Intake/Output this shift: No intake/output data recorded.  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 Tolerating feedings at this point and clinically stable. Appropriate for discharge home. We are in process of arranging urgent referral to Dr. Corliss Parish at [facility] due to history of chronic pancreatitis, pseudocysts, pancreatic duct stones, failure to thrive. Referral faxed 10/22/2018. Continue pancreatic enzyme supplementation, PPI. Will sign off as patient likely to be discharged today.  Gelene Mink, PhD, ANP-BC [facility]       LOS: 9 days    10/23/2018, 9:28 AM

## 2018-10-23 NOTE — Clinical Social Work Note (Signed)
LCSW spoke with Eugenio Hoes with [facility] and advised of discharge today.  LCSW left a message for Therisa Doyne at [employer] advising of patient's discharge today.  LCSW spoke with Lamar Laundry at [facility] and confirmed telephone appointment with Dr. Linton Rump on 10/25/2018.  This is a telephone appointment. If patient feels that [facility] is too far once in office visits resume she can be transferred to [facility] which is under [facility] for ongoing care. Patient's contact information reviewed for accuracy with office. H&P and discharge summary faxed to [facility] at [phone]. Appointment put on patient's AVS and discussed with RN, Reuel Boom.    Kamilia Carollo, Juleen China, LCSW

## 2018-10-23 NOTE — Discharge Summary (Signed)
Physician Discharge Summary  Monique Nguyen ZOX:[id number] DOB: 1970/11/04 DOA: 10/14/2018  PCP: Patient, No Pcp Per  Admit date: 10/14/2018  Discharge date: 10/23/2018  Admitted From:Home  Disposition:  Home  Recommendations for Outpatient Follow-up:  1. Follow up [facility] on [facility]15 as scheduled 2. Follow-up with [name], GI at [facility] with referral that has been faxed over by gastroenterology 3. Follow-up with Dr. Henreitta Leber of general surgery in 2 weeks 4. Continue on Creon and PPI as ordered  Home Health: With RN for assistance with infusions  Equipment/Devices: Home infusion for tube feeding has been set up  Discharge Condition: Stable  CODE STATUS: Full  Diet recommendation: Full liquid with tube feedings as ordered  Brief/Interim Summary: Per HPI: 48 y.o.femalewith past medical history significant for tobacco abuse, depression and/anxiety, gastroesophageal reflux disease, history of alcoholic pancreatitis and psoriasis; who presented to the hospital secondary to increased abdominal pain, nausea, vomiting and inability to eat. Patient reports that for the last month she has no being able to eat essentially anything (including having trouble even with liquids). She has no significant amount of weight and expressed increased abdominal pain. Pain is localized in her epigastric area/mid section, radiated to the left side of back, 10 out of 10 in intensity, no alleviating factors, associated with anorexia, nausea and intermittent episodes of vomiting (there has not been any blood seen in the vomit content). Patient denies any fever, chills, chest pain, shortness of breath, hematuria, dysuria, melena, hematochezia, focal weakness or any other complaints.  There has not been any sick contacts or recent travels.  Patient has been admitted with acute on chronic pancreatitis in the setting of some failure to thrive. She has undergone EGD on 5/5 which was unremarkable.She  has undergone placement of NJ tube on 5/6.She is now working on advancement to her goal rate today and is having further electrolyte supplementation, but is having slow improvements. Her nasojejunal tube was dislodged on 5/9 and therefore was removed. General surgery was consulted for PEG tube placement on 5/10 and she has undergone placement on 5/11 with no complications noted.  She is now tolerating her tube feeds at goal rate of 40 cc/h and is ready for discharge.  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  She is currently tolerating her PEG tube feeds.  She will need to remain on PPI and Creon as otherwise prescribed by gastroenterology and has been given 10 pills of Norco for management of pain related complaints on a temporary basis.  She will follow-up with PCP as noted above.  No other acute events otherwise noted and patient is stable for discharge.  Discharge Diagnoses:  Principal Problem:   Acute on chronic pancreatitis (HCC) Active Problems:   Depression   Hypokalemia   Adult failure to thrive   GERD (gastroesophageal reflux disease)   Dehydration   Chronic pancreatitis (HCC)   Dysphagia   Protein-calorie malnutrition, severe   Odynophagia    Discharge Instructions  Discharge Instructions    Diet - low sodium heart healthy   Complete by:  As directed    Increase activity slowly   Complete by:  As directed      Allergies as of 10/23/2018      Reactions   Doxycycline Other (See Comments)   headaches   Sulfa Antibiotics Nausea And Vomiting, Other (See Comments)   Blurred vision and headaches      Medication List  TAKE these medications   calcium carbonate (dosed in mg elemental calcium) 1250 MG/5ML Susp Take 10 mLs (1,000 mg of elemental calcium total) by mouth 3 (three) times daily with meals for 30 days.   feeding supplement (OSMOLITE 1.5 CAL) Liqd Place 1,000 mLs into feeding  tube continuous.   feeding supplement (PRO-STAT SUGAR FREE 64) Liqd Take 30 mLs by mouth 2 (two) times daily.   HYDROcodone-acetaminophen 5-325 MG tablet Commonly known as:  NORCO/VICODIN Take 1 tablet by mouth every 4 (four) hours as needed for up to 7 days for moderate pain.   Pancrelipase (Lip-Prot-Amyl) 24000-76000 units Cpep Take 1 capsule (24,000 Units total) by mouth with snacks for 30 days.   lipase/protease/amylase 37482 UNITS Cpep capsule Commonly known as:  CREON Take 1 capsule (36,000 Units total) by mouth 3 (three) times daily with meals.   pantoprazole 40 MG tablet Commonly known as:  PROTONIX Take 1 tablet (40 mg total) by mouth daily for 30 days. Start taking on:  Oct 24, 2018      Follow-up Information    [facility]/Dr. Linton Rump. Call in 8 day(s).   Why:  You will have a telephone visit on 10/26/2018 @ 2:30. This initial appointment is a telephone visit. They will make an in office visit once they start seeing patients in the office again.  Contact information: [address] [place], [place] [address] [phone]       Lucretia Roers, MD Follow up in 2 week(s).   Specialty:  General Surgery Why:  PEG follow up  Contact information: [address] [place] [facility] [address] [phone]          Allergies  Allergen Reactions  . Doxycycline Other (See Comments)    headaches  . Sulfa Antibiotics Nausea And Vomiting and Other (See Comments)    Blurred vision and headaches    Consultations:  GI  General surgery   Procedures/Studies: Ct Abdomen Pelvis W Contrast  Result Date: 10/14/2018 CLINICAL DATA:  Inpatient. Failure to thrive. Chronic abdominal pain. Difficulty swallowing with sensation of food sticking in throat. Weight loss. History of pancreatitis. EXAM: CT ABDOMEN AND PELVIS WITH CONTRAST TECHNIQUE: Multidetector CT imaging of the abdomen and pelvis was performed using the standard protocol following bolus administration of  intravenous contrast. CONTRAST:  64mL OMNIPAQUE IOHEXOL 300 MG/ML SOLN, 51mL OMNIPAQUE IOHEXOL 300 MG/ML SOLN COMPARISON:  10/14/2018 abdominal radiographs. 08/04/2017 CT abdomen/pelvis. FINDINGS: Motion degraded scan, partially limiting assessment. Lower chest: Right middle lobe 2 mm solid pulmonary nodule (series 5/image 1), not previously imaged. Additional tiny 2 mm pulmonary nodules at both lung bases are unchanged from 2017 CT, considered benign. Irregular thick walled 2.5 x 1.7 x 3.2 cm fluid collection along the anterior margin of the lower thoracic esophagus (series 2/image 11), new since 08/04/2017 CT. Similar irregular thick walled curvilinear fluid collection posterior to the esophagogastric junction extending into the right retrocrural region measuring 3.2 x 1.5 x 4.1 cm (series 2/image 17), new. Hepatobiliary: Normal liver size. No liver mass. Normal gallbladder with no radiopaque cholelithiasis. No biliary ductal dilatation. CBD diameter 5 mm. Pancreas: Chronic atrophy of distal body and tail of the pancreas with associated pancreatic duct dilation in these pancreatic segments, compatible with chronic pancreatitis. New thick-walled 4.1 x 2.9 x 3.5 cm pancreatic head cystic lesion (series 2/image 26). Posterior pancreatic head 1.0 x 0.8 cm cystic lesion (series 2/image 28), mildly increased from 0.7 x 0.6 cm. New clustered cystic pancreatic lesions in the pancreatic body,  largest 2.4 x 1.7 cm anteriorly (series 2/image 21). No significant acute peripancreatic fat stranding. Spleen: Normal size. No mass. Adrenals/Urinary Tract: Normal adrenals. Simple 1.1 cm upper left renal cyst. Otherwise normal kidneys, with no hydronephrosis. Chronic mild diffuse bladder wall thickening with moderate bladder distention. Stomach/Bowel: Mild wall thickening throughout the esophagogastric junction. Irregular thick walled fluid collection along the posterior margin of the antropyloric stomach measuring approximately 3.7  x 2.0 x 5.1 cm (series 2/image 31), new, exerting mild extrinsic mass-effect on the distal stomach. Stomach is not significantly distended. Normal caliber small bowel with no small bowel wall thickening. Oral contrast transits to the distal small bowel. Normal appendix. Normal large bowel with no diverticulosis, large bowel wall thickening or pericolonic fat stranding. Vascular/Lymphatic: Atherosclerotic nonaneurysmal abdominal aorta. Patent hepatic, portal and renal veins. Diminutive splenic vein, which remains patent, unchanged. No pathologically enlarged lymph nodes in the abdomen or pelvis. New ill-defined curvilinear soft tissue in the central mesentery, which appears to communicate with the posterior distal gastric fluid collection. Reproductive: Grossly normal uterus. No discrete adnexal mass. Small volume hemoperitoneum in the pelvic cul-de-sac. Other: No pneumoperitoneum.  No abdominal ascites. Musculoskeletal: No aggressive appearing focal osseous lesions. IMPRESSION: 1. 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 Redemonstrated findings of chronic pancreatitis in the distal body and tail of the pancreas. 2. New irregular thick walled fluid collections encasing the esophagogastric junction anteriorly and posteriorly with associated wall thickening throughout the esophagogastric junction. Similar new irregular thick walled fluid collection along the posterior margin of the antropyloric stomach with some mass effect on the distal stomach, which is not significantly distended. These nonspecific findings also presumably represent pancreatic pseudocysts with reactive wall thickening at the esophagogastric junction and distal stomach. Infected collections cannot be excluded. 3. Close CT or MRI abdomen follow-up imaging advised. 4. No biliary ductal dilatation.  No radiopaque cholelithiasis. 5. Nonspecific small  volume hemoperitoneum in the pelvic cul-de-sac. No discrete adnexal mass. Suggest follow-up pelvic ultrasound on a short-term basis when clinically feasible. 6. Nonspecific chronic diffuse bladder wall thickening. Moderate bladder distention. No hydronephrosis. 7. Right middle lobe 2 mm solid pulmonary nodule, not previously imaged. No follow-up needed if patient is low-risk. Non-contrast chest CT can be considered in 12 months if patient is high-risk. This recommendation follows the consensus statement: Guidelines for Management of Incidental Pulmonary Nodules Detected on CT Images:From the Fleischner Society 2017; published online before print (10.1148/radiol.[id number]). 8.  Aortic Atherosclerosis (ICD10-I70.0). Electronically Signed   By: Delbert Phenix M.D.   On: 10/14/2018 12:49   Dg Abdomen Acute W/chest  Result Date: 10/14/2018 CLINICAL DATA:  Vomiting EXAM: DG ABDOMEN ACUTE W/ 1V CHEST COMPARISON:  08/04/2017 CT abdomen/pelvis. 10/09/2014 chest radiograph. FINDINGS: Stable cardiomediastinal silhouette with normal heart size. No pneumothorax. No pleural effusion. Lungs appear clear, with no acute consolidative airspace disease and no pulmonary edema. No dilated small bowel loops or air-fluid levels. Moderate gas and mild stool throughout the colon. No evidence of pneumatosis or pneumoperitoneum. No radiopaque nephrolithiasis. IMPRESSION: 1. No active cardiopulmonary disease. 2. Nonobstructive bowel gas pattern. Moderate gas and mild stool in the colon. Electronically Signed   By: Delbert Phenix M.D.   On: 10/14/2018 09:25   Dg Vangie Bicker G Tube Plc W/fl W/rad  Result Date: 10/17/2018 CLINICAL DATA:  Feeding tube placement, chronic pancreatitis, weight loss, failure to thrive EXAM: NASO G TUBE PLACEMENT WITH FL AND WITH RAD CONTRAST:  15 mL  ISOVUE-300 IOPAMIDOL (ISOVUE-300) INJECTION 61% FLUOROSCOPY TIME:  Fluoroscopy Time:  3 minutes 12 seconds Radiation Exposure Index (if provided by the fluoroscopic device):  33.9 mGy Number of Acquired Spot Images: Single cine fluoroscopic series COMPARISON:  CT abdomen and pelvis 10/14/2018 FINDINGS: Pharynx was anesthetized with Cetacaine spray. Utilizing lubricant, a feeding tube was passed through the RIGHT nasal passages in the hypopharynx. With patient swallowing and neck flexed, feeding tube was advanced into the thoracic esophagus and down into the stomach. Catheter was manipulated and placed across the pylorus into the duodenum. Catheter was advanced to the level of ligament of Treitz. Contrast injection confirms presence of the catheter tip at the ligament of Treitz. IMPRESSION: Placement of nasogastric feeding tube to the ligament of Treitz. Electronically Signed   By: Ulyses SouthwardMark  Boles M.D.   On: 10/17/2018 18:07   Dg Esophagus W Single Cm (sol Or Thin Ba)  Result Date: 10/15/2018 CLINICAL DATA:  Dysphagia, lower esophageal and epigastric pain EXAM: ESOPHOGRAM / BARIUM SWALLOW / BARIUM TABLET STUDY TECHNIQUE: Combined double contrast and single contrast examination performed using effervescent crystals, thick barium liquid, and thin barium liquid. The patient was observed with fluoroscopy swallowing a 13 mm barium sulphate tablet. FLUOROSCOPY TIME:  Fluoroscopy Time:  1 minutes 30 seconds Radiation Exposure Index (if provided by the fluoroscopic device): 17.9 mGy Number of Acquired Spot Images: multiple fluoroscopic screen captures COMPARISON:  None FINDINGS: Esophageal distention: Normal without mass or stricture Filling defects:  None 12.5 mm barium tablet: Easily passed from oral cavity to stomach without obstruction. Motility:  Normal Mucosa:  Smooth without irregularity or ulceration Hypopharynx/cervical esophagus: Normal motion without laryngeal penetration or aspiration Hiatal hernia:  Absent GE reflux:  Not identified during exam Other:  N/A IMPRESSION: Unremarkable esophagram. Electronically Signed   By: Ulyses SouthwardMark  Boles M.D.   On: 10/15/2018 15:06     Discharge  Exam: Vitals:   10/23/18 0559 10/23/18 0804  BP: 106/90   Pulse: (!) 115   Resp:    Temp: 97.9 F (36.6 C)   SpO2: 100% 98%   Vitals:   10/22/18 1331 10/22/18 2136 10/23/18 0559 10/23/18 0804  BP: 106/67 112/80 106/90   Pulse: (!) 140 (!) 120 (!) 115   Resp: 17 16    Temp: 98.7 F (37.1 C) 99.2 F (37.3 C) 97.9 F (36.6 C)   TempSrc: Oral Oral Oral   SpO2: 98% 100% 100% 98%  Weight:   48.8 kg   Height:        General: Pt is alert, awake, not in acute distress Cardiovascular: RRR, S1/S2 +, no rubs, no gallops Respiratory: CTA bilaterally, no wheezing, no rhonchi Abdominal: Soft, NT, ND, bowel sounds + PEG tube present, clean, dry, and intact Extremities: no edema, no cyanosis    The results of significant diagnostics from this hospitalization (including imaging, microbiology, ancillary and laboratory) are listed below for reference.     Microbiology: Recent Results (from the past 240 hour(s))  SARS Coronavirus 2 (CEPHEID - Performed in [facility] hospital lab), Hosp Order     Status: None   Collection Time: 10/14/18  8:11 AM  Result Value Ref Range Status   SARS Coronavirus 2 NEGATIVE NEGATIVE Final    Comment: (NOTE) If result is NEGATIVE SARS-CoV-2 target nucleic acids are NOT DETECTED. The SARS-CoV-2 RNA is generally detectable in upper and lower  respiratory specimens during the acute phase of infection. The lowest  concentration of SARS-CoV-2 viral copies this assay can detect is 250  copies / mL. A negative result does not preclude SARS-CoV-2 infection  and should not be used as the sole basis for treatment or other  patient management decisions.  A negative result may occur with  improper specimen collection / handling, submission of specimen other  than nasopharyngeal swab, presence of viral mutation(s) within the  areas targeted by this assay, and inadequate number of viral copies  (<250 copies / mL). A negative result must be combined with clinical   observations, patient history, and epidemiological information. If result is POSITIVE SARS-CoV-2 target nucleic acids are DETECTED. The SARS-CoV-2 RNA is generally detectable in upper and lower  respiratory specimens dur ing the acute phase of infection.  Positive  results are indicative of active infection with SARS-CoV-2.  Clinical  correlation with patient history and other diagnostic information is  necessary to determine patient infection status.  Positive results do  not rule out bacterial infection or co-infection with other viruses. If result is PRESUMPTIVE POSTIVE SARS-CoV-2 nucleic acids MAY BE PRESENT.   A presumptive positive result was obtained on the submitted specimen  and confirmed on repeat testing.  While 2019 novel coronavirus  (SARS-CoV-2) nucleic acids may be present in the submitted sample  additional confirmatory testing may be necessary for epidemiological  and / or clinical management purposes  to differentiate between  SARS-CoV-2 and other Sarbecovirus currently known to infect humans.  If clinically indicated additional testing with an alternate test  methodology [phone]) is advised. The SARS-CoV-2 RNA is generally  detectable in upper and lower respiratory sp ecimens during the acute  phase of infection. The expected result is Negative. Fact Sheet for Patients:  BoilerBrush.com.cy Fact Sheet for Healthcare Providers: https://pope.com/ This test is not yet approved or cleared by the [place] FDA and has been authorized for detection and/or diagnosis of SARS-CoV-2 by FDA under an Emergency Use Authorization (EUA).  This EUA will remain in effect (meaning this test can be used) for the duration of the COVID-19 declaration under Section 564(b)(1) of the Act, 21 U.S.C. section 360bbb-3(b)(1), unless the authorization is terminated or revoked sooner. Performed at [facility], [address],  [place], [place] [address]   Culture, Urine     Status: None   Collection Time: 10/14/18  5:23 PM  Result Value Ref Range Status   Specimen Description   Final    URINE, CLEAN CATCH Performed at [facility], [address], [place], [place] [address]    Special Requests   Final    NONE Performed at [facility], [address], [place], [place] [address]    Culture   Final    NO GROWTH Performed at [facility], [address] [place], [place] [address]    Report Status 10/16/2018 FINAL  Final     Labs: BNP (last 3 results) No results for input(s): BNP in the last 8760 hours. Basic Metabolic Panel: Recent Labs  Lab 10/19/18 0529 10/20/18 0553 10/21/18 0559 10/22/18 0558 10/23/18 0548  NA 140 136 137 136 133*  K 4.5 4.5 4.2 3.4* 5.0  CL 108 102 101 102 101  CO2 26 26 27 25 26   GLUCOSE 119* 126* 121* 125* 163*  BUN 10 11 13 18  22*  CREATININE <0.30* <0.30* <0.30* 0.31* 0.43*  CALCIUM 6.7* 7.7* 8.2* 8.0* 7.7*  MG 1.6* 1.9 1.8 1.6* 2.1   Liver Function Tests: Recent Labs  Lab 10/18/18 0444  AST 21  ALT 17  ALKPHOS 64  BILITOT 0.2*  PROT 4.3*  ALBUMIN 1.9*   No results for input(s): LIPASE, AMYLASE in the last 168 hours. No results for input(s): AMMONIA in the last 168 hours. CBC: Recent Labs  Lab 10/17/18 0454 10/18/18 0444  WBC 3.5* 3.6*  HGB 7.9* 7.8*  HCT 23.8* 23.7*  MCV 97.1 97.9  PLT 206 167   Cardiac Enzymes: No results for input(s): CKTOTAL, CKMB, CKMBINDEX, TROPONINI in the last 168 hours. BNP: Invalid input(s): POCBNP CBG: No results for input(s): GLUCAP in the last 168 hours. D-Dimer No results for input(s): DDIMER in the last 72 hours. Hgb A1c No results for input(s): HGBA1C in the last 72 hours. Lipid Profile No results for input(s): CHOL, HDL, LDLCALC, TRIG, CHOLHDL, LDLDIRECT in the last 72 hours. Thyroid function studies No results for input(s): TSH, T4TOTAL, T3FREE, THYROIDAB in the last 72 hours.  Invalid input(s):  FREET3 Anemia work up No results for input(s): VITAMINB12, FOLATE, FERRITIN, TIBC, IRON, RETICCTPCT in the last 72 hours. Urinalysis    Component Value Date/Time   COLORURINE AMBER (A) 10/14/2018 0810   APPEARANCEUR CLEAR 10/14/2018 0810   LABSPEC 1.018 10/14/2018 0810   PHURINE 6.0 10/14/2018 0810   GLUCOSEU NEGATIVE 10/14/2018 0810   HGBUR SMALL (A) 10/14/2018 0810   BILIRUBINUR NEGATIVE 10/14/2018 0810   KETONESUR 20 (A) 10/14/2018 0810   PROTEINUR 100 (A) 10/14/2018 0810   UROBILINOGEN 0.2 03/06/2015 1558   NITRITE POSITIVE (A) 10/14/2018 0810   LEUKOCYTESUR NEGATIVE 10/14/2018 0810   Sepsis Labs Invalid input(s): PROCALCITONIN,  WBC,  LACTICIDVEN Microbiology Recent Results (from the past 240 hour(s))  SARS Coronavirus 2 (CEPHEID - Performed in [facility] hospital lab), Hosp Order     Status: None   Collection Time: 10/14/18  8:11 AM  Result Value Ref Range Status   SARS Coronavirus 2 NEGATIVE NEGATIVE Final    Comment: (NOTE) If result is NEGATIVE SARS-CoV-2 target nucleic acids are NOT DETECTED. The SARS-CoV-2 RNA is generally detectable in upper and lower  respiratory specimens during the acute phase of infection. The lowest  concentration of SARS-CoV-2 viral copies this assay can detect is 250  copies / mL. A negative result does not preclude SARS-CoV-2 infection  and should not be used as the sole basis for treatment or other  patient management decisions.  A negative result may occur with  improper specimen collection / handling, submission of specimen other  than nasopharyngeal swab, presence of viral mutation(s) within the  areas targeted by this assay, and inadequate number of viral copies  (<250 copies / mL). A negative result must be combined with clinical  observations, patient history, and epidemiological information. If result is POSITIVE SARS-CoV-2 target nucleic acids are DETECTED. The SARS-CoV-2 RNA is generally detectable in upper and lower   respiratory specimens dur ing the acute phase of infection.  Positive  results are indicative of active infection with SARS-CoV-2.  Clinical  correlation with patient history and other diagnostic information is  necessary to determine patient infection status.  Positive results do  not rule out bacterial infection or co-infection with other viruses. If result is PRESUMPTIVE POSTIVE SARS-CoV-2 nucleic acids MAY BE PRESENT.   A presumptive positive result was obtained on the submitted specimen  and confirmed on repeat testing.  While 2019 novel coronavirus  (SARS-CoV-2) nucleic acids may be present in the submitted sample  additional confirmatory testing may be necessary for epidemiological  and / or clinical management purposes  to differentiate between  SARS-CoV-2 and other Sarbecovirus currently known to  infect humans.  If clinically indicated additional testing with an alternate test  methodology [phone]) is advised. The SARS-CoV-2 RNA is generally  detectable in upper and lower respiratory sp ecimens during the acute  phase of infection. The expected result is Negative. Fact Sheet for Patients:  BoilerBrush.com.cy Fact Sheet for Healthcare Providers: https://pope.com/ This test is not yet approved or cleared by the [place] FDA and has been authorized for detection and/or diagnosis of SARS-CoV-2 by FDA under an Emergency Use Authorization (EUA).  This EUA will remain in effect (meaning this test can be used) for the duration of the COVID-19 declaration under Section 564(b)(1) of the Act, 21 U.S.C. section 360bbb-3(b)(1), unless the authorization is terminated or revoked sooner. Performed at [facility], [address] [place], [place] [address]   Culture, Urine     Status: None   Collection Time: 10/14/18  5:23 PM  Result Value Ref Range Status   Specimen Description   Final    URINE, CLEAN CATCH Performed at [facility], [address], [place], [place] [address]    Special Requests   Final    NONE Performed at [facility], [address] [place], [place] [address]    Culture   Final    NO GROWTH Performed at [facility], [address], [place], [place] [address]    Report Status 10/16/2018 FINAL  Final     Time coordinating discharge: 40 minutes  SIGNED:   Erick Blinks, DO [facility] 10/23/2018, 10:09 AM  If 7PM-7AM, please contact night-coverage [email or web address] Password [email or web address]

## 2018-10-25 ENCOUNTER — Telehealth: Payer: Self-pay

## 2018-10-25 NOTE — Telephone Encounter (Signed)
Patients husband Marcial Pacas called and left voice mail regarding 2 week f/u apt and a refill on pain meds. Returned call and had to leave voice mail, told patient of upcoming apt that was scheduled for 11/06/2018 at 9am, for the pain meds, patient will need to contact their PCP. Reminder letter mailed for upcoming apt.

## 2018-11-01 ENCOUNTER — Emergency Department (HOSPITAL_COMMUNITY): Payer: Medicaid Other

## 2018-11-01 ENCOUNTER — Inpatient Hospital Stay (HOSPITAL_COMMUNITY)
Admission: EM | Admit: 2018-11-01 | Discharge: 2018-11-07 | DRG: 438 | Disposition: A | Discharge status: Home Health | Payer: Medicaid Other | Attending: Family Medicine | Admitting: Family Medicine

## 2018-11-01 ENCOUNTER — Other Ambulatory Visit: Payer: Self-pay

## 2018-11-01 ENCOUNTER — Encounter (HOSPITAL_COMMUNITY): Payer: Self-pay | Admitting: Emergency Medicine

## 2018-11-01 DIAGNOSIS — E876 Hypokalemia: Secondary | ICD-10-CM | POA: Diagnosis present

## 2018-11-01 DIAGNOSIS — R8281 Pyuria: Secondary | ICD-10-CM | POA: Diagnosis not present

## 2018-11-01 DIAGNOSIS — Z882 Allergy status to sulfonamides status: Secondary | ICD-10-CM

## 2018-11-01 DIAGNOSIS — K860 Alcohol-induced chronic pancreatitis: Secondary | ICD-10-CM | POA: Diagnosis present

## 2018-11-01 DIAGNOSIS — N3289 Other specified disorders of bladder: Secondary | ICD-10-CM | POA: Diagnosis present

## 2018-11-01 DIAGNOSIS — L409 Psoriasis, unspecified: Secondary | ICD-10-CM | POA: Diagnosis present

## 2018-11-01 DIAGNOSIS — I480 Paroxysmal atrial fibrillation: Secondary | ICD-10-CM | POA: Diagnosis present

## 2018-11-01 DIAGNOSIS — F32A Depression, unspecified: Secondary | ICD-10-CM | POA: Diagnosis present

## 2018-11-01 DIAGNOSIS — G8929 Other chronic pain: Secondary | ICD-10-CM | POA: Diagnosis present

## 2018-11-01 DIAGNOSIS — R739 Hyperglycemia, unspecified: Secondary | ICD-10-CM

## 2018-11-01 DIAGNOSIS — R188 Other ascites: Secondary | ICD-10-CM | POA: Diagnosis present

## 2018-11-01 DIAGNOSIS — J690 Pneumonitis due to inhalation of food and vomit: Secondary | ICD-10-CM | POA: Diagnosis present

## 2018-11-01 DIAGNOSIS — R1310 Dysphagia, unspecified: Secondary | ICD-10-CM | POA: Diagnosis present

## 2018-11-01 DIAGNOSIS — F410 Panic disorder [episodic paroxysmal anxiety] without agoraphobia: Secondary | ICD-10-CM | POA: Diagnosis present

## 2018-11-01 DIAGNOSIS — F1010 Alcohol abuse, uncomplicated: Secondary | ICD-10-CM | POA: Diagnosis present

## 2018-11-01 DIAGNOSIS — K219 Gastro-esophageal reflux disease without esophagitis: Secondary | ICD-10-CM | POA: Diagnosis present

## 2018-11-01 DIAGNOSIS — K859 Acute pancreatitis without necrosis or infection, unspecified: Secondary | ICD-10-CM | POA: Diagnosis present

## 2018-11-01 DIAGNOSIS — R748 Abnormal levels of other serum enzymes: Secondary | ICD-10-CM

## 2018-11-01 DIAGNOSIS — R339 Retention of urine, unspecified: Secondary | ICD-10-CM | POA: Diagnosis present

## 2018-11-01 DIAGNOSIS — A419 Sepsis, unspecified organism: Secondary | ICD-10-CM

## 2018-11-01 DIAGNOSIS — F17210 Nicotine dependence, cigarettes, uncomplicated: Secondary | ICD-10-CM | POA: Diagnosis present

## 2018-11-01 DIAGNOSIS — Z800 Family history of malignant neoplasm of digestive organs: Secondary | ICD-10-CM

## 2018-11-01 DIAGNOSIS — R509 Fever, unspecified: Secondary | ICD-10-CM | POA: Diagnosis present

## 2018-11-01 DIAGNOSIS — F329 Major depressive disorder, single episode, unspecified: Secondary | ICD-10-CM | POA: Diagnosis present

## 2018-11-01 DIAGNOSIS — Z931 Gastrostomy status: Secondary | ICD-10-CM

## 2018-11-01 DIAGNOSIS — Z818 Family history of other mental and behavioral disorders: Secondary | ICD-10-CM

## 2018-11-01 DIAGNOSIS — Z881 Allergy status to other antibiotic agents status: Secondary | ICD-10-CM

## 2018-11-01 DIAGNOSIS — Z20828 Contact with and (suspected) exposure to other viral communicable diseases: Secondary | ICD-10-CM | POA: Diagnosis present

## 2018-11-01 DIAGNOSIS — R627 Adult failure to thrive: Secondary | ICD-10-CM | POA: Diagnosis present

## 2018-11-01 DIAGNOSIS — L8990 Pressure ulcer of unspecified site, unspecified stage: Secondary | ICD-10-CM

## 2018-11-01 DIAGNOSIS — D638 Anemia in other chronic diseases classified elsewhere: Secondary | ICD-10-CM | POA: Diagnosis present

## 2018-11-01 DIAGNOSIS — K661 Hemoperitoneum: Secondary | ICD-10-CM | POA: Diagnosis present

## 2018-11-01 DIAGNOSIS — K5900 Constipation, unspecified: Secondary | ICD-10-CM | POA: Diagnosis present

## 2018-11-01 DIAGNOSIS — K861 Other chronic pancreatitis: Secondary | ICD-10-CM | POA: Diagnosis present

## 2018-11-01 DIAGNOSIS — F419 Anxiety disorder, unspecified: Secondary | ICD-10-CM | POA: Diagnosis present

## 2018-11-01 DIAGNOSIS — Z681 Body mass index (BMI) 19 or less, adult: Secondary | ICD-10-CM | POA: Diagnosis not present

## 2018-11-01 DIAGNOSIS — Z9119 Patient's noncompliance with other medical treatment and regimen: Secondary | ICD-10-CM

## 2018-11-01 DIAGNOSIS — K862 Cyst of pancreas: Secondary | ICD-10-CM | POA: Diagnosis present

## 2018-11-01 DIAGNOSIS — R1084 Generalized abdominal pain: Secondary | ICD-10-CM

## 2018-11-01 DIAGNOSIS — K863 Pseudocyst of pancreas: Secondary | ICD-10-CM | POA: Diagnosis present

## 2018-11-01 DIAGNOSIS — E43 Unspecified severe protein-calorie malnutrition: Secondary | ICD-10-CM | POA: Diagnosis present

## 2018-11-01 DIAGNOSIS — Z79899 Other long term (current) drug therapy: Secondary | ICD-10-CM

## 2018-11-01 LAB — MAGNESIUM: Magnesium: 1.8 mg/dL (ref 1.7–2.4)

## 2018-11-01 LAB — COMPREHENSIVE METABOLIC PANEL
ALT: 132 U/L — ABNORMAL HIGH (ref 0–44)
AST: 57 U/L — ABNORMAL HIGH (ref 15–41)
Albumin: 2.4 g/dL — ABNORMAL LOW (ref 3.5–5.0)
Alkaline Phosphatase: 310 U/L — ABNORMAL HIGH (ref 38–126)
Anion gap: 11 (ref 5–15)
BUN: 27 mg/dL — ABNORMAL HIGH (ref 6–20)
CO2: 24 mmol/L (ref 22–32)
Calcium: 9.3 mg/dL (ref 8.9–10.3)
Chloride: 96 mmol/L — ABNORMAL LOW (ref 98–111)
Creatinine, Ser: 0.37 mg/dL — ABNORMAL LOW (ref 0.44–1.00)
GFR calc Af Amer: >60 mL/min (ref >60)
GFR calc non Af Amer: >60 mL/min (ref >60)
Glucose, Bld: 330 mg/dL — ABNORMAL HIGH (ref 70–99)
Potassium: 3.8 mmol/L (ref 3.5–5.1)
Sodium: 131 mmol/L — ABNORMAL LOW (ref 135–145)
Total Bilirubin: 0.6 mg/dL (ref 0.3–1.2)
Total Protein: 6.9 g/dL (ref 6.5–8.1)

## 2018-11-01 LAB — URINALYSIS, ROUTINE W REFLEX MICROSCOPIC
Bilirubin Urine: NEGATIVE
Glucose, UA: >=500 mg/dL — AB
Hgb urine dipstick: NEGATIVE
Ketones, ur: NEGATIVE mg/dL
Leukocytes,Ua: NEGATIVE
Nitrite: NEGATIVE
Protein, ur: 100 mg/dL — AB
Specific Gravity, Urine: 1.036 — ABNORMAL HIGH (ref 1.005–1.030)
pH: 8 (ref 5.0–8.0)

## 2018-11-01 LAB — CBC WITH DIFFERENTIAL/PLATELET
Abs Immature Granulocytes: 0.18 10*3/uL — ABNORMAL HIGH (ref 0.00–0.07)
Basophils Absolute: 0 10*3/uL (ref 0.0–0.1)
Basophils Relative: 0 %
Eosinophils Absolute: 0 10*3/uL (ref 0.0–0.5)
Eosinophils Relative: 0 %
HCT: 28.8 % — ABNORMAL LOW (ref 36.0–46.0)
Hemoglobin: 9.2 g/dL — ABNORMAL LOW (ref 12.0–15.0)
Immature Granulocytes: 1 %
Lymphocytes Relative: 4 %
Lymphs Abs: 0.7 10*3/uL (ref 0.7–4.0)
MCH: 30.7 pg (ref 26.0–34.0)
MCHC: 31.9 g/dL (ref 30.0–36.0)
MCV: 96 fL (ref 80.0–100.0)
Monocytes Absolute: 1.3 10*3/uL — ABNORMAL HIGH (ref 0.1–1.0)
Monocytes Relative: 9 %
Neutro Abs: 13.5 10*3/uL — ABNORMAL HIGH (ref 1.7–7.7)
Neutrophils Relative %: 86 %
Platelets: 946 10*3/uL (ref 150–400)
RBC: 3 MIL/uL — ABNORMAL LOW (ref 3.87–5.11)
RDW: 15.4 % (ref 11.5–15.5)
WBC: 15.8 10*3/uL — ABNORMAL HIGH (ref 4.0–10.5)
nRBC: 0 % (ref 0.0–0.2)

## 2018-11-01 LAB — LACTIC ACID, PLASMA: Lactic Acid, Venous: 1.4 mmol/L (ref 0.5–1.9)

## 2018-11-01 LAB — SARS CORONAVIRUS 2 BY RT PCR (HOSPITAL ORDER, PERFORMED IN ~~LOC~~ HOSPITAL LAB): SARS Coronavirus 2: NEGATIVE

## 2018-11-01 LAB — LIPASE, BLOOD: Lipase: 803 U/L — ABNORMAL HIGH (ref 11–51)

## 2018-11-01 MED ORDER — SODIUM CHLORIDE 0.9 % IV BOLUS
1000.0000 mL | Freq: Once | INTRAVENOUS | Status: AC
  Administered 2018-11-01: 23:00:00 1000 mL via INTRAVENOUS

## 2018-11-01 MED ORDER — LACTATED RINGERS IV BOLUS
1500.0000 mL | Freq: Once | INTRAVENOUS | Status: AC
  Administered 2018-11-01: 18:00:00 1500 mL via INTRAVENOUS

## 2018-11-01 MED ORDER — IOHEXOL 300 MG/ML  SOLN
100.0000 mL | Freq: Once | INTRAMUSCULAR | Status: AC | PRN
  Administered 2018-11-01: 22:00:00 100 mL via INTRAVENOUS

## 2018-11-01 MED ORDER — ONDANSETRON HCL 4 MG/2ML IJ SOLN
4.0000 mg | Freq: Once | INTRAMUSCULAR | Status: AC
  Administered 2018-11-01: 18:00:00 4 mg via INTRAVENOUS
  Filled 2018-11-01: qty 2

## 2018-11-01 MED ORDER — LORAZEPAM 2 MG/ML IJ SOLN
0.5000 mg | Freq: Once | INTRAMUSCULAR | Status: AC
  Administered 2018-11-01: 20:00:00 0.5 mg via INTRAVENOUS
  Filled 2018-11-01: qty 1

## 2018-11-01 MED ORDER — SODIUM CHLORIDE 0.9 % IV SOLN
1.0000 g | Freq: Once | INTRAVENOUS | Status: AC
  Administered 2018-11-01: 1 g via INTRAVENOUS
  Filled 2018-11-01: qty 1

## 2018-11-01 MED ORDER — MORPHINE SULFATE (PF) 4 MG/ML IV SOLN
4.0000 mg | Freq: Once | INTRAVENOUS | Status: AC
  Administered 2018-11-01: 18:00:00 4 mg via INTRAVENOUS
  Filled 2018-11-01: qty 1

## 2018-11-01 MED ORDER — VANCOMYCIN HCL IN DEXTROSE 1-5 GM/200ML-% IV SOLN
1000.0000 mg | Freq: Once | INTRAVENOUS | Status: AC
  Administered 2018-11-01: 1000 mg via INTRAVENOUS
  Filled 2018-11-01: qty 200

## 2018-11-01 MED ORDER — ACETAMINOPHEN 160 MG/5ML PO SOLN
480.0000 mg | Freq: Once | ORAL | Status: AC
  Administered 2018-11-01: 480 mg
  Filled 2018-11-01: qty 20.3

## 2018-11-01 MED ORDER — HYDROMORPHONE HCL 1 MG/ML IJ SOLN
0.5000 mg | Freq: Once | INTRAMUSCULAR | Status: AC
  Administered 2018-11-01: 20:00:00 0.5 mg via INTRAVENOUS
  Filled 2018-11-01: qty 1

## 2018-11-01 MED ORDER — METRONIDAZOLE IN NACL 5-0.79 MG/ML-% IV SOLN
500.0000 mg | Freq: Once | INTRAVENOUS | Status: AC
  Administered 2018-11-01: 500 mg via INTRAVENOUS
  Filled 2018-11-01: qty 100

## 2018-11-01 NOTE — ED Notes (Signed)
[employer] applied 

## 2018-11-01 NOTE — ED Provider Notes (Signed)
[facility] Provider Note   CSN: [id number] Arrival date & time: 11/01/18  1717    History   Chief Complaint Chief Complaint  Patient presents with  . Fever    HPI Monique Nguyen is a 48 y.o. female.     HPI   48yF with fever. She was recently admitted 10/14/18 - 10/23/18 with acute on chronic pancreatitis in setting of failure to thrive. She had PEG tube placed on 10/22/18. Home health nurse was visiting pt today and noted temperature of 101.8 and HR 152.   Pt herself is not a very good historian. She says she hurts all over. She says she is "not tolerating" water. She says she has been taking some by mouth in addition via g-tube and has been vomiting. No diarrhea. Occasional cough. Doesn't feel SOB.   Past Medical History:  Diagnosis Date  . Alcohol abuse   . Anxiety   . Bacterial vaginosis   . Chronic abdominal pain   . Chronic pain   . Depression   . GERD (gastroesophageal reflux disease)   . IBS (irritable bowel syndrome)   . Pancreatitis    ETOH  . Panic attacks   . Psoriasis     Patient Active Problem List   Diagnosis Date Noted  . Odynophagia   . Protein-calorie malnutrition, severe 10/16/2018  . Chronic pancreatitis (HCC)   . Dysphagia   . Acute on chronic pancreatitis (HCC) 10/14/2018  . Adult failure to thrive 10/14/2018  . GERD (gastroesophageal reflux disease) 10/14/2018  . Dehydration 10/14/2018  . 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 Facial cellulitis 09/10/2012  . Insect bite 09/10/2012  . Depression 09/10/2012  . Anxiety 09/10/2012  . Hypokalemia 09/10/2012  . Tobacco abuse 09/10/2012  . Alcohol dependence (HCC) 06/28/2012  . Benzodiazepine abuse, continuous (HCC) 06/28/2012  . Suicide threat or attempt 06/28/2012    Class: Acute  . 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 Diarrhea 11/11/2010  . Nausea 11/11/2010  . Vaginal discharge 11/11/2010    Past Surgical History:   Procedure Laterality Date  . BLADDER SURGERY     X 3  . ESOPHAGOGASTRODUODENOSCOPY (EGD) WITH PROPOFOL N/A 10/16/2018   Procedure: ESOPHAGOGASTRODUODENOSCOPY (EGD) WITH PROPOFOL;  Surgeon: Corbin Adeourk, Robert M, MD;  Location: [facility];  Service: Endoscopy;  Laterality: N/A;  . ESOPHAGOGASTRODUODENOSCOPY (EGD) WITH PROPOFOL N/A 10/22/2018   Procedure: ESOPHAGOGASTRODUODENOSCOPY (EGD) WITH PROPOFOL;  Surgeon: Lucretia RoersBridges, Lindsay C, MD;  Location: [facility] ORS;  Service: General;  Laterality: N/A;  . EUS N/A 10/11/2012   Dr. Christella HartiganJacobs: 3.7 cm cystic lesion in body of pancreas with large amount of debris, s/p FNA with reddish, milky fluid, main pancreatic duct normal,   . EUS  12/2015   mild to moderate chronic pancreatitis, pancreatic duct with dilation and intraductal stones, measuring up to 6 mm in diameter, no abnormality in main bile duct, abnormal lymph nodes in perigastric region, largest measuring 12 mm in diameter, s/p fine needle biopsy. Pathology with multiple fragments of lymphoid tissue with granulomatous inflammation, negative GMS and AFB stains,  . PEG PLACEMENT N/A 10/22/2018   Procedure: PERCUTANEOUS ENDOSCOPIC GASTROSTOMY (PEG) PLACEMENT with endoscopy;  Surgeon: Lucretia RoersBridges, Lindsay C, MD;  Location: [facility] ORS;  Service: General;  Laterality: N/A;     OB History   No obstetric history on file.      Home Medications    Prior to Admission medications   Medication Sig Start Date End Date Taking? Authorizing Provider  Calcium Carbonate Antacid (CALCIUM CARBONATE, DOSED IN MG ELEMENTAL CALCIUM,) 1250 MG/5ML SUSP Take 10 mLs (1,000 mg of elemental calcium total) by mouth 3 (three) times daily with meals for 30 days. 10/23/18 11/22/18 Yes Shah, Pratik D, DO  imipramine (TOFRANIL) 10 MG tablet Take 10 mg by mouth at bedtime.   Yes [provider]  Nutritional Supplements (FEEDING SUPPLEMENT, OSMOLITE 1.5 CAL,) LIQD Place 1,000 mLs into feeding tube continuous. 10/23/18  Yes Shah, Pratik D, DO   pantoprazole (PROTONIX) 40 MG tablet Take 1 tablet (40 mg total) by mouth daily for 30 days. 10/24/18 11/23/18 Yes Shah, Pratik D, DO  sertraline (ZOLOFT) 50 MG tablet Take 50 mg by mouth daily.   Yes [provider]  Amino Acids-Protein Hydrolys (FEEDING SUPPLEMENT, PRO-STAT SUGAR FREE 64,) LIQD Take 30 mLs by mouth 2 (two) times daily. 10/23/18   Sherryll Burger, Pratik D, DO  lipase/protease/amylase (CREON) 36000 UNITS CPEP capsule Take 1 capsule (36,000 Units total) by mouth 3 (three) times daily with meals. 10/23/18   Sherryll Burger, Pratik D, DO  lipase/protease/amylase 24000-76000 units CPEP Take 1 capsule (24,000 Units total) by mouth with snacks for 30 days. 10/23/18 11/22/18  Maurilio Lovely D, DO    Family History Family History  Problem Relation Age of Onset  . Depression Mother        living  . Pancreatic cancer Father        PATIENT STATES PROSTATE CANCER. UNCLEAR IF ACTUAL PANCREATIC CANCER  . Colon cancer Neg Hx   . Colon polyps Neg Hx     Social History Social History   Tobacco Use  . Smoking status: Current Some Day Smoker    Packs/day: 1.00    Years: 8.00    Pack years: 8.00    Types: Cigarettes  . Smokeless tobacco: Never Used  . Tobacco comment: several cigarretes a day  Substance Use Topics  . Alcohol use: Yes    Alcohol/week: 3.0 standard drinks    Types: 3 Cans of beer per week    Comment: last drink 06/05/18, hx of ETOH abuse  . Drug use: Not Currently    Types: Marijuana    Comment: last time used 1` month ago. DENIED DRUG USE  on 10/15/2018     Allergies   Doxycycline and Sulfa antibiotics   Review of Systems Review of Systems All systems reviewed and negative, other than as noted in HPI.   Physical Exam Updated Vital Signs BP 120/76   Pulse (!) 131   Temp 98.7 F (37.1 C) (Oral)   Resp (!) 27   Ht  (1.549 m)   Wt 48 kg   LMP 04/12/2013   SpO2 100%   BMI 19.99 kg/m   Physical Exam Vitals signs and nursing note reviewed.  Constitutional:       Appearance: She is well-developed. She is ill-appearing.     Comments: Chronically ill appearing  HENT:     Head: Normocephalic and atraumatic.  Eyes:     General:        Right eye: No discharge.        Left eye: No discharge.     Conjunctiva/sclera: Conjunctivae normal.  Neck:     Musculoskeletal: Neck supple.  Cardiovascular:     Rate and Rhythm: Regular rhythm. Tachycardia present.     Heart sounds: Normal heart sounds. No murmur. No friction rub. No gallop.   Pulmonary:     Effort: Pulmonary effort is normal. No respiratory distress.  Breath sounds: Normal breath sounds.  Abdominal:     General: There is no distension.     Palpations: Abdomen is soft.     Tenderness: There is no abdominal tenderness.     Comments: Abdomen flat. Diffuse tenderness w/o rebound/guarding.  Musculoskeletal:        General: No tenderness.  Skin:    General: Skin is warm and dry.  Neurological:     Mental Status: She is alert.  Psychiatric:        Behavior: Behavior normal.        Thought Content: Thought content normal.      ED Treatments / Results  Labs (all labs ordered are listed, but only abnormal results are displayed) Labs Reviewed  CBC WITH DIFFERENTIAL/PLATELET - Abnormal; Notable for the following components:      Result Value   WBC 15.8 (*)    RBC 3.00 (*)    Hemoglobin 9.2 (*)    HCT 28.8 (*)    Platelets 946 (*)    Neutro Abs 13.5 (*)    Monocytes Absolute 1.3 (*)    Abs Immature Granulocytes 0.18 (*)    All other components within normal limits  COMPREHENSIVE METABOLIC PANEL - Abnormal; Notable for the following components:   Sodium 131 (*)    Chloride 96 (*)    Glucose, Bld 330 (*)    BUN 27 (*)    Creatinine, Ser 0.37 (*)    Albumin 2.4 (*)    AST 57 (*)    ALT 132 (*)    Alkaline Phosphatase 310 (*)    All other components within normal limits  LIPASE, BLOOD - Abnormal; Notable for the following components:   Lipase 803 (*)    All other components within  normal limits  SARS CORONAVIRUS 2 (HOSPITAL ORDER, PERFORMED IN [facility])  URINE CULTURE  CULTURE, BLOOD (ROUTINE X 2)  CULTURE, BLOOD (ROUTINE X 2)  MAGNESIUM  LACTIC ACID, PLASMA  URINALYSIS, ROUTINE W REFLEX MICROSCOPIC  PATHOLOGIST SMEAR REVIEW    EKG EKG Interpretation  Date/Time:  Thursday Nov 01 2018 17:21:38 EDT Ventricular Rate:  149 PR Interval:    QRS Duration: 72 QT Interval:  271 QTC Calculation: 427 R Axis:   63 Text Interpretation:  Sinus tachycardia Consider right atrial enlargement Abnormal R-wave progression, early transition Consider left ventricular hypertrophy Confirmed by Raeford Razor [phone]) on 11/01/2018 5:36:22 PM   Radiology Dg Chest Portable 1 View  Result Date: 11/01/2018 CLINICAL DATA:  Fever EXAM: PORTABLE CHEST 1 VIEW COMPARISON:  Chest x-ray dated 10/09/2014 FINDINGS: Cardiac silhouette is enlarged. There is blunting of the right costophrenic angle with a right basilar airspace opacity. There is no pneumothorax. No large pleural effusion. There may be generalized volume overload. There is nonspecific gaseous distention of the partially visualized stomach. IMPRESSION: 1. Blunting of the right costophrenic angle favored to be secondary to a combination of a trace right-sided effusion and atelectasis. An infiltrate or aspiration is not excluded. 2. Mild cardiomegaly. Electronically Signed   By: Katherine Mantle M.D.   On: 11/01/2018 18:23    Procedures Procedures (including critical care time)  CRITICAL CARE Performed by: Raeford Razor Total critical care time: 35 minutes Critical care time was exclusive of separately billable procedures and treating other patients. Critical care was necessary to treat or prevent imminent or life-threatening deterioration. Critical care was time spent personally by me on the following activities: development of treatment plan with patient and/or surrogate as well  as nursing, discussions with  consultants, evaluation of patient's response to treatment, examination of patient, obtaining history from patient or surrogate, ordering and performing treatments and interventions, ordering and review of laboratory studies, ordering and review of radiographic studies, pulse oximetry and re-evaluation of patient's condition.   Medications Ordered in ED Medications  lactated ringers bolus 1,500 mL (0 mLs Intravenous Stopped 11/01/18 1857)  ceFEPIme (MAXIPIME) 1 g in sodium chloride 0.9 % 100 mL IVPB (0 g Intravenous Stopped 11/01/18 1953)  vancomycin (VANCOCIN) IVPB 1000 mg/200 mL premix (0 mg Intravenous Stopped 11/01/18 1857)  morphine 4 MG/ML injection 4 mg (4 mg Intravenous Given 11/01/18 1812)  ondansetron (ZOFRAN) injection 4 mg (4 mg Intravenous Given 11/01/18 1812)  HYDROmorphone (DILAUDID) injection 0.5 mg (0.5 mg Intravenous Given 11/01/18 2016)  LORazepam (ATIVAN) injection 0.5 mg (0.5 mg Intravenous Given 11/01/18 2016)     Initial Impression / Assessment and Plan / ED Course  I have reviewed the triage vital signs and the nursing notes.  Pertinent labs & imaging results that were available during my care of the patient were reviewed by me and considered in my medical decision making (see chart for details).    48yF with reported fever of 101.8. Afebrile in ED although apparently had ibuprofen shortly before she got here. She is very tachycardic, looks ill and has a leukocytosis. She says she hurts everywhere including her abdomen. LFTs elevated and also increase in lipase. Pseudocysts noted on recent CT. Question if now infected versus other intraabdominal process. Flagyl added to the cefepime/vancomycin which was initially ordered. Externally, g-tube looks good.  Pt will ultimately need admitted. Possiblesurgical consultation. Care signed out to oncoming provider.   Final Clinical Impressions(s) / ED Diagnoses   Final diagnoses:  Sepsis, due to unspecified organism, unspecified  whether acute organ dysfunction present [facility])  Generalized abdominal pain    ED Discharge Orders    None       Raeford Razor, MD 11/01/18 2039

## 2018-11-01 NOTE — H&P (Addendum)
Patient Demographics:    Monique Nguyen, is a 48 y.o. female  MRN: [id number]   DOB - 15-Apr-1971  Admit Date - 11/01/2018  Outpatient Primary MD for the patient is Patient, No Pcp Per   Assessment & Plan:    Principal Problem:   Pseudocyst of pancreas Active Problems:   Depression   Anxiety   Pancreatic cyst   Alcohol-induced chronic pancreatitis (HCC)   Acute on chronic pancreatitis (HCC)   Adult failure to thrive   GERD (gastroesophageal reflux disease)   Chronic pancreatitis (HCC)   Protein-calorie malnutrition, severe  A/p  1)Acute on chronic pancreatitis - In the setting of prior hx of alcoholic pancreatitis. WBC of 15.8 noted -Lipase elevated at 803 (up from 76 couple weeks ago) -CT scan of the abdomen and pelvis with pseudocyst  -GI consult from Dr Darrick Penna pending...  Pt has not followed up with Dr. [name], GI at [facility] for advanced endoscopy and care of her pancreatic pseudocysts Continue Protonix, continue cefepime/Flagyl pending further GI input N.p.o. for now  2) possible aspiration pneumonia --- chest x-ray findings noted, given recurrent emesis cannot exclude aspiration pneumonia ,WBC of 15.8 noted,continue cefepime/Flagyl as above #1 pending blood cultures, bronchodilators as ordered   3) possible UTI--- UA suspicious for UTI ,WBC of 15.8 noted, continue IV cefepime as above pending urine and blood cultures  4)FTT/severe protein caloric malnutrition--- dietitian/nutritionist consult to help with PEG  tube feeding management  5)Depression--stable, continue Zoloft and imipramine  6)history of tobacco and alcohol abuse--- patient states she has not drank alcohol in over a month, give multivitamin including thiamine and folic acid, nicotine patch is ordered    With History of - Reviewed by me  Past Medical History:  Diagnosis Date  . Alcohol abuse   . Anxiety   . Bacterial vaginosis   . Chronic abdominal pain   . Chronic pain   . Depression   . GERD (gastroesophageal reflux disease)   . IBS (irritable bowel syndrome)   . Pancreatitis    ETOH  . Panic attacks   . Psoriasis       Past Surgical History:  Procedure Laterality Date  . BLADDER SURGERY     X 3  . ESOPHAGOGASTRODUODENOSCOPY (EGD) WITH PROPOFOL N/A 10/16/2018   Procedure: ESOPHAGOGASTRODUODENOSCOPY (EGD) WITH PROPOFOL;  Surgeon: Corbin Ade, MD;  Location: [facility];  Service: Endoscopy;  Laterality: N/A;  . ESOPHAGOGASTRODUODENOSCOPY (EGD) WITH PROPOFOL N/A 10/22/2018   Procedure: ESOPHAGOGASTRODUODENOSCOPY (EGD) WITH PROPOFOL;  Surgeon: Lucretia Roers, MD;  Location: [facility] ORS;  Service: General;  Laterality: N/A;  . EUS N/A 10/11/2012   Dr. Christella Hartigan: 3.7 cm cystic lesion in body of pancreas with large amount of debris, s/p FNA with reddish, milky fluid, main pancreatic duct normal,   . EUS  12/2015   mild to moderate chronic pancreatitis, pancreatic duct with dilation and intraductal stones, measuring up to 6 mm in diameter,  no abnormality in main bile duct, abnormal lymph nodes in perigastric region, largest measuring 12 mm in diameter, s/p fine needle biopsy. Pathology with multiple fragments of lymphoid tissue with granulomatous inflammation, negative GMS and AFB stains,  . PEG PLACEMENT N/A 10/22/2018   Procedure: PERCUTANEOUS ENDOSCOPIC GASTROSTOMY (PEG) PLACEMENT with endoscopy;  Surgeon: Lucretia Roers, MD;  Location: [facility] OR[facility];  Service: General;  Laterality: N/A;      Chief Complaint  Patient presents with  . Fever      HPI:    Monique Nguyen  is a 48 y.o. female  with past medical history relevant for GERD , chronic alcoholic pancreatitis with pseudocyst and failure to thrive, s/p  recent PEG tube placement on 10/22/2018 , also has past medical history  relevant for depression/anxiety, as well as psoriasis and tobacco abuse  Now Presents with nausea and abdominal pain, tolerating PEG tube feeding well but unable to take additional intake orally due to her nausea and emesis  Fevers at home , tachycardia here in the hospital with borderline low BP , WBC is up to 15.8 , lipase elevated at 803 (up from 76 couple weeks ago), lactic acid is not elevated  In ED.... CT abd/Pelvis with findings consistent with pancreatic pseudocysts, chest x-ray with possible aspiration pneumonia  UA suggestive of UTI  EDP obtained cultures, gave IV cefepime and vancomycin and tried hospitalist called for admission  Pt was recently dced from this hospital on 10/23/18  ---Pt has not followed up with Dr. [name], GI at [facility] for advanced endoscopy and care of her pancreatic pseudocysts    Review of systems:    In addition to the HPI above,   A full Review of  Systems was done, all other systems reviewed are negative except as noted above in HPI , .    Social History:  Reviewed by me    Social History   Tobacco Use  . Smoking status: Current Some Day Smoker    Packs/day: 1.00    Years: 8.00    Pack years: 8.00    Types: Cigarettes  . Smokeless tobacco: Never Used  . Tobacco comment: several cigarretes a day  Substance Use Topics  . Alcohol use: Yes    Alcohol/week: 3.0 standard drinks    Types: 3 Cans of beer per week    Comment: last drink 06/05/18, hx of ETOH abuse       Family History :  Reviewed by me    Family History  Problem Relation Age of Onset  . Depression Mother        living  . Pancreatic cancer Father        PATIENT STATES PROSTATE CANCER. UNCLEAR IF ACTUAL PANCREATIC CANCER  . Colon cancer Neg Hx   . Colon polyps Neg Hx     Home Medications:   Prior to Admission medications   Medication Sig Start Date End Date Taking? Authorizing Provider  Calcium Carbonate Antacid (CALCIUM CARBONATE, DOSED IN  MG ELEMENTAL CALCIUM,) 1250 MG/5ML SUSP Take 10 mLs (1,000 mg of elemental calcium total) by mouth 3 (three) times daily with meals for 30 days. 10/23/18 11/22/18 Yes Shah, Pratik D, DO  imipramine (TOFRANIL) 10 MG tablet Take 10 mg by mouth at bedtime.   Yes [provider]  Nutritional Supplements (FEEDING SUPPLEMENT, OSMOLITE 1.5 CAL,) LIQD Place 1,000 mLs into feeding tube continuous. 10/23/18  Yes Shah, Pratik D, DO  pantoprazole (PROTONIX) 40 MG tablet Take 1 tablet (40  mg total) by mouth daily for 30 days. 10/24/18 11/23/18 Yes Shah, Pratik D, DO  sertraline (ZOLOFT) 50 MG tablet Take 50 mg by mouth daily.   Yes [provider]  Amino Acids-Protein Hydrolys (FEEDING SUPPLEMENT, PRO-STAT SUGAR FREE 64,) LIQD Take 30 mLs by mouth 2 (two) times daily. 10/23/18   Sherryll Burger, Pratik D, DO  lipase/protease/amylase (CREON) 36000 UNITS CPEP capsule Take 1 capsule (36,000 Units total) by mouth 3 (three) times daily with meals. 10/23/18   Sherryll Burger, Pratik D, DO  lipase/protease/amylase 24000-76000 units CPEP Take 1 capsule (24,000 Units total) by mouth with snacks for 30 days. 10/23/18 11/22/18  Maurilio Lovely D, DO     Allergies:     Allergies  Allergen Reactions  . Doxycycline Other (See Comments)    headaches  . Sulfa Antibiotics Nausea And Vomiting and Other (See Comments)    Blurred vision and headaches     Physical Exam:   Vitals  Blood pressure 119/75, pulse (!) 122, temperature 98.4 F (36.9 C), temperature source Oral, resp. rate 15, height 5\' 1"  (1.549 m), weight 44.5 kg, last menstrual period 04/12/2013, SpO2 100 %.  Physical Examination: General appearance - alert,cachectic appearing (looks older than stated age), and in no distress  Mental status - alert, oriented to person, place, and time,  Eyes - sclera anicteric Neck - supple, no JVD elevation , Chest -diminished in bases, no wheezing  heart - S1 and S2 normal, regular  Abdomen - soft, nondistended, PEG tube site is clean  dry and intact, generalized abdominal discomfort without rebound or guarding Neurological - screening mental status exam normal, neck supple without rigidity, cranial nerves II through XII intact, DTR's normal and symmetric Extremities - no pedal edema noted, intact peripheral pulses  Skin - warm, dry     Data Review:    CBC Recent Labs  Lab 11/01/18 1737  WBC 15.8*  HGB 9.2*  HCT 28.8*  PLT 946*  MCV 96.0  MCH 30.7  MCHC 31.9  RDW 15.4  LYMPHSABS 0.7  MONOABS 1.3*  EOSABS 0.0  BASOSABS 0.0   ------------------------------------------------------------------------------------------------------------------  Chemistries  Recent Labs  Lab 11/01/18 1737  NA 131*  K 3.8  CL 96*  CO2 24  GLUCOSE 330*  BUN 27*  CREATININE 0.37*  CALCIUM 9.3  MG 1.8  AST 57*  ALT 132*  ALKPHOS 310*  BILITOT 0.6   ------------------------------------------------------------------------------------------------------------------ estimated creatinine clearance is 60.4 mL/min (A) (by C-G formula based on SCr of 0.37 mg/dL (L)). ------------------------------------------------------------------------------------------------------------------ No results for input(s): TSH, T4TOTAL, T3FREE, THYROIDAB in the last 72 hours.  Invalid input(s): FREET3   Coagulation profile No results for input(s): INR, PROTIME in the last 168 hours. ------------------------------------------------------------------------------------------------------------------- No results for input(s): DDIMER in the last 72 hours. -------------------------------------------------------------------------------------------------------------------  Cardiac Enzymes No results for input(s): CKMB, TROPONINI, MYOGLOBIN in the last 168 hours.  Invalid input(s): CK ------------------------------------------------------------------------------------------------------------------ No results found for: BNP    ---------------------------------------------------------------------------------------------------------------  Urinalysis    Component Value Date/Time   COLORURINE AMBER (A) 11/01/2018 2315   APPEARANCEUR CLOUDY (A) 11/01/2018 2315   LABSPEC 1.036 (H) 11/01/2018 2315   PHURINE 8.0 11/01/2018 2315   GLUCOSEU >=500 (A) 11/01/2018 2315   HGBUR NEGATIVE 11/01/2018 2315   BILIRUBINUR NEGATIVE 11/01/2018 2315   KETONESUR NEGATIVE 11/01/2018 2315   PROTEINUR 100 (A) 11/01/2018 2315   UROBILINOGEN 0.2 03/06/2015 1558   NITRITE NEGATIVE 11/01/2018 2315   LEUKOCYTESUR NEGATIVE 11/01/2018 2315    ----------------------------------------------------------------------------------------------------------------   Imaging Results:    Ct  Abdomen Pelvis W Contrast  Result Date: 11/01/2018 CLINICAL DATA:  Generalized abdominal pain EXAM: CT ABDOMEN AND PELVIS WITH CONTRAST TECHNIQUE: Multidetector CT imaging of the abdomen and pelvis was performed using the standard protocol following bolus administration of intravenous contrast. CONTRAST:  100mL OMNIPAQUE IOHEXOL 300 MG/ML  SOLN COMPARISON:  CT abdomen pelvis 10/14/2018 FINDINGS: LOWER CHEST: Small pleural effusions and bibasilar atelectasis. HEPATOBILIARY: The hepatic contours and density are normal. There is no intra- or extrahepatic biliary dilatation. The gallbladder is normal. PANCREAS: There are multiple peripancreatic cystic lesions. The most superior, near the gastro esophageal junction, now measures 1.7 cm, previously 2.5 cm. Collection along the anterior pancreatic body measures 3.3 x 3.2 cm, previously 2.4 x 1.8 cm. Collection of the pancreatic head measures 2.7 x 2.0 cm, previously 4.0 x 2.9 cm. Chronic atrophy of the pancreatic tail and distal body. SPLEEN: Normal. ADRENALS/URINARY TRACT: --Adrenal glands: Normal. --Right kidney/ureter: No hydronephrosis, nephroureterolithiasis, perinephric stranding or solid renal mass. --Left kidney/ureter:  No hydronephrosis, nephroureterolithiasis, perinephric stranding or solid renal mass. --Urinary bladder: Markedly dilated. STOMACH/BOWEL: --Stomach/Duodenum: Gastrostomy tube is present within the stomach. --Small bowel: No dilatation or inflammation. --Colon: The cecum is markedly stool distended. --Appendix: Normal. VASCULAR/LYMPHATIC: Normal course and caliber of the major abdominal vessels. No abdominal or pelvic lymphadenopathy. REPRODUCTIVE: Normal uterus and ovaries. Small amount of free fluid in the pelvis. MUSCULOSKELETAL. No bony spinal canal stenosis or focal osseous abnormality. OTHER: None. IMPRESSION: 1. Multiple upper abdominal fluid collections, including collections in the pancreatic head and body and at the gastroesophageal junction, with mixed changes in size; the pancreatic body collection showing the greatest growth and the pancreatic head collection showing the greatest decrease. These collections are most consistent with pancreatic pseudocysts. No specific findings of infection, though this would be difficult to entirely exclude. 2. Distended and stool-filled cecum. 3. Markedly distended urinary bladder without hydronephrosis. 4. Decreased density of fluid in the posterior cul-de-sac, likely resolving hemoperitoneum. Electronically Signed   By: Deatra RobinsonKevin  Herman M.D.   On: 11/01/2018 22:05   Dg Chest Portable 1 View  Result Date: 11/01/2018 CLINICAL DATA:  Fever EXAM: PORTABLE CHEST 1 VIEW COMPARISON:  Chest x-ray dated 10/09/2014 FINDINGS: Cardiac silhouette is enlarged. There is blunting of the right costophrenic angle with a right basilar airspace opacity. There is no pneumothorax. No large pleural effusion. There may be generalized volume overload. There is nonspecific gaseous distention of the partially visualized stomach. IMPRESSION: 1. Blunting of the right costophrenic angle favored to be secondary to a combination of a trace right-sided effusion and atelectasis. An infiltrate or  aspiration is not excluded. 2. Mild cardiomegaly. Electronically Signed   By: Katherine Mantlehristopher  Green M.D.   On: 11/01/2018 18:23    Radiological Exams on Admission: Ct Abdomen Pelvis W Contrast  Result Date: 11/01/2018 CLINICAL DATA:  Generalized abdominal pain EXAM: CT ABDOMEN AND PELVIS WITH CONTRAST TECHNIQUE: Multidetector CT imaging of the abdomen and pelvis was performed using the standard protocol following bolus administration of intravenous contrast. CONTRAST:  100mL OMNIPAQUE IOHEXOL 300 MG/ML  SOLN COMPARISON:  CT abdomen pelvis 10/14/2018 FINDINGS: LOWER CHEST: Small pleural effusions and bibasilar atelectasis. HEPATOBILIARY: The hepatic contours and density are normal. There is no intra- or extrahepatic biliary dilatation. The gallbladder is normal. PANCREAS: There are multiple peripancreatic cystic lesions. The most superior, near the gastro esophageal junction, now measures 1.7 cm, previously 2.5 cm. Collection along the anterior pancreatic body measures 3.3 x 3.2 cm, previously 2.4 x 1.8 cm. Collection of the pancreatic head  measures 2.7 x 2.0 cm, previously 4.0 x 2.9 cm. Chronic atrophy of the pancreatic tail and distal body. SPLEEN: Normal. ADRENALS/URINARY TRACT: --Adrenal glands: Normal. --Right kidney/ureter: No hydronephrosis, nephroureterolithiasis, perinephric stranding or solid renal mass. --Left kidney/ureter: No hydronephrosis, nephroureterolithiasis, perinephric stranding or solid renal mass. --Urinary bladder: Markedly dilated. STOMACH/BOWEL: --Stomach/Duodenum: Gastrostomy tube is present within the stomach. --Small bowel: No dilatation or inflammation. --Colon: The cecum is markedly stool distended. --Appendix: Normal. VASCULAR/LYMPHATIC: Normal course and caliber of the major abdominal vessels. No abdominal or pelvic lymphadenopathy. REPRODUCTIVE: Normal uterus and ovaries. Small amount of free fluid in the pelvis. MUSCULOSKELETAL. No bony spinal canal stenosis or focal osseous  abnormality. OTHER: None. IMPRESSION: 1. Multiple upper abdominal fluid collections, including collections in the pancreatic head and body and at the gastroesophageal junction, with mixed changes in size; the pancreatic body collection showing the greatest growth and the pancreatic head collection showing the greatest decrease. These collections are most consistent with pancreatic pseudocysts. No specific findings of infection, though this would be difficult to entirely exclude. 2. Distended and stool-filled cecum. 3. Markedly distended urinary bladder without hydronephrosis. 4. Decreased density of fluid in the posterior cul-de-sac, likely resolving hemoperitoneum. Electronically Signed   By: Deatra Robinson M.D.   On: 11/01/2018 22:05   Dg Chest Portable 1 View  Result Date: 11/01/2018 CLINICAL DATA:  Fever EXAM: PORTABLE CHEST 1 VIEW COMPARISON:  Chest x-ray dated 10/09/2014 FINDINGS: Cardiac silhouette is enlarged. There is blunting of the right costophrenic angle with a right basilar airspace opacity. There is no pneumothorax. No large pleural effusion. There may be generalized volume overload. There is nonspecific gaseous distention of the partially visualized stomach. IMPRESSION: 1. Blunting of the right costophrenic angle favored to be secondary to a combination of a trace right-sided effusion and atelectasis. An infiltrate or aspiration is not excluded. 2. Mild cardiomegaly. Electronically Signed   By: Katherine Mantle M.D.   On: 11/01/2018 18:23    DVT Prophylaxis -SCD /Heparin AM Labs Ordered, also please review Full Orders  Family Communication: Admission, patients condition and plan of care including tests being ordered have been discussed with the patient who indicate understanding and agree with the plan   Code Status - Full Code  Likely DC to  home  Condition   stable  Shon Hale M.D on 11/02/2018 at 2:57 AM Go to [email or web address] -  for contact info  [facility] - Office   [phone]

## 2018-11-01 NOTE — ED Notes (Signed)
Patient states that she feels like she can not tolerate 2nd bottle of contrast. Patient states nausea.

## 2018-11-01 NOTE — ED Provider Notes (Signed)
Discussed clinical scenario with Dr. Raj Janus gastroenterologist.  GI will consult in the morning.   Donnetta Hutching, MD 11/01/18 2240

## 2018-11-01 NOTE — ED Notes (Signed)
Patient due for CT at 21:30.

## 2018-11-01 NOTE — ED Triage Notes (Signed)
Pt from home sent in by [facility] RN for evaluation of fever of 101.8, HR 152, RR 28 RR. Pt d/c from [facility] 2 days ago after peg tube was placed. HH RN was at home to teach pt and caregiver about caring for peg tube. Pt c/o generalized weakness, nausea, and cannot tolerate water flushes. Pt pale. HH RN stated peg site appeared WDL. Pt took Motrin about 3 hrs ago.

## 2018-11-01 NOTE — ED Notes (Signed)
Patient transported to CT 

## 2018-11-01 NOTE — ED Notes (Signed)
CRITICAL VALUE ALERT  Critical Value:  Platelets 946  Date & Time Notied:  11/01/2018 1830   Provider Notified: Dr. Juleen China  Orders Received/Actions taken: None yet

## 2018-11-01 NOTE — ED Notes (Signed)
Pt would like something for nerves. Will notify Dr. Juleen China

## 2018-11-01 NOTE — ED Notes (Signed)
One bottle of (oral contrast) given through peg tube per Dr. Juleen China for CT scan at 19:30.

## 2018-11-01 NOTE — ED Notes (Signed)
Have paged [facility] for Cefepime  

## 2018-11-01 NOTE — ED Notes (Signed)
Have paged [facility] for Cefepime

## 2018-11-02 ENCOUNTER — Encounter (HOSPITAL_COMMUNITY): Payer: Self-pay

## 2018-11-02 ENCOUNTER — Other Ambulatory Visit: Payer: Self-pay

## 2018-11-02 DIAGNOSIS — R1084 Generalized abdominal pain: Secondary | ICD-10-CM

## 2018-11-02 DIAGNOSIS — K860 Alcohol-induced chronic pancreatitis: Secondary | ICD-10-CM

## 2018-11-02 DIAGNOSIS — K862 Cyst of pancreas: Secondary | ICD-10-CM

## 2018-11-02 DIAGNOSIS — K859 Acute pancreatitis without necrosis or infection, unspecified: Secondary | ICD-10-CM

## 2018-11-02 DIAGNOSIS — R627 Adult failure to thrive: Secondary | ICD-10-CM

## 2018-11-02 DIAGNOSIS — K861 Other chronic pancreatitis: Secondary | ICD-10-CM

## 2018-11-02 DIAGNOSIS — E43 Unspecified severe protein-calorie malnutrition: Secondary | ICD-10-CM

## 2018-11-02 DIAGNOSIS — L8990 Pressure ulcer of unspecified site, unspecified stage: Secondary | ICD-10-CM

## 2018-11-02 LAB — CBC
HCT: 24.9 % — ABNORMAL LOW (ref 36.0–46.0)
Hemoglobin: 7.5 g/dL — ABNORMAL LOW (ref 12.0–15.0)
MCH: 30 pg (ref 26.0–34.0)
MCHC: 30.1 g/dL (ref 30.0–36.0)
MCV: 99.6 fL (ref 80.0–100.0)
Platelets: 612 10*3/uL — ABNORMAL HIGH (ref 150–400)
RBC: 2.5 MIL/uL — ABNORMAL LOW (ref 3.87–5.11)
RDW: 15.1 % (ref 11.5–15.5)
WBC: 8.6 10*3/uL (ref 4.0–10.5)
nRBC: 0 % (ref 0.0–0.2)

## 2018-11-02 LAB — COMPREHENSIVE METABOLIC PANEL
ALT: 86 U/L — ABNORMAL HIGH (ref 0–44)
AST: 25 U/L (ref 15–41)
Albumin: 1.9 g/dL — ABNORMAL LOW (ref 3.5–5.0)
Alkaline Phosphatase: 212 U/L — ABNORMAL HIGH (ref 38–126)
Anion gap: 9 (ref 5–15)
BUN: 24 mg/dL — ABNORMAL HIGH (ref 6–20)
CO2: 25 mmol/L (ref 22–32)
Calcium: 8.5 mg/dL — ABNORMAL LOW (ref 8.9–10.3)
Chloride: 103 mmol/L (ref 98–111)
Creatinine, Ser: <0.3 mg/dL — ABNORMAL LOW (ref 0.44–1.00)
Glucose, Bld: 115 mg/dL — ABNORMAL HIGH (ref 70–99)
Potassium: 3.6 mmol/L (ref 3.5–5.1)
Sodium: 137 mmol/L (ref 135–145)
Total Bilirubin: 0.7 mg/dL (ref 0.3–1.2)
Total Protein: 5.4 g/dL — ABNORMAL LOW (ref 6.5–8.1)

## 2018-11-02 LAB — PATHOLOGIST SMEAR REVIEW

## 2018-11-02 MED ORDER — ACETAMINOPHEN 160 MG/5ML PO SOLN: 650.0000 mg | Freq: Four times a day (QID) | ORAL | Status: DC | PRN

## 2018-11-02 MED ORDER — HYDROMORPHONE HCL 1 MG/ML PO LIQD: 1.0000 mg | ORAL | Status: DC | PRN

## 2018-11-02 MED ORDER — ACETAMINOPHEN 650 MG RE SUPP: 650.0000 mg | Freq: Four times a day (QID) | RECTAL | Status: DC | PRN

## 2018-11-02 MED ORDER — FOLIC ACID 1 MG PO TABS
1.0000 mg | ORAL_TABLET | Freq: Every day | ORAL | Status: DC
  Administered 2018-11-03 – 2018-11-07 (×4): 1 mg
  Filled 2018-11-02 (×4): qty 1

## 2018-11-02 MED ORDER — ORAL CARE MOUTH RINSE
15.0000 mL | Freq: Two times a day (BID) | OROMUCOSAL | Status: DC
  Administered 2018-11-02 – 2018-11-06 (×7): 15 mL via OROMUCOSAL

## 2018-11-02 MED ORDER — SERTRALINE HCL 50 MG PO TABS
50.0000 mg | ORAL_TABLET | Freq: Every day | ORAL | Status: DC
  Administered 2018-11-02 – 2018-11-07 (×5): 50 mg
  Filled 2018-11-02 (×5): qty 1

## 2018-11-02 MED ORDER — SODIUM CHLORIDE 0.9 % IV BOLUS
1000.0000 mL | Freq: Once | INTRAVENOUS | Status: AC
  Administered 2018-11-02: 1000 mL via INTRAVENOUS

## 2018-11-02 MED ORDER — IMIPRAMINE HCL 10 MG PO TABS: 10.0000 mg | ORAL_TABLET | Freq: Every day | ORAL | Status: DC

## 2018-11-02 MED ORDER — ONDANSETRON HCL 4 MG PO TABS: 4.0000 mg | ORAL_TABLET | Freq: Four times a day (QID) | ORAL | Status: DC | PRN

## 2018-11-02 MED ORDER — CALCIUM CARBONATE ANTACID 1250 MG/5ML PO SUSP
1000.0000 mg | Freq: Three times a day (TID) | ORAL | Status: DC
  Administered 2018-11-02 – 2018-11-07 (×11): 1000 mg
  Filled 2018-11-02 (×23): qty 10

## 2018-11-02 MED ORDER — POLYETHYLENE GLYCOL 3350 17 G PO PACK
17.0000 g | PACK | ORAL | Status: AC
  Administered 2018-11-02 (×2): 17 g
  Filled 2018-11-02 (×2): qty 1

## 2018-11-02 MED ORDER — HYDROXYZINE HCL 25 MG PO TABS
25.0000 mg | ORAL_TABLET | Freq: Three times a day (TID) | ORAL | Status: DC | PRN
  Administered 2018-11-02 – 2018-11-07 (×6): 25 mg via ORAL
  Filled 2018-11-02 (×6): qty 1

## 2018-11-02 MED ORDER — POTASSIUM CHLORIDE 2 MEQ/ML IV SOLN
INTRAVENOUS | Status: DC
  Administered 2018-11-02 – 2018-11-05 (×6): via INTRAVENOUS
  Filled 2018-11-02 (×13): qty 1000

## 2018-11-02 MED ORDER — HYDROMORPHONE HCL 1 MG/ML IJ SOLN
1.0000 mg | INTRAMUSCULAR | Status: DC | PRN
  Administered 2018-11-02 – 2018-11-07 (×16): 1 mg via INTRAVENOUS
  Filled 2018-11-02 (×17): qty 1

## 2018-11-02 MED ORDER — PANTOPRAZOLE SODIUM 40 MG PO PACK
40.0000 mg | PACK | Freq: Two times a day (BID) | ORAL | Status: DC
  Administered 2018-11-02 – 2018-11-07 (×7): 40 mg
  Filled 2018-11-02 (×10): qty 20

## 2018-11-02 MED ORDER — PANCRELIPASE (LIP-PROT-AMYL) 12000-38000 UNITS PO CPEP
36000.0000 [IU] | ORAL_CAPSULE | Freq: Three times a day (TID) | ORAL | Status: DC
  Filled 2018-11-02: qty 3

## 2018-11-02 MED ORDER — OXYCODONE HCL 5 MG PO TABS: 5.0000 mg | ORAL_TABLET | ORAL | Status: DC | PRN

## 2018-11-02 MED ORDER — NICOTINE 14 MG/24HR TD PT24
14.0000 mg | MEDICATED_PATCH | Freq: Every day | TRANSDERMAL | Status: DC
  Administered 2018-11-02 – 2018-11-07 (×7): 14 mg via TRANSDERMAL
  Filled 2018-11-02 (×7): qty 1

## 2018-11-02 MED ORDER — CALCIUM CARBONATE ANTACID 1250 MG/5ML PO SUSP
1000.0000 mg | Freq: Three times a day (TID) | ORAL | Status: DC
  Filled 2018-11-02 (×2): qty 10

## 2018-11-02 MED ORDER — TRAZODONE HCL 50 MG PO TABS: 50.0000 mg | ORAL_TABLET | Freq: Every evening | ORAL | Status: DC | PRN

## 2018-11-02 MED ORDER — PANCRELIPASE (LIP-PROT-AMYL) 12000-38000 UNITS PO CPEP
72000.0000 [IU] | ORAL_CAPSULE | Freq: Three times a day (TID) | ORAL | Status: DC
  Administered 2018-11-02: 72000 [IU] via ORAL
  Filled 2018-11-02: qty 6

## 2018-11-02 MED ORDER — MORPHINE SULFATE (PF) 2 MG/ML IV SOLN
2.0000 mg | INTRAVENOUS | Status: DC | PRN
  Administered 2018-11-02: 2 mg via INTRAVENOUS
  Filled 2018-11-02: qty 1

## 2018-11-02 MED ORDER — PRO-STAT SUGAR FREE PO LIQD
30.0000 mL | Freq: Every day | ORAL | Status: DC
  Administered 2018-11-03 – 2018-11-07 (×4): 30 mL
  Filled 2018-11-02 (×4): qty 30

## 2018-11-02 MED ORDER — SODIUM CHLORIDE 0.9 % IV SOLN: 250.0000 mL | INTRAVENOUS | Status: DC | PRN

## 2018-11-02 MED ORDER — METRONIDAZOLE IN NACL 5-0.79 MG/ML-% IV SOLN
500.0000 mg | Freq: Three times a day (TID) | INTRAVENOUS | Status: AC
  Administered 2018-11-02 – 2018-11-06 (×14): 500 mg via INTRAVENOUS
  Filled 2018-11-02 (×16): qty 100

## 2018-11-02 MED ORDER — PANTOPRAZOLE SODIUM 40 MG PO TBEC: 40.0000 mg | DELAYED_RELEASE_TABLET | Freq: Every day | ORAL | Status: DC

## 2018-11-02 MED ORDER — OSMOLITE 1.5 CAL PO LIQD
1000.0000 mL | ORAL | Status: DC
  Administered 2018-11-05 – 2018-11-07 (×2): 1000 mL
  Filled 2018-11-02 (×6): qty 1000

## 2018-11-02 MED ORDER — SODIUM CHLORIDE 0.9% FLUSH
3.0000 mL | Freq: Two times a day (BID) | INTRAVENOUS | Status: DC
  Administered 2018-11-03 – 2018-11-04 (×2): 3 mL via INTRAVENOUS

## 2018-11-02 MED ORDER — ALBUTEROL SULFATE (2.5 MG/3ML) 0.083% IN NEBU: 2.5000 mg | INHALATION_SOLUTION | RESPIRATORY_TRACT | Status: DC | PRN

## 2018-11-02 MED ORDER — HEPARIN SODIUM (PORCINE) 5000 UNIT/ML IJ SOLN
5000.0000 [IU] | Freq: Three times a day (TID) | INTRAMUSCULAR | Status: DC
  Administered 2018-11-02 – 2018-11-07 (×13): 5000 [IU] via SUBCUTANEOUS
  Filled 2018-11-02 (×13): qty 1

## 2018-11-02 MED ORDER — VITAMIN B-1 100 MG PO TABS
100.0000 mg | ORAL_TABLET | Freq: Every day | ORAL | Status: DC
  Administered 2018-11-03 – 2018-11-07 (×4): 100 mg
  Filled 2018-11-02 (×4): qty 1

## 2018-11-02 MED ORDER — POLYETHYLENE GLYCOL 3350 17 G PO PACK: 17.0000 g | PACK | Freq: Every day | ORAL | Status: DC | PRN

## 2018-11-02 MED ORDER — PANTOPRAZOLE SODIUM 40 MG PO TBEC: 40.0000 mg | DELAYED_RELEASE_TABLET | Freq: Two times a day (BID) | ORAL | Status: DC

## 2018-11-02 MED ORDER — VITAMIN B-1 100 MG PO TABS
100.0000 mg | ORAL_TABLET | Freq: Every day | ORAL | Status: DC
  Administered 2018-11-02: 100 mg via ORAL
  Filled 2018-11-02: qty 1

## 2018-11-02 MED ORDER — METOPROLOL TARTRATE 5 MG/5ML IV SOLN
5.0000 mg | Freq: Once | INTRAVENOUS | Status: AC
  Administered 2018-11-02: 5 mg via INTRAVENOUS
  Filled 2018-11-02: qty 5

## 2018-11-02 MED ORDER — OXYCODONE HCL 5 MG/5ML PO SOLN: 5.0000 mg | ORAL | Status: DC | PRN

## 2018-11-02 MED ORDER — MORPHINE SULFATE (PF) 2 MG/ML IV SOLN
2.0000 mg | INTRAVENOUS | Status: DC | PRN
  Filled 2018-11-02: qty 1

## 2018-11-02 MED ORDER — MORPHINE SULFATE (PF) 2 MG/ML IV SOLN
2.0000 mg | Freq: Once | INTRAVENOUS | Status: AC
  Administered 2018-11-02: 06:00:00 2 mg via INTRAVENOUS
  Filled 2018-11-02: qty 1

## 2018-11-02 MED ORDER — ONDANSETRON HCL 4 MG/2ML IJ SOLN
4.0000 mg | Freq: Four times a day (QID) | INTRAMUSCULAR | Status: DC | PRN
  Administered 2018-11-02 – 2018-11-04 (×6): 4 mg via INTRAVENOUS
  Filled 2018-11-02 (×7): qty 2

## 2018-11-02 MED ORDER — FOLIC ACID 1 MG PO TABS
1.0000 mg | ORAL_TABLET | Freq: Every day | ORAL | Status: DC
  Administered 2018-11-02: 1 mg via ORAL
  Filled 2018-11-02: qty 1

## 2018-11-02 MED ORDER — OXYCODONE HCL 5 MG/5ML PO SOLN
5.0000 mg | ORAL | Status: DC | PRN
  Administered 2018-11-02 – 2018-11-06 (×2): 5 mg
  Filled 2018-11-02 (×2): qty 5

## 2018-11-02 MED ORDER — HYDROMORPHONE HCL 1 MG/ML IJ SOLN
1.0000 mg | Freq: Four times a day (QID) | INTRAMUSCULAR | Status: DC
  Administered 2018-11-02 – 2018-11-03 (×7): 1 mg via INTRAVENOUS
  Filled 2018-11-02 (×6): qty 1

## 2018-11-02 MED ORDER — SODIUM CHLORIDE 0.9 % IV SOLN
2.0000 g | Freq: Two times a day (BID) | INTRAVENOUS | Status: DC
  Administered 2018-11-02 – 2018-11-03 (×3): 2 g via INTRAVENOUS
  Filled 2018-11-02 (×3): qty 2

## 2018-11-02 MED ORDER — OSMOLITE 1.5 CAL PO LIQD
1000.0000 mL | ORAL | Status: DC
  Administered 2018-11-02: 1000 mL
  Filled 2018-11-02 (×3): qty 1000

## 2018-11-02 MED ORDER — ACETAMINOPHEN 325 MG PO TABS: 650.0000 mg | ORAL_TABLET | Freq: Four times a day (QID) | ORAL | Status: DC | PRN

## 2018-11-02 MED ORDER — SERTRALINE HCL 50 MG PO TABS: 50.0000 mg | ORAL_TABLET | Freq: Every day | ORAL | Status: DC

## 2018-11-02 MED ORDER — PRO-STAT SUGAR FREE PO LIQD
30.0000 mL | Freq: Two times a day (BID) | ORAL | Status: DC
  Administered 2018-11-02: 10:00:00 30 mL
  Filled 2018-11-02: qty 30

## 2018-11-02 MED ORDER — KCL IN DEXTROSE-NACL 10-5-0.45 MEQ/L-%-% IV SOLN
INTRAVENOUS | Status: DC
  Administered 2018-11-02: 05:00:00 via INTRAVENOUS
  Filled 2018-11-02 (×8): qty 1000

## 2018-11-02 MED ORDER — PRO-STAT SUGAR FREE PO LIQD
30.0000 mL | Freq: Two times a day (BID) | ORAL | Status: DC
  Administered 2018-11-02: 03:00:00 30 mL via ORAL
  Filled 2018-11-02: qty 30

## 2018-11-02 MED ORDER — IMIPRAMINE HCL 10 MG PO TABS
10.0000 mg | ORAL_TABLET | Freq: Every day | ORAL | Status: DC
  Administered 2018-11-02 – 2018-11-06 (×4): 10 mg
  Filled 2018-11-02 (×7): qty 1

## 2018-11-02 MED ORDER — SODIUM CHLORIDE 0.9% FLUSH: 3.0000 mL | INTRAVENOUS | Status: DC | PRN

## 2018-11-02 NOTE — Progress Notes (Signed)
Patient stated that morphine is not helping pain control  Patient is yelling out and that dilaudid that she had down stairs in the ED helped. Patient asked if she could have dilaudid. Paged MD to make aware. No new orders at this time.

## 2018-11-02 NOTE — Consult Note (Signed)
Referring Provider: DR. Mariea Clonts Primary Care Physician:  Patient, No Pcp Per Primary Gastroenterologist:  [name]  Reason for Consultation: ABDOMINAL PAIN/PANCREATITIS  Impression: ADMITTED WITH WORSENING ABDOMINAL PAIN. HH NURSE SENT PT TO ED FOR EVALUATION DUE TO TEMP 101F. WHICH REVEALED ACUTE ON CHRONIC PANCREATITIS, CONSTIPATION, AND URINARY RETENTION. PAIN NOT WELL CONTROLLED.  Plan: 1. CHECK PVR. 2. DILAUDID 1 MG Q6H ATC AND 1 MG IV Q4H PRN 3. MIRALAX Q4H X 4 DOSES. REASSESS IN AM.  4. CHANGE MIVFs TO LR WITH KCL 10 mEq @ 100 CC/HR 5. INCREASE CREON TO 72K WITH MEALS OR TID. 6. PROTONIX BID.    HPI:  PT DISCHARGED MAY 12 AND REPORTS HER PAIN WAS 9/10.  SHE WAS SENT HOME WITH "JUST 10 PAIN PILLS". SHE DOES NOT EAT ANYTHING AT HOME. HAS TROUBLE SWALLOWING HER PILLS. PT REPORTS SHE IS HAVING GENERALIZED ABDOMINAL PAIN AND BURNING CHEST PAIN. THE ABDOMINAL PAIN CAN RADIATE TO HER BACK. SHE SAYS HER BMS ARE OK AT HOME: NL/HARD/LOOSE, BUT NO DIARRHEA. LAST BM: TUES. SHE HAS SOME HEARTBURN. SOB INCREASED SINCE SHE LEFT THE HOSPITAL. HAS NAUSEA AND RARE VOMITING(NO BLOOD).   PT DENIES HEMATOCHEZIA, HEMATEMESIS, melena, diarrhea, CHANGE IN BOWEL IN HABITS, OR constipation.  Past Medical History:  Diagnosis Date  . Alcohol abuse   . Anxiety   . Bacterial vaginosis   . Chronic abdominal pain   . Chronic pain   . Depression   . GERD (gastroesophageal reflux disease)   . IBS (irritable bowel syndrome)   . Pancreatitis    ETOH  . Panic attacks   . Psoriasis     Past Surgical History:  Procedure Laterality Date  . BLADDER SURGERY     X 3  . ESOPHAGOGASTRODUODENOSCOPY (EGD) WITH PROPOFOL N/A 10/16/2018   Procedure: ESOPHAGOGASTRODUODENOSCOPY (EGD) WITH PROPOFOL;  Surgeon: Corbin Ade, MD;  Location: [facility];  Service: Endoscopy;  Laterality: N/A;  . ESOPHAGOGASTRODUODENOSCOPY (EGD) WITH PROPOFOL N/A 10/22/2018   Procedure: ESOPHAGOGASTRODUODENOSCOPY (EGD) WITH  PROPOFOL;  Surgeon: Lucretia Roers, MD;  Location: [facility];  Service: General;  Laterality: N/A;  . EUS N/A 10/11/2012   Dr. Christella Hartigan: 3.7 cm cystic lesion in body of pancreas with large amount of debris, s/p FNA with reddish, milky fluid, main pancreatic duct normal,   . EUS  12/2015   mild to moderate chronic pancreatitis, pancreatic duct with dilation and intraductal stones, measuring up to 6 mm in diameter, no abnormality in main bile duct, abnormal lymph nodes in perigastric region, largest measuring 12 mm in diameter, s/p fine needle biopsy. Pathology with multiple fragments of lymphoid tissue with granulomatous inflammation, negative GMS and AFB stains,  . PEG PLACEMENT N/A 10/22/2018   Procedure: PERCUTANEOUS ENDOSCOPIC GASTROSTOMY (PEG) PLACEMENT with endoscopy;  Surgeon: Lucretia Roers, MD;  Location: [facility];  Service: General;  Laterality: N/A;    Prior to Admission medications   Medication Sig Start Date End Date Taking? Authorizing Provider  Calcium Carbonate Antacid (CALCIUM CARBONATE, DOSED IN MG ELEMENTAL CALCIUM,) 1250 MG/5ML SUSP Take 10 mLs (1,000 mg of elemental calcium total) by mouth 3 (three) times daily with meals for 30 days. 10/23/18 11/22/18 Yes Shah, Pratik D, DO  imipramine (TOFRANIL) 10 MG tablet Take 10 mg by mouth at bedtime.   Yes [provider]  Nutritional Supplements (FEEDING SUPPLEMENT, OSMOLITE 1.5 CAL,) LIQD Place 1,000 mLs into feeding tube continuous. 10/23/18  Yes Shah, Pratik D, DO  pantoprazole (PROTONIX) 40 MG tablet Take 1 tablet (40  mg total) by mouth daily for 30 days. 10/24/18 11/23/18 Yes Shah, Pratik D, DO  sertraline (ZOLOFT) 50 MG tablet Take 50 mg by mouth daily.   Yes [provider]  Amino Acids-Protein Hydrolys (FEEDING SUPPLEMENT, PRO-STAT SUGAR FREE 64,) LIQD Take 30 mLs by mouth 2 (two) times daily. 10/23/18   Sherryll Burger, Pratik D, DO  lipase/protease/amylase (CREON) 36000 UNITS CPEP capsule Take 1 capsule (36,000 Units total) by  mouth 3 (three) times daily with meals. 10/23/18   Sherryll Burger, Pratik D, DO  lipase/protease/amylase 24000-76000 units CPEP Take 1 capsule (24,000 Units total) by mouth with snacks for 30 days. 10/23/18 11/22/18  Maurilio Lovely D, DO    Current Facility-Administered Medications  Medication Dose Route Frequency Provider Last Rate Last Dose  . 0.9 %  sodium chloride infusion  250 mL Intravenous PRN Emokpae, Courage, MD      . acetaminophen (TYLENOL) solution 650 mg  650 mg Per Tube Q6H PRN Emokpae, Courage, MD       Or  . acetaminophen (TYLENOL) suppository 650 mg  650 mg Rectal Q6H PRN Emokpae, Courage, MD      . albuterol (PROVENTIL) (2.5 MG/3ML) 0.083% nebulizer solution 2.5 mg  2.5 mg Nebulization Q2H PRN Emokpae, Courage, MD      . calcium carbonate (dosed in mg elemental calcium) suspension 1,000 mg of elemental calcium  1,000 mg of elemental calcium Per Tube TID WC Emokpae, Courage, MD      . ceFEPIme (MAXIPIME) 2 g in sodium chloride 0.9 % 100 mL IVPB  2 g Intravenous Q12H Emokpae, Courage, MD 200 mL/hr at 11/02/18 0245 2 g at 11/02/18 0245  . dextrose 5 % and 0.45 % NaCl with KCl 10 mEq/L infusion   Intravenous Continuous Shon Hale, MD 75 mL/hr at 11/02/18 0455    . feeding supplement (OSMOLITE 1.5 CAL) liquid 1,000 mL  1,000 mL Per Tube Continuous Emokpae, Courage, MD 30 mL/hr at 11/02/18 0252 1,000 mL at 11/02/18 0252  . feeding supplement (PRO-STAT SUGAR FREE 64) liquid 30 mL  30 mL Per Tube BID Shon Hale, MD      . Melene Muller ON 11/03/2018] folic acid (FOLVITE) tablet 1 mg  1 mg Per Tube Daily Emokpae, Courage, MD      . heparin injection 5,000 Units  5,000 Units Subcutaneous Q8H Shon Hale, MD   5,000 Units at 11/02/18 0529  . imipramine (TOFRANIL) tablet 10 mg  10 mg Per Tube QHS Emokpae, Courage, MD      . lipase/protease/amylase (CREON) capsule 36,000 Units  36,000 Units Oral TID WC Emokpae, Courage, MD      . MEDLINE mouth rinse  15 mL Mouth Rinse BID Emokpae, Courage, MD   15  mL at 11/02/18 0102  . metroNIDAZOLE (FLAGYL) IVPB 500 mg  500 mg Intravenous Q8H Emokpae, Courage, MD 100 mL/hr at 11/02/18 0413 500 mg at 11/02/18 0413  . morphine 2 MG/ML injection 2 mg  2 mg Intravenous Q2H PRN Emokpae, Courage, MD      . nicotine (NICODERM CQ - dosed in mg/24 hours) patch 14 mg  14 mg Transdermal Daily Emokpae, Courage, MD   14 mg at 11/02/18 0326  . ondansetron (ZOFRAN) tablet 4 mg  4 mg Oral Q6H PRN Emokpae, Courage, MD       Or  . ondansetron (ZOFRAN) injection 4 mg  4 mg Intravenous Q6H PRN Mariea Clonts, Courage, MD   4 mg at 11/02/18 0347  . oxyCODONE (ROXICODONE) 5 MG/5ML solution 5 mg  5 mg Per Tube Q4H PRN Emokpae, Courage, MD      . pantoprazole sodium (PROTONIX) 40 mg/20 mL oral suspension 40 mg  40 mg Per Tube BID AC Emokpae, Courage, MD      . polyethylene glycol (MIRALAX / GLYCOLAX) packet 17 g  17 g Per Tube Daily PRN Emokpae, Courage, MD      . sertraline (ZOLOFT) tablet 50 mg  50 mg Per Tube Daily Emokpae, Courage, MD      . sodium chloride flush (NS) 0.9 % injection 3 mL  3 mL Intravenous Q12H Emokpae, Courage, MD      . sodium chloride flush (NS) 0.9 % injection 3 mL  3 mL Intravenous PRN Emokpae, Courage, MD      . Melene Muller ON 11/03/2018] thiamine (VITAMIN B-1) tablet 100 mg  100 mg Per Tube Daily Emokpae, Courage, MD      . traZODone (DESYREL) tablet 50 mg  50 mg Per Tube QHS PRN Shon Hale, MD       Allergies as of 11/01/2018 - Review Complete 11/01/2018  Allergen Reaction Noted  . Doxycycline Other (See Comments) 07/07/2012  . Sulfa antibiotics Nausea And Vomiting and Other (See Comments) 11/10/2010   Family History  Problem Relation Age of Onset  . Depression Mother        living  . Pancreatic cancer Father        PATIENT STATES PROSTATE CANCER. UNCLEAR IF ACTUAL PANCREATIC CANCER  . Colon cancer Neg Hx   . Colon polyps Neg Hx    Social History   Socioeconomic History  . Marital status: Widowed    Spouse name: Not on file  . Number of  children: Not on file  . Years of education: Not on file  . Highest education level: Not on file  Occupational History  . Not on file  Social Needs  . Financial resource strain: Not on file  . Food insecurity:    Worry: Not on file    Inability: Not on file  . Transportation needs:    Medical: Not on file    Non-medical: Not on file  Tobacco Use  . Smoking status: Current Some Day Smoker    Packs/day: 1.00    Years: 8.00    Pack years: 8.00    Types: Cigarettes  . Smokeless tobacco: Never Used  . Tobacco comment: several cigarretes a day  Substance and Sexual Activity  . Alcohol use: Yes    Alcohol/week: 3.0 standard drinks    Types: 3 Cans of beer per week    Comment: last drink 06/05/18, hx of ETOH abuse  . Drug use: Not Currently    Types: Marijuana    Comment: last time used 1` month ago. DENIED DRUG USE  on 10/15/2018  . Sexual activity: Yes    Birth control/protection: None  Lifestyle  . Physical activity:    Days per week: Not on file    Minutes per session: Not on file  . Stress: Not on file  Relationships  . Social connections:    Talks on phone: Not on file    Gets together: Not on file    Attends religious service: Not on file    Active member of club or organization: Not on file    Attends meetings of clubs or organizations: Not on file    Relationship status: Not on file  . Intimate partner violence:    Fear of current or ex partner: Not on file  Emotionally abused: Not on file    Physically abused: Not on file    Forced sexual activity: Not on file  Other Topics Concern  . Not on file  Social History Narrative  . Not on file   Review of Systems: PER HPI OTHERWISE ALL SYSTEMS ARE NEGATIVE.  Vitals: Blood pressure 120/76, pulse (!) 135, temperature 98.2 F (36.8 C), temperature source Oral, resp. rate 20, height 5\' 1"  (1.549 m), weight 45.3 kg, last menstrual period 04/12/2013, SpO2 100 %.  Physical Exam: General:   Alert,  IN MILD DISTRESS,  pleasant and cooperative Head:  Normocephalic and atraumatic. Eyes:  Sclera clear, no icterus.Conjunctiva pink. Mouth:  No lesions, dentition ABnormal. Neck:  Supple; no masses. Lungs:  Clear throughout to auscultation.   No wheezes. No acute distress. Heart:  Regular rate and rhythm; no murmurs. Abdomen:  Soft, tender x 4 and MILDLY distended. No masses noted. Normal bowel sounds, without guarding, and without rebound.   Msk:  Symmetrical without gross deformities. Normal posture. Extremities:  Without edema. Neurologic:  Alert and  oriented x4;  NO  NEW FOCAL DEFICITS Cervical Nodes:  No significant cervical adenopathy. Psych:  Alert and cooperative. ANXIOUS mood and FLAT affect.  Lab Results: Recent Labs    11/01/18 1737 11/02/18 0432  WBC 15.8* 8.6  HGB 9.2* 7.5*  HCT 28.8* 24.9*  PLT 946* 612*   BMET Recent Labs    11/01/18 1737 11/02/18 0432  NA 131* 137  K 3.8 3.6  CL 96* 103  CO2 24 25  GLUCOSE 330* 115*  BUN 27* 24*  CREATININE 0.37* <0.30*  CALCIUM 9.3 8.5*   LFT Recent Labs    11/02/18 0432  PROT 5.4*  ALBUMIN 1.9*  AST 25  ALT 86*  ALKPHOS 212*  BILITOT 0.7     Studies/Results: MAY 21-CT SCAN ABD/PELVIS: MULTIPLE PSEUDOCYSTS, RETAINED STOOL AND URINE; pCXR: MILD CM, SML R PLEURAL EFFUSION   LOS: 1 day      11/02/2018, 8:18 AM

## 2018-11-02 NOTE — Progress Notes (Signed)
Initial Nutrition Assessment  DOCUMENTATION CODES:  Severe malnutrition in context of chronic illness  INTERVENTION:  Increase Osmolite 1.5 to goal rate of 40cc/hr (960 ml per day) and decrease prostat to 30 ml Q24 hts to provide 1540 kcals, 75 gm protein, 732 ml free water daily.  If IVF are d/cd, would recommend addition of 250 cc flush q6 to meet 100% fluid needs  Question if creon administration needs adjusted since she is receiving a 24hr feed instead of "3x daily meals" as ordered now  Question if changing PEG to PEJ in IR to limit pancreatic stimulation would alleviate some of pts symptoms/pain.   NUTRITION DIAGNOSIS:  Severe Malnutrition related to chronic illness(Pancreatitis/etoh abuse) as evidenced by severe fat/muscle wasting  GOAL:  Patient will meet greater than or equal to 90% of their needs  MONITOR:  PO intake, Labs, I & O's, Supplement acceptance, Diet advancement, Weight trends, TF tolerance  REASON FOR ASSESSMENT:  Consult Enteral/tube feeding initiation and management, Assessment of nutrition requirement/status  ASSESSMENT:  48 y/o female PMHx etoh abuse, anxiety, depression, chronic pancreatitis. Recently hospitalized 5/3-5/12 for AoC pancreatitis w/ pseudocysts. Initially necessitating an NJ tube w/ eventual transition to PEG and d/c on TF. Now represents with worsening abdominal pain and nausea. Labs show elevated WBC, LFTs and lipase. Readmitted for management of AoC pancreatitis.   Pt was discharged on a TF regimen of Osmolite 1.5 @ 40 ml/hr x24 -provides 1440 kcal, 60 gr protein and 732 ml water daily. Her TF was not constructed to meet 100% of needs, as pt was maintaining some level of oral intake.     Pt says she was complaint with TF at home. She was running it q24 hrs. She says she was NOT taking the creon because it would cost her 800 dollars or so. She says she was not given any samples. She states she was taking nothing by mouth at home.   In  regards to tolerance, she says the abdominal pain she reported this admission is not new, rather she says she has had a constant level of abdominal pain since her prior admission. She denies the PEG feeds causing any increase in her abdominal pain. She also denies any diarrhea. She reports having only a single episode of vomiting at home, but several episodes of dry heaving.   Weight wise, the patient was admitted at 98.1 lbs and has largely remained between 95-105 lbs since the start of the year.   Will slightly adjust TF regimen to better meet needs. Question if changing PEG to PEJ in IR to limit pancreatic stimulation would alleviate some of pts symptoms. All question if creon administration needs adjusted since she is receiving a 24hr feed instead of "3x daily meals" as ordered now  Labs: WBC:15.8, Lipase 63->800, Na: 131, Albumin:2.4, elevated LFTs, BGs: 115 Meds: 30 ml prostat BID, folate, calc carb, creon, miralax, ppi, zoloft, thiamine, IVF, IV abx, Prn Dilaudid  Recent Labs  Lab 11/01/18 1737 11/02/18 0432  NA 131* 137  K 3.8 3.6  CL 96* 103  CO2 24 25  BUN 27* 24*  CREATININE 0.37* <0.30*  CALCIUM 9.3 8.5*  MG 1.8  --   GLUCOSE 330* 115*   NUTRITION - FOCUSED PHYSICAL EXAM:   Most Recent Value  Orbital Region  Severe depletion  Upper Arm Region  Severe depletion  Thoracic and Lumbar Region  Severe depletion  Buccal Region  Moderate depletion  Temple Region  Severe depletion  Clavicle Bone Region  Severe depletion  Clavicle and Acromion Bone Region  Severe depletion  Scapular Bone Region  Severe depletion  Dorsal Hand  Mild depletion  Patellar Region  Severe depletion  Anterior Thigh Region  Severe depletion  Posterior Calf Region  Severe depletion  Edema (RD Assessment)  None  Hair  Reviewed  Eyes  Reviewed  Mouth  Reviewed  Skin  Reviewed       Diet Order:   Diet Order            Diet NPO time specified Except for: Sips with Meds  Diet effective now              EDUCATION NEEDS:  Not appropriate for education at this time  Skin: PU stage I to sacrum  Last BM:  5/20  Height:  Ht Readings from Last 1 Encounters:  11/02/18 5\' 1"  (1.549 m)   Weight:  Wt Readings from Last 1 Encounters:  11/02/18 45.3 kg   Wt Readings from Last 10 Encounters:  11/02/18 45.3 kg  10/23/18 48.8 kg  07/28/18 59.9 kg  06/05/18 61.2 kg  09/11/17 44.6 kg  08/03/17 44.5 kg  03/12/17 40.4 kg  02/10/17 58.1 kg  02/12/16 58.1 kg  05/14/15 61.7 kg   Ideal Body Weight:  47.27 kg  BMI:  Body mass index is 18.87 kg/m.  Estimated Nutritional Needs:  Kcal:  1500-1700 kcals (33-38) kcal/kg bw) Protein:  68-82g Pro (1.5-1.8g/kg bw)  Fluid:  >1.6 L fluid (35 ml/kg bw)  Christophe Louis RD, LDN, CNSC Clinical Nutrition Available Tues-Sat via Pager: [phone] 11/02/2018 10:53 AM

## 2018-11-02 NOTE — Progress Notes (Signed)
Patient voided 200cc of amber urine, post void residual 700. MD notified, I/O cath returned 750cc amber urine.

## 2018-11-02 NOTE — Progress Notes (Signed)
PROGRESS NOTE  Monique Nguyen:[id number] DOB: 11-05-1970 DOA: 11/01/2018 PCP: Patient, No Pcp Per  Brief History:  48 year old female with a history of alcohol induced chronic pancreatitis, failure to thrive, pancreatic pseudocyst, anxiety/depression, and chronic abdominal pain presenting with nausea, vomiting, and worsening abdominal pain for 2 to 3 days duration.  The patient was recently discharged from the hospital after a long stay from 10/14/2018 through 10/23/2018.  During that hospital stay, the patient was treated for acute on chronic pancreatitis as well as failure to thrive.  EGD was performed on 10/16/2018 which was unremarkable and did not show gastric outlet obstruction.  She initially had an NJ tube, but when this was dislodged, gastrostomy tube was ultimately placed.  She was sent home with Osmolite enteral feedings.  She states that she has drank fluids by mouth, but has not put any thing of substance or solid by mouth.  She has some nausea when her gastrostomy tube is flushed, but has emesis occasionally when she takes any fluids by mouth.  Her last bowel movement was on 10/30/2018.  She states that there was formed.  She has not drank alcohol in nearly 2 months.  She has not smoked in over 1 month.  Patient states that she had a fever of 101.1 F at home on 11/01/2018.  She denies any headache, sore throat, coughing, hemoptysis.  She has some intermittent shortness of breath.  She denies any new medications, but states that she has been taking 2 ibuprofen on a daily basis.  [facility] nurse sent patient to ED for fever of 101.1 on 11/01/18. In the emergency department, the patient was afebrile hemodynamically stable saturating 100% on 2 L.  CT of the abdomen and pelvis showed multiple pancreatic cystic lesions consistent with pseudocysts located in the GE junction, pancreatic body, and pancreatic head.  The gastrostomy tube was in the appropriate position.  The cecum was markedly distended  with stool.  In addition, she had a markedly distended urinary bladder without hydronephrosis.  WBC was 15.8 with lipase 803.  LFTs were unremarkable.  Urinalysis showed 11-20 WBC.  GI was consulted to assist with management.  Assessment/Plan: Acute on chronic pancreatitis -Referral has been sent to [facility], Dr. [name] for EUS/ERCP -Appreciate [facility] consult--> increase Creon; added MiraLAX and Dilaudid -Continue enteral feedings -Continue PPI -Continue IV fluids--change to LR -increase Creon dose per GI -11/01/2018 CT abdomen and pelvis--small bilateral pleural effusions.  Multiple.  Pancreatic cystic lesions in the GE junction, anterior pancreatic body, and pancreatic head.  Aspiration pneumonitis -Patient had numerous episodes of emesis -Personally reviewed chest x-ray--right basilar opacity -Continue cefepime and metronidazole -Check procalcitonin -Plan to de-escalate antibiotics fairly rapidly if patient remains clinically stable and procalcitonin is unremarkable  Pyuria -Concerning for UTI -Continue cefepime pending culture data  Chronic abdominal pain -Multifactorial including constipation, urinary retention, acute on chronic pancreatitis -Continue hydromorphone per GI -Continue MiraLAX around-the-clock  Failure to thrive -Continue enteral feeding -continue ProStat  Urinary retention -check PVR  Stage 1 sacral skin breakdown -present at time of admission -continue local wound care     Disposition Plan:   Home in 2-3 days if stable Family Communication:  No  Family at bedside  Consultants: [facility]  Code Status:  FULL   DVT Prophylaxis:  [place] Heparin   Procedures: As Listed in Progress Note Above  Antibiotics: Cefepime 5/21>>> Metronidazole 5/21>>>       Subjective:  Pt c/omplains of pain all over.  States abd pain is about the same.  She has nausea without emesis.  No f/c ,cp, hematochezia. Headache.  No dysuria or  melena.  Objective: Vitals:   11/02/18 0032 11/02/18 0500 11/02/18 0502 11/02/18 1022  BP: 119/75  120/76   Pulse: (!) 122  (!) 135   Resp: 15  20   Temp: 98.4 F (36.9 C)  98.2 F (36.8 C)   TempSrc: Oral  Oral   SpO2: 100%  100% 100%  Weight: 44.5 kg 45.3 kg    Height:  (1.549 m)       Intake/Output Summary (Last 24 hours) at 11/02/2018 1025 Last data filed at 11/02/2018 0900 Gross per 24 hour  Intake 200.12 ml  Output --  Net 200.12 ml   Weight change:  Exam:   General:  Pt is alert, follows commands appropriately, not in acute distress  HEENT: No icterus, No thrush, No neck mass, [place]/AT  Cardiovascular: RRR, S1/S2, no rubs, no gallops  Respiratory: bibasilar crackles. No wheeze  Abdomen: Soft/+BS, generalized tender with feather touch, non distended  Extremities: No edema, No lymphangitis, No petechiae, No rashes, no synovitis   Data Reviewed: I have personally reviewed following labs and imaging studies Basic Metabolic Panel: Recent Labs  Lab 11/01/18 1737 11/02/18 0432  NA 131* 137  K 3.8 3.6  CL 96* 103  CO2 24 25  GLUCOSE 330* 115*  BUN 27* 24*  CREATININE 0.37* <0.30*  CALCIUM 9.3 8.5*  MG 1.8  --    Liver Function Tests: Recent Labs  Lab 11/01/18 1737 11/02/18 0432  AST 57* 25  ALT 132* 86*  ALKPHOS 310* 212*  BILITOT 0.6 0.7  PROT 6.9 5.4*  ALBUMIN 2.4* 1.9*   Recent Labs  Lab 11/01/18 1737  LIPASE 803*   No results for input(s): AMMONIA in the last 168 hours. Coagulation Profile: No results for input(s): INR, PROTIME in the last 168 hours. CBC: Recent Labs  Lab 11/01/18 1737 11/02/18 0432  WBC 15.8* 8.6  NEUTROABS 13.5*  --   HGB 9.2* 7.5*  HCT 28.8* 24.9*  MCV 96.0 99.6  PLT 946* 612*   Cardiac Enzymes: No results for input(s): CKTOTAL, CKMB, CKMBINDEX, TROPONINI in the last 168 hours. BNP: Invalid input(s): POCBNP CBG: No results for input(s): GLUCAP in the last 168 hours. HbA1C: No results for input(s):  HGBA1C in the last 72 hours. Urine analysis:    Component Value Date/Time   COLORURINE AMBER (A) 11/01/2018 2315   APPEARANCEUR CLOUDY (A) 11/01/2018 2315   LABSPEC 1.036 (H) 11/01/2018 2315   PHURINE 8.0 11/01/2018 2315   GLUCOSEU >=500 (A) 11/01/2018 2315   HGBUR NEGATIVE 11/01/2018 2315   BILIRUBINUR NEGATIVE 11/01/2018 2315   KETONESUR NEGATIVE 11/01/2018 2315   PROTEINUR 100 (A) 11/01/2018 2315   UROBILINOGEN 0.2 03/06/2015 1558   NITRITE NEGATIVE 11/01/2018 2315   LEUKOCYTESUR NEGATIVE 11/01/2018 2315   Sepsis Labs: (procalcitonin:4,lacticidven:4) ) Recent Results (from the past 240 hour(s))  Blood culture (routine x 2)     Status: None (Preliminary result)   Collection Time: 11/01/18  5:37 PM  Result Value Ref Range Status   Specimen Description BLOOD LEFT HAND  Final   Special Requests   Final    BOTTLES DRAWN AEROBIC AND ANAEROBIC Blood Culture results may not be optimal due to an excessive volume of blood received in culture bottles   Culture   Final    NO GROWTH < 24 HOURS  Performed at [facility], [address] [place], [place] [address]    Report Status PENDING  Incomplete  Blood culture (routine x 2)     Status: None (Preliminary result)   Collection Time: 11/01/18  6:00 PM  Result Value Ref Range Status   Specimen Description BLOOD RIGHT ANTECUBITAL  Final   Special Requests   Final    BOTTLES DRAWN AEROBIC AND ANAEROBIC Blood Culture adequate volume   Culture   Final    NO GROWTH < 24 HOURS Performed at [facility], [address], [place], [place] [address]    Report Status PENDING  Incomplete  SARS Coronavirus 2 (CEPHEID- Performed in [facility] hospital lab), Hosp Order     Status: None   Collection Time: 11/01/18  6:36 PM  Result Value Ref Range Status   SARS Coronavirus 2 NEGATIVE NEGATIVE Final    Comment: (NOTE) If result is NEGATIVE SARS-CoV-2 target nucleic acids are NOT DETECTED. The SARS-CoV-2 RNA is generally detectable in  upper and lower  respiratory specimens during the acute phase of infection. The lowest  concentration of SARS-CoV-2 viral copies this assay can detect is 250  copies / mL. A negative result does not preclude SARS-CoV-2 infection  and should not be used as the sole basis for treatment or other  patient management decisions.  A negative result may occur with  improper specimen collection / handling, submission of specimen other  than nasopharyngeal swab, presence of viral mutation(s) within the  areas targeted by this assay, and inadequate number of viral copies  (<250 copies / mL). A negative result must be combined with clinical  observations, patient history, and epidemiological information. If result is POSITIVE SARS-CoV-2 target nucleic acids are DETECTED. The SARS-CoV-2 RNA is generally detectable in upper and lower  respiratory specimens dur ing the acute phase of infection.  Positive  results are indicative of active infection with SARS-CoV-2.  Clinical  correlation with patient history and other diagnostic information is  necessary to determine patient infection status.  Positive results do  not rule out bacterial infection or co-infection with other viruses. If result is PRESUMPTIVE POSTIVE SARS-CoV-2 nucleic acids MAY BE PRESENT.   A presumptive positive result was obtained on the submitted specimen  and confirmed on repeat testing.  While 2019 novel coronavirus  (SARS-CoV-2) nucleic acids may be present in the submitted sample  additional confirmatory testing may be necessary for epidemiological  and / or clinical management purposes  to differentiate between  SARS-CoV-2 and other Sarbecovirus currently known to infect humans.  If clinically indicated additional testing with an alternate test  methodology [phone]) is advised. The SARS-CoV-2 RNA is generally  detectable in upper and lower respiratory sp ecimens during the acute  phase of infection. The expected result is  Negative. Fact Sheet for Patients:  BoilerBrush.com.cy Fact Sheet for Healthcare Providers: https://pope.com/ This test is not yet approved or cleared by the [place] FDA and has been authorized for detection and/or diagnosis of SARS-CoV-2 by FDA under an Emergency Use Authorization (EUA).  This EUA will remain in effect (meaning this test can be used) for the duration of the COVID-19 declaration under Section 564(b)(1) of the Act, 21 U.S.C. section 360bbb-3(b)(1), unless the authorization is terminated or revoked sooner. Performed at [facility], [address], [place], [place] [address]      Scheduled Meds:  calcium carbonate (dosed in mg elemental calcium)  1,000 mg of elemental calcium Per Tube TID WC   feeding  supplement (PRO-STAT SUGAR FREE 64)  30 mL Per Tube BID   [START ON 11/03/2018] folic acid  1 mg Per Tube Daily   heparin  5,000 Units Subcutaneous Q8H    HYDROmorphone (DILAUDID) injection  1 mg Intravenous Q6H   imipramine  10 mg Per Tube QHS   lipase/protease/amylase  72,000 Units Oral TID WC   mouth rinse  15 mL Mouth Rinse BID   nicotine  14 mg Transdermal Daily   pantoprazole sodium  40 mg Per Tube BID AC   polyethylene glycol  17 g Per Tube Q3H   sertraline  50 mg Per Tube Daily   sodium chloride flush  3 mL Intravenous Q12H   [START ON 11/03/2018] thiamine  100 mg Per Tube Daily   Continuous Infusions:  sodium chloride     ceFEPime (MAXIPIME) IV 2 g (11/02/18 0245)   feeding supplement (OSMOLITE 1.5 CAL) 1,000 mL (11/02/18 0252)   lactated ringers with kcl 100 mL/hr at 11/02/18 0905   metronidazole 500 mg (11/02/18 0413)    Procedures/Studies: Ct Abdomen Pelvis W Contrast  Result Date: 11/01/2018 CLINICAL DATA:  Generalized abdominal pain EXAM: CT ABDOMEN AND PELVIS WITH CONTRAST TECHNIQUE: Multidetector CT imaging of the abdomen and pelvis was performed using the standard protocol  following bolus administration of intravenous contrast. CONTRAST:  OMNIPAQUE IOHEXOL 300 MG/ML  SOLN COMPARISON:  CT abdomen pelvis 10/14/2018 FINDINGS: LOWER CHEST: Small pleural effusions and bibasilar atelectasis. HEPATOBILIARY: The hepatic contours and density are normal. There is no intra- or extrahepatic biliary dilatation. The gallbladder is normal. PANCREAS: There are multiple peripancreatic cystic lesions. The most superior, near the gastro esophageal junction, now measures 1.7 cm, previously 2.5 cm. Collection along the anterior pancreatic body measures 3.3 x 3.2 cm, previously 2.4 x 1.8 cm. Collection of the pancreatic head measures 2.7 x 2.0 cm, previously 4.0 x 2.9 cm. Chronic atrophy of the pancreatic tail and distal body. SPLEEN: Normal. ADRENALS/URINARY TRACT: --Adrenal glands: Normal. --Right kidney/ureter: No hydronephrosis, nephroureterolithiasis, perinephric stranding or solid renal mass. --Left kidney/ureter: No hydronephrosis, nephroureterolithiasis, perinephric stranding or solid renal mass. --Urinary bladder: Markedly dilated. STOMACH/BOWEL: --Stomach/Duodenum: Gastrostomy tube is present within the stomach. --Small bowel: No dilatation or inflammation. --Colon: The cecum is markedly stool distended. --Appendix: Normal. VASCULAR/LYMPHATIC: Normal course and caliber of the major abdominal vessels. No abdominal or pelvic lymphadenopathy. REPRODUCTIVE: Normal uterus and ovaries. Small amount of free fluid in the pelvis. MUSCULOSKELETAL. No bony spinal canal stenosis or focal osseous abnormality. OTHER: None. IMPRESSION: 1. Multiple upper abdominal fluid collections, including collections in the pancreatic head and body and at the gastroesophageal junction, with mixed changes in size; the pancreatic body collection showing the greatest growth and the pancreatic head collection showing the greatest decrease. These collections are most consistent with pancreatic pseudocysts. No specific  findings of infection, though this would be difficult to entirely exclude. 2. Distended and stool-filled cecum. 3. Markedly distended urinary bladder without hydronephrosis. 4. Decreased density of fluid in the posterior cul-de-sac, likely resolving hemoperitoneum. Electronically Signed   By: Deatra Robinson M.D.   On: 11/01/2018 22:05   Ct Abdomen Pelvis W Contrast  Result Date: 10/14/2018 CLINICAL DATA:  Inpatient. Failure to thrive. Chronic abdominal pain. Difficulty swallowing with sensation of food sticking in throat. Weight loss. History of pancreatitis. EXAM: CT ABDOMEN AND PELVIS WITH CONTRAST TECHNIQUE: Multidetector CT imaging of the abdomen and pelvis was performed using the standard protocol following bolus administration of intravenous contrast. CONTRAST:  90mL OMNIPAQUE IOHEXOL 300  MG/ML SOLN, 75mL OMNIPAQUE IOHEXOL 300 MG/ML SOLN COMPARISON:  10/14/2018 abdominal radiographs. 08/04/2017 CT abdomen/pelvis. FINDINGS: Motion degraded scan, partially limiting assessment. Lower chest: Right middle lobe 2 mm solid pulmonary nodule (series 5/image 1), not previously imaged. Additional tiny 2 mm pulmonary nodules at both lung bases are unchanged from 2017 CT, considered benign. Irregular thick walled 2.5 x 1.7 x 3.2 cm fluid collection along the anterior margin of the lower thoracic esophagus (series 2/image 11), new since 08/04/2017 CT. Similar irregular thick walled curvilinear fluid collection posterior to the esophagogastric junction extending into the right retrocrural region measuring 3.2 x 1.5 x 4.1 cm (series 2/image 17), new. Hepatobiliary: Normal liver size. No liver mass. Normal gallbladder with no radiopaque cholelithiasis. No biliary ductal dilatation. CBD diameter 5 mm. Pancreas: Chronic atrophy of distal body and tail of the pancreas with associated pancreatic duct dilation in these pancreatic segments, compatible with chronic pancreatitis. New thick-walled 4.1 x 2.9 x 3.5 cm pancreatic head  cystic lesion (series 2/image 26). Posterior pancreatic head 1.0 x 0.8 cm cystic lesion (series 2/image 28), mildly increased from 0.7 x 0.6 cm. New clustered cystic pancreatic lesions in the pancreatic body, largest 2.4 x 1.7 cm anteriorly (series 2/image 21). No significant acute peripancreatic fat stranding. Spleen: Normal size. No mass. Adrenals/Urinary Tract: Normal adrenals. Simple 1.1 cm upper left renal cyst. Otherwise normal kidneys, with no hydronephrosis. Chronic mild diffuse bladder wall thickening with moderate bladder distention. Stomach/Bowel: Mild wall thickening throughout the esophagogastric junction. Irregular thick walled fluid collection along the posterior margin of the antropyloric stomach measuring approximately 3.7 x 2.0 x 5.1 cm (series 2/image 31), new, exerting mild extrinsic mass-effect on the distal stomach. Stomach is not significantly distended. Normal caliber small bowel with no small bowel wall thickening. Oral contrast transits to the distal small bowel. Normal appendix. Normal large bowel with no diverticulosis, large bowel wall thickening or pericolonic fat stranding. Vascular/Lymphatic: Atherosclerotic nonaneurysmal abdominal aorta. Patent hepatic, portal and renal veins. Diminutive splenic vein, which remains patent, unchanged. No pathologically enlarged lymph nodes in the abdomen or pelvis. New ill-defined curvilinear soft tissue in the central mesentery, which appears to communicate with the posterior distal gastric fluid collection. Reproductive: Grossly normal uterus. No discrete adnexal mass. Small volume hemoperitoneum in the pelvic cul-de-sac. Other: No pneumoperitoneum.  No abdominal ascites. Musculoskeletal: No aggressive appearing focal osseous lesions. IMPRESSION: 1. 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 Redemonstrated findings of chronic pancreatitis in the  distal body and tail of the pancreas. 2. New irregular thick walled fluid collections encasing the esophagogastric junction anteriorly and posteriorly with associated wall thickening throughout the esophagogastric junction. Similar new irregular thick walled fluid collection along the posterior margin of the antropyloric stomach with some mass effect on the distal stomach, which is not significantly distended. These nonspecific findings also presumably represent pancreatic pseudocysts with reactive wall thickening at the esophagogastric junction and distal stomach. Infected collections cannot be excluded. 3. Close CT or MRI abdomen follow-up imaging advised. 4. No biliary ductal dilatation.  No radiopaque cholelithiasis. 5. Nonspecific small volume hemoperitoneum in the pelvic cul-de-sac. No discrete adnexal mass. Suggest follow-up pelvic ultrasound on a short-term basis when clinically feasible. 6. Nonspecific chronic diffuse bladder wall thickening. Moderate bladder distention. No hydronephrosis. 7. Right middle lobe 2 mm solid pulmonary nodule, not previously imaged. No follow-up needed if patient is low-risk. Non-contrast chest CT can be considered in 12 months if patient  is high-risk. This recommendation follows the consensus statement: Guidelines for Management of Incidental Pulmonary Nodules Detected on CT Images:From the Fleischner Society 2017; published online before print ([phone]). 8.  Aortic Atherosclerosis (ICD10-I70.0). Electronically Signed   By: Delbert PhenixJason A Poff M.D.   On: 10/14/2018 12:49   Dg Chest Portable 1 View  Result Date: 11/01/2018 CLINICAL DATA:  Fever EXAM: PORTABLE CHEST 1 VIEW COMPARISON:  Chest x-ray dated 10/09/2014 FINDINGS: Cardiac silhouette is enlarged. There is blunting of the right costophrenic angle with a right basilar airspace opacity. There is no pneumothorax. No large pleural effusion. There may be generalized volume overload. There is nonspecific gaseous  distention of the partially visualized stomach. IMPRESSION: 1. Blunting of the right costophrenic angle favored to be secondary to a combination of a trace right-sided effusion and atelectasis. An infiltrate or aspiration is not excluded. 2. Mild cardiomegaly. Electronically Signed   By: Katherine Mantlehristopher  Green M.D.   On: 11/01/2018 18:23   Dg Abdomen Acute W/chest  Result Date: 10/14/2018 CLINICAL DATA:  Vomiting EXAM: DG ABDOMEN ACUTE W/ 1V CHEST COMPARISON:  08/04/2017 CT abdomen/pelvis. 10/09/2014 chest radiograph. FINDINGS: Stable cardiomediastinal silhouette with normal heart size. No pneumothorax. No pleural effusion. Lungs appear clear, with no acute consolidative airspace disease and no pulmonary edema. No dilated small bowel loops or air-fluid levels. Moderate gas and mild stool throughout the colon. No evidence of pneumatosis or pneumoperitoneum. No radiopaque nephrolithiasis. IMPRESSION: 1. No active cardiopulmonary disease. 2. Nonobstructive bowel gas pattern. Moderate gas and mild stool in the colon. Electronically Signed   By: Delbert PhenixJason A Poff M.D.   On: 10/14/2018 09:25   Dg Vangie BickerNaso G Tube Plc W/fl W/rad  Result Date: 10/17/2018 CLINICAL DATA:  Feeding tube placement, chronic pancreatitis, weight loss, failure to thrive EXAM: NASO G TUBE PLACEMENT WITH FL AND WITH RAD CONTRAST:  15 mL ISOVUE-300 IOPAMIDOL (ISOVUE-300) INJECTION 61% FLUOROSCOPY TIME:  Fluoroscopy Time:  3 minutes 12 seconds Radiation Exposure Index (if provided by the fluoroscopic device): 33.9 mGy Number of Acquired Spot Images: Single cine fluoroscopic series COMPARISON:  CT abdomen and pelvis 10/14/2018 FINDINGS: Pharynx was anesthetized with Cetacaine spray. Utilizing lubricant, a feeding tube was passed through the RIGHT nasal passages in the hypopharynx. With patient swallowing and neck flexed, feeding tube was advanced into the thoracic esophagus and down into the stomach. Catheter was manipulated and placed across the pylorus into  the duodenum. Catheter was advanced to the level of ligament of Treitz. Contrast injection confirms presence of the catheter tip at the ligament of Treitz. IMPRESSION: Placement of nasogastric feeding tube to the ligament of Treitz. Electronically Signed   By: Ulyses SouthwardMark  Boles M.D.   On: 10/17/2018 18:07   Dg Esophagus W Single Cm (sol Or Thin Ba)  Result Date: 10/15/2018 CLINICAL DATA:  Dysphagia, lower esophageal and epigastric pain EXAM: ESOPHOGRAM / BARIUM SWALLOW / BARIUM TABLET STUDY TECHNIQUE: Combined double contrast and single contrast examination performed using effervescent crystals, thick barium liquid, and thin barium liquid. The patient was observed with fluoroscopy swallowing a 13 mm barium sulphate tablet. FLUOROSCOPY TIME:  Fluoroscopy Time:  1 minutes 30 seconds Radiation Exposure Index (if provided by the fluoroscopic device): 17.9 mGy Number of Acquired Spot Images: multiple fluoroscopic screen captures COMPARISON:  None FINDINGS: Esophageal distention: Normal without mass or stricture Filling defects:  None 12.5 mm barium tablet: Easily passed from oral cavity to stomach without obstruction. Motility:  Normal Mucosa:  Smooth without irregularity or ulceration Hypopharynx/cervical esophagus: Normal motion without laryngeal penetration or  aspiration Hiatal hernia:  Absent GE reflux:  Not identified during exam Other:  N/A IMPRESSION: Unremarkable esophagram. Electronically Signed   By: Ulyses Southward M.D.   On: 10/15/2018 15:06    Catarina Hartshorn, DO  [facility] Pager [phone]  If 7PM-7AM, please contact night-coverage [email or web address] Password [email or web address] 11/02/2018, 10:25 AM   LOS: 1 day

## 2018-11-02 NOTE — Progress Notes (Signed)
Patient had new onset of a.fib with rvr according to telemetry.  A.fib verified by EKG on floor and placed in chart. Orders received from mid-level.  Will continue to monitor patient.

## 2018-11-02 NOTE — TOC Initial Note (Signed)
Transition of Care [facility]) - Initial/Assessment Note    Patient Details  Name: Monique Nguyen MRN: [id number] Date of Birth: 1970-10-04  Transition of Care [facility]) CM/SW Contact:    Ida Rogue, LCSW Phone Number: 11/02/2018, 3:44 PM   Returning patient, circumstances have not seen.  Seen due to high risk for readmission. States she is getting Infusion services and HH services since last admission, and that [facility] nurse was the one who sent her here.  Will continue with same providers at d/c.  States she did not go to PCP follow up appointment, and cannot say if had virtual appointment or even who it was with.  Gave me permission to call SO to find out.  Left message.  No return call.                 Expected Discharge Plan: Home w Home Health Services Barriers to Discharge: No Barriers Identified   Patient Goals and CMS Choice Patient states their goals for this hospitalization and ongoing recovery are:: Feel better      Expected Discharge Plan and Services Expected Discharge Plan: Home w Home Health Services In-house Referral: Nutrition, PCP / Health Connect Discharge Planning Services: CM Consult Post Acute Care Choice: Home Health Living arrangements for the past 2 months: Single Family Home                 DME Arranged: Tube feeding DME Agency: [employer] Date DME Agency Contacted: 11/02/18 Time DME Agency Contacted: 1240 Representative spoke with at DME Agency: Priscille Loveless HH Arranged: RN, PT HH Agency: [facility] Date [facility] Agency Contacted: 11/02/18 Time HH Agency Contacted: 1430 Representative spoke with at [facility] Agency: Eugenio Hoes  Prior Living Arrangements/Services Living arrangements for the past 2 months: Single Family Home Lives with:: Adult Children, Significant Other Patient language and need for interpreter reviewed:: Yes Do you feel safe going back to the place where you live?: Yes          Current home services: Home PT, Other  (comment)(Infusion) Criminal Activity/Legal Involvement Pertinent to Current Situation/Hospitalization: No - Comment as needed  Activities of Daily Living Home Assistive Devices/Equipment: Other (Comment)(kangaroo pump) ADL Screening (condition at time of admission) Patient's cognitive ability adequate to safely complete daily activities?: Yes Is the patient deaf or have difficulty hearing?: No Does the patient have difficulty seeing, even when wearing glasses/contacts?: No Does the patient have difficulty concentrating, remembering, or making decisions?: No Patient able to express need for assistance with ADLs?: Yes Does the patient have difficulty dressing or bathing?: No Independently performs ADLs?: Yes (appropriate for developmental age) Does the patient have difficulty walking or climbing stairs?: No Weakness of Legs: Both Weakness of Arms/Hands: Both  Permission Sought/Granted Permission sought to share information with : Family Supports Permission granted to share information with : Yes, Verbal Permission Granted  Share Information with NAME: Tim     Permission granted to share info w Relationship: SO  Permission granted to share info w Contact Information: [phone]  Emotional Assessment Appearance:: Appears stated age Attitude/Demeanor/Rapport: Sedated, Engaged Affect (typically observed): Appropriate Orientation: : Oriented to Self, Oriented to Place, Oriented to Situation Alcohol / Substance Use: Not Applicable Psych Involvement: No (comment)  Admission diagnosis:  Pancreatic pseudocyst [K86.3] Generalized abdominal pain [R10.84] Hyperglycemia [R73.9] Elevated lipase [R74.8] Sepsis, due to unspecified organism, unspecified whether acute organ dysfunction present [facility]) [A41.9] Patient Active Problem List   Diagnosis Date Noted  . Pressure injury of skin  11/02/2018  . Generalized abdominal pain   . Pancreatic pseudocyst 11/01/2018  . Odynophagia   .  Protein-calorie malnutrition, severe 10/16/2018  . Chronic pancreatitis (HCC)   . Dysphagia   . Acute on chronic pancreatitis (HCC) 10/14/2018  . Adult failure to thrive 10/14/2018  . GERD (gastroesophageal reflux disease) 10/14/2018  . Dehydration 10/14/2018  . 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 Facial cellulitis 09/10/2012  . Insect bite 09/10/2012  . Depression 09/10/2012  . Anxiety 09/10/2012  . Hypokalemia 09/10/2012  . Tobacco abuse 09/10/2012  . Alcohol dependence (HCC) 06/28/2012  . Benzodiazepine abuse, continuous (HCC) 06/28/2012  . Suicide threat or attempt 06/28/2012    Class: Acute  . 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 Diarrhea 11/11/2010  . Nausea 11/11/2010  . Vaginal discharge 11/11/2010   PCP:  Patient, No Pcp Per Pharmacy:   [facility] [phone] - [place], [place] - [address] [facility] [address] [place] [id number] Phone: [phone] Fax: [phone]     Social Determinants of Health (SDOH) Interventions    Readmission Risk Interventions Readmission Risk Prevention Plan 10/23/2018 10/17/2018 10/16/2018  Transportation Screening - - Complete  PCP or Specialist Appt within 5-7 Days Complete - -  PCP or Specialist Appt within 3-5 Days - Complete -  Home Care Screening Complete - -  Medication Review (RN CM) Complete - -  HRI or Home Care Consult - - Complete  Social Work Consult for Recovery Care Planning/Counseling - - Complete  Palliative Care Screening - - Complete  Medication Review (RN Care Manager) - - Complete  Some recent data might be hidden

## 2018-11-03 ENCOUNTER — Inpatient Hospital Stay (HOSPITAL_COMMUNITY): Payer: Medicaid Other

## 2018-11-03 DIAGNOSIS — I4891 Unspecified atrial fibrillation: Secondary | ICD-10-CM

## 2018-11-03 LAB — COMPREHENSIVE METABOLIC PANEL
ALT: 55 U/L — ABNORMAL HIGH (ref 0–44)
AST: 20 U/L (ref 15–41)
Albumin: 1.7 g/dL — ABNORMAL LOW (ref 3.5–5.0)
Alkaline Phosphatase: 151 U/L — ABNORMAL HIGH (ref 38–126)
Anion gap: 10 (ref 5–15)
BUN: 23 mg/dL — ABNORMAL HIGH (ref 6–20)
CO2: 27 mmol/L (ref 22–32)
Calcium: 7.8 mg/dL — ABNORMAL LOW (ref 8.9–10.3)
Chloride: 101 mmol/L (ref 98–111)
Creatinine, Ser: <0.3 mg/dL — ABNORMAL LOW (ref 0.44–1.00)
Glucose, Bld: 111 mg/dL — ABNORMAL HIGH (ref 70–99)
Potassium: 3.3 mmol/L — ABNORMAL LOW (ref 3.5–5.1)
Sodium: 138 mmol/L (ref 135–145)
Total Bilirubin: 0.2 mg/dL — ABNORMAL LOW (ref 0.3–1.2)
Total Protein: 4.8 g/dL — ABNORMAL LOW (ref 6.5–8.1)

## 2018-11-03 LAB — CBC
HCT: 24.9 % — ABNORMAL LOW (ref 36.0–46.0)
Hemoglobin: 7.5 g/dL — ABNORMAL LOW (ref 12.0–15.0)
MCH: 30 pg (ref 26.0–34.0)
MCHC: 30.1 g/dL (ref 30.0–36.0)
MCV: 99.6 fL (ref 80.0–100.0)
Platelets: 620 10*3/uL — ABNORMAL HIGH (ref 150–400)
RBC: 2.5 MIL/uL — ABNORMAL LOW (ref 3.87–5.11)
RDW: 15.3 % (ref 11.5–15.5)
WBC: 9.6 10*3/uL (ref 4.0–10.5)
nRBC: 0 % (ref 0.0–0.2)

## 2018-11-03 LAB — ECHOCARDIOGRAM COMPLETE
Height: 61 in
Weight: 1615.53 oz

## 2018-11-03 LAB — PROCALCITONIN: Procalcitonin: 0.18 ng/mL

## 2018-11-03 LAB — URINE CULTURE: Culture: 60000 — AB

## 2018-11-03 LAB — TSH: TSH: 1.8 u[IU]/mL (ref 0.350–4.500)

## 2018-11-03 LAB — LIPASE, BLOOD: Lipase: 262 U/L — ABNORMAL HIGH (ref 11–51)

## 2018-11-03 MED ORDER — POTASSIUM CHLORIDE 20 MEQ PO PACK
20.0000 meq | PACK | Freq: Once | ORAL | Status: AC
  Administered 2018-11-03: 15:00:00 20 meq
  Filled 2018-11-03: qty 1

## 2018-11-03 MED ORDER — ONDANSETRON HCL 4 MG/2ML IJ SOLN
4.0000 mg | Freq: Three times a day (TID) | INTRAMUSCULAR | Status: DC
  Administered 2018-11-03 – 2018-11-07 (×16): 4 mg via INTRAVENOUS
  Filled 2018-11-03 (×15): qty 2

## 2018-11-03 MED ORDER — SODIUM CHLORIDE 0.9 % IV SOLN
1.0000 g | INTRAVENOUS | Status: AC
  Administered 2018-11-03 – 2018-11-05 (×3): 1 g via INTRAVENOUS
  Filled 2018-11-03 (×3): qty 10

## 2018-11-03 MED ORDER — POLYETHYLENE GLYCOL 3350 17 G PO PACK
17.0000 g | PACK | Freq: Two times a day (BID) | ORAL | Status: DC
  Administered 2018-11-03: 17 g via ORAL
  Filled 2018-11-03 (×3): qty 1

## 2018-11-03 NOTE — Progress Notes (Addendum)
PROGRESS NOTE    Monique Nguyen  XBJ:[id number] DOB: January 02, 1971 DOA: 11/01/2018 PCP: Patient, No Pcp Per   Brief Narrative:  48 year old female with Monique Nguyen history of alcohol induced chronic pancreatitis, failure to thrive, pancreatic pseudocyst, anxiety/depression, and chronic abdominal pain presenting with nausea, vomiting, and worsening abdominal pain for 2 to 3 days duration.  The patient was recently discharged from the hospital after Monique Nguyen long stay from 10/14/2018 through 10/23/2018.  During that hospital stay, the patient was treated for acute on chronic pancreatitis as well as failure to thrive.  EGD was performed on 10/16/2018 which was unremarkable and did not show gastric outlet obstruction.  She initially had an NJ tube, but when this was dislodged, gastrostomy tube was ultimately placed.  She was sent home with Osmolite enteral feedings.  She states that she has drank fluids by mouth, but has not put any thing of substance or solid by mouth.  She has some nausea when her gastrostomy tube is flushed, but has emesis occasionally when she takes any fluids by mouth.  Her last bowel movement was on 10/30/2018.  She states that there was formed.  She has not drank alcohol in nearly 2 months.  She has not smoked in over 1 month.  Patient states that she had [name] fever of 101.1 F at home on 11/01/2018.  She denies any headache, sore throat, coughing, hemoptysis.  She has some intermittent shortness of breath.  She denies any new medications, but states that she has been taking 2 ibuprofen on Cosby Proby daily basis.  [facility] nurse sent patient to ED for fever of 101.1 on 11/01/18. In the emergency department, the patient was afebrile hemodynamically stable saturating 100% on 2 L.  CT of the abdomen and pelvis showed multiple pancreatic cystic lesions consistent with pseudocysts located in the GE junction, pancreatic body, and pancreatic head.  The gastrostomy tube was in the appropriate position.  The cecum was markedly distended  with stool.  In addition, she had [name] markedly distended urinary bladder without hydronephrosis.  WBC was 15.8 with lipase 803.  LFTs were unremarkable.  Urinalysis showed 11-20 WBC.  GI was consulted to assist with management.  Assessment & Plan:   Principal Problem:   Pancreatic pseudocyst Active Problems:   Depression   Anxiety   Pancreatic cyst   Alcohol-induced chronic pancreatitis (HCC)   Acute on chronic pancreatitis (HCC)   Adult failure to thrive   GERD (gastroesophageal reflux disease)   Chronic pancreatitis (HCC)   Protein-calorie malnutrition, severe   Pressure injury of skin   Generalized abdominal pain   Acute on chronic pancreatitis -Referral has been sent to [facility], Dr. [name] for EUS/ERCP - Appreciate GI c/s, Dr. [name] - creon has been d/c'd as pt unable to afford.  Restart tube feeds.  Miralax.  -Continue enteral feedings -Continue PPI -Continue IV fluids--change to LR -11/01/2018 CT abdomen and pelvis--small bilateral pleural effusions.  Multiple upper abdominal fluid collections, most consistent with pseudocysts.  (see report)   - lipase downtrending  Aspiration pneumonitis -Patient had numerous episodes of emesis -Personally reviewed chest x-ray--right basilar opacity -Continue cefepime and metronidazole -> narrow to ceftriaxone -Procalcitonin pending collection  Pyuria -Concerning for UTI -Culture pending - Currently on ceftriaxone  Chronic abdominal pain -Multifactorial including constipation, urinary retention, acute on chronic pancreatitis -Continue hydromorphone per GI - scheduled per GI -Continue MiraLAX around-the-clock  Failure to thrive -Continue enteral feeding -continue ProStat  Urinary retention - distended bladder seen on CT  scan - follow bladder scans  Resolving Hemoperitoneum: seen on imaging, Hb seems relatively stable.  Continue to monitor.  Nonspecific per 5/3 imaging report.  Recommended follow up pelvic  ultrasound to follow.   Continue to monitor  Stage 1 sacral skin breakdown -present at time of admission -continue local wound care  Atrial Fibrillation: seen on EKG last night.  TSH is wnl.  repeat EKG today.  Continue tele. Follow echo. Chadsvasc is 1.  No indication for anticoagulation at this point. Currently in sinus rhythm.  Hypokalemia: replace, follow  DVT prophylaxis: heparin Code Status: full  Family Communication: none at bedside Disposition Plan: pending   Consultants:   GI  Procedures:   echo  Antimicrobials:  Anti-infectives (From admission, onward)   Start     Dose/Rate Route Frequency Ordered Stop   11/03/18 1245  cefTRIAXone (ROCEPHIN) 1 g in sodium chloride 0.9 % 100 mL IVPB     1 g 200 mL/hr over 30 Minutes Intravenous Every 24 hours 11/03/18 1235     11/02/18 0430  metroNIDAZOLE (FLAGYL) IVPB 500 mg     500 mg 100 mL/hr over 60 Minutes Intravenous Every 8 hours 11/02/18 0205     11/02/18 0230  ceFEPIme (MAXIPIME) 2 g in sodium chloride 0.9 % 100 mL IVPB  Status:  Discontinued     2 g 200 mL/hr over 30 Minutes Intravenous Every 12 hours 11/02/18 0205 11/03/18 1235   11/01/18 2030  metroNIDAZOLE (FLAGYL) IVPB 500 mg     500 mg 100 mL/hr over 60 Minutes Intravenous  Once 11/01/18 2029 11/01/18 2151   11/01/18 1745  ceFEPIme (MAXIPIME) 1 g in sodium chloride 0.9 % 100 mL IVPB     1 g 200 mL/hr over 30 Minutes Intravenous  Once 11/01/18 1734 11/01/18 1953   11/01/18 1745  vancomycin (VANCOCIN) IVPB 1000 mg/200 mL premix     1,000 mg 200 mL/hr over 60 Minutes Intravenous  Once 11/01/18 1734 11/01/18 1857     Subjective: Pain is persistent.   Not much better.  Objective: Vitals:   11/02/18 1339 11/02/18 2121 11/02/18 2300 11/03/18 0546  BP: 135/85 100/72 112/75 92/68  Pulse: (!) 138 (!) 125  (!) 110  Resp: (!) 22 (!) 22  (!) 22  Temp: 98.5 F (36.9 C) 98.4 F (36.9 C)  97.6 F (36.4 C)  TempSrc:  Oral  Oral  SpO2: 99% 99%  98%  Weight:     45.8 kg  Height:        Intake/Output Summary (Last 24 hours) at 11/03/2018 1227 Last data filed at 11/03/2018 0900 Gross per 24 hour  Intake 1676.4 ml  Output 2000 ml  Net -323.6 ml   Filed Weights   11/02/18 0032 11/02/18 0500 11/03/18 0546  Weight: 44.5 kg 45.3 kg 45.8 kg    Examination:  General exam: Appears calm and comfortable.  Thin. Respiratory system: Clear to auscultation. Respiratory effort normal. Cardiovascular system: S1 & S2 heard, RRR.  Gastrointestinal system: Abdomen is nondistended, soft and diffusely TTP.  PEG tube in place. Central nervous system: Alert and oriented. No focal neurological deficits. Extremities: no LEE Skin: No rashes, lesions or ulcers Psychiatry: Judgement and insight appear normal. Mood & affect appropriate.     Data Reviewed: I have personally reviewed following labs and imaging studies  CBC: Recent Labs  Lab 11/01/18 1737 11/02/18 0432 11/03/18 0530  WBC 15.8* 8.6 9.6  NEUTROABS 13.5*  --   --   HGB 9.2* 7.5* 7.5*  HCT 28.8* 24.9* 24.9*  MCV 96.0 99.6 99.6  PLT 946* 612* 620*   Basic Metabolic Panel: Recent Labs  Lab 11/01/18 1737 11/02/18 0432 11/03/18 0530  NA 131* 137 138  K 3.8 3.6 3.3*  CL 96* 103 101  CO2 GLUCOSE 330* 115* 111*  BUN 27* 24* 23*  CREATININE 0.37* <0.30* <0.30*  CALCIUM 9.3 8.5* 7.8*  MG 1.8  --   --    GFR: CrCl cannot be calculated (This lab value cannot be used to calculate CrCl because it is not Rudell Marlowe number: <0.30). Liver Function Tests: Recent Labs  Lab 11/01/18 1737 11/02/18 0432 11/03/18 0530  AST 57* 25 20  ALT 132* 86* 55*  ALKPHOS 310* 212* 151*  BILITOT 0.6 0.7 0.2*  PROT 6.9 5.4* 4.8*  ALBUMIN 2.4* 1.9* 1.7*   Recent Labs  Lab 11/01/18 1737 11/03/18 0530  LIPASE 803* 262*   No results for input(s): AMMONIA in the last 168 hours. Coagulation Profile: No results for input(s): INR, PROTIME in the last 168 hours. Cardiac Enzymes: No results for input(s):  CKTOTAL, CKMB, CKMBINDEX, TROPONINI in the last 168 hours. BNP (last 3 results) No results for input(s): PROBNP in the last 8760 hours. HbA1C: No results for input(s): HGBA1C in the last 72 hours. CBG: No results for input(s): GLUCAP in the last 168 hours. Lipid Profile: No results for input(s): CHOL, HDL, LDLCALC, TRIG, CHOLHDL, LDLDIRECT in the last 72 hours. Thyroid Function Tests: Recent Labs    11/01/18 1722  TSH 1.800   Anemia Panel: No results for input(s): VITAMINB12, FOLATE, FERRITIN, TIBC, IRON, RETICCTPCT in the last 72 hours. Sepsis Labs: Recent Labs  Lab 11/01/18 1737  LATICACIDVEN 1.4    Recent Results (from the past 240 hour(s))  Blood culture (routine x 2)     Status: None (Preliminary result)   Collection Time: 11/01/18  5:37 PM  Result Value Ref Range Status   Specimen Description BLOOD LEFT HAND  Final   Special Requests   Final    BOTTLES DRAWN AEROBIC AND ANAEROBIC Blood Culture results may not be optimal due to an excessive volume of blood received in culture bottles   Culture   Final    NO GROWTH < 24 HOURS Performed at [facility], [address] [place], [place] [address]    Report Status PENDING  Incomplete  Blood culture (routine x 2)     Status: None (Preliminary result)   Collection Time: 11/01/18  6:00 PM  Result Value Ref Range Status   Specimen Description BLOOD RIGHT ANTECUBITAL  Final   Special Requests   Final    BOTTLES DRAWN AEROBIC AND ANAEROBIC Blood Culture adequate volume   Culture   Final    NO GROWTH < 24 HOURS Performed at [facility], [address] [place], [place] [address]    Report Status PENDING  Incomplete  SARS Coronavirus 2 (CEPHEID- Performed in [facility] hospital lab), Hosp Order     Status: None   Collection Time: 11/01/18  6:36 PM  Result Value Ref Range Status   SARS Coronavirus 2 NEGATIVE NEGATIVE Final    Comment: (NOTE) If result is NEGATIVE SARS-CoV-2 target nucleic acids are NOT DETECTED. The  SARS-CoV-2 RNA is generally detectable in upper and lower  respiratory specimens during the acute phase of infection. The lowest  concentration of SARS-CoV-2 viral copies this assay can detect is 250  copies / mL. Ramsay Bognar negative result does not preclude SARS-CoV-2 infection  and should not be used as the sole basis for treatment or other  patient management decisions.  Ifeoluwa Beller negative result may occur with  improper specimen collection / handling, submission of specimen other  than nasopharyngeal swab, presence of viral mutation(s) within the  areas targeted by this assay, and inadequate number of viral copies  (<250 copies / mL). Kiaya Haliburton negative result must be combined with clinical  observations, patient history, and epidemiological information. If result is POSITIVE SARS-CoV-2 target nucleic acids are DETECTED. The SARS-CoV-2 RNA is generally detectable in upper and lower  respiratory specimens dur ing the acute phase of infection.  Positive  results are indicative of active infection with SARS-CoV-2.  Clinical  correlation with patient history and other diagnostic information is  necessary to determine patient infection status.  Positive results do  not rule out bacterial infection or co-infection with other viruses. If result is PRESUMPTIVE POSTIVE SARS-CoV-2 nucleic acids MAY BE PRESENT.   Rashaunda Rahl presumptive positive result was obtained on the submitted specimen  and confirmed on repeat testing.  While 2019 novel coronavirus  (SARS-CoV-2) nucleic acids may be present in the submitted sample  additional confirmatory testing may be necessary for epidemiological  and / or clinical management purposes  to differentiate between  SARS-CoV-2 and other Sarbecovirus currently known to infect humans.  If clinically indicated additional testing with an alternate test  methodology [phone] is advised. The SARS-CoV-2 RNA is generally  detectable in upper and lower respiratory sp ecimens during the acute    phase of infection. The expected result is Negative. Fact Sheet for Patients:  BoilerBrush.com.cyhttps://www.fda.gov/media/136312/download Fact Sheet for Healthcare Providers: https://pope.com/https://www.fda.gov/media/136313/download This test is not yet approved or cleared by the [place] FDA and has been authorized for detection and/or diagnosis of SARS-CoV-2 by FDA under an Emergency Use Authorization (EUA).  This EUA will remain in effect (meaning this test can be used) for the duration of the COVID-19 declaration under Section 564(b)(1) of the Act, 21 U.S.C. section 360bbb-3(b)(1), unless the authorization is terminated or revoked sooner. Performed at [facility], [address] [place], [place] [address]          Radiology Studies: Ct Abdomen Pelvis W Contrast  Result Date: 11/01/2018 CLINICAL DATA:  Generalized abdominal pain EXAM: CT ABDOMEN AND PELVIS WITH CONTRAST TECHNIQUE: Multidetector CT imaging of the abdomen and pelvis was performed using the standard protocol following bolus administration of intravenous contrast. CONTRAST:  100mL OMNIPAQUE IOHEXOL 300 MG/ML  SOLN COMPARISON:  CT abdomen pelvis 10/14/2018 FINDINGS: LOWER CHEST: Small pleural effusions and bibasilar atelectasis. HEPATOBILIARY: The hepatic contours and density are normal. There is no intra- or extrahepatic biliary dilatation. The gallbladder is normal. PANCREAS: There are multiple peripancreatic cystic lesions. The most superior, near the gastro esophageal junction, now measures 1.7 cm, previously 2.5 cm. Collection along the anterior pancreatic body measures 3.3 x 3.2 cm, previously 2.4 x 1.8 cm. Collection of the pancreatic head measures 2.7 x 2.0 cm, previously 4.0 x 2.9 cm. Chronic atrophy of the pancreatic tail and distal body. SPLEEN: Normal. ADRENALS/URINARY TRACT: --Adrenal glands: Normal. --Right kidney/ureter: No hydronephrosis, nephroureterolithiasis, perinephric stranding or solid renal mass. --Left kidney/ureter: No  hydronephrosis, nephroureterolithiasis, perinephric stranding or solid renal mass. --Urinary bladder: Markedly dilated. STOMACH/BOWEL: --Stomach/Duodenum: Gastrostomy tube is present within the stomach. --Small bowel: No dilatation or inflammation. --Colon: The cecum is markedly stool distended. --Appendix: Normal. VASCULAR/LYMPHATIC: Normal course and caliber of the major abdominal vessels. No abdominal or pelvic lymphadenopathy. REPRODUCTIVE: Normal uterus and ovaries. Small  amount of free fluid in the pelvis. MUSCULOSKELETAL. No bony spinal canal stenosis or focal osseous abnormality. OTHER: None. IMPRESSION: 1. Multiple upper abdominal fluid collections, including collections in the pancreatic head and body and at the gastroesophageal junction, with mixed changes in size; the pancreatic body collection showing the greatest growth and the pancreatic head collection showing the greatest decrease. These collections are most consistent with pancreatic pseudocysts. No specific findings of infection, though this would be difficult to entirely exclude. 2. Distended and stool-filled cecum. 3. Markedly distended urinary bladder without hydronephrosis. 4. Decreased density of fluid in the posterior cul-de-sac, likely resolving hemoperitoneum. Electronically Signed   By: Deatra Robinson M.D.   On: 11/01/2018 22:05   Dg Chest Portable 1 View  Result Date: 11/01/2018 CLINICAL DATA:  Fever EXAM: PORTABLE CHEST 1 VIEW COMPARISON:  Chest x-ray dated 10/09/2014 FINDINGS: Cardiac silhouette is enlarged. There is blunting of the right costophrenic angle with Clem Wisenbaker right basilar airspace opacity. There is no pneumothorax. No large pleural effusion. There may be generalized volume overload. There is nonspecific gaseous distention of the partially visualized stomach. IMPRESSION: 1. Blunting of the right costophrenic angle favored to be secondary to Oria Klimas combination of Paxton Kanaan trace right-sided effusion and atelectasis. An infiltrate or  aspiration is not excluded. 2. Mild cardiomegaly. Electronically Signed   By: Katherine Mantle M.D.   On: 11/01/2018 18:23        Scheduled Meds:  calcium carbonate (dosed in mg elemental calcium)  1,000 mg of elemental calcium Per Tube TID WC   feeding supplement (PRO-STAT SUGAR FREE 64)  30 mL Per Tube Daily   folic acid  1 mg Per Tube Daily   heparin  5,000 Units Subcutaneous Q8H    HYDROmorphone (DILAUDID) injection  1 mg Intravenous Q6H   imipramine  10 mg Per Tube QHS   mouth rinse  15 mL Mouth Rinse BID   nicotine  14 mg Transdermal Daily   ondansetron (ZOFRAN) IV  4 mg Intravenous TID WC & HS   pantoprazole sodium  40 mg Per Tube BID AC   polyethylene glycol  17 g Oral BID   sertraline  50 mg Per Tube Daily   sodium chloride flush  3 mL Intravenous Q12H   thiamine  100 mg Per Tube Daily   Continuous Infusions:  sodium chloride     ceFEPime (MAXIPIME) IV 2 g (11/03/18 0300)   feeding supplement (OSMOLITE 1.5 CAL) 40 mL/hr at 11/02/18 1130   lactated ringers with kcl 100 mL/hr at 11/02/18 2141   metronidazole 500 mg (11/03/18 1203)     LOS: 2 days    Time spent: over 30 min    Lacretia Nicks, MD [facility] Pager AMION  If 7PM-7AM, please contact night-coverage [email or web address] Password [facility] 11/03/2018, 12:27 PM

## 2018-11-03 NOTE — Progress Notes (Addendum)
  Assessment/Plan: ADMITTED WITH ACUTE PANCREATITIS. CLINICALLY IMPROVED. NAUSEA PERSISTS. PAIN SAME. LIPASE BETTER. NO BM SINCE WED OR THUR. RESOLVING HEMOPERITONEUM ON CT.  PLAN: 1. ADD ZOFRAN ATC. 2. CONTINUE PAIN MEDS. 3. RE-START TUBE FEEDS. 4. TRY MIRALAX IN 4 OZ WATER AND NOT 8 OZ. 5. STOP CREON. 6. I PERSONALLY REVIEWED THE CT WITH DR. Ardelle Anton: ETIOLOGY OF HEMOPERITONEUM UNKNOWN BUT RESOLVING, NO BLOOD IN UPPER ABDOMEN OR EVIDENCE FOR SPLENIC ARTERY ANEURYSM.    Subjective: Since I last evaluated the patient TUBE FEEDS BEEN ON HOLD SINCE @ 830 AM. WAS FEELING NAUSEATED. NO VOMITING. ABDOMINAL PAIN: SAME. NO BMs TODAY SO FAR. YESTERDAY NO BM. GOT 2 DOSES OF MIRALAX YESTERDAY. 3RD AND 4TH DOSE HELD. FOLEY PLACED YESTERDAY DUE TO URINARY RETENTION. WENT INTO AFIB LAST NIGHT AND SPONTANEOUSLY CONVERTED TO NSR. ECHO PENDING. CAN'T AFFORD TO TAKE CREON: $900.  Objective: Vital signs in last 24 hours: Vitals:   11/02/18 2300 11/03/18 0546  BP: 112/75 92/68  Pulse:  (!) 110  Resp:  (!) 22  Temp:  97.6 F (36.4 C)  SpO2:  98%   General appearance: alert and cooperative Resp: clear to auscultation bilaterally Cardio: regular rate and rhythm GI: soft, MODERATELY tender IN BUQ/EPIGASTRIUM, MILDLY TENDER IN RLQ, NO REBOUND OR GUARDING; bowel sounds normal;  NEURO: NO  NEW FOCAL DEFICITS  Lab Results:  LIPASE 262 K 3.3 Hb 7.5   Studies/Results: No results found.  Medications: I have reviewed the patient's current medications.

## 2018-11-03 NOTE — Progress Notes (Signed)
Patient still had urinary retention. I&O cath patient and 400 cc urine returned. Received order for foley catheter. Foley placed and patient tolerated well. Patient refused tube feeding at this time.  Patient states she is in too much pain for anything to be placed through tube.  MD made aware.  No new orders received at this time.

## 2018-11-03 NOTE — Progress Notes (Signed)
Patient states that she "got paranoid" and clamped her feeding tube and "doesn't want anything going down my tube". Patient stated that she "started coughing and got paranoid" she also stated that she "heard two men talking about work in the bed starting around 5 o'clock". Patient states that her pain is a 10/10 and is given the scheduled pain medications. Will attempt to resume feedings once patient is feeling more calm and comfortable.

## 2018-11-03 NOTE — Progress Notes (Signed)
*  PRELIMINARY RESULTS* Echocardiogram 2D Echocardiogram has been performed.  Monique Nguyen 11/03/2018, 9:29 AM

## 2018-11-03 NOTE — Progress Notes (Signed)
CCMD called to notify staff that patient had re-converted back to a NSR.

## 2018-11-04 LAB — FOLATE: Folate: 14.2 ng/mL (ref >5.9)

## 2018-11-04 LAB — VITAMIN B12: Vitamin B-12: 329 pg/mL (ref 180–914)

## 2018-11-04 LAB — COMPREHENSIVE METABOLIC PANEL
ALT: 40 U/L (ref 0–44)
AST: 19 U/L (ref 15–41)
Albumin: 1.6 g/dL — ABNORMAL LOW (ref 3.5–5.0)
Alkaline Phosphatase: 132 U/L — ABNORMAL HIGH (ref 38–126)
Anion gap: 11 (ref 5–15)
BUN: 22 mg/dL — ABNORMAL HIGH (ref 6–20)
CO2: 28 mmol/L (ref 22–32)
Calcium: 7.7 mg/dL — ABNORMAL LOW (ref 8.9–10.3)
Chloride: 98 mmol/L (ref 98–111)
Creatinine, Ser: 0.32 mg/dL — ABNORMAL LOW (ref 0.44–1.00)
GFR calc Af Amer: >60 mL/min (ref >60)
GFR calc non Af Amer: >60 mL/min (ref >60)
Glucose, Bld: 113 mg/dL — ABNORMAL HIGH (ref 70–99)
Potassium: 3.2 mmol/L — ABNORMAL LOW (ref 3.5–5.1)
Sodium: 137 mmol/L (ref 135–145)
Total Bilirubin: 0.2 mg/dL — ABNORMAL LOW (ref 0.3–1.2)
Total Protein: 4.6 g/dL — ABNORMAL LOW (ref 6.5–8.1)

## 2018-11-04 LAB — CBC
HCT: 22.6 % — ABNORMAL LOW (ref 36.0–46.0)
Hemoglobin: 7.1 g/dL — ABNORMAL LOW (ref 12.0–15.0)
MCH: 31 pg (ref 26.0–34.0)
MCHC: 31.4 g/dL (ref 30.0–36.0)
MCV: 98.7 fL (ref 80.0–100.0)
Platelets: 532 10*3/uL — ABNORMAL HIGH (ref 150–400)
RBC: 2.29 MIL/uL — ABNORMAL LOW (ref 3.87–5.11)
RDW: 15.7 % — ABNORMAL HIGH (ref 11.5–15.5)
WBC: 10 10*3/uL (ref 4.0–10.5)
nRBC: 0.2 % (ref 0.0–0.2)

## 2018-11-04 LAB — HEMOGLOBIN AND HEMATOCRIT, BLOOD
HCT: 24.1 % — ABNORMAL LOW (ref 36.0–46.0)
Hemoglobin: 7.3 g/dL — ABNORMAL LOW (ref 12.0–15.0)

## 2018-11-04 LAB — IRON AND TIBC
Iron: 16 ug/dL — ABNORMAL LOW (ref 28–170)
Saturation Ratios: 12 % (ref 10.4–31.8)
TIBC: 131 ug/dL — ABNORMAL LOW (ref 250–450)
UIBC: 115 ug/dL

## 2018-11-04 LAB — FERRITIN: Ferritin: 366 ng/mL — ABNORMAL HIGH (ref 11–307)

## 2018-11-04 LAB — MAGNESIUM: Magnesium: 1.6 mg/dL — ABNORMAL LOW (ref 1.7–2.4)

## 2018-11-04 MED ORDER — POLYETHYLENE GLYCOL 3350 17 G PO PACK
17.0000 g | PACK | Freq: Two times a day (BID) | ORAL | Status: DC
  Administered 2018-11-04 – 2018-11-05 (×3): 17 g
  Filled 2018-11-04 (×2): qty 1

## 2018-11-04 MED ORDER — MAGNESIUM SULFATE 2 GM/50ML IV SOLN
2.0000 g | Freq: Once | INTRAVENOUS | Status: AC
  Administered 2018-11-04: 08:00:00 2 g via INTRAVENOUS
  Filled 2018-11-04: qty 50

## 2018-11-04 MED ORDER — POTASSIUM CHLORIDE 20 MEQ PO PACK
40.0000 meq | PACK | ORAL | Status: AC
  Filled 2018-11-04: qty 2

## 2018-11-04 NOTE — Progress Notes (Signed)
Patient has tolerated tube feedings for majority of shift.   Tube feeding ran at 20cc until 11pm, then patient requested that they be turned off at that time.  Patient states she still has severe pain and is nauseated.  Patient states she will left staff know when she is ready to tolerate feedings again.

## 2018-11-04 NOTE — Progress Notes (Signed)
PROGRESS NOTE    Monique Nguyen  KGS:[id number] DOB: July 03, 1970 DOA: 11/01/2018 PCP: Patient, No Pcp Per   Brief Narrative:  48 year old female with Monique Nguyen history of alcohol induced chronic pancreatitis, failure to thrive, pancreatic pseudocyst, anxiety/depression, and chronic abdominal pain presenting with nausea, vomiting, and worsening abdominal pain for 2 to 3 days duration.  The patient was recently discharged from the hospital after Monique Nguyen long stay from 10/14/2018 through 10/23/2018.  During that hospital stay, the patient was treated for acute on chronic pancreatitis as well as failure to thrive.  EGD was performed on 10/16/2018 which was unremarkable and did not show gastric outlet obstruction.  She initially had an NJ tube, but when this was dislodged, gastrostomy tube was ultimately placed.  She was sent home with Osmolite enteral feedings.  She states that she has drank fluids by mouth, but has not put any thing of substance or solid by mouth.  She has some nausea when her gastrostomy tube is flushed, but has emesis occasionally when she takes any fluids by mouth.  Her last bowel movement was on 10/30/2018.  She states that there was formed.  She has not drank alcohol in nearly 2 months.  She has not smoked in over 1 month.  Patient states that she had Monique Nguyen fever of 101.1 F at home on 11/01/2018.  She denies any headache, sore throat, coughing, hemoptysis.  She has some intermittent shortness of breath.  She denies any new medications, but states that she has been taking 2 ibuprofen on Monique Nguyen daily basis.  [facility] nurse sent patient to ED for fever of 101.1 on 11/01/18. In the emergency department, the patient was afebrile hemodynamically stable saturating 100% on 2 L.  CT of the abdomen and pelvis showed multiple pancreatic cystic lesions consistent with pseudocysts located in the GE junction, pancreatic body, and pancreatic head.  The gastrostomy tube was in the appropriate position.  The cecum was markedly distended  with stool.  In addition, she had Monique Nguyen markedly distended urinary bladder without hydronephrosis.  WBC was 15.8 with lipase 803.  LFTs were unremarkable.  Urinalysis showed 11-20 WBC.  GI was consulted to assist with management.  Assessment & Plan:   Principal Problem:   Pancreatic pseudocyst Active Problems:   Depression   Anxiety   Pancreatic cyst   Alcohol-induced chronic pancreatitis (HCC)   Acute on chronic pancreatitis (HCC)   Adult failure to thrive   GERD (gastroesophageal reflux disease)   Chronic pancreatitis (HCC)   Protein-calorie malnutrition, severe   Pressure injury of skin   Generalized abdominal pain   Acute on chronic pancreatitis -Referral has been sent to [facility], Dr. Raylene Nguyen for EUS/ERCP - Appreciate GI c/s, Dr. Jonette Nguyen - creon has been d/c'd as pt unable to afford.  Restart tube feeds.  Miralax.  -Continue enteral feedings - pt asked these to be d/c'd overnight.  Will attempt to restart today. -Continue PPI -Continue IV fluids--change to LR -11/01/2018 CT abdomen and pelvis--small bilateral pleural effusions.  Multiple upper abdominal fluid collections, most consistent with pseudocysts.  (see report)   - lipase downtrending  Aspiration pneumonitis -Patient had numerous episodes of emesis -Personally reviewed chest x-ray--right basilar opacity -Continue cefepime and metronidazole -> narrow to ceftriaxone - plan for 5 days abx total -Procalcitonin pending collection  Pyuria -Concerning for UTI -Culture with diptheroids, suspect contaminant   Chronic abdominal pain -Multifactorial including constipation, urinary retention, acute on chronic pancreatitis -Continue hydromorphone per GI - will d/c scheduled  doses  -Continue MiraLAX   Failure to thrive -Continue enteral feeding -continue ProStat  Urinary retention - distended bladder seen on CT scan - follow bladder scans  Resolving Hemoperitoneum: seen on imaging, Hb seems relatively  stable.  Continue to monitor.  Nonspecific per 5/3 imaging report.  Unclear etiology. Recommended follow up pelvic ultrasound to follow.   Continue to monitor  Stage 1 sacral skin breakdown -present at time of admission -continue local wound care  Atrial Fibrillation: seen on EKG last 5/22-23 PM.  TSH is wnl.  repeat EKG 5/23 with NSR.  Continue tele. Follow echo (normal EF, see report below) Chadsvasc is 1.  No indication for anticoagulation at this point. Currently in sinus rhythm.  Hypokalemia  Hypomagnesemia: replace, follow  Anemia: chronic.  Follow iron panel, b12, folate.  Repeat Hb this PM.  DVT prophylaxis: heparin Code Status: full  Family Communication: none at bedside Disposition Plan: pending   Consultants:   GI  Procedures:  Echo IMPRESSIONS    1. The left ventricle has normal systolic function, with an ejection fraction of 55-60%. The cavity size was normal. Left ventricular diastolic parameters were normal. No evidence of left ventricular regional wall motion abnormalities.  2. The right ventricle has normal systolic function. The cavity was normal. There is no increase in right ventricular wall thickness. Right ventricular systolic pressure could not be assessed with an estimated pressure of 13.6 mmHg.  Antimicrobials:  Anti-infectives (From admission, onward)   Start     Dose/Rate Route Frequency Ordered Stop   11/03/18 1245  cefTRIAXone (ROCEPHIN) 1 g in sodium chloride 0.9 % 100 mL IVPB     1 g 200 mL/hr over 30 Minutes Intravenous Every 24 hours 11/03/18 1235     11/02/18 0430  metroNIDAZOLE (FLAGYL) IVPB 500 mg     500 mg 100 mL/hr over 60 Minutes Intravenous Every 8 hours 11/02/18 0205     11/02/18 0230  ceFEPIme (MAXIPIME) 2 g in sodium chloride 0.9 % 100 mL IVPB  Status:  Discontinued     2 g 200 mL/hr over 30 Minutes Intravenous Every 12 hours 11/02/18 0205 11/03/18 1235   11/01/18 2030  metroNIDAZOLE (FLAGYL) IVPB 500 mg     500 mg 100  mL/hr over 60 Minutes Intravenous  Once 11/01/18 2029 11/01/18 2151   11/01/18 1745  ceFEPIme (MAXIPIME) 1 g in sodium chloride 0.9 % 100 mL IVPB     1 g 200 mL/hr over 30 Minutes Intravenous  Once 11/01/18 1734 11/01/18 1953   11/01/18 1745  vancomycin (VANCOCIN) IVPB 1000 mg/200 mL premix     1,000 mg 200 mL/hr over 60 Minutes Intravenous  Once 11/01/18 1734 11/01/18 1857     Subjective: Notes things are stable Feels pain is the same.  Objective: Vitals:   11/03/18 2109 11/03/18 2109 11/04/18 0300 11/04/18 0513  BP: 116/71 116/71  (!) 104/59  Pulse: (!) 117 (!) 110  (!) 118  Resp:  18  18  Temp: 98.8 F (37.1 C) 98.8 F (37.1 C)  97.9 F (36.6 C)  TempSrc: Oral Oral  Oral  SpO2: 100% 100%  100%  Weight:   47.8 kg   Height:        Intake/Output Summary (Last 24 hours) at 11/04/2018 1032 Last data filed at 11/04/2018 0300 Gross per 24 hour  Intake 0 ml  Output 500 ml  Net -500 ml   Filed Weights   11/02/18 0500 11/03/18 0546 11/04/18 0300  Weight: 45.3 kg  45.8 kg 47.8 kg    Examination:  General: No acute distress. thin Cardiovascular: Heart sounds show Roza Creamer regular rate, and rhythm. Lungs: Clear to auscultation bilaterally Abdomen: Soft, diffusely tender.  Peg in place.  Nondistended. Neurological: Alert and oriented 3. Moves all extremities 4. Cranial nerves II through XII grossly intact. Skin: Warm and dry. No rashes or lesions. Extremities: No clubbing or cyanosis. No edema.   Data Reviewed: I have personally reviewed following labs and imaging studies  CBC: Recent Labs  Lab 11/01/18 1737 11/02/18 0432 11/03/18 0530 11/04/18 0619  WBC 15.8* 8.6 9.6 10.0  NEUTROABS 13.5*  --   --   --   HGB 9.2* 7.5* 7.5* 7.1*  HCT 28.8* 24.9* 24.9* 22.6*  MCV 96.0 99.6 99.6 98.7  PLT 946* 612* 620* 532*   Basic Metabolic Panel: Recent Labs  Lab 11/01/18 1737 11/02/18 0432 11/03/18 0530 11/04/18 0619  NA 131* 137 138 137  K 3.8 3.6 3.3* 3.2*  CL 96* 103 101  98  CO2 24 25 27 28   GLUCOSE 330* 115* 111* 113*  BUN 27* 24* 23* 22*  CREATININE 0.37* <0.30* <0.30* 0.32*  CALCIUM 9.3 8.5* 7.8* 7.7*  MG 1.8  --   --  1.6*   GFR: Estimated Creatinine Clearance: 64.9 mL/min (Olamide Lahaie) (by C-G formula based on SCr of 0.32 mg/dL (L)). Liver Function Tests: Recent Labs  Lab 11/01/18 1737 11/02/18 0432 11/03/18 0530 11/04/18 0619  AST 57* 25 20 19   ALT 132* 86* 55* 40  ALKPHOS 310* 212* 151* 132*  BILITOT 0.6 0.7 0.2* 0.2*  PROT 6.9 5.4* 4.8* 4.6*  ALBUMIN 2.4* 1.9* 1.7* 1.6*   Recent Labs  Lab 11/01/18 1737 11/03/18 0530  LIPASE 803* 262*   No results for input(s): AMMONIA in the last 168 hours. Coagulation Profile: No results for input(s): INR, PROTIME in the last 168 hours. Cardiac Enzymes: No results for input(s): CKTOTAL, CKMB, CKMBINDEX, TROPONINI in the last 168 hours. BNP (last 3 results) No results for input(s): PROBNP in the last 8760 hours. HbA1C: No results for input(s): HGBA1C in the last 72 hours. CBG: No results for input(s): GLUCAP in the last 168 hours. Lipid Profile: No results for input(s): CHOL, HDL, LDLCALC, TRIG, CHOLHDL, LDLDIRECT in the last 72 hours. Thyroid Function Tests: Recent Labs    11/01/18 1722  TSH 1.800   Anemia Panel: No results for input(s): VITAMINB12, FOLATE, FERRITIN, TIBC, IRON, RETICCTPCT in the last 72 hours. Sepsis Labs: Recent Labs  Lab 11/01/18 1737 11/03/18 1245  PROCALCITON  --  0.18  LATICACIDVEN 1.4  --     Recent Results (from the past 240 hour(s))  Blood culture (routine x 2)     Status: None (Preliminary result)   Collection Time: 11/01/18  5:37 PM  Result Value Ref Range Status   Specimen Description BLOOD LEFT HAND  Final   Special Requests   Final    BOTTLES DRAWN AEROBIC AND ANAEROBIC Blood Culture results may not be optimal due to an excessive volume of blood received in culture bottles   Culture   Final    NO GROWTH 3 DAYS Performed at [facility], [address] [place], [place] [id number]    Report Status PENDING  Incomplete  Blood culture (routine x 2)     Status: None (Preliminary result)   Collection Time: 11/01/18  6:00 PM  Result Value Ref Range Status   Specimen Description BLOOD RIGHT ANTECUBITAL  Final   Special Requests  Final    BOTTLES DRAWN AEROBIC AND ANAEROBIC Blood Culture adequate volume   Culture   Final    NO GROWTH 3 DAYS Performed at [facility], [address] [place], [place] [address]    Report Status PENDING  Incomplete  SARS Coronavirus 2 (CEPHEID- Performed in [facility] hospital lab), Hosp Order     Status: None   Collection Time: 11/01/18  6:36 PM  Result Value Ref Range Status   SARS Coronavirus 2 NEGATIVE NEGATIVE Final    Comment: (NOTE) If result is NEGATIVE SARS-CoV-2 target nucleic acids are NOT DETECTED. The SARS-CoV-2 RNA is generally detectable in upper and lower  respiratory specimens during the acute phase of infection. The lowest  concentration of SARS-CoV-2 viral copies this assay can detect is 250  copies / mL. Aaron Boeh negative result does not preclude SARS-CoV-2 infection  and should not be used as the sole basis for treatment or other  patient management decisions.  Hansel Devan negative result may occur with  improper specimen collection / handling, submission of specimen other  than nasopharyngeal swab, presence of viral mutation(s) within the  areas targeted by this assay, and inadequate number of viral copies  (<250 copies / mL). Kydan Shanholtzer negative result must be combined with clinical  observations, patient history, and epidemiological information. If result is POSITIVE SARS-CoV-2 target nucleic acids are DETECTED. The SARS-CoV-2 RNA is generally detectable in upper and lower  respiratory specimens dur ing the acute phase of infection.  Positive  results are indicative of active infection with SARS-CoV-2.  Clinical  correlation with patient history and other diagnostic information is  necessary to determine  patient infection status.  Positive results do  not rule out bacterial infection or co-infection with other viruses. If result is PRESUMPTIVE POSTIVE SARS-CoV-2 nucleic acids MAY BE PRESENT.   Maicie Vanderloop presumptive positive result was obtained on the submitted specimen  and confirmed on repeat testing.  While 2019 novel coronavirus  (SARS-CoV-2) nucleic acids may be present in the submitted sample  additional confirmatory testing may be necessary for epidemiological  and / or clinical management purposes  to differentiate between  SARS-CoV-2 and other Sarbecovirus currently known to infect humans.  If clinically indicated additional testing with an alternate test  methodology [id number]) is advised. The SARS-CoV-2 RNA is generally  detectable in upper and lower respiratory sp ecimens during the acute  phase of infection. The expected result is Negative. Fact Sheet for Patients:  BoilerBrush.com.cy Fact Sheet for Healthcare Providers: https://pope.com/ This test is not yet approved or cleared by the [place] FDA and has been authorized for detection and/or diagnosis of SARS-CoV-2 by FDA under an Emergency Use Authorization (EUA).  This EUA will remain in effect (meaning this test can be used) for the duration of the COVID-19 declaration under Section 564(b)(1) of the Act, 21 U.S.C. section 360bbb-3(b)(1), unless the authorization is terminated or revoked sooner. Performed at [facility], [address], [place], [place] [address]   Urine culture     Status: Abnormal   Collection Time: 11/01/18 11:15 PM  Result Value Ref Range Status   Specimen Description   Final    URINE, CATHETERIZED Performed at [facility], [address], [place], [place] [address]    Special Requests   Final    NONE Performed at [facility], [address], [place], [place] [address]    Culture (Barby Colvard)  Final    60,000 COLONIES/mL DIPHTHEROIDS(CORYNEBACTERIUM  SPECIES) Standardized susceptibility testing for this organism is not  available. Performed at [facility], [address], [place], [place] [address]    Report Status 11/03/2018 FINAL  Final         Radiology Studies: No results found.      Scheduled Meds:  calcium carbonate (dosed in mg elemental calcium)  1,000 mg of elemental calcium Per Tube TID WC   feeding supplement (PRO-STAT SUGAR FREE 64)  30 mL Per Tube Daily   folic acid  1 mg Per Tube Daily   heparin  5,000 Units Subcutaneous Q8H    HYDROmorphone (DILAUDID) injection  1 mg Intravenous Q6H   imipramine  10 mg Per Tube QHS   mouth rinse  15 mL Mouth Rinse BID   nicotine  14 mg Transdermal Daily   ondansetron (ZOFRAN) IV  4 mg Intravenous TID WC & HS   pantoprazole sodium  40 mg Per Tube BID AC   polyethylene glycol  17 g Oral BID   potassium chloride  40 mEq Per Tube Q4H   sertraline  50 mg Per Tube Daily   sodium chloride flush  3 mL Intravenous Q12H   thiamine  100 mg Per Tube Daily   Continuous Infusions:  sodium chloride     cefTRIAXone (ROCEPHIN)  IV 1 g (11/03/18 1432)   feeding supplement (OSMOLITE 1.5 CAL) 40 mL/hr at 11/02/18 1130   lactated ringers with kcl 100 mL/hr at 11/04/18 1029   metronidazole 500 mg (11/04/18 0359)     LOS: 3 days    Time spent: over 30 min    Lacretia Nicks, MD [facility] Pager AMION  If 7PM-7AM, please contact night-coverage [email or web address] Password [facility] 11/04/2018, 10:32 AM

## 2018-11-04 NOTE — Progress Notes (Signed)
  Assessment/Plan: ADMITTED WITH ACUTE PANCREATITIS. PAIN THE SAME. NAUSEA IMPROVED.  PLAN: 1. CONTINUE TUBE FEEDS. 2. ATTEMPT MIRALAX VIA PEG TODAY. 3. CONTINUE ZOFRAN ATC AND PRN.   Subjective: Since I last evaluated the patient CHEST AND ABDOMINAL PAIN ARE THE SAME.  NAUSEA A LITTLE BIT. NO MIRALAX YESTERDAY/NO BM. LOWER ABDOMINAL PAIN: ACHY/CRAMPY. TOP OF ABDOMEN : SHARP AND MOVES TO HER BACK.   Objective: Vital signs in last 24 hours: Vitals:   11/03/18 2109 11/04/18 0513  BP: 116/71 (!) 104/59  Pulse: (!) 110 (!) 118  Resp: 18 18  Temp: 98.8 F (37.1 C) 97.9 F (36.6 C)  SpO2: 100% 100%   Physical Exam  Constitutional: She is oriented to person, place, and time. No distress.  HENT:  Head: Normocephalic and atraumatic.  Cardiovascular: Normal rate and regular rhythm.  Pulmonary/Chest: Effort normal and breath sounds normal. No respiratory distress.  Abdominal: Soft. Bowel sounds are normal. She exhibits no distension. There is abdominal tenderness. There is no rebound and no guarding.  MILD RUQ & LLQ, MODERATE LUQ/EPIGASTRIUM  Neurological: She is alert and oriented to person, place, and time.  NO  NEW FOCAL DEFICITS   Lab Results:  K 3.2 Cr 0.32 wbc 10 Hb 7.1 plt ct 532  Studies/Results: No results found.  Medications: I have reviewed the patient's current medications.

## 2018-11-05 DIAGNOSIS — A419 Sepsis, unspecified organism: Secondary | ICD-10-CM

## 2018-11-05 DIAGNOSIS — R748 Abnormal levels of other serum enzymes: Secondary | ICD-10-CM

## 2018-11-05 LAB — COMPREHENSIVE METABOLIC PANEL
ALT: 32 U/L (ref 0–44)
AST: 19 U/L (ref 15–41)
Albumin: 1.7 g/dL — ABNORMAL LOW (ref 3.5–5.0)
Alkaline Phosphatase: 117 U/L (ref 38–126)
Anion gap: 10 (ref 5–15)
BUN: 13 mg/dL (ref 6–20)
CO2: 31 mmol/L (ref 22–32)
Calcium: 7.6 mg/dL — ABNORMAL LOW (ref 8.9–10.3)
Chloride: 98 mmol/L (ref 98–111)
Creatinine, Ser: <0.3 mg/dL — ABNORMAL LOW (ref 0.44–1.00)
Glucose, Bld: 152 mg/dL — ABNORMAL HIGH (ref 70–99)
Potassium: 3.2 mmol/L — ABNORMAL LOW (ref 3.5–5.1)
Sodium: 139 mmol/L (ref 135–145)
Total Bilirubin: 0.3 mg/dL (ref 0.3–1.2)
Total Protein: 4.6 g/dL — ABNORMAL LOW (ref 6.5–8.1)

## 2018-11-05 LAB — CBC
HCT: 25.3 % — ABNORMAL LOW (ref 36.0–46.0)
Hemoglobin: 7.8 g/dL — ABNORMAL LOW (ref 12.0–15.0)
MCH: 30 pg (ref 26.0–34.0)
MCHC: 30.8 g/dL (ref 30.0–36.0)
MCV: 97.3 fL (ref 80.0–100.0)
Platelets: 603 10*3/uL — ABNORMAL HIGH (ref 150–400)
RBC: 2.6 MIL/uL — ABNORMAL LOW (ref 3.87–5.11)
RDW: 15.6 % — ABNORMAL HIGH (ref 11.5–15.5)
WBC: 7.9 10*3/uL (ref 4.0–10.5)
nRBC: 0.8 % — ABNORMAL HIGH (ref 0.0–0.2)

## 2018-11-05 LAB — MAGNESIUM: Magnesium: 2.1 mg/dL (ref 1.7–2.4)

## 2018-11-05 MED ORDER — POTASSIUM CHLORIDE 2 MEQ/ML IV SOLN
INTRAVENOUS | Status: DC
  Administered 2018-11-05 – 2018-11-07 (×3): via INTRAVENOUS
  Filled 2018-11-05 (×6): qty 1000

## 2018-11-05 MED ORDER — SODIUM CHLORIDE 0.9 % IV SOLN
125.0000 mg | Freq: Once | INTRAVENOUS | Status: AC
  Administered 2018-11-05: 125 mg via INTRAVENOUS
  Filled 2018-11-05: qty 10

## 2018-11-05 MED ORDER — POTASSIUM CHLORIDE 10 MEQ/100ML IV SOLN
10.0000 meq | INTRAVENOUS | Status: AC
  Administered 2018-11-05 (×2): 10 meq via INTRAVENOUS
  Filled 2018-11-05: qty 100

## 2018-11-05 MED ORDER — KCL-LACTATED RINGERS 20 MEQ/L IV SOLN
INTRAVENOUS | Status: DC
  Filled 2018-11-05 (×2): qty 1000

## 2018-11-05 NOTE — Progress Notes (Signed)
   Assessment/Plan: ADMITTED WITH ACUTE ON CHRONIC PANCREATITIS. PAIN THE SAME. NAUSEA: SAME. CLINICALLY IMPROVED. TOLERATING INCREASE IN TUBE FEEDS.  PLAN: 1. SUPPORTIVE CARE. 2. NO MEDICATION ADJUSTMENTS TODAY.    Subjective: Since I last evaluated the patient PT'S TUBE FEEDS ARE UP TO 20 CC/HR WITH GOAL OF 40 CC.HR. HAD A BM x2 WITHIN THE LEAST 24 HRS. NAUSEA SAME. A LITTLE VOMITING x1. TOLERATED THE MIRALAX.  Objective: Vital signs in last 24 hours: Vitals:   11/05/18 0840 11/05/18 1354  BP:  107/74  Pulse:  (!) 109  Resp:  16  Temp:    SpO2: 91% 91%   Physical Exam  Constitutional: No distress.  HENT:  Head: Normocephalic and atraumatic.  Cardiovascular: Normal rate, regular rhythm and normal heart sounds.  Pulmonary/Chest: Effort normal and breath sounds normal. No respiratory distress.  Abdominal: Soft. Bowel sounds are normal. There is abdominal tenderness. There is no rebound and no guarding.  MODERATE TO SEVERE IN LUQ/EPIGASTRIM, MILD IN RUQ, RLQ, & LLQ.  Psychiatric:  FLAT AFFECT, NL MOOD   Lab Results:  K 3.2 Cr <0.30 Hb 7.8 PLT CT 603   Studies/Results: No results found.  Medications: I have reviewed the patient's current medications.

## 2018-11-05 NOTE — Progress Notes (Signed)
PROGRESS NOTE  Monique Nguyen ZOX:[id number] DOB: 1971/02/07 DOA: 11/01/2018 PCP: Patient, No Pcp Per  Brief History:  48 year old female with a history of alcohol induced chronic pancreatitis, failure to thrive, pancreatic pseudocyst, anxiety/depression, and chronic abdominal pain presenting with nausea, vomiting, and worsening abdominal pain for 2 to 3 days duration. The patient was recently discharged from the hospital after a long stay from 10/14/2018 through 10/23/2018. During that hospital stay, the patient was treated for acute on chronic pancreatitis as well as failure to thrive. EGD was performed on 10/16/2018 which was unremarkable and did not show gastric outlet obstruction. She initially had an NJ tube, but when this was dislodged, gastrostomy tube was ultimately placed. She was sent home with Osmolite enteral feedings.She states that she has drank fluids by mouth, but has not put any thing of substance or solid by mouth. She has some nausea when her gastrostomy tube is flushed, but has emesis occasionally when she takes any fluids by mouth. Her last bowel movement was on 10/30/2018. She states that there was formed. She has not drank alcohol in nearly 2 months. She has not smoked in over 1 month. Patient states that she had a fever of 101.1 F at home on 11/01/2018. She denies any headache, sore throat, coughing, hemoptysis. She has some intermittent shortness of breath. She denies any new medications, but states that she has been taking 2 ibuprofen on a daily basis.[facility] nurse sent patient to ED for fever of 101.1 on 11/01/18. In the emergency department, the patient was afebrile hemodynamically stable saturating 100% on 2 L. CT of the abdomen and pelvis showed multiple pancreatic cystic lesions consistent with pseudocysts located in the GE junction, pancreatic body, and pancreatic head. The gastrostomy tube was in the appropriate position. The cecum was markedly distended  with stool. In addition, she had a markedly distended urinary bladder without hydronephrosis.WBC was 15.8 with lipase 803. LFTs were unremarkable. Urinalysis showed 11-20 WBC. GI was consulted to assist with management.  Assessment/Plan: Acute on chronic pancreatitis -Referral has been sent to [facility], Dr. [name] for EUS/ERCP - Appreciate GI c/s, Dr. [name] - creon has been d/c'd as pt unable to afford.  Restart tube feeds.  Miralax.  -Continue enteral feedings - pt asked these to be d/c'd overnight 5/23-5/24 -Enteral feedings restarted 5/24--tolerating at 25cc/hr -Continue PPI -Continue IV fluids--change to LR--add 20 KCl -11/01/2018 CT abdomen and pelvis--small bilateral pleural effusions. Multiple upper abdominal fluid collections, most consistent with pseudocysts.  (see report)  - lipase downtrending [id number]  Aspiration pneumonitis -Patient had numerous episodes of emesis -Personally reviewed chest x-ray--right basilar opacity -Continue cefepime and metronidazole -> narrow to ceftriaxone - finished 5 days abx -Procalcitonin--0.18  Pyuria -Concerning for UTI -Culture with diptheroids, suspect contaminant   Chronic abdominal pain -Multifactorial including constipation, urinary retention, acute on chronic pancreatitis -Continue hydromorphoneperGI - will d/c scheduled doses  -Continue MiraLAX   Failure to thrive -Continue enteral feeding -continue ProStat  Urinary retention - distended bladder seen on CT scan - foley placed 11/03/18  Resolving Hemoperitoneum:  -seen on imaging 5/3 and 5/21,  -Hb seems relatively stable.   -Continue to monitor.  - Unclear etiology. -CTA abd if Hgb drops significantly -Continue to monitor  Stage 1 sacral skin breakdown -present at time of admission -continue local wound care  Atrial Fibrillation: seen on EKG last 5/22-23 PM.  TSH is wnl.  repeat EKG 5/23 with NSR.  Continue tele. -5/23 Echo--EF 55-60,  normal diastolic fxn, no WMA -Chadsvasc is 1.  No indication for anticoagulation at this point given short duration and acute medical condition -Currently in sinus rhythm.  Hypokalemia  Hypomagnesemia: replace, follow  Anemia of chronic Disease -iron saturation 12% -ferritin 366 -B12 329 -folate 14.2 -nulecit x 1       Disposition Plan:   Home when cleared by GI Family Communication:   No Family at bedside  Consultants:  GI  Code Status:  FULL   DVT Prophylaxis:  [place] Heparin   Procedures: As Listed in Progress Note Above  Antibiotics: Ceftriaxone 5/22>>> Flagyl 5/22>>>      Subjective: Pt states abd pain is better controlled today.  She is tolerating TF at 25cc hr.  No vomiting.  Had BM today.  No f/c cp, sob, dysuria.  Objective: Vitals:   11/04/18 2130 11/05/18 0600 11/05/18 0840 11/05/18 1354  BP: 102/71 118/81  107/74  Pulse: (!) 108 (!) 105  (!) 109  Resp: Temp: 98.6 F (37 C) 98.6 F (37 C)    TempSrc: Oral Oral    SpO2: 96% 93% 91% 91%  Weight:  49.2 kg    Height:        Intake/Output Summary (Last 24 hours) at 11/05/2018 1614 Last data filed at 11/05/2018 0955 Gross per 24 hour  Intake 1870.01 ml  Output 1201 ml  Net 669.01 ml   Weight change: 1.4 kg Exam:   General:  Pt is alert, follows commands appropriately, not in acute distress  HEENT: No icterus, No thrush, No neck mass, [place]/AT  Cardiovascular: RRR, S1/S2, no rubs, no gallops  Respiratory: bibasilar crackles, no wheeze  Abdomen: Soft/+BS, epigastri tender, non distended, no guarding  Extremities: trace LE edema, No lymphangitis, No petechiae, No rashes, no synovitis   Data Reviewed: I have personally reviewed following labs and imaging studies Basic Metabolic Panel: Recent Labs  Lab 11/01/18 1737 11/02/18 0432 11/03/18 0530 11/04/18 0619 11/05/18 0548  NA 131* 137 138 137 139  K 3.8 3.6 3.3* 3.2* 3.2*  CL 96* 103 101 98 98  CO2 GLUCOSE 330* 115* 111* 113* 152*  BUN 27* 24* 23* 22* 13  CREATININE 0.37* <0.30* <0.30* 0.32* <0.30*  CALCIUM 9.3 8.5* 7.8* 7.7* 7.6*  MG 1.8  --   --  1.6* 2.1   Liver Function Tests: Recent Labs  Lab 11/01/18 1737 11/02/18 0432 11/03/18 0530 11/04/18 0619 11/05/18 0548  AST 57* ALT 132* 86* 55* 40 32  ALKPHOS 310* 212* 151* 132* 117  BILITOT 0.6 0.7 0.2* 0.2* 0.3  PROT 6.9 5.4* 4.8* 4.6* 4.6*  ALBUMIN 2.4* 1.9* 1.7* 1.6* 1.7*   Recent Labs  Lab 11/01/18 1737 11/03/18 0530  LIPASE 803* 262*   No results for input(s): AMMONIA in the last 168 hours. Coagulation Profile: No results for input(s): INR, PROTIME in the last 168 hours. CBC: Recent Labs  Lab 11/01/18 1737 11/02/18 0432 11/03/18 0530 11/04/18 0619 11/04/18 1409 11/05/18 0548  WBC 15.8* 8.6 9.6 10.0  --  7.9  NEUTROABS 13.5*  --   --   --   --   --   HGB 9.2* 7.5* 7.5* 7.1* 7.3* 7.8*  HCT 28.8* 24.9* 24.9* 22.6* 24.1* 25.3*  MCV 96.0 99.6 99.6 98.7  --  97.3  PLT 946* 612* 620* 532*  --  603*   Cardiac Enzymes:  No results for input(s): CKTOTAL, CKMB, CKMBINDEX, TROPONINI in the last 168 hours. BNP: Invalid input(s): POCBNP CBG: No results for input(s): GLUCAP in the last 168 hours. HbA1C: No results for input(s): HGBA1C in the last 72 hours. Urine analysis:    Component Value Date/Time   COLORURINE AMBER (A) 11/01/2018 2315   APPEARANCEUR CLOUDY (A) 11/01/2018 2315   LABSPEC 1.036 (H) 11/01/2018 2315   PHURINE 8.0 11/01/2018 2315   GLUCOSEU >=500 (A) 11/01/2018 2315   HGBUR NEGATIVE 11/01/2018 2315   BILIRUBINUR NEGATIVE 11/01/2018 2315   KETONESUR NEGATIVE 11/01/2018 2315   PROTEINUR 100 (A) 11/01/2018 2315   UROBILINOGEN 0.2 03/06/2015 1558   NITRITE NEGATIVE 11/01/2018 2315   LEUKOCYTESUR NEGATIVE 11/01/2018 2315   Sepsis Labs: @LABRCNTIP (procalcitonin:4,lacticidven:4) ) Recent Results (from the past 240 hour(s))  Blood culture (routine x 2)     Status: None  (Preliminary result)   Collection Time: 11/01/18  5:37 PM  Result Value Ref Range Status   Specimen Description BLOOD LEFT HAND  Final   Special Requests   Final    BOTTLES DRAWN AEROBIC AND ANAEROBIC Blood Culture results may not be optimal due to an excessive volume of blood received in culture bottles   Culture   Final    NO GROWTH 4 DAYS Performed at [facility], [address] [place], [place] [address]    Report Status PENDING  Incomplete  Blood culture (routine x 2)     Status: None (Preliminary result)   Collection Time: 11/01/18  6:00 PM  Result Value Ref Range Status   Specimen Description BLOOD RIGHT ANTECUBITAL  Final   Special Requests   Final    BOTTLES DRAWN AEROBIC AND ANAEROBIC Blood Culture adequate volume   Culture   Final    NO GROWTH 4 DAYS Performed at [facility], [address] [place], [place] [address]    Report Status PENDING  Incomplete  SARS Coronavirus 2 (CEPHEID- Performed in [facility] hospital lab), Hosp Order     Status: None   Collection Time: 11/01/18  6:36 PM  Result Value Ref Range Status   SARS Coronavirus 2 NEGATIVE NEGATIVE Final    Comment: (NOTE) If result is NEGATIVE SARS-CoV-2 target nucleic acids are NOT DETECTED. The SARS-CoV-2 RNA is generally detectable in upper and lower  respiratory specimens during the acute phase of infection. The lowest  concentration of SARS-CoV-2 viral copies this assay can detect is 250  copies / mL. A negative result does not preclude SARS-CoV-2 infection  and should not be used as the sole basis for treatment or other  patient management decisions.  A negative result may occur with  improper specimen collection / handling, submission of specimen other  than nasopharyngeal swab, presence of viral mutation(s) within the  areas targeted by this assay, and inadequate number of viral copies  (<250 copies / mL). A negative result must be combined with clinical  observations, patient history, and  epidemiological information. If result is POSITIVE SARS-CoV-2 target nucleic acids are DETECTED. The SARS-CoV-2 RNA is generally detectable in upper and lower  respiratory specimens dur ing the acute phase of infection.  Positive  results are indicative of active infection with SARS-CoV-2.  Clinical  correlation with patient history and other diagnostic information is  necessary to determine patient infection status.  Positive results do  not rule out bacterial infection or co-infection with other viruses. If result is PRESUMPTIVE POSTIVE SARS-CoV-2 nucleic acids MAY BE PRESENT.   A presumptive positive result  was obtained on the submitted specimen  and confirmed on repeat testing.  While 2019 novel coronavirus  (SARS-CoV-2) nucleic acids may be present in the submitted sample  additional confirmatory testing may be necessary for epidemiological  and / or clinical management purposes  to differentiate between  SARS-CoV-2 and other Sarbecovirus currently known to infect humans.  If clinically indicated additional testing with an alternate test  methodology [phone]) is advised. The SARS-CoV-2 RNA is generally  detectable in upper and lower respiratory sp ecimens during the acute  phase of infection. The expected result is Negative. Fact Sheet for Patients:  BoilerBrush.com.cy Fact Sheet for Healthcare Providers: https://pope.com/ This test is not yet approved or cleared by the [place] FDA and has been authorized for detection and/or diagnosis of SARS-CoV-2 by FDA under an Emergency Use Authorization (EUA).  This EUA will remain in effect (meaning this test can be used) for the duration of the COVID-19 declaration under Section 564(b)(1) of the Act, 21 U.S.C. section 360bbb-3(b)(1), unless the authorization is terminated or revoked sooner. Performed at [facility] [address], [place], [place] [address]   Urine culture      Status: Abnormal   Collection Time: 11/01/18 11:15 PM  Result Value Ref Range Status   Specimen Description   Final    URINE, CATHETERIZED Performed at [facility], [address], [place], [place] [address]    Special Requests   Final    NONE Performed at [facility], [address] [place], [place] [address]    Culture (A)  Final    60,000 COLONIES/mL DIPHTHEROIDS(CORYNEBACTERIUM SPECIES) Standardized susceptibility testing for this organism is not available. Performed at [facility], [address], [place], [place] [address]    Report Status 11/03/2018 FINAL  Final     Scheduled Meds:  calcium carbonate (dosed in mg elemental calcium)  1,000 mg of elemental calcium Per Tube TID WC   feeding supplement (PRO-STAT SUGAR FREE 64)  30 mL Per Tube Daily   folic acid  1 mg Per Tube Daily   heparin  5,000 Units Subcutaneous Q8H   imipramine  10 mg Per Tube QHS   mouth rinse  15 mL Mouth Rinse BID   nicotine  14 mg Transdermal Daily   ondansetron (ZOFRAN) IV  4 mg Intravenous TID WC & HS   pantoprazole sodium  40 mg Per Tube BID AC   sertraline  50 mg Per Tube Daily   sodium chloride flush  3 mL Intravenous Q12H   thiamine  100 mg Per Tube Daily   Continuous Infusions:  sodium chloride     feeding supplement (OSMOLITE 1.5 CAL) 1,000 mL (11/05/18 0547)   lactated ringers with KCl 20 mEq/L     metronidazole 500 mg (11/05/18 1156)    Procedures/Studies: Ct Abdomen Pelvis W Contrast  Result Date: 11/01/2018 CLINICAL DATA:  Generalized abdominal pain EXAM: CT ABDOMEN AND PELVIS WITH CONTRAST TECHNIQUE: Multidetector CT imaging of the abdomen and pelvis was performed using the standard protocol following bolus administration of intravenous contrast. CONTRAST:  OMNIPAQUE IOHEXOL 300 MG/ML  SOLN COMPARISON:  CT abdomen pelvis 10/14/2018 FINDINGS: LOWER CHEST: Small pleural effusions and bibasilar atelectasis. HEPATOBILIARY: The hepatic contours and density  are normal. There is no intra- or extrahepatic biliary dilatation. The gallbladder is normal. PANCREAS: There are multiple peripancreatic cystic lesions. The most superior, near the gastro esophageal junction, now measures 1.7 cm, previously 2.5 cm. Collection along the anterior pancreatic body measures 3.3  x 3.2 cm, previously 2.4 x 1.8 cm. Collection of the pancreatic head measures 2.7 x 2.0 cm, previously 4.0 x 2.9 cm. Chronic atrophy of the pancreatic tail and distal body. SPLEEN: Normal. ADRENALS/URINARY TRACT: --Adrenal glands: Normal. --Right kidney/ureter: No hydronephrosis, nephroureterolithiasis, perinephric stranding or solid renal mass. --Left kidney/ureter: No hydronephrosis, nephroureterolithiasis, perinephric stranding or solid renal mass. --Urinary bladder: Markedly dilated. STOMACH/BOWEL: --Stomach/Duodenum: Gastrostomy tube is present within the stomach. --Small bowel: No dilatation or inflammation. --Colon: The cecum is markedly stool distended. --Appendix: Normal. VASCULAR/LYMPHATIC: Normal course and caliber of the major abdominal vessels. No abdominal or pelvic lymphadenopathy. REPRODUCTIVE: Normal uterus and ovaries. Small amount of free fluid in the pelvis. MUSCULOSKELETAL. No bony spinal canal stenosis or focal osseous abnormality. OTHER: None. IMPRESSION: 1. Multiple upper abdominal fluid collections, including collections in the pancreatic head and body and at the gastroesophageal junction, with mixed changes in size; the pancreatic body collection showing the greatest growth and the pancreatic head collection showing the greatest decrease. These collections are most consistent with pancreatic pseudocysts. No specific findings of infection, though this would be difficult to entirely exclude. 2. Distended and stool-filled cecum. 3. Markedly distended urinary bladder without hydronephrosis. 4. Decreased density of fluid in the posterior cul-de-sac, likely resolving hemoperitoneum.  Electronically Signed   By: Deatra Robinson M.D.   On: 11/01/2018 22:05   Ct Abdomen Pelvis W Contrast  Result Date: 10/14/2018 CLINICAL DATA:  Inpatient. Failure to thrive. Chronic abdominal pain. Difficulty swallowing with sensation of food sticking in throat. Weight loss. History of pancreatitis. EXAM: CT ABDOMEN AND PELVIS WITH CONTRAST TECHNIQUE: Multidetector CT imaging of the abdomen and pelvis was performed using the standard protocol following bolus administration of intravenous contrast. CONTRAST:  30mL OMNIPAQUE IOHEXOL 300 MG/ML SOLN, 75mL OMNIPAQUE IOHEXOL 300 MG/ML SOLN COMPARISON:  10/14/2018 abdominal radiographs. 08/04/2017 CT abdomen/pelvis. FINDINGS: Motion degraded scan, partially limiting assessment. Lower chest: Right middle lobe 2 mm solid pulmonary nodule (series 5/image 1), not previously imaged. Additional tiny 2 mm pulmonary nodules at both lung bases are unchanged from 2017 CT, considered benign. Irregular thick walled 2.5 x 1.7 x 3.2 cm fluid collection along the anterior margin of the lower thoracic esophagus (series 2/image 11), new since 08/04/2017 CT. Similar irregular thick walled curvilinear fluid collection posterior to the esophagogastric junction extending into the right retrocrural region measuring 3.2 x 1.5 x 4.1 cm (series 2/image 17), new. Hepatobiliary: Normal liver size. No liver mass. Normal gallbladder with no radiopaque cholelithiasis. No biliary ductal dilatation. CBD diameter 5 mm. Pancreas: Chronic atrophy of distal body and tail of the pancreas with associated pancreatic duct dilation in these pancreatic segments, compatible with chronic pancreatitis. New thick-walled 4.1 x 2.9 x 3.5 cm pancreatic head cystic lesion (series 2/image 26). Posterior pancreatic head 1.0 x 0.8 cm cystic lesion (series 2/image 28), mildly increased from 0.7 x 0.6 cm. New clustered cystic pancreatic lesions in the pancreatic body, largest 2.4 x 1.7 cm anteriorly (series 2/image 21). No  significant acute peripancreatic fat stranding. Spleen: Normal size. No mass. Adrenals/Urinary Tract: Normal adrenals. Simple 1.1 cm upper left renal cyst. Otherwise normal kidneys, with no hydronephrosis. Chronic mild diffuse bladder wall thickening with moderate bladder distention. Stomach/Bowel: Mild wall thickening throughout the esophagogastric junction. Irregular thick walled fluid collection along the posterior margin of the antropyloric stomach measuring approximately 3.7 x 2.0 x 5.1 cm (series 2/image 31), new, exerting mild extrinsic mass-effect on the distal stomach. Stomach is not significantly distended. Normal caliber small bowel with no  small bowel wall thickening. Oral contrast transits to the distal small bowel. Normal appendix. Normal large bowel with no diverticulosis, large bowel wall thickening or pericolonic fat stranding. Vascular/Lymphatic: Atherosclerotic nonaneurysmal abdominal aorta. Patent hepatic, portal and renal veins. Diminutive splenic vein, which remains patent, unchanged. No pathologically enlarged lymph nodes in the abdomen or pelvis. New ill-defined curvilinear soft tissue in the central mesentery, which appears to communicate with the posterior distal gastric fluid collection. Reproductive: Grossly normal uterus. No discrete adnexal mass. Small volume hemoperitoneum in the pelvic cul-de-sac. Other: No pneumoperitoneum.  No abdominal ascites. Musculoskeletal: No aggressive appearing focal osseous lesions. IMPRESSION: 1. 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 Redemonstrated findings of chronic pancreatitis in the distal body and tail of the pancreas. 2. New irregular thick walled fluid collections encasing the esophagogastric junction anteriorly and posteriorly with associated wall thickening throughout the esophagogastric junction. Similar new irregular thick walled fluid  collection along the posterior margin of the antropyloric stomach with some mass effect on the distal stomach, which is not significantly distended. These nonspecific findings also presumably represent pancreatic pseudocysts with reactive wall thickening at the esophagogastric junction and distal stomach. Infected collections cannot be excluded. 3. Close CT or MRI abdomen follow-up imaging advised. 4. No biliary ductal dilatation.  No radiopaque cholelithiasis. 5. Nonspecific small volume hemoperitoneum in the pelvic cul-de-sac. No discrete adnexal mass. Suggest follow-up pelvic ultrasound on a short-term basis when clinically feasible. 6. Nonspecific chronic diffuse bladder wall thickening. Moderate bladder distention. No hydronephrosis. 7. Right middle lobe 2 mm solid pulmonary nodule, not previously imaged. No follow-up needed if patient is low-risk. Non-contrast chest CT can be considered in 12 months if patient is high-risk. This recommendation follows the consensus statement: Guidelines for Management of Incidental Pulmonary Nodules Detected on CT Images:From the Fleischner Society 2017; published online before print (10.1148/radiol.[id number]). 8.  Aortic Atherosclerosis (ICD10-I70.0). Electronically Signed   By: Delbert Phenix M.D.   On: 10/14/2018 12:49   Dg Chest Portable 1 View  Result Date: 11/01/2018 CLINICAL DATA:  Fever EXAM: PORTABLE CHEST 1 VIEW COMPARISON:  Chest x-ray dated 10/09/2014 FINDINGS: Cardiac silhouette is enlarged. There is blunting of the right costophrenic angle with a right basilar airspace opacity. There is no pneumothorax. No large pleural effusion. There may be generalized volume overload. There is nonspecific gaseous distention of the partially visualized stomach. IMPRESSION: 1. Blunting of the right costophrenic angle favored to be secondary to a combination of a trace right-sided effusion and atelectasis. An infiltrate or aspiration is not excluded. 2. Mild cardiomegaly.  Electronically Signed   By: Katherine Mantle M.D.   On: 11/01/2018 18:23   Dg Abdomen Acute W/chest  Result Date: 10/14/2018 CLINICAL DATA:  Vomiting EXAM: DG ABDOMEN ACUTE W/ 1V CHEST COMPARISON:  08/04/2017 CT abdomen/pelvis. 10/09/2014 chest radiograph. FINDINGS: Stable cardiomediastinal silhouette with normal heart size. No pneumothorax. No pleural effusion. Lungs appear clear, with no acute consolidative airspace disease and no pulmonary edema. No dilated small bowel loops or air-fluid levels. Moderate gas and mild stool throughout the colon. No evidence of pneumatosis or pneumoperitoneum. No radiopaque nephrolithiasis. IMPRESSION: 1. No active cardiopulmonary disease. 2. Nonobstructive bowel gas pattern. Moderate gas and mild stool in the colon. Electronically Signed   By: Delbert Phenix M.D.   On: 10/14/2018 09:25   Dg Vangie Bicker G Tube Plc W/fl W/rad  Result Date: 10/17/2018 CLINICAL DATA:  Feeding tube placement, chronic pancreatitis, weight loss, failure  to thrive EXAM: NASO G TUBE PLACEMENT WITH FL AND WITH RAD CONTRAST:  15 mL ISOVUE-300 IOPAMIDOL (ISOVUE-300) INJECTION 61% FLUOROSCOPY TIME:  Fluoroscopy Time:  3 minutes 12 seconds Radiation Exposure Index (if provided by the fluoroscopic device): 33.9 mGy Number of Acquired Spot Images: Single cine fluoroscopic series COMPARISON:  CT abdomen and pelvis 10/14/2018 FINDINGS: Pharynx was anesthetized with Cetacaine spray. Utilizing lubricant, a feeding tube was passed through the RIGHT nasal passages in the hypopharynx. With patient swallowing and neck flexed, feeding tube was advanced into the thoracic esophagus and down into the stomach. Catheter was manipulated and placed across the pylorus into the duodenum. Catheter was advanced to the level of ligament of Treitz. Contrast injection confirms presence of the catheter tip at the ligament of Treitz. IMPRESSION: Placement of nasogastric feeding tube to the ligament of Treitz. Electronically Signed   By:  Ulyses Southward M.D.   On: 10/17/2018 18:07   Dg Esophagus W Single Cm (sol Or Thin Ba)  Result Date: 10/15/2018 CLINICAL DATA:  Dysphagia, lower esophageal and epigastric pain EXAM: ESOPHOGRAM / BARIUM SWALLOW / BARIUM TABLET STUDY TECHNIQUE: Combined double contrast and single contrast examination performed using effervescent crystals, thick barium liquid, and thin barium liquid. The patient was observed with fluoroscopy swallowing a 13 mm barium sulphate tablet. FLUOROSCOPY TIME:  Fluoroscopy Time:  1 minutes 30 seconds Radiation Exposure Index (if provided by the fluoroscopic device): 17.9 mGy Number of Acquired Spot Images: multiple fluoroscopic screen captures COMPARISON:  None FINDINGS: Esophageal distention: Normal without mass or stricture Filling defects:  None 12.5 mm barium tablet: Easily passed from oral cavity to stomach without obstruction. Motility:  Normal Mucosa:  Smooth without irregularity or ulceration Hypopharynx/cervical esophagus: Normal motion without laryngeal penetration or aspiration Hiatal hernia:  Absent GE reflux:  Not identified during exam Other:  N/A IMPRESSION: Unremarkable esophagram. Electronically Signed   By: Ulyses Southward M.D.   On: 10/15/2018 15:06    Catarina Hartshorn, DO  [facility] Pager [phone]  If 7PM-7AM, please contact night-coverage [email or web address] Password [email or web address] 11/05/2018, 4:14 PM   LOS: 4 days

## 2018-11-06 ENCOUNTER — Ambulatory Visit: Payer: Self-pay | Admitting: General Surgery

## 2018-11-06 DIAGNOSIS — K863 Pseudocyst of pancreas: Principal | ICD-10-CM

## 2018-11-06 LAB — COMPREHENSIVE METABOLIC PANEL
ALT: 27 U/L (ref 0–44)
AST: 20 U/L (ref 15–41)
Albumin: 1.7 g/dL — ABNORMAL LOW (ref 3.5–5.0)
Alkaline Phosphatase: 108 U/L (ref 38–126)
Anion gap: 6 (ref 5–15)
BUN: 9 mg/dL (ref 6–20)
CO2: 32 mmol/L (ref 22–32)
Calcium: 7.5 mg/dL — ABNORMAL LOW (ref 8.9–10.3)
Chloride: 101 mmol/L (ref 98–111)
Creatinine, Ser: <0.3 mg/dL — ABNORMAL LOW (ref 0.44–1.00)
Glucose, Bld: 125 mg/dL — ABNORMAL HIGH (ref 70–99)
Potassium: 3.8 mmol/L (ref 3.5–5.1)
Sodium: 139 mmol/L (ref 135–145)
Total Bilirubin: 0.3 mg/dL (ref 0.3–1.2)
Total Protein: 4.8 g/dL — ABNORMAL LOW (ref 6.5–8.1)

## 2018-11-06 LAB — CULTURE, BLOOD (ROUTINE X 2)
Culture: NO GROWTH
Culture: NO GROWTH
Special Requests: ADEQUATE

## 2018-11-06 LAB — CBC
HCT: 25.4 % — ABNORMAL LOW (ref 36.0–46.0)
Hemoglobin: 7.9 g/dL — ABNORMAL LOW (ref 12.0–15.0)
MCH: 30.6 pg (ref 26.0–34.0)
MCHC: 31.1 g/dL (ref 30.0–36.0)
MCV: 98.4 fL (ref 80.0–100.0)
Platelets: 636 10*3/uL — ABNORMAL HIGH (ref 150–400)
RBC: 2.58 MIL/uL — ABNORMAL LOW (ref 3.87–5.11)
RDW: 15.8 % — ABNORMAL HIGH (ref 11.5–15.5)
WBC: 8.7 10*3/uL (ref 4.0–10.5)
nRBC: 0.3 % — ABNORMAL HIGH (ref 0.0–0.2)

## 2018-11-06 LAB — MAGNESIUM: Magnesium: 1.9 mg/dL (ref 1.7–2.4)

## 2018-11-06 LAB — LIPASE, BLOOD: Lipase: 137 U/L — ABNORMAL HIGH (ref 11–51)

## 2018-11-06 MED ORDER — FERROUS SULFATE 220 (44 FE) MG/5ML PO ELIX
220.0000 mg | ORAL_SOLUTION | Freq: Every day | ORAL | Status: DC
  Filled 2018-11-06 (×3): qty 5

## 2018-11-06 MED ORDER — MAGNESIUM SULFATE 2 GM/50ML IV SOLN
2.0000 g | Freq: Once | INTRAVENOUS | Status: AC
  Administered 2018-11-06: 18:00:00 2 g via INTRAVENOUS
  Filled 2018-11-06: qty 50

## 2018-11-06 NOTE — TOC Progression Note (Signed)
Transition of Care [facility]) - Progression Note    Patient Details  Name: Monique Nguyen MRN: [id number] Date of Birth: 1971-03-20  Transition of Care [facility] CM/SW Contact  Annice Needy, LCSW Phone Number: 11/06/2018, 3:34 PM  Clinical Narrative:    Patient did complete the virtual visit with Dr. Dahlia Client at the [facility] [phone] on 11/01/18.  She has a follow up appointment on 11/19/2018 at 11:00a.m. Patient is interested in PCA services for additional help in the home. Patient is willing to pay privately for PCA services. She receives $823/month in widow benefits, a little over $1000 in her adult son's disability and she has her stimulus check in the home uncashed.  Patient states that all three of these checks have not been cashed because she is unable to get out of bed and go to the bank and she does not have a bank account to have a family member deposit for her. Eugenio Hoes, with [facility], patient's current [facility] provider indicated that their agency has Home Care services that include PCA services. She stated that she would contact the director of Home Care, Renetta Darden, and have her contact in regards to the referral.    Expected Discharge Plan: Home w Home Health Services Barriers to Discharge: No Barriers Identified  Expected Discharge Plan and Services Expected Discharge Plan: Home w Home Health Services In-house Referral: Nutrition, PCP / Health Connect Discharge Planning Services: CM Consult Post Acute Care Choice: Home Health Living arrangements for the past 2 months: Single Family Home                 DME Arranged: Tube feeding DME Agency: [employer] Date DME Agency Contacted: 11/02/18 Time DME Agency Contacted: [phone] Representative spoke with at DME Agency: Priscille Loveless HH Arranged: RN, PT HH Agency: [facility] Date [facility] Agency Contacted: 11/02/18 Time HH Agency Contacted: [phone] Representative spoke with at [facility] Agency: Eugenio Hoes   Social  Determinants of Health (SDOH) Interventions    Readmission Risk Interventions Readmission Risk Prevention Plan 10/23/2018 10/17/2018 10/16/2018  Transportation Screening - - Complete  PCP or Specialist Appt within 5-7 Days Complete - -  PCP or Specialist Appt within 3-5 Days - Complete -  Home Care Screening Complete - -  Medication Review (RN CM) Complete - -  HRI or Home Care Consult - - Complete  Social Work Consult for Recovery Care Planning/Counseling - - Complete  Palliative Care Screening - - Complete  Medication Review Oceanographer) - - Complete  Some recent data might be hidden

## 2018-11-06 NOTE — Progress Notes (Signed)
PROGRESS NOTE  Monique Nguyen:[id number] DOB: 1971/03/01 DOA: 11/01/2018 PCP: Patient, No Pcp Per Brief History:  48 year old female with a history of alcohol induced chronic pancreatitis, failure to thrive, pancreatic pseudocyst, anxiety/depression, and chronic abdominal pain presenting with nausea, vomiting, and worsening abdominal pain for 2 to 3 days duration. The patient was recently discharged from the hospital after a long stay from 10/14/2018 through 10/23/2018. During that hospital stay, the patient was treated for acute on chronic pancreatitis as well as failure to thrive. EGD was performed on 10/16/2018 which was unremarkable and did not show gastric outlet obstruction. She initially had an NJ tube, but when this was dislodged, gastrostomy tube was ultimately placed. She was sent home with Osmolite enteral feedings.She states that she has drank fluids by mouth, but has not put any thing of substance or solid by mouth. She has some nausea when her gastrostomy tube is flushed, but has emesis occasionally when she takes any fluids by mouth. Her last bowel movement was on 10/30/2018. She states that there was formed. She has not drank alcohol in nearly 2 months. She has not smoked in over 1 month. Patient states that she had a fever of 101.1 F at home on 11/01/2018. She denies any headache, sore throat, coughing, hemoptysis. She has some intermittent shortness of breath. She denies any new medications, but states that she has been taking 2 ibuprofen on a daily basis.[facility] nurse sent patient to ED for fever of 101.1 on 11/01/18. In the emergency department, the patient was afebrile hemodynamically stable saturating 100% on 2 L. CT of the abdomen and pelvis showed multiple pancreatic cystic lesions consistent with pseudocysts located in the GE junction, pancreatic body, and pancreatic head. The gastrostomy tube was in the appropriate position. The cecum was markedly distended  with stool. In addition, she had a markedly distended urinary bladder without hydronephrosis.WBC was 15.8 with lipase 803. LFTs were unremarkable. Urinalysis showed 11-20 WBC. GI was consulted to assist with management.  Assessment/Plan: Acute on chronic pancreatitis -Referral has been sent to [facility], Dr. [name] for EUS/ERCP - Appreciate GI c/s, Dr. [name] - creon has been d/c'd as pt unable to afford. Restart tube feeds. Miralax.  -Continue enteral feedings- pt asked these to be d/c'd overnight 5/23-5/24 -Enteral feedings restarted 5/24--tolerating at 25cc/hr -Continue PPI -Continue IV fluids--change to LR--add 20 KCl -11/01/2018 CT abdomen and pelvis--small bilateral pleural effusions. Multiple upper abdominal fluid collections, most consistent with pseudocysts. (see report)  - lipase downtrending [id number]  Aspiration pneumonitis -Patient had numerous episodes of emesis -Personally reviewed chest x-ray--right basilar opacity -Continue cefepime and metronidazole -> narrow to ceftriaxone-finished 5 days abx -Procalcitonin--0.18  Pyuria -Concerning for UTI -Culturewith diptheroids, suspect contaminant  Chronic abdominal pain -Multifactorial including constipation, urinary retention, acute on chronic pancreatitis -Continue hydromorphoneperGI -will d/c scheduled doses -Continue MiraLAX   Failure to thrive -Continue enteral feeding -continue ProStat -pt continues to turn off enteral feedings herself without notifying nursing despite numerous discussions not to do so -she will be poorly compliant with enteral feedings at home  Urinary retention - distended bladder seen on CT scan as well as high PVR of 700cc - foley placed 11/03/18 -d/c foley 5/26 for voiding trial  Resolving Hemoperitoneum: -seen on imaging 5/3 and 5/21,  -Hb remains stable.  -Continue to monitor.  -Unclear etiology. -CTA abd if Hgb drops  significantly -Continue to monitor  Stage 1 sacral skin breakdown -present at  time of admission -continue local wound care  Paroxysmal Atrial Fibrillation:seen on EKG last 5/22-23 PM. TSH is wnl. repeat EKG 5/23 with NSR. Continue tele. -5/23 Echo--EF 55-60, normal diastolic fxn, no WMA -Chadsvasc is 1. No indication for anticoagulation at this point given short duration and acute medical condition -Currently in sinus rhythm.  No further episodes  Hypokalemia Hypomagnesemia:replace, follow  Anemia of chronic Disease -iron saturation 12% -ferritin 366 -B12 329 -folate 14.2 -nulecit x 1 -start iron per Gtube       Disposition Plan:   Home 11/07/18 if stable Family Communication:   No Family at bedside  Consultants:  GI  Code Status:  FULL   DVT Prophylaxis:  [place] Heparin   Procedures: As Listed in Progress Note Above  Antibiotics: Ceftriaxone 5/22>>>5/26 Flagyl 5/22>>>5/26      Subjective: Pt c/o abd pain overall better but still moderate and generalized.  No BM but does not feel constipated.  Complains nausea without emesis.  No f/c, cp, sob, dysuria.  Pt turned off her TF again today without notifying staff.  Objective: Vitals:   11/05/18 2121 11/06/18 0451 11/06/18 0555 11/06/18 1428  BP: 100/72  106/71 109/79  Pulse: 99  100 96  Resp: Temp: 98.5 F (36.9 C)  97.7 F (36.5 C)   TempSrc: Oral  Oral   SpO2: 98%  99% 95%  Weight:  52.2 kg    Height:        Intake/Output Summary (Last 24 hours) at 11/06/2018 1729 Last data filed at 11/06/2018 1429 Gross per 24 hour  Intake 2288.08 ml  Output 1706 ml  Net 582.08 ml   Weight change: 3 kg Exam:   General:  Pt is alert, follows commands appropriately, not in acute distress  HEENT: No icterus, No thrush, No neck mass, [place]/AT  Cardiovascular: RRR, S1/S2, no rubs, no gallops  Respiratory: bibasilar rales. No wheeze.  Abdomen: Soft/+BS, generalized tender, non  distended, no guarding  Extremities: No edema, No lymphangitis, No petechiae, No rashes, no synovitis   Data Reviewed: I have personally reviewed following labs and imaging studies Basic Metabolic Panel: Recent Labs  Lab 11/01/18 1737 11/02/18 0432 11/03/18 0530 11/04/18 0619 11/05/18 0548 11/06/18 0547  NA 131* 137 138 137 139 139  K 3.8 3.6 3.3* 3.2* 3.2* 3.8  CL 96* 103 101 98 98 101  CO2 32  GLUCOSE 330* 115* 111* 113* 152* 125*  BUN 27* 24* 23* 22* 13 9  CREATININE 0.37* <0.30* <0.30* 0.32* <0.30* <0.30*  CALCIUM 9.3 8.5* 7.8* 7.7* 7.6* 7.5*  MG 1.8  --   --  1.6* 2.1 1.9   Liver Function Tests: Recent Labs  Lab 11/02/18 0432 11/03/18 0530 11/04/18 0619 11/05/18 0548 11/06/18 0547  AST ALT 86* 55* 40 32 27  ALKPHOS 212* 151* 132* 117 108  BILITOT 0.7 0.2* 0.2* 0.3 0.3  PROT 5.4* 4.8* 4.6* 4.6* 4.8*  ALBUMIN 1.9* 1.7* 1.6* 1.7* 1.7*   Recent Labs  Lab 11/01/18 1737 11/03/18 0530 11/06/18 0547  LIPASE 803* 262* 137*   No results for input(s): AMMONIA in the last 168 hours. Coagulation Profile: No results for input(s): INR, PROTIME in the last 168 hours. CBC: Recent Labs  Lab 11/01/18 1737 11/02/18 0432 11/03/18 0530 11/04/18 0619 11/04/18 1409 11/05/18 0548 11/06/18 0547  WBC 15.8* 8.6 9.6 10.0  --  7.9 8.7  NEUTROABS 13.5*  --   --   --   --   --   --  HGB 9.2* 7.5* 7.5* 7.1* 7.3* 7.8* 7.9*  HCT 28.8* 24.9* 24.9* 22.6* 24.1* 25.3* 25.4*  MCV 96.0 99.6 99.6 98.7  --  97.3 98.4  PLT 946* 612* 620* 532*  --  603* 636*   Cardiac Enzymes: No results for input(s): CKTOTAL, CKMB, CKMBINDEX, TROPONINI in the last 168 hours. BNP: Invalid input(s): POCBNP CBG: No results for input(s): GLUCAP in the last 168 hours. HbA1C: No results for input(s): HGBA1C in the last 72 hours. Urine analysis:    Component Value Date/Time   COLORURINE AMBER (A) 11/01/2018 2315   APPEARANCEUR CLOUDY (A) 11/01/2018 2315   LABSPEC  1.036 (H) 11/01/2018 2315   PHURINE 8.0 11/01/2018 2315   GLUCOSEU >=500 (A) 11/01/2018 2315   HGBUR NEGATIVE 11/01/2018 2315   BILIRUBINUR NEGATIVE 11/01/2018 2315   KETONESUR NEGATIVE 11/01/2018 2315   PROTEINUR 100 (A) 11/01/2018 2315   UROBILINOGEN 0.2 03/06/2015 1558   NITRITE NEGATIVE 11/01/2018 2315   LEUKOCYTESUR NEGATIVE 11/01/2018 2315   Sepsis Labs: @LABRCNTIP (procalcitonin:4,lacticidven:4) ) Recent Results (from the past 240 hour(s))  Blood culture (routine x 2)     Status: None   Collection Time: 11/01/18  5:37 PM  Result Value Ref Range Status   Specimen Description BLOOD LEFT HAND  Final   Special Requests   Final    BOTTLES DRAWN AEROBIC AND ANAEROBIC Blood Culture results may not be optimal due to an excessive volume of blood received in culture bottles   Culture   Final    NO GROWTH 5 DAYS Performed at [facility], [address], [place], [place] [address]    Report Status 11/06/2018 FINAL  Final  Blood culture (routine x 2)     Status: None   Collection Time: 11/01/18  6:00 PM  Result Value Ref Range Status   Specimen Description BLOOD RIGHT ANTECUBITAL  Final   Special Requests   Final    BOTTLES DRAWN AEROBIC AND ANAEROBIC Blood Culture adequate volume   Culture   Final    NO GROWTH 5 DAYS Performed at [facility], [address], [place], [place] [address]    Report Status 11/06/2018 FINAL  Final  SARS Coronavirus 2 (CEPHEID- Performed in [facility] hospital lab), Hosp Order     Status: None   Collection Time: 11/01/18  6:36 PM  Result Value Ref Range Status   SARS Coronavirus 2 NEGATIVE NEGATIVE Final    Comment: (NOTE) If result is NEGATIVE SARS-CoV-2 target nucleic acids are NOT DETECTED. The SARS-CoV-2 RNA is generally detectable in upper and lower  respiratory specimens during the acute phase of infection. The lowest  concentration of SARS-CoV-2 viral copies this assay can detect is 250  copies / mL. A negative result does not preclude  SARS-CoV-2 infection  and should not be used as the sole basis for treatment or other  patient management decisions.  A negative result may occur with  improper specimen collection / handling, submission of specimen other  than nasopharyngeal swab, presence of viral mutation(s) within the  areas targeted by this assay, and inadequate number of viral copies  (<250 copies / mL). A negative result must be combined with clinical  observations, patient history, and epidemiological information. If result is POSITIVE SARS-CoV-2 target nucleic acids are DETECTED. The SARS-CoV-2 RNA is generally detectable in upper and lower  respiratory specimens dur ing the acute phase of infection.  Positive  results are indicative of active infection with SARS-CoV-2.  Clinical  correlation with patient history and other diagnostic  information is  necessary to determine patient infection status.  Positive results do  not rule out bacterial infection or co-infection with other viruses. If result is PRESUMPTIVE POSTIVE SARS-CoV-2 nucleic acids MAY BE PRESENT.   A presumptive positive result was obtained on the submitted specimen  and confirmed on repeat testing.  While 2019 novel coronavirus  (SARS-CoV-2) nucleic acids may be present in the submitted sample  additional confirmatory testing may be necessary for epidemiological  and / or clinical management purposes  to differentiate between  SARS-CoV-2 and other Sarbecovirus currently known to infect humans.  If clinically indicated additional testing with an alternate test  methodology [phone]) is advised. The SARS-CoV-2 RNA is generally  detectable in upper and lower respiratory sp ecimens during the acute  phase of infection. The expected result is Negative. Fact Sheet for Patients:  BoilerBrush.com.cy Fact Sheet for Healthcare Providers: https://pope.com/ This test is not yet approved or cleared by the  [place] FDA and has been authorized for detection and/or diagnosis of SARS-CoV-2 by FDA under an Emergency Use Authorization (EUA).  This EUA will remain in effect (meaning this test can be used) for the duration of the COVID-19 declaration under Section 564(b)(1) of the Act, 21 U.S.C. section 360bbb-3(b)(1), unless the authorization is terminated or revoked sooner. Performed at [facility], [address], [place], [place] [address]   Urine culture     Status: Abnormal   Collection Time: 11/01/18 11:15 PM  Result Value Ref Range Status   Specimen Description   Final    URINE, CATHETERIZED Performed at [facility], [address], [place], [place] [address]    Special Requests   Final    NONE Performed at [facility], [address], [place], [place] [address]    Culture (A)  Final    60,000 COLONIES/mL DIPHTHEROIDS(CORYNEBACTERIUM SPECIES) Standardized susceptibility testing for this organism is not available. Performed at [facility], [address] [place], [place] [address]    Report Status 11/03/2018 FINAL  Final     Scheduled Meds:  calcium carbonate (dosed in mg elemental calcium)  1,000 mg of elemental calcium Per Tube TID WC   feeding supplement (PRO-STAT SUGAR FREE 64)  30 mL Per Tube Daily   folic acid  1 mg Per Tube Daily   heparin  5,000 Units Subcutaneous Q8H   imipramine  10 mg Per Tube QHS   mouth rinse  15 mL Mouth Rinse BID   nicotine  14 mg Transdermal Daily   ondansetron (ZOFRAN) IV  4 mg Intravenous TID WC & HS   pantoprazole sodium  40 mg Per Tube BID AC   sertraline  50 mg Per Tube Daily   sodium chloride flush  3 mL Intravenous Q12H   thiamine  100 mg Per Tube Daily   Continuous Infusions:  sodium chloride     feeding supplement (OSMOLITE 1.5 CAL) 1,000 mL (11/05/18 0547)   lactated ringers with kcl 75 mL/hr at 11/06/18 1105    Procedures/Studies: Ct Abdomen Pelvis W Contrast  Result Date: 11/01/2018 CLINICAL  DATA:  Generalized abdominal pain EXAM: CT ABDOMEN AND PELVIS WITH CONTRAST TECHNIQUE: Multidetector CT imaging of the abdomen and pelvis was performed using the standard protocol following bolus administration of intravenous contrast. CONTRAST:  OMNIPAQUE IOHEXOL 300 MG/ML  SOLN COMPARISON:  CT abdomen pelvis 10/14/2018 FINDINGS: LOWER CHEST: Small pleural effusions and bibasilar atelectasis. HEPATOBILIARY: The hepatic contours and density are normal. There is no intra- or extrahepatic biliary  dilatation. The gallbladder is normal. PANCREAS: There are multiple peripancreatic cystic lesions. The most superior, near the gastro esophageal junction, now measures 1.7 cm, previously 2.5 cm. Collection along the anterior pancreatic body measures 3.3 x 3.2 cm, previously 2.4 x 1.8 cm. Collection of the pancreatic head measures 2.7 x 2.0 cm, previously 4.0 x 2.9 cm. Chronic atrophy of the pancreatic tail and distal body. SPLEEN: Normal. ADRENALS/URINARY TRACT: --Adrenal glands: Normal. --Right kidney/ureter: No hydronephrosis, nephroureterolithiasis, perinephric stranding or solid renal mass. --Left kidney/ureter: No hydronephrosis, nephroureterolithiasis, perinephric stranding or solid renal mass. --Urinary bladder: Markedly dilated. STOMACH/BOWEL: --Stomach/Duodenum: Gastrostomy tube is present within the stomach. --Small bowel: No dilatation or inflammation. --Colon: The cecum is markedly stool distended. --Appendix: Normal. VASCULAR/LYMPHATIC: Normal course and caliber of the major abdominal vessels. No abdominal or pelvic lymphadenopathy. REPRODUCTIVE: Normal uterus and ovaries. Small amount of free fluid in the pelvis. MUSCULOSKELETAL. No bony spinal canal stenosis or focal osseous abnormality. OTHER: None. IMPRESSION: 1. Multiple upper abdominal fluid collections, including collections in the pancreatic head and body and at the gastroesophageal junction, with mixed changes in size; the pancreatic body  collection showing the greatest growth and the pancreatic head collection showing the greatest decrease. These collections are most consistent with pancreatic pseudocysts. No specific findings of infection, though this would be difficult to entirely exclude. 2. Distended and stool-filled cecum. 3. Markedly distended urinary bladder without hydronephrosis. 4. Decreased density of fluid in the posterior cul-de-sac, likely resolving hemoperitoneum. Electronically Signed   By: Deatra Robinson M.D.   On: 11/01/2018 22:05   Ct Abdomen Pelvis W Contrast  Result Date: 10/14/2018 CLINICAL DATA:  Inpatient. Failure to thrive. Chronic abdominal pain. Difficulty swallowing with sensation of food sticking in throat. Weight loss. History of pancreatitis. EXAM: CT ABDOMEN AND PELVIS WITH CONTRAST TECHNIQUE: Multidetector CT imaging of the abdomen and pelvis was performed using the standard protocol following bolus administration of intravenous contrast. CONTRAST:  30mL OMNIPAQUE IOHEXOL 300 MG/ML SOLN, 75mL OMNIPAQUE IOHEXOL 300 MG/ML SOLN COMPARISON:  10/14/2018 abdominal radiographs. 08/04/2017 CT abdomen/pelvis. FINDINGS: Motion degraded scan, partially limiting assessment. Lower chest: Right middle lobe 2 mm solid pulmonary nodule (series 5/image 1), not previously imaged. Additional tiny 2 mm pulmonary nodules at both lung bases are unchanged from 2017 CT, considered benign. Irregular thick walled 2.5 x 1.7 x 3.2 cm fluid collection along the anterior margin of the lower thoracic esophagus (series 2/image 11), new since 08/04/2017 CT. Similar irregular thick walled curvilinear fluid collection posterior to the esophagogastric junction extending into the right retrocrural region measuring 3.2 x 1.5 x 4.1 cm (series 2/image 17), new. Hepatobiliary: Normal liver size. No liver mass. Normal gallbladder with no radiopaque cholelithiasis. No biliary ductal dilatation. CBD diameter 5 mm. Pancreas: Chronic atrophy of distal body and  tail of the pancreas with associated pancreatic duct dilation in these pancreatic segments, compatible with chronic pancreatitis. New thick-walled 4.1 x 2.9 x 3.5 cm pancreatic head cystic lesion (series 2/image 26). Posterior pancreatic head 1.0 x 0.8 cm cystic lesion (series 2/image 28), mildly increased from 0.7 x 0.6 cm. New clustered cystic pancreatic lesions in the pancreatic body, largest 2.4 x 1.7 cm anteriorly (series 2/image 21). No significant acute peripancreatic fat stranding. Spleen: Normal size. No mass. Adrenals/Urinary Tract: Normal adrenals. Simple 1.1 cm upper left renal cyst. Otherwise normal kidneys, with no hydronephrosis. Chronic mild diffuse bladder wall thickening with moderate bladder distention. Stomach/Bowel: Mild wall thickening throughout the esophagogastric junction. Irregular thick walled fluid collection along the posterior margin  of the antropyloric stomach measuring approximately 3.7 x 2.0 x 5.1 cm (series 2/image 31), new, exerting mild extrinsic mass-effect on the distal stomach. Stomach is not significantly distended. Normal caliber small bowel with no small bowel wall thickening. Oral contrast transits to the distal small bowel. Normal appendix. Normal large bowel with no diverticulosis, large bowel wall thickening or pericolonic fat stranding. Vascular/Lymphatic: Atherosclerotic nonaneurysmal abdominal aorta. Patent hepatic, portal and renal veins. Diminutive splenic vein, which remains patent, unchanged. No pathologically enlarged lymph nodes in the abdomen or pelvis. New ill-defined curvilinear soft tissue in the central mesentery, which appears to communicate with the posterior distal gastric fluid collection. Reproductive: Grossly normal uterus. No discrete adnexal mass. Small volume hemoperitoneum in the pelvic cul-de-sac. Other: No pneumoperitoneum.  No abdominal ascites. Musculoskeletal: No aggressive appearing focal osseous lesions. IMPRESSION: 1. Multiple new  thick-walled cystic lesions throughout the pancreas, largest 4.1 cm in the pancreatic head, most likely new pancreatic pseudocysts. Redemonstrated findings of chronic pancreatitis in the distal body and tail of the pancreas. 2. New irregular thick walled fluid collections encasing the esophagogastric junction anteriorly and posteriorly with associated wall thickening throughout the esophagogastric junction. Similar new irregular thick walled fluid collection along the posterior margin of the antropyloric stomach with some mass effect on the distal stomach, which is not significantly distended. These nonspecific findings also presumably represent pancreatic pseudocysts with reactive wall thickening at the esophagogastric junction and distal stomach. Infected collections cannot be excluded. 3. Close CT or MRI abdomen follow-up imaging advised. 4. No biliary ductal dilatation.  No radiopaque cholelithiasis. 5. Nonspecific small volume hemoperitoneum in the pelvic cul-de-sac. No discrete adnexal mass. Suggest follow-up pelvic ultrasound on a short-term basis when clinically feasible. 6. Nonspecific chronic diffuse bladder wall thickening. Moderate bladder distention. No hydronephrosis. 7. Right middle lobe 2 mm solid pulmonary nodule, not previously imaged. No follow-up needed if patient is low-risk. Non-contrast chest CT can be considered in 12 months if patient is high-risk. This recommendation follows the consensus statement: Guidelines for Management of Incidental Pulmonary Nodules Detected on CT Images:From the Fleischner Society 2017; published online before print (10.1148/radiol.[id number]). 8.  Aortic Atherosclerosis (ICD10-I70.0). Electronically Signed   By: Delbert Phenix M.D.   On: 10/14/2018 12:49   Dg Chest Portable 1 View  Result Date: 11/01/2018 CLINICAL DATA:  Fever EXAM: PORTABLE CHEST 1 VIEW COMPARISON:  Chest x-ray dated 10/09/2014 FINDINGS: Cardiac silhouette is enlarged. There is blunting of the  right costophrenic angle with a right basilar airspace opacity. There is no pneumothorax. No large pleural effusion. There may be generalized volume overload. There is nonspecific gaseous distention of the partially visualized stomach. IMPRESSION: 1. Blunting of the right costophrenic angle favored to be secondary to a combination of a trace right-sided effusion and atelectasis. An infiltrate or aspiration is not excluded. 2. Mild cardiomegaly. Electronically Signed   By: Katherine Mantle M.D.   On: 11/01/2018 18:23   Dg Abdomen Acute W/chest  Result Date: 10/14/2018 CLINICAL DATA:  Vomiting EXAM: DG ABDOMEN ACUTE W/ 1V CHEST COMPARISON:  08/04/2017 CT abdomen/pelvis. 10/09/2014 chest radiograph. FINDINGS: Stable cardiomediastinal silhouette with normal heart size. No pneumothorax. No pleural effusion. Lungs appear clear, with no acute consolidative airspace disease and no pulmonary edema. No dilated small bowel loops or air-fluid levels. Moderate gas and mild stool throughout the colon. No evidence of pneumatosis or pneumoperitoneum. No radiopaque nephrolithiasis. IMPRESSION: 1. No active cardiopulmonary disease. 2. Nonobstructive bowel gas pattern. Moderate gas and mild stool in the colon. Electronically Signed  By: Delbert PhenixJason A Poff M.D.   On: 10/14/2018 09:25   Dg Vangie BickerNaso G Tube Plc W/fl W/rad  Result Date: 10/17/2018 CLINICAL DATA:  Feeding tube placement, chronic pancreatitis, weight loss, failure to thrive EXAM: NASO G TUBE PLACEMENT WITH FL AND WITH RAD CONTRAST:  15 mL ISOVUE-300 IOPAMIDOL (ISOVUE-300) INJECTION 61% FLUOROSCOPY TIME:  Fluoroscopy Time:  3 minutes 12 seconds Radiation Exposure Index (if provided by the fluoroscopic device): 33.9 mGy Number of Acquired Spot Images: Single cine fluoroscopic series COMPARISON:  CT abdomen and pelvis 10/14/2018 FINDINGS: Pharynx was anesthetized with Cetacaine spray. Utilizing lubricant, a feeding tube was passed through the RIGHT nasal passages in the  hypopharynx. With patient swallowing and neck flexed, feeding tube was advanced into the thoracic esophagus and down into the stomach. Catheter was manipulated and placed across the pylorus into the duodenum. Catheter was advanced to the level of ligament of Treitz. Contrast injection confirms presence of the catheter tip at the ligament of Treitz. IMPRESSION: Placement of nasogastric feeding tube to the ligament of Treitz. Electronically Signed   By: Ulyses SouthwardMark  Boles M.D.   On: 10/17/2018 18:07   Dg Esophagus W Single Cm (sol Or Thin Ba)  Result Date: 10/15/2018 CLINICAL DATA:  Dysphagia, lower esophageal and epigastric pain EXAM: ESOPHOGRAM / BARIUM SWALLOW / BARIUM TABLET STUDY TECHNIQUE: Combined double contrast and single contrast examination performed using effervescent crystals, thick barium liquid, and thin barium liquid. The patient was observed with fluoroscopy swallowing a 13 mm barium sulphate tablet. FLUOROSCOPY TIME:  Fluoroscopy Time:  1 minutes 30 seconds Radiation Exposure Index (if provided by the fluoroscopic device): 17.9 mGy Number of Acquired Spot Images: multiple fluoroscopic screen captures COMPARISON:  None FINDINGS: Esophageal distention: Normal without mass or stricture Filling defects:  None 12.5 mm barium tablet: Easily passed from oral cavity to stomach without obstruction. Motility:  Normal Mucosa:  Smooth without irregularity or ulceration Hypopharynx/cervical esophagus: Normal motion without laryngeal penetration or aspiration Hiatal hernia:  Absent GE reflux:  Not identified during exam Other:  N/A IMPRESSION: Unremarkable esophagram. Electronically Signed   By: Ulyses SouthwardMark  Boles M.D.   On: 10/15/2018 15:06    Catarina Hartshornavid Cynithia Hakimi, DO  [facility] Pager [phone]  If 7PM-7AM, please contact night-coverage [email or web address] Password [email or web address] 11/06/2018, 5:29 PM   LOS: 5 days

## 2018-11-06 NOTE — Progress Notes (Signed)
    Subjective: Abdominal pain more pronounced with discontinuing scheduled doses. Overall feels better since admission. No N/V. Doesn't feel constipated. States had a BM last 2 days, soft. Tube feed 51ml/hour.   Objective: Vital signs in last 24 hours: Temp:  [97.7 F (36.5 C)-98.5 F (36.9 C)] 97.7 F (36.5 C) (05/26 0555) Pulse Rate:  [99-109] 100 (05/26 0555) Resp:  [16-18] 16 (05/26 0555) BP: (100-107)/(71-74) 106/71 (05/26 0555) SpO2:  [91 %-99 %] 99 % (05/26 0555) Weight:  [52.2 kg] 52.2 kg (05/26 0451) Last BM Date: 10/31/18 General:   Alert and oriented, pleasant Head:  Normocephalic and atraumatic. Abdomen:  Round but soft, TTP periumbilically. +BS. PEG tube intact. Abdominal binder in place.  Extremities:  Without  edema. Neurologic:  Alert and  oriented x4 Psych:   Normal mood and affect.  Intake/Output from previous day: 05/25 0701 - 05/26 0700 In: 2228.1 [I.V.:568.1; NG/GT:1360; IV Piggyback:300] Out: 357 [Urine:350; Stool:7] Intake/Output this shift: No intake/output data recorded.  Lab Results: Recent Labs    11/04/18 0619 11/04/18 1409 11/05/18 0548 11/06/18 0547  WBC 10.0  --  7.9 8.7  HGB 7.1* 7.3* 7.8* 7.9*  HCT 22.6* 24.1* 25.3* 25.4*  PLT 532*  --  603* 636*   BMET Recent Labs    11/04/18 0619 11/05/18 0548 11/06/18 0547  NA 137 139 139  K 3.2* 3.2* 3.8  CL 98 98 101  CO2 28 31 32  GLUCOSE 113* 152* 125*  BUN 22* 13 9  CREATININE 0.32* <0.30* <0.30*  CALCIUM 7.7* 7.6* 7.5*   LFT Recent Labs    11/04/18 0619 11/05/18 0548 11/06/18 0547  PROT 4.6* 4.6* 4.8*  ALBUMIN 1.6* 1.7* 1.7*  AST 19 19 20   ALT 40 32 27  ALKPHOS 132* 117 108  BILITOT 0.2* 0.3 0.3    Assessment: 48 year old female admitted with acute on chronic pancreatitis, constipation, with history of known pseudocysts, failure to thrive, s/p PEG tube during last admission. CT this admission with resolving hemoperitoneum of unknown etiology.   Normocytic anemia:  previously Hgb 13 early May 2020, now in 7-8 range without overt GI bleeding. Multifactorial. Remains stable.   Tolerating tube feeds at 25 ml/hour, clinically improved from admission. Constipation resolved. Creon discontinued several days ago. Patient stating unable to afford as outpatient. Referral placed earlier this month to Dr. Edyth Gunnels with [facility] for further evaluation. Unknown status of this appointment.   Plan: Continue supportive measures Miralax prn constipation PPI BID Will check on status of referral to [facility]  Gelene Mink, PhD, ANP-BC [facility]      LOS: 5 days    11/06/2018, 8:05 AM

## 2018-11-07 ENCOUNTER — Other Ambulatory Visit: Payer: Self-pay | Admitting: Family Medicine

## 2018-11-07 DIAGNOSIS — F419 Anxiety disorder, unspecified: Secondary | ICD-10-CM

## 2018-11-07 LAB — COMPREHENSIVE METABOLIC PANEL
ALT: 28 U/L (ref 0–44)
AST: 30 U/L (ref 15–41)
Albumin: 2 g/dL — ABNORMAL LOW (ref 3.5–5.0)
Alkaline Phosphatase: 110 U/L (ref 38–126)
Anion gap: 10 (ref 5–15)
BUN: 7 mg/dL (ref 6–20)
CO2: 27 mmol/L (ref 22–32)
Calcium: 7.8 mg/dL — ABNORMAL LOW (ref 8.9–10.3)
Chloride: 102 mmol/L (ref 98–111)
Creatinine, Ser: <0.3 mg/dL — ABNORMAL LOW (ref 0.44–1.00)
Glucose, Bld: 167 mg/dL — ABNORMAL HIGH (ref 70–99)
Potassium: 4 mmol/L (ref 3.5–5.1)
Sodium: 139 mmol/L (ref 135–145)
Total Bilirubin: 0.3 mg/dL (ref 0.3–1.2)
Total Protein: 5.1 g/dL — ABNORMAL LOW (ref 6.5–8.1)

## 2018-11-07 LAB — CBC
HCT: 26.8 % — ABNORMAL LOW (ref 36.0–46.0)
Hemoglobin: 8.2 g/dL — ABNORMAL LOW (ref 12.0–15.0)
MCH: 30 pg (ref 26.0–34.0)
MCHC: 30.6 g/dL (ref 30.0–36.0)
MCV: 98.2 fL (ref 80.0–100.0)
Platelets: 636 10*3/uL — ABNORMAL HIGH (ref 150–400)
RBC: 2.73 MIL/uL — ABNORMAL LOW (ref 3.87–5.11)
RDW: 16.3 % — ABNORMAL HIGH (ref 11.5–15.5)
WBC: 8.6 10*3/uL (ref 4.0–10.5)
nRBC: 0.4 % — ABNORMAL HIGH (ref 0.0–0.2)

## 2018-11-07 LAB — MAGNESIUM: Magnesium: 2 mg/dL (ref 1.7–2.4)

## 2018-11-07 MED ORDER — HYDROXYZINE HCL 25 MG PO TABS: 25.0000 mg | ORAL_TABLET | Freq: Three times a day (TID) | ORAL | 0 refills | Status: DC | PRN

## 2018-11-07 MED ORDER — FERROUS SULFATE 300 (60 FE) MG/5ML PO SYRP
300.0000 mg | ORAL_SOLUTION | Freq: Every day | ORAL | Status: DC
  Administered 2018-11-07: 11:00:00 300 mg via ORAL
  Filled 2018-11-07 (×3): qty 5

## 2018-11-07 MED ORDER — IMIPRAMINE HCL 10 MG PO TABS: 10.0000 mg | ORAL_TABLET | Freq: Every day | ORAL | 3 refills | Status: DC

## 2018-11-07 MED ORDER — OXYCODONE HCL 5 MG/5ML PO SOLN: 5.0000 mg | ORAL | 0 refills | Status: DC | PRN

## 2018-11-07 MED ORDER — SERTRALINE HCL 50 MG PO TABS: 50.0000 mg | ORAL_TABLET | Freq: Every day | ORAL | Status: DC

## 2018-11-07 MED ORDER — POLYETHYLENE GLYCOL 3350 17 G PO PACK: 17.0000 g | PACK | Freq: Every day | ORAL | 0 refills | Status: AC | PRN

## 2018-11-07 MED ORDER — PANTOPRAZOLE SODIUM 40 MG PO PACK: 40.0000 mg | PACK | Freq: Two times a day (BID) | ORAL | 0 refills | Status: DC

## 2018-11-07 MED ORDER — THIAMINE HCL 100 MG PO TABS: 100.0000 mg | ORAL_TABLET | Freq: Every day | ORAL | 0 refills | Status: AC

## 2018-11-07 MED ORDER — FERROUS SULFATE 300 (60 FE) MG/5ML PO SYRP: 300.0000 mg | ORAL_SOLUTION | Freq: Every day | ORAL | 0 refills | Status: DC

## 2018-11-07 MED ORDER — FOLIC ACID 1 MG PO TABS: 1.0000 mg | ORAL_TABLET | Freq: Every day | ORAL | 0 refills | Status: AC

## 2018-11-07 NOTE — Discharge Instructions (Signed)
IMPORTANT INFORMATION: PAY CLOSE ATTENTION   PHYSICIAN DISCHARGE INSTRUCTIONS  Follow with Primary care provider  Patient, No Pcp Per  and other consultants as instructed your Hospitalist Physician  SEEK MEDICAL CARE OR RETURN TO EMERGENCY ROOM IF SYMPTOMS COME BACK, WORSEN OR NEW PROBLEM DEVELOPS.   Please note: You were cared for by a hospitalist during your hospital stay. Every effort will be made to forward records to your primary care provider.  You can request that your primary care provider send for your hospital records if they have not received them.  Once you are discharged, your primary care physician will handle any further medical issues. Please note that NO REFILLS for any discharge medications will be authorized once you are discharged, as it is imperative that you return to your primary care physician (or establish a relationship with a primary care physician if you do not have one) for your post hospital discharge needs so that they can reassess your need for medications and monitor your lab values.  Please get a complete blood count and chemistry panel checked by your Primary MD at your next visit, and again as instructed by your Primary MD.  Get Medicines reviewed and adjusted: Please take all your medications with you for your next visit with your Primary MD  Laboratory/radiological data: Please request your Primary MD to go over all hospital tests and procedure/radiological results at the follow up, please ask your primary care provider to get all Hospital records sent to his/her office.  In some cases, they will be blood work, cultures and biopsy results pending at the time of your discharge. Please request that your primary care provider follow up on these results.  If you are diabetic, please bring your blood sugar readings with you to your follow up appointment with primary care.    Please call and make your follow up appointments as soon as possible.    Also Note  the following: If you experience worsening of your admission symptoms, develop shortness of breath, life threatening emergency, suicidal or homicidal thoughts you must seek medical attention immediately by calling 911 or calling your MD immediately  if symptoms less severe.  You must read complete instructions/literature along with all the possible adverse reactions/side effects for all the Medicines you take and that have been prescribed to you. Take any new Medicines after you have completely understood and accpet all the possible adverse reactions/side effects.   Do not drive when taking Pain medications or sleeping medications (Benzodiazepines)  Do not take more than prescribed Pain, Sleep and Anxiety Medications. It is not advisable to combine anxiety,sleep and pain medications without talking with your primary care practitioner  Special Instructions: If you have smoked or chewed Tobacco  in the last 2 yrs please stop smoking, stop any regular Alcohol  and or any Recreational drug use.  Wear Seat belts while driving.

## 2018-11-07 NOTE — Telephone Encounter (Signed)
Called [facility], they were awaiting most recent CT scan results prior to scheduling virtual visit. Feb CT results had been sent already. 2 CT reports from 10/2018 faxed to [facility].

## 2018-11-07 NOTE — Progress Notes (Signed)
IV and telemetry discontinued from patient; PEG tube clamped, site cleaned, and split dressing changed prior to discharge.  Manya Silvas, RN

## 2018-11-07 NOTE — Progress Notes (Signed)
Update: Pt's BF calling asking Rx for oxycodone be sent to [facility] due to [facility] not carrying the medication.

## 2018-11-07 NOTE — TOC Transition Note (Signed)
Transition of Care [facility]) - CM/SW Discharge Note   Patient Details  Name: Monique Nguyen MRN: [id number] Date of Birth: June 30, 1970  [facility]) CM/SW Contact:  Annice Needy, LCSW Phone Number: 11/07/2018, 12:46 PM   Clinical Narrative:    Patient returning home with resumption of HH through [facility]. [facility] will discuss PCA options with patient. Eugenio Hoes with [facility] notified of patient's discharge today.     Barriers to Discharge: No Barriers Identified   Patient Goals and CMS Choice Patient states their goals for this hospitalization and ongoing recovery are:: increased care CMS Medicare.gov Compare Post Acute Care list provided to:: Patient Choice offered to / list presented to : NA  Discharge Placement                       Discharge Plan and Services In-house Referral: Nutrition, PCP / [facility] Connect Discharge Planning Services: CM Consult Post Acute Care Choice: Home Health          DME Arranged: Tube feeding DME Agency: [facility] Date DME Agency Contacted: 11/02/18 Time DME Agency Contacted: 1240 Representative spoke with at DME Agency: Priscille Loveless HH Arranged: PT, RN, Nurse's Aide HH Agency: [facility] Date [facility] Agency Contacted: 11/07/18 Time HH Agency Contacted: 1245 Representative spoke with at [facility] Agency: Eugenio Hoes  Social Determinants of Health (SDOH) Interventions     Readmission Risk Interventions Readmission Risk Prevention Plan 10/23/2018 10/17/2018 10/16/2018  Transportation Screening - - Complete  PCP or Specialist Appt within 5-7 Days Complete - -  PCP or Specialist Appt within 3-5 Days - Complete -  Home Care Screening Complete - -  Medication Review (RN CM) Complete - -  HRI or Home Care Consult - - Complete  Social Work Consult for Recovery Care Planning/Counseling - - Complete  Palliative Care Screening - - Complete  Medication Review Oceanographer) - - Complete  Some recent data might be hidden

## 2018-11-07 NOTE — Discharge Summary (Signed)
Physician Discharge Summary  Monique Nguyen ZOX:[id number] DOB: 12-16-1970 DOA: 11/01/2018  PCP: Dr. Dahlia Client at the [facility] [phone]  She has a follow up appointment on 11/19/2018 at 11:00a.m.  Admit date: 11/01/2018 Discharge date: 11/07/2018  Admitted From:  Home  Disposition: Home   Recommendations for Outpatient Follow-up:  1. Follow up with [facility] Dr. Edyth Gunnels as scheduled 2. Follow up with Dr. Henreitta Leber in 1 week.  3. Please obtain BMP/CBC in 1-2 weeks  Home Health: RN,  PT  Discharge Condition: STABLE   CODE STATUS: FULL    Brief Hospitalization Summary: Please see all hospital notes, images, labs for full details of the hospitalization. Dr. Fredirick Lathe HPI:  Monique Nguyen  is a 48 y.o. female  with past medical history relevant for GERD , chronic alcoholic pancreatitis with pseudocyst and failure to thrive, s/p  recent PEG tube placement on 10/22/2018 , also has past medical history relevant for depression/anxiety, as well as psoriasis and tobacco abuse  Now Presents with nausea and abdominal pain, tolerating PEG tube feeding well but unable to take additional intake orally due to her nausea and emesis  Fevers at home , tachycardia here in the hospital with borderline low BP , WBC is up to 15.8 , lipase elevated at 803 (up from 76 couple weeks ago), lactic acid is not elevated  In ED.... CT abd/Pelvis with findings consistent with pancreatic pseudocysts, chest x-ray with possible aspiration pneumonia  UA suggestive of UTI  EDP obtained cultures, gave IV cefepime and vancomycin and tried hospitalist called for admission  Pt was recently dced from this hospital on 10/23/18 ---Pt has not followed up with Dr. [name][facility] for advanced endoscopy and care of her pancreatic pseudocysts Brief History: 48 year old female with a history of alcohol induced chronic pancreatitis, failure to thrive, pancreatic pseudocyst, anxiety/depression,  and chronic abdominal pain presenting with nausea, vomiting, and worsening abdominal pain for 2 to 3 days duration. The patient was recently discharged from the hospital after a long stay from 10/14/2018 through 10/23/2018. During that hospital stay, the patient was treated for acute on chronic pancreatitis as well as failure to thrive. EGD was performed on 10/16/2018 which was unremarkable and did not show gastric outlet obstruction. She initially had an NJ tube, but when this was dislodged, gastrostomy tube was ultimately placed. She was sent home with Osmolite enteral feedings.She states that she has drank fluids by mouth, but has not put any thing of substance or solid by mouth. She has some nausea when her gastrostomy tube is flushed, but has emesis occasionally when she takes any fluids by mouth. Her last bowel movement was on 10/30/2018. She states that there was formed. She has not drank alcohol in nearly 2 months. She has not smoked in over 1 month. Patient states that she had a fever of 101.1 F at home on 11/01/2018. She denies any headache, sore throat, coughing, hemoptysis. She has some intermittent shortness of breath. She denies any new medications, but states that she has been taking 2 ibuprofen on a daily basis.[facility] nurse sent patient to ED for fever of 101.1 on 11/01/18. In the emergency department, the patient was afebrile hemodynamically stable saturating 100% on 2 L. CT of the abdomen and pelvis showed multiple pancreatic cystic lesions consistent with pseudocysts located in the GE junction, pancreatic body, and pancreatic head. The gastrostomy tube was in the appropriate position. The cecum was markedly distended with stool. In addition, she had a  markedly distended urinary bladder without hydronephrosis.WBC was 15.8 with lipase 803. LFTs were unremarkable. Urinalysis showed 11-20 WBC. GI was consulted to assist with management.  Assessment/Plan: Acute on chronic  pancreatitis -Referral has been sent to [facility], Dr. [name] for EUS/ERCP - Appreciate GI c/s, Dr. [name] - creon has been d/c'd as pt unable to afford. Restarted tube feeds. Miralax.  -Continue enteral feedings-  -Enteral feedings restarted 5/24--tolerating at 25cc/hr -Continue PPI -11/01/2018 CT abdomen and pelvis--small bilateral pleural effusions. Multiple upper abdominal fluid collections, most consistent with pseudocysts. (see report)  - lipase downtrending803>>>[id number]>>>137 - GI arranged referral to Dr. Edyth Gunnels for tertiary subspecialty follow up  Aspiration pneumonitis - TREATED -Patient had numerous episodes of emesis -Personally reviewed chest x-ray--right basilar opacity -Treated with cefepime and metronidazole -> narrowed to ceftriaxone-finished 5 days abx -Procalcitonin--0.18  Pyuria -Concerning for UTI -Culturewith diptheroids, suspect contaminant  Chronic abdominal pain -Multifactorial including constipation, urinary retention, acute on chronic pancreatitis -Continue hydromorphoneperGI on as needed basis -will d/c scheduled doses -Continue MiraLAX PRN  Failure to thrive -Continue enteral feeding -continue ProStat -pt continues to turn off enteral feedings herself without notifying nursing despite numerous discussions not to do so -she will likely continue to be poorly compliant with enteral feedings at home  Urinary retention - distended bladder seen on CT scan as well as high PVR of 700cc -foley placed 11/03/18 -d/c foley 5/26 for voiding trial  Resolving Hemoperitoneum: -seen on imaging5/3 and 5/21 -Hb remains stable. -Continue to monitor outpatient. -Unclear etiology. -CTA abd if Hgb drops significantly -Continue to monitor  Stage 1 sacral skin breakdown -present at time of admission -continue local wound care  Paroxysmal Atrial Fibrillation:seen on EKG last 5/22-23 PM. TSH is wnl. repeat EKG 5/23 with NSR.  Continue tele. -5/23 Echo--EF 55-60, normal diastolic fxn, no WMA -Chadsvasc is 1. No indication for anticoagulation at this pointgiven short duration and acute medical condition -Currently in sinus rhythm.  No further episodes  HypokalemiaHypomagnesemia:repleted  AnemiaofchronicDisease -iron saturation 12% -ferritin 366 -B12 329 -folate 14.2 -nulecit x 1 -started iron per Gtube   Disposition Plan: Home 11/07/18  Consultants:GI  Code Status: FULL   DVT Prophylaxis: [place] Heparin   Procedures: As Listed in Progress Note Above  Antibiotics: Ceftriaxone 5/22>>>5/26 Flagyl 5/22>>>5/26  Discharge Diagnoses:  Principal Problem:   Pancreatic pseudocyst Active Problems:   Depression   Anxiety   Pancreatic cyst   Alcohol-induced chronic pancreatitis (HCC)   Acute on chronic pancreatitis (HCC)   Adult failure to thrive   GERD (gastroesophageal reflux disease)   Chronic pancreatitis (HCC)   Protein-calorie malnutrition, severe   Pressure injury of skin   Generalized abdominal pain   Elevated lipase   Sepsis (HCC)   Discharge Instructions: Discharge Instructions    Call MD for:  difficulty breathing, headache or visual disturbances   Complete by:  As directed    Call MD for:  extreme fatigue   Complete by:  As directed    Call MD for:  persistant nausea and vomiting   Complete by:  As directed    Call MD for:  severe uncontrolled pain   Complete by:  As directed    Increase activity slowly   Complete by:  As directed      Allergies as of 11/07/2018      Reactions   Doxycycline Other (See Comments)   headaches   Sulfa Antibiotics Nausea And Vomiting, Other (See Comments)   Blurred vision and headaches  Medication List    STOP taking these medications   lipase/protease/amylase 36000 UNITS Cpep capsule Commonly known as:  CREON   Pancrelipase (Lip-Prot-Amyl) 24000-76000 units Cpep   pantoprazole 40 MG tablet Commonly known  as:  PROTONIX Replaced by:  pantoprazole sodium 40 mg/20 mL Pack     TAKE these medications   calcium carbonate (dosed in mg elemental calcium) 1250 MG/5ML Susp Take 10 mLs (1,000 mg of elemental calcium total) by mouth 3 (three) times daily with meals for 30 days.   feeding supplement (OSMOLITE 1.5 CAL) Liqd Place 1,000 mLs into feeding tube continuous.   feeding supplement (PRO-STAT SUGAR FREE 64) Liqd Take 30 mLs by mouth 2 (two) times daily.   ferrous sulfate 300 (60 Fe) MG/5ML syrup Place 5 mLs (300 mg total) into feeding tube daily with breakfast for 30 days. Start taking on:  Nov 08, 2018   folic acid 1 MG tablet Commonly known as:  FOLVITE Place 1 tablet (1 mg total) into feeding tube daily for 30 days. Start taking on:  Nov 08, 2018   hydrOXYzine 25 MG tablet Commonly known as:  ATARAX/VISTARIL Place 1 tablet (25 mg total) into feeding tube 3 (three) times daily as needed for anxiety.   imipramine 10 MG tablet Commonly known as:  TOFRANIL Place 1 tablet (10 mg total) into feeding tube at bedtime. What changed:  how to take this   oxyCODONE 5 MG/5ML solution Commonly known as:  ROXICODONE Place 5 mLs (5 mg total) into feeding tube every 4 (four) hours as needed for up to 7 days for severe pain.   pantoprazole sodium 40 mg/20 mL Pack Commonly known as:  PROTONIX Place 20 mLs (40 mg total) into feeding tube 2 (two) times daily before a meal for 30 days. Replaces:  pantoprazole 40 MG tablet   polyethylene glycol 17 g packet Commonly known as:  MIRALAX / GLYCOLAX Place 17 g into feeding tube daily as needed for up to 30 days for mild constipation.   sertraline 50 MG tablet Commonly known as:  ZOLOFT Place 1 tablet (50 mg total) into feeding tube daily. What changed:  how to take this   thiamine 100 MG tablet Place 1 tablet (100 mg total) into feeding tube daily for 30 days. Start taking on:  Nov 08, 2018      Follow-up Information    [facility]. Schedule an appointment as soon as possible for a visit in 1 week(s).   Specialty:  General Surgery Contact information: [address] [place] [phone]       [facility].   Why:  Monday at 11AM for your hospital follow up appointment Contact information: [address], [place], [place] [address]  [phone]       Ned GraceBaron, Todd H, MD. Schedule an appointment as soon as possible for a visit in 1 week(s).   Specialty:  Psychiatry Contact information: [id number] [facility] [place] [address] [phone]          Allergies  Allergen Reactions  . Doxycycline Other (See Comments)    headaches  . Sulfa Antibiotics Nausea And Vomiting and Other (See Comments)    Blurred vision and headaches   Allergies as of 11/07/2018      Reactions   Doxycycline Other (See Comments)   headaches   Sulfa Antibiotics Nausea And Vomiting, Other (See Comments)   Blurred vision  and headaches      Medication List    STOP taking these medications   lipase/protease/amylase 16109 UNITS Cpep capsule Commonly known as:  CREON   Pancrelipase (Lip-Prot-Amyl) 24000-76000 units Cpep   pantoprazole 40 MG tablet Commonly known as:  PROTONIX Replaced by:  pantoprazole sodium 40 mg/20 mL Pack     TAKE these medications   calcium carbonate (dosed in mg elemental calcium) 1250 MG/5ML Susp Take 10 mLs (1,000 mg of elemental calcium total) by mouth 3 (three) times daily with meals for 30 days.   feeding supplement (OSMOLITE 1.5 CAL) Liqd Place 1,000 mLs into feeding tube continuous.   feeding supplement (PRO-STAT SUGAR FREE 64) Liqd Take 30 mLs by mouth 2 (two) times daily.   ferrous sulfate 300 (60 Fe) MG/5ML syrup Place 5 mLs (300 mg total) into feeding tube daily with breakfast for 30 days. Start taking on:  Nov 08, 2018   folic acid 1 MG tablet Commonly known as:   FOLVITE Place 1 tablet (1 mg total) into feeding tube daily for 30 days. Start taking on:  Nov 08, 2018   hydrOXYzine 25 MG tablet Commonly known as:  ATARAX/VISTARIL Place 1 tablet (25 mg total) into feeding tube 3 (three) times daily as needed for anxiety.   imipramine 10 MG tablet Commonly known as:  TOFRANIL Place 1 tablet (10 mg total) into feeding tube at bedtime. What changed:  how to take this   oxyCODONE 5 MG/5ML solution Commonly known as:  ROXICODONE Place 5 mLs (5 mg total) into feeding tube every 4 (four) hours as needed for up to 7 days for severe pain.   pantoprazole sodium 40 mg/20 mL Pack Commonly known as:  PROTONIX Place 20 mLs (40 mg total) into feeding tube 2 (two) times daily before a meal for 30 days. Replaces:  pantoprazole 40 MG tablet   polyethylene glycol 17 g packet Commonly known as:  MIRALAX / GLYCOLAX Place 17 g into feeding tube daily as needed for up to 30 days for mild constipation.   sertraline 50 MG tablet Commonly known as:  ZOLOFT Place 1 tablet (50 mg total) into feeding tube daily. What changed:  how to take this   thiamine 100 MG tablet Place 1 tablet (100 mg total) into feeding tube daily for 30 days. Start taking on:  Nov 08, 2018       Procedures/Studies: Ct Abdomen Pelvis W Contrast  Result Date: 11/01/2018 CLINICAL DATA:  Generalized abdominal pain EXAM: CT ABDOMEN AND PELVIS WITH CONTRAST TECHNIQUE: Multidetector CT imaging of the abdomen and pelvis was performed using the standard protocol following bolus administration of intravenous contrast. CONTRAST:  OMNIPAQUE IOHEXOL 300 MG/ML  SOLN COMPARISON:  CT abdomen pelvis 10/14/2018 FINDINGS: LOWER CHEST: Small pleural effusions and bibasilar atelectasis. HEPATOBILIARY: The hepatic contours and density are normal. There is no intra- or extrahepatic biliary dilatation. The gallbladder is normal. PANCREAS: There are multiple peripancreatic cystic lesions. The most superior,  near the gastro esophageal junction, now measures 1.7 cm, previously 2.5 cm. Collection along the anterior pancreatic body measures 3.3 x 3.2 cm, previously 2.4 x 1.8 cm. Collection of the pancreatic head measures 2.7 x 2.0 cm, previously 4.0 x 2.9 cm. Chronic atrophy of the pancreatic tail and distal body. SPLEEN: Normal. ADRENALS/URINARY TRACT: --Adrenal glands: Normal. --Right kidney/ureter: No hydronephrosis, nephroureterolithiasis, perinephric stranding or solid renal mass. --Left kidney/ureter: No hydronephrosis, nephroureterolithiasis, perinephric stranding or solid renal mass. --Urinary bladder: Markedly dilated. STOMACH/BOWEL: --Stomach/Duodenum: Gastrostomy tube is  present within the stomach. --Small bowel: No dilatation or inflammation. --Colon: The cecum is markedly stool distended. --Appendix: Normal. VASCULAR/LYMPHATIC: Normal course and caliber of the major abdominal vessels. No abdominal or pelvic lymphadenopathy. REPRODUCTIVE: Normal uterus and ovaries. Small amount of free fluid in the pelvis. MUSCULOSKELETAL. No bony spinal canal stenosis or focal osseous abnormality. OTHER: None. IMPRESSION: 1. Multiple upper abdominal fluid collections, including collections in the pancreatic head and body and at the gastroesophageal junction, with mixed changes in size; the pancreatic body collection showing the greatest growth and the pancreatic head collection showing the greatest decrease. These collections are most consistent with pancreatic pseudocysts. No specific findings of infection, though this would be difficult to entirely exclude. 2. Distended and stool-filled cecum. 3. Markedly distended urinary bladder without hydronephrosis. 4. Decreased density of fluid in the posterior cul-de-sac, likely resolving hemoperitoneum. Electronically Signed   By: Deatra Robinson M.D.   On: 11/01/2018 22:05   Ct Abdomen Pelvis W Contrast  Result Date: 10/14/2018 CLINICAL DATA:  Inpatient. Failure to thrive. Chronic  abdominal pain. Difficulty swallowing with sensation of food sticking in throat. Weight loss. History of pancreatitis. EXAM: CT ABDOMEN AND PELVIS WITH CONTRAST TECHNIQUE: Multidetector CT imaging of the abdomen and pelvis was performed using the standard protocol following bolus administration of intravenous contrast. CONTRAST:  30mL OMNIPAQUE IOHEXOL 300 MG/ML SOLN, 75mL OMNIPAQUE IOHEXOL 300 MG/ML SOLN COMPARISON:  10/14/2018 abdominal radiographs. 08/04/2017 CT abdomen/pelvis. FINDINGS: Motion degraded scan, partially limiting assessment. Lower chest: Right middle lobe 2 mm solid pulmonary nodule (series 5/image 1), not previously imaged. Additional tiny 2 mm pulmonary nodules at both lung bases are unchanged from 2017 CT, considered benign. Irregular thick walled 2.5 x 1.7 x 3.2 cm fluid collection along the anterior margin of the lower thoracic esophagus (series 2/image 11), new since 08/04/2017 CT. Similar irregular thick walled curvilinear fluid collection posterior to the esophagogastric junction extending into the right retrocrural region measuring 3.2 x 1.5 x 4.1 cm (series 2/image 17), new. Hepatobiliary: Normal liver size. No liver mass. Normal gallbladder with no radiopaque cholelithiasis. No biliary ductal dilatation. CBD diameter 5 mm. Pancreas: Chronic atrophy of distal body and tail of the pancreas with associated pancreatic duct dilation in these pancreatic segments, compatible with chronic pancreatitis. New thick-walled 4.1 x 2.9 x 3.5 cm pancreatic head cystic lesion (series 2/image 26). Posterior pancreatic head 1.0 x 0.8 cm cystic lesion (series 2/image 28), mildly increased from 0.7 x 0.6 cm. New clustered cystic pancreatic lesions in the pancreatic body, largest 2.4 x 1.7 cm anteriorly (series 2/image 21). No significant acute peripancreatic fat stranding. Spleen: Normal size. No mass. Adrenals/Urinary Tract: Normal adrenals. Simple 1.1 cm upper left renal cyst. Otherwise normal kidneys,  with no hydronephrosis. Chronic mild diffuse bladder wall thickening with moderate bladder distention. Stomach/Bowel: Mild wall thickening throughout the esophagogastric junction. Irregular thick walled fluid collection along the posterior margin of the antropyloric stomach measuring approximately 3.7 x 2.0 x 5.1 cm (series 2/image 31), new, exerting mild extrinsic mass-effect on the distal stomach. Stomach is not significantly distended. Normal caliber small bowel with no small bowel wall thickening. Oral contrast transits to the distal small bowel. Normal appendix. Normal large bowel with no diverticulosis, large bowel wall thickening or pericolonic fat stranding. Vascular/Lymphatic: Atherosclerotic nonaneurysmal abdominal aorta. Patent hepatic, portal and renal veins. Diminutive splenic vein, which remains patent, unchanged. No pathologically enlarged lymph nodes in the abdomen or pelvis. New ill-defined curvilinear soft tissue in the central mesentery, which appears to communicate with  the posterior distal gastric fluid collection. Reproductive: Grossly normal uterus. No discrete adnexal mass. Small volume hemoperitoneum in the pelvic cul-de-sac. Other: No pneumoperitoneum.  No abdominal ascites. Musculoskeletal: No aggressive appearing focal osseous lesions. IMPRESSION: 1. 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 Redemonstrated findings of chronic pancreatitis in the distal body and tail of the pancreas. 2. New irregular thick walled fluid collections encasing the esophagogastric junction anteriorly and posteriorly with associated wall thickening throughout the esophagogastric junction. Similar new irregular thick walled fluid collection along the posterior margin of the antropyloric stomach with some mass effect on the distal stomach, which is not significantly distended. These nonspecific findings also  presumably represent pancreatic pseudocysts with reactive wall thickening at the esophagogastric junction and distal stomach. Infected collections cannot be excluded. 3. Close CT or MRI abdomen follow-up imaging advised. 4. No biliary ductal dilatation.  No radiopaque cholelithiasis. 5. Nonspecific small volume hemoperitoneum in the pelvic cul-de-sac. No discrete adnexal mass. Suggest follow-up pelvic ultrasound on a short-term basis when clinically feasible. 6. Nonspecific chronic diffuse bladder wall thickening. Moderate bladder distention. No hydronephrosis. 7. Right middle lobe 2 mm solid pulmonary nodule, not previously imaged. No follow-up needed if patient is low-risk. Non-contrast chest CT can be considered in 12 months if patient is high-risk. This recommendation follows the consensus statement: Guidelines for Management of Incidental Pulmonary Nodules Detected on CT Images:From the Fleischner Society 2017; published online before print (10.1148/radiol.9604540981). 8.  Aortic Atherosclerosis (ICD10-I70.0). Electronically Signed   By: Delbert Phenix M.D.   On: 10/14/2018 12:49   Dg Chest Portable 1 View  Result Date: 11/01/2018 CLINICAL DATA:  Fever EXAM: PORTABLE CHEST 1 VIEW COMPARISON:  Chest x-ray dated 10/09/2014 FINDINGS: Cardiac silhouette is enlarged. There is blunting of the right costophrenic angle with a right basilar airspace opacity. There is no pneumothorax. No large pleural effusion. There may be generalized volume overload. There is nonspecific gaseous distention of the partially visualized stomach. IMPRESSION: 1. Blunting of the right costophrenic angle favored to be secondary to a combination of a trace right-sided effusion and atelectasis. An infiltrate or aspiration is not excluded. 2. Mild cardiomegaly. Electronically Signed   By: Katherine Mantle M.D.   On: 11/01/2018 18:23   Dg Abdomen Acute W/chest  Result Date: 10/14/2018 CLINICAL DATA:  Vomiting EXAM: DG ABDOMEN ACUTE W/ 1V  CHEST COMPARISON:  08/04/2017 CT abdomen/pelvis. 10/09/2014 chest radiograph. FINDINGS: Stable cardiomediastinal silhouette with normal heart size. No pneumothorax. No pleural effusion. Lungs appear clear, with no acute consolidative airspace disease and no pulmonary edema. No dilated small bowel loops or air-fluid levels. Moderate gas and mild stool throughout the colon. No evidence of pneumatosis or pneumoperitoneum. No radiopaque nephrolithiasis. IMPRESSION: 1. No active cardiopulmonary disease. 2. Nonobstructive bowel gas pattern. Moderate gas and mild stool in the colon. Electronically Signed   By: Delbert Phenix M.D.   On: 10/14/2018 09:25   Dg Vangie Bicker G Tube Plc W/fl W/rad  Result Date: 10/17/2018 CLINICAL DATA:  Feeding tube placement, chronic pancreatitis, weight loss, failure to thrive EXAM: NASO G TUBE PLACEMENT WITH FL AND WITH RAD CONTRAST:  15 mL ISOVUE-300 IOPAMIDOL (ISOVUE-300) INJECTION 61% FLUOROSCOPY TIME:  Fluoroscopy Time:  3 minutes 12 seconds Radiation Exposure Index (if provided by the fluoroscopic device): 33.9 mGy Number of Acquired Spot Images: Single cine fluoroscopic series COMPARISON:  CT abdomen and pelvis 10/14/2018 FINDINGS: Pharynx was anesthetized with Cetacaine spray. Utilizing lubricant, a feeding tube  was passed through the RIGHT nasal passages in the hypopharynx. With patient swallowing and neck flexed, feeding tube was advanced into the thoracic esophagus and down into the stomach. Catheter was manipulated and placed across the pylorus into the duodenum. Catheter was advanced to the level of ligament of Treitz. Contrast injection confirms presence of the catheter tip at the ligament of Treitz. IMPRESSION: Placement of nasogastric feeding tube to the ligament of Treitz. Electronically Signed   By: Ulyses Southward M.D.   On: 10/17/2018 18:07   Dg Esophagus W Single Cm (sol Or Thin Ba)  Result Date: 10/15/2018 CLINICAL DATA:  Dysphagia, lower esophageal and epigastric pain EXAM:  ESOPHOGRAM / BARIUM SWALLOW / BARIUM TABLET STUDY TECHNIQUE: Combined double contrast and single contrast examination performed using effervescent crystals, thick barium liquid, and thin barium liquid. The patient was observed with fluoroscopy swallowing a 13 mm barium sulphate tablet. FLUOROSCOPY TIME:  Fluoroscopy Time:  1 minutes 30 seconds Radiation Exposure Index (if provided by the fluoroscopic device): 17.9 mGy Number of Acquired Spot Images: multiple fluoroscopic screen captures COMPARISON:  None FINDINGS: Esophageal distention: Normal without mass or stricture Filling defects:  None 12.5 mm barium tablet: Easily passed from oral cavity to stomach without obstruction. Motility:  Normal Mucosa:  Smooth without irregularity or ulceration Hypopharynx/cervical esophagus: Normal motion without laryngeal penetration or aspiration Hiatal hernia:  Absent GE reflux:  Not identified during exam Other:  N/A IMPRESSION: Unremarkable esophagram. Electronically Signed   By: Ulyses Southward M.D.   On: 10/15/2018 15:06      Subjective: Pt having no further nausea or emesis, pain controlled per patient.  Anxiety better controlled.  Pt wants to go home.    Discharge Exam: Vitals:   11/06/18 2158 11/07/18 0503  BP: 109/76 128/88  Pulse: 95 (!) 101  Resp: 16 20  Temp: 98 F (36.7 C) 98.2 F (36.8 C)  SpO2: 94% 100%   Vitals:   11/06/18 0555 11/06/18 1428 11/06/18 2158 11/07/18 0503  BP: 106/71 109/79 109/76 128/88  Pulse: 100 96 95 (!) 101  Resp: 16 16 16 20   Temp: 97.7 F (36.5 C)  98 F (36.7 C) 98.2 F (36.8 C)  TempSrc: Oral  Oral Oral  SpO2: 99% 95% 94% 100%  Weight:      Height:        General: Pt is alert, awake, not in acute distress Cardiovascular: RRR, S1/S2 +, no rubs, no gallops Respiratory: CTA bilaterally, no wheezing, no rhonchi Abdominal: Soft, NT, ND, bowel sounds + Extremities: no edema, no cyanosis   The results of significant diagnostics from this hospitalization (including  imaging, microbiology, ancillary and laboratory) are listed below for reference.     Microbiology: Recent Results (from the past 240 hour(s))  Blood culture (routine x 2)     Status: None   Collection Time: 11/01/18  5:37 PM  Result Value Ref Range Status   Specimen Description BLOOD LEFT HAND  Final   Special Requests   Final    BOTTLES DRAWN AEROBIC AND ANAEROBIC Blood Culture results may not be optimal due to an excessive volume of blood received in culture bottles   Culture   Final    NO GROWTH 5 DAYS Performed at [facility], [address] [place], [place] [address]    Report Status 11/06/2018 FINAL  Final  Blood culture (routine x 2)     Status: None   Collection Time: 11/01/18  6:00 PM  Result Value Ref Range Status  Specimen Description BLOOD RIGHT ANTECUBITAL  Final   Special Requests   Final    BOTTLES DRAWN AEROBIC AND ANAEROBIC Blood Culture adequate volume   Culture   Final    NO GROWTH 5 DAYS Performed at [facility], [address] [place], [place] [address]    Report Status 11/06/2018 FINAL  Final  SARS Coronavirus 2 (CEPHEID- Performed in [facility] hospital lab), Hosp Order     Status: None   Collection Time: 11/01/18  6:36 PM  Result Value Ref Range Status   SARS Coronavirus 2 NEGATIVE NEGATIVE Final    Comment: (NOTE) If result is NEGATIVE SARS-CoV-2 target nucleic acids are NOT DETECTED. The SARS-CoV-2 RNA is generally detectable in upper and lower  respiratory specimens during the acute phase of infection. The lowest  concentration of SARS-CoV-2 viral copies this assay can detect is 250  copies / mL. A negative result does not preclude SARS-CoV-2 infection  and should not be used as the sole basis for treatment or other  patient management decisions.  A negative result may occur with  improper specimen collection / handling, submission of specimen other  than nasopharyngeal swab, presence of viral mutation(s) within the  areas targeted by this  assay, and inadequate number of viral copies  (<250 copies / mL). A negative result must be combined with clinical  observations, patient history, and epidemiological information. If result is POSITIVE SARS-CoV-2 target nucleic acids are DETECTED. The SARS-CoV-2 RNA is generally detectable in upper and lower  respiratory specimens dur ing the acute phase of infection.  Positive  results are indicative of active infection with SARS-CoV-2.  Clinical  correlation with patient history and other diagnostic information is  necessary to determine patient infection status.  Positive results do  not rule out bacterial infection or co-infection with other viruses. If result is PRESUMPTIVE POSTIVE SARS-CoV-2 nucleic acids MAY BE PRESENT.   A presumptive positive result was obtained on the submitted specimen  and confirmed on repeat testing.  While 2019 novel coronavirus  (SARS-CoV-2) nucleic acids may be present in the submitted sample  additional confirmatory testing may be necessary for epidemiological  and / or clinical management purposes  to differentiate between  SARS-CoV-2 and other Sarbecovirus currently known to infect humans.  If clinically indicated additional testing with an alternate test  methodology [phone]) is advised. The SARS-CoV-2 RNA is generally  detectable in upper and lower respiratory sp ecimens during the acute  phase of infection. The expected result is Negative. Fact Sheet for Patients:  BoilerBrush.com.cy Fact Sheet for Healthcare Providers: https://pope.com/ This test is not yet approved or cleared by the [place] FDA and has been authorized for detection and/or diagnosis of SARS-CoV-2 by FDA under an Emergency Use Authorization (EUA).  This EUA will remain in effect (meaning this test can be used) for the duration of the COVID-19 declaration under Section 564(b)(1) of the Act, 21 U.S.C. section 360bbb-3(b)(1),  unless the authorization is terminated or revoked sooner. Performed at [facility], [address] [place], [place] [address]   Urine culture     Status: Abnormal   Collection Time: 11/01/18 11:15 PM  Result Value Ref Range Status   Specimen Description   Final    URINE, CATHETERIZED Performed at [facility], [address], [place], [place] [address]    Special Requests   Final    NONE Performed at [facility], [address] [place], [place] [address]    Culture (A)  Final  60,000 COLONIES/mL DIPHTHEROIDS(CORYNEBACTERIUM SPECIES) Standardized susceptibility testing for this organism is not available. Performed at [facility], [address] [place], [place] [address]    Report Status 11/03/2018 FINAL  Final     Labs: BNP (last 3 results) No results for input(s): BNP in the last 8760 hours. Basic Metabolic Panel: Recent Labs  Lab 11/01/18 1737  11/03/18 0530 11/04/18 0619 11/05/18 0548 11/06/18 0547 11/07/18 0611  NA 131*   < > 138 137 139 139 139  K 3.8   < > 3.3* 3.2* 3.2* 3.8 4.0  CL 96*   < > 101 98 98 101 102  CO2 24   < > 27 28 31  32 27  GLUCOSE 330*   < > 111* 113* 152* 125* 167*  BUN 27*   < > 23* 22* 13 9 7   CREATININE 0.37*   < > <0.30* 0.32* <0.30* <0.30* <0.30*  CALCIUM 9.3   < > 7.8* 7.7* 7.6* 7.5* 7.8*  MG 1.8  --   --  1.6* 2.1 1.9 2.0   < > = values in this interval not displayed.   Liver Function Tests: Recent Labs  Lab 11/03/18 0530 11/04/18 0619 11/05/18 0548 11/06/18 0547 11/07/18 0611  AST 20 19 19 20 30   ALT 55* 40 32 27 28  ALKPHOS 151* 132* 117 108 110  BILITOT 0.2* 0.2* 0.3 0.3 0.3  PROT 4.8* 4.6* 4.6* 4.8* 5.1*  ALBUMIN 1.7* 1.6* 1.7* 1.7* 2.0*   Recent Labs  Lab 11/01/18 1737 11/03/18 0530 11/06/18 0547  LIPASE 803* 262* 137*   No results for input(s): AMMONIA in the last 168 hours. CBC: Recent Labs  Lab 11/01/18 1737  11/03/18 0530 11/04/18 0619 11/04/18 1409 11/05/18 0548 11/06/18 0547  11/07/18 0611  WBC 15.8*   < > 9.6 10.0  --  7.9 8.7 8.6  NEUTROABS 13.5*  --   --   --   --   --   --   --   HGB 9.2*   < > 7.5* 7.1* 7.3* 7.8* 7.9* 8.2*  HCT 28.8*   < > 24.9* 22.6* 24.1* 25.3* 25.4* 26.8*  MCV 96.0   < > 99.6 98.7  --  97.3 98.4 98.2  PLT 946*   < > 620* 532*  --  603* 636* 636*   < > = values in this interval not displayed.   Cardiac Enzymes: No results for input(s): CKTOTAL, CKMB, CKMBINDEX, TROPONINI in the last 168 hours. BNP: Invalid input(s): POCBNP CBG: No results for input(s): GLUCAP in the last 168 hours. D-Dimer No results for input(s): DDIMER in the last 72 hours. Hgb A1c No results for input(s): HGBA1C in the last 72 hours. Lipid Profile No results for input(s): CHOL, HDL, LDLCALC, TRIG, CHOLHDL, LDLDIRECT in the last 72 hours. Thyroid function studies No results for input(s): TSH, T4TOTAL, T3FREE, THYROIDAB in the last 72 hours.  Invalid input(s): FREET3 Anemia work up No results for input(s): VITAMINB12, FOLATE, FERRITIN, TIBC, IRON, RETICCTPCT in the last 72 hours. Urinalysis    Component Value Date/Time   COLORURINE AMBER (A) 11/01/2018 2315   APPEARANCEUR CLOUDY (A) 11/01/2018 2315   LABSPEC 1.036 (H) 11/01/2018 2315   PHURINE 8.0 11/01/2018 2315   GLUCOSEU >=500 (A) 11/01/2018 2315   HGBUR NEGATIVE 11/01/2018 2315   BILIRUBINUR NEGATIVE 11/01/2018 2315   KETONESUR NEGATIVE 11/01/2018 2315   PROTEINUR 100 (A) 11/01/2018 2315   UROBILINOGEN 0.2 03/06/2015 1558   NITRITE NEGATIVE 11/01/2018 2315  LEUKOCYTESUR NEGATIVE 11/01/2018 2315   Sepsis Labs Invalid input(s): PROCALCITONIN,  WBC,  LACTICIDVEN Microbiology Recent Results (from the past 240 hour(s))  Blood culture (routine x 2)     Status: None   Collection Time: 11/01/18  5:37 PM  Result Value Ref Range Status   Specimen Description BLOOD LEFT HAND  Final   Special Requests   Final    BOTTLES DRAWN AEROBIC AND ANAEROBIC Blood Culture results may not be optimal due to an  excessive volume of blood received in culture bottles   Culture   Final    NO GROWTH 5 DAYS Performed at [facility], [address] [place], [place] [address]    Report Status 11/06/2018 FINAL  Final  Blood culture (routine x 2)     Status: None   Collection Time: 11/01/18  6:00 PM  Result Value Ref Range Status   Specimen Description BLOOD RIGHT ANTECUBITAL  Final   Special Requests   Final    BOTTLES DRAWN AEROBIC AND ANAEROBIC Blood Culture adequate volume   Culture   Final    NO GROWTH 5 DAYS Performed at [facility], [address] [place], [place] [address]    Report Status 11/06/2018 FINAL  Final  SARS Coronavirus 2 (CEPHEID- Performed in [facility] hospital lab), Hosp Order     Status: None   Collection Time: 11/01/18  6:36 PM  Result Value Ref Range Status   SARS Coronavirus 2 NEGATIVE NEGATIVE Final    Comment: (NOTE) If result is NEGATIVE SARS-CoV-2 target nucleic acids are NOT DETECTED. The SARS-CoV-2 RNA is generally detectable in upper and lower  respiratory specimens during the acute phase of infection. The lowest  concentration of SARS-CoV-2 viral copies this assay can detect is 250  copies / mL. A negative result does not preclude SARS-CoV-2 infection  and should not be used as the sole basis for treatment or other  patient management decisions.  A negative result may occur with  improper specimen collection / handling, submission of specimen other  than nasopharyngeal swab, presence of viral mutation(s) within the  areas targeted by this assay, and inadequate number of viral copies  (<250 copies / mL). A negative result must be combined with clinical  observations, patient history, and epidemiological information. If result is POSITIVE SARS-CoV-2 target nucleic acids are DETECTED. The SARS-CoV-2 RNA is generally detectable in upper and lower  respiratory specimens dur ing the acute phase of infection.  Positive  results are indicative of active infection  with SARS-CoV-2.  Clinical  correlation with patient history and other diagnostic information is  necessary to determine patient infection status.  Positive results do  not rule out bacterial infection or co-infection with other viruses. If result is PRESUMPTIVE POSTIVE SARS-CoV-2 nucleic acids MAY BE PRESENT.   A presumptive positive result was obtained on the submitted specimen  and confirmed on repeat testing.  While 2019 novel coronavirus  (SARS-CoV-2) nucleic acids may be present in the submitted sample  additional confirmatory testing may be necessary for epidemiological  and / or clinical management purposes  to differentiate between  SARS-CoV-2 and other Sarbecovirus currently known to infect humans.  If clinically indicated additional testing with an alternate test  methodology [phone]) is advised. The SARS-CoV-2 RNA is generally  detectable in upper and lower respiratory sp ecimens during the acute  phase of infection. The expected result is Negative. Fact Sheet for Patients:  BoilerBrush.com.cy Fact Sheet for Healthcare Providers: https://pope.com/ This test is not yet  approved or cleared by the [place] and has been authorized for detection and/or diagnosis of SARS-CoV-2 by FDA under an Emergency Use Authorization (EUA).  This EUA will remain in effect (meaning this test can be used) for the duration of the COVID-19 declaration under Section 564(b)(1) of the Act, 21 U.S.C. section 360bbb-3(b)(1), unless the authorization is terminated or revoked sooner. Performed at [facility], [address], [place], [place] [address]   Urine culture     Status: Abnormal   Collection Time: 11/01/18 11:15 PM  Result Value Ref Range Status   Specimen Description   Final    URINE, CATHETERIZED Performed at [facility], [address], [place], [place] [address]    Special Requests   Final    NONE Performed at [facility], [address], [place], [place] [address]    Culture (A)  Final    60,000 COLONIES/mL DIPHTHEROIDS(CORYNEBACTERIUM SPECIES) Standardized susceptibility testing for this organism is not available. Performed at [facility], [address], [place], [place] [address]    Report Status 11/03/2018 FINAL  Final    Time coordinating discharge: 34 minutes   SIGNED:  Standley Dakins, MD  [facility] 11/07/2018, 12:07 PM How to contact the [facility] Attending or Consulting provider 7A - 7P or covering provider during after hours 7P -7A, for this patient?  1. Check the care team in [facility] and look for a) attending/consulting [facility] provider listed and b) the [facility] team listed 2. Log into [email or web address] and use [place]'s universal password to access. If you do not have the password, please contact the hospital operator. 3. Locate the [facility] provider you are looking for under [facility] and page to a number that you can be directly reached. 4. If you still have difficulty reaching the provider, please page the [facility] (Director on Call) for the Hospitalists listed on amion for assistance.

## 2018-11-07 NOTE — Telephone Encounter (Signed)
Any word on referral to [facility]?

## 2018-11-11 ENCOUNTER — Inpatient Hospital Stay (HOSPITAL_COMMUNITY): Payer: Medicaid Other

## 2018-11-11 ENCOUNTER — Encounter (HOSPITAL_COMMUNITY): Payer: Self-pay | Admitting: Emergency Medicine

## 2018-11-11 ENCOUNTER — Emergency Department (HOSPITAL_COMMUNITY): Payer: Medicaid Other

## 2018-11-11 ENCOUNTER — Other Ambulatory Visit: Payer: Self-pay

## 2018-11-11 ENCOUNTER — Inpatient Hospital Stay (HOSPITAL_COMMUNITY)
Admission: EM | Admit: 2018-11-11 | Discharge: 2018-11-15 | DRG: 438 | Disposition: A | Discharge status: Home / Self Care | Payer: Medicaid Other | Attending: Internal Medicine | Admitting: Internal Medicine

## 2018-11-11 DIAGNOSIS — K859 Acute pancreatitis without necrosis or infection, unspecified: Principal | ICD-10-CM | POA: Diagnosis present

## 2018-11-11 DIAGNOSIS — K529 Noninfective gastroenteritis and colitis, unspecified: Secondary | ICD-10-CM

## 2018-11-11 DIAGNOSIS — E876 Hypokalemia: Secondary | ICD-10-CM | POA: Diagnosis not present

## 2018-11-11 DIAGNOSIS — Z1159 Encounter for screening for other viral diseases: Secondary | ICD-10-CM

## 2018-11-11 DIAGNOSIS — I480 Paroxysmal atrial fibrillation: Secondary | ICD-10-CM | POA: Diagnosis present

## 2018-11-11 DIAGNOSIS — Z6821 Body mass index (BMI) 21.0-21.9, adult: Secondary | ICD-10-CM

## 2018-11-11 DIAGNOSIS — Z881 Allergy status to other antibiotic agents status: Secondary | ICD-10-CM

## 2018-11-11 DIAGNOSIS — K219 Gastro-esophageal reflux disease without esophagitis: Secondary | ICD-10-CM | POA: Diagnosis present

## 2018-11-11 DIAGNOSIS — R627 Adult failure to thrive: Secondary | ICD-10-CM | POA: Diagnosis present

## 2018-11-11 DIAGNOSIS — K852 Alcohol induced acute pancreatitis without necrosis or infection: Secondary | ICD-10-CM

## 2018-11-11 DIAGNOSIS — IMO0002 Reserved for concepts with insufficient information to code with codable children: Secondary | ICD-10-CM

## 2018-11-11 DIAGNOSIS — L409 Psoriasis, unspecified: Secondary | ICD-10-CM | POA: Diagnosis present

## 2018-11-11 DIAGNOSIS — Z818 Family history of other mental and behavioral disorders: Secondary | ICD-10-CM

## 2018-11-11 DIAGNOSIS — Z79899 Other long term (current) drug therapy: Secondary | ICD-10-CM | POA: Diagnosis not present

## 2018-11-11 DIAGNOSIS — K5900 Constipation, unspecified: Secondary | ICD-10-CM | POA: Diagnosis present

## 2018-11-11 DIAGNOSIS — Z87891 Personal history of nicotine dependence: Secondary | ICD-10-CM

## 2018-11-11 DIAGNOSIS — R339 Retention of urine, unspecified: Secondary | ICD-10-CM | POA: Diagnosis present

## 2018-11-11 DIAGNOSIS — Z931 Gastrostomy status: Secondary | ICD-10-CM | POA: Diagnosis not present

## 2018-11-11 DIAGNOSIS — E43 Unspecified severe protein-calorie malnutrition: Secondary | ICD-10-CM | POA: Diagnosis present

## 2018-11-11 DIAGNOSIS — F1010 Alcohol abuse, uncomplicated: Secondary | ICD-10-CM | POA: Diagnosis present

## 2018-11-11 DIAGNOSIS — F329 Major depressive disorder, single episode, unspecified: Secondary | ICD-10-CM | POA: Diagnosis present

## 2018-11-11 DIAGNOSIS — K860 Alcohol-induced chronic pancreatitis: Secondary | ICD-10-CM | POA: Diagnosis present

## 2018-11-11 DIAGNOSIS — Z882 Allergy status to sulfonamides status: Secondary | ICD-10-CM

## 2018-11-11 DIAGNOSIS — K661 Hemoperitoneum: Secondary | ICD-10-CM | POA: Diagnosis present

## 2018-11-11 DIAGNOSIS — F410 Panic disorder [episodic paroxysmal anxiety] without agoraphobia: Secondary | ICD-10-CM | POA: Diagnosis present

## 2018-11-11 DIAGNOSIS — K863 Pseudocyst of pancreas: Secondary | ICD-10-CM

## 2018-11-11 DIAGNOSIS — D638 Anemia in other chronic diseases classified elsewhere: Secondary | ICD-10-CM | POA: Diagnosis present

## 2018-11-11 DIAGNOSIS — R112 Nausea with vomiting, unspecified: Secondary | ICD-10-CM | POA: Diagnosis present

## 2018-11-11 DIAGNOSIS — J9811 Atelectasis: Secondary | ICD-10-CM | POA: Diagnosis present

## 2018-11-11 DIAGNOSIS — K2970 Gastritis, unspecified, without bleeding: Secondary | ICD-10-CM

## 2018-11-11 DIAGNOSIS — D649 Anemia, unspecified: Secondary | ICD-10-CM | POA: Diagnosis present

## 2018-11-11 DIAGNOSIS — K2900 Acute gastritis without bleeding: Secondary | ICD-10-CM

## 2018-11-11 DIAGNOSIS — D72829 Elevated white blood cell count, unspecified: Secondary | ICD-10-CM

## 2018-11-11 DIAGNOSIS — Z800 Family history of malignant neoplasm of digestive organs: Secondary | ICD-10-CM

## 2018-11-11 LAB — COMPREHENSIVE METABOLIC PANEL
ALT: 36 U/L (ref 0–44)
AST: 34 U/L (ref 15–41)
Albumin: 2.9 g/dL — ABNORMAL LOW (ref 3.5–5.0)
Alkaline Phosphatase: 114 U/L (ref 38–126)
Anion gap: 14 (ref 5–15)
BUN: 22 mg/dL — ABNORMAL HIGH (ref 6–20)
CO2: 25 mmol/L (ref 22–32)
Calcium: 8.8 mg/dL — ABNORMAL LOW (ref 8.9–10.3)
Chloride: 98 mmol/L (ref 98–111)
Creatinine, Ser: 0.33 mg/dL — ABNORMAL LOW (ref 0.44–1.00)
GFR calc Af Amer: >60 mL/min (ref >60)
GFR calc non Af Amer: >60 mL/min (ref >60)
Glucose, Bld: 150 mg/dL — ABNORMAL HIGH (ref 70–99)
Potassium: 3.9 mmol/L (ref 3.5–5.1)
Sodium: 137 mmol/L (ref 135–145)
Total Bilirubin: 0.5 mg/dL (ref 0.3–1.2)
Total Protein: 7.1 g/dL (ref 6.5–8.1)

## 2018-11-11 LAB — URINALYSIS, ROUTINE W REFLEX MICROSCOPIC
Bilirubin Urine: NEGATIVE
Glucose, UA: NEGATIVE mg/dL
Hgb urine dipstick: NEGATIVE
Ketones, ur: NEGATIVE mg/dL
Leukocytes,Ua: NEGATIVE
Nitrite: NEGATIVE
Protein, ur: NEGATIVE mg/dL
Specific Gravity, Urine: 1.014 (ref 1.005–1.030)
pH: 8 (ref 5.0–8.0)

## 2018-11-11 LAB — CBC WITH DIFFERENTIAL/PLATELET
Abs Immature Granulocytes: 0.2 10*3/uL — ABNORMAL HIGH (ref 0.00–0.07)
Basophils Absolute: 0 10*3/uL (ref 0.0–0.1)
Basophils Relative: 0 %
Eosinophils Absolute: 0.4 10*3/uL (ref 0.0–0.5)
Eosinophils Relative: 3 %
HCT: 30.2 % — ABNORMAL LOW (ref 36.0–46.0)
Hemoglobin: 9.3 g/dL — ABNORMAL LOW (ref 12.0–15.0)
Immature Granulocytes: 2 %
Lymphocytes Relative: 6 %
Lymphs Abs: 0.7 10*3/uL (ref 0.7–4.0)
MCH: 30 pg (ref 26.0–34.0)
MCHC: 30.8 g/dL (ref 30.0–36.0)
MCV: 97.4 fL (ref 80.0–100.0)
Monocytes Absolute: 0.8 10*3/uL (ref 0.1–1.0)
Monocytes Relative: 6 %
Neutro Abs: 10.5 10*3/uL — ABNORMAL HIGH (ref 1.7–7.7)
Neutrophils Relative %: 83 %
Platelets: 549 10*3/uL — ABNORMAL HIGH (ref 150–400)
RBC: 3.1 MIL/uL — ABNORMAL LOW (ref 3.87–5.11)
RDW: 17.5 % — ABNORMAL HIGH (ref 11.5–15.5)
WBC: 12.6 10*3/uL — ABNORMAL HIGH (ref 4.0–10.5)
nRBC: 0 % (ref 0.0–0.2)

## 2018-11-11 LAB — LIPASE, BLOOD: Lipase: 340 U/L — ABNORMAL HIGH (ref 11–51)

## 2018-11-11 LAB — SARS CORONAVIRUS 2 BY RT PCR (HOSPITAL ORDER, PERFORMED IN ~~LOC~~ HOSPITAL LAB): SARS Coronavirus 2: NEGATIVE

## 2018-11-11 LAB — TROPONIN I: Troponin I: <0.03 ng/mL (ref <0.03)

## 2018-11-11 MED ORDER — IOHEXOL 300 MG/ML  SOLN
75.0000 mL | Freq: Once | INTRAMUSCULAR | Status: AC | PRN
  Administered 2018-11-11: 75 mL via INTRAVENOUS

## 2018-11-11 MED ORDER — SODIUM CHLORIDE 0.9 % IV SOLN
INTRAVENOUS | Status: AC
  Administered 2018-11-11 – 2018-11-12 (×2): 150 mL/h via INTRAVENOUS
  Administered 2018-11-12: 09:00:00 via INTRAVENOUS

## 2018-11-11 MED ORDER — LORAZEPAM 2 MG/ML IJ SOLN
0.5000 mg | Freq: Once | INTRAMUSCULAR | Status: AC
  Administered 2018-11-11: 0.5 mg via INTRAVENOUS
  Filled 2018-11-11: qty 1

## 2018-11-11 MED ORDER — PANTOPRAZOLE SODIUM 40 MG IV SOLR
40.0000 mg | Freq: Two times a day (BID) | INTRAVENOUS | Status: DC
  Administered 2018-11-12 – 2018-11-13 (×3): 40 mg via INTRAVENOUS
  Filled 2018-11-11 (×3): qty 40

## 2018-11-11 MED ORDER — LORAZEPAM 2 MG/ML IJ SOLN
0.5000 mg | Freq: Four times a day (QID) | INTRAMUSCULAR | Status: DC | PRN
  Administered 2018-11-11 – 2018-11-15 (×11): 0.5 mg via INTRAVENOUS
  Filled 2018-11-11 (×11): qty 1

## 2018-11-11 MED ORDER — FAMOTIDINE IN NACL 20-0.9 MG/50ML-% IV SOLN
20.0000 mg | Freq: Once | INTRAVENOUS | Status: AC
  Administered 2018-11-11: 16:00:00 20 mg via INTRAVENOUS
  Filled 2018-11-11: qty 50

## 2018-11-11 MED ORDER — ACETAMINOPHEN 650 MG RE SUPP: 650.0000 mg | Freq: Four times a day (QID) | RECTAL | Status: DC | PRN

## 2018-11-11 MED ORDER — ONDANSETRON HCL 4 MG/2ML IJ SOLN
4.0000 mg | Freq: Four times a day (QID) | INTRAMUSCULAR | Status: DC | PRN
  Administered 2018-11-12 – 2018-11-15 (×9): 4 mg via INTRAVENOUS
  Filled 2018-11-11 (×9): qty 2

## 2018-11-11 MED ORDER — PROMETHAZINE HCL 12.5 MG PO TABS
12.5000 mg | ORAL_TABLET | Freq: Once | ORAL | Status: AC
  Administered 2018-11-11: 12.5 mg via ORAL
  Filled 2018-11-11: qty 1

## 2018-11-11 MED ORDER — ENOXAPARIN SODIUM 40 MG/0.4ML ~~LOC~~ SOLN
40.0000 mg | SUBCUTANEOUS | Status: DC
  Administered 2018-11-11 – 2018-11-12 (×2): 40 mg via SUBCUTANEOUS
  Filled 2018-11-11 (×2): qty 0.4

## 2018-11-11 MED ORDER — MORPHINE SULFATE (PF) 2 MG/ML IV SOLN
1.0000 mg | INTRAVENOUS | Status: DC | PRN
  Administered 2018-11-11 – 2018-11-13 (×8): 1 mg via INTRAVENOUS
  Filled 2018-11-11 (×8): qty 1

## 2018-11-11 MED ORDER — IOHEXOL 300 MG/ML  SOLN
15.0000 mL | Freq: Once | INTRAMUSCULAR | Status: AC | PRN
  Administered 2018-11-11: 15 mL via ORAL

## 2018-11-11 MED ORDER — OXYCODONE HCL 5 MG/5ML PO SOLN
5.0000 mg | Freq: Once | ORAL | Status: AC
  Administered 2018-11-11: 5 mg via ORAL
  Filled 2018-11-11: qty 5

## 2018-11-11 MED ORDER — NICOTINE 21 MG/24HR TD PT24
21.0000 mg | MEDICATED_PATCH | Freq: Every day | TRANSDERMAL | Status: DC | PRN
  Administered 2018-11-12 – 2018-11-13 (×2): 21 mg via TRANSDERMAL
  Filled 2018-11-11 (×2): qty 1

## 2018-11-11 MED ORDER — OXYCODONE HCL 5 MG/5ML PO SOLN
5.0000 mg | Freq: Once | ORAL | Status: AC
  Administered 2018-11-11: 5 mg

## 2018-11-11 MED ORDER — PANTOPRAZOLE SODIUM 40 MG IV SOLR
40.0000 mg | Freq: Once | INTRAVENOUS | Status: AC
  Administered 2018-11-11: 40 mg via INTRAVENOUS
  Filled 2018-11-11: qty 40

## 2018-11-11 MED ORDER — PROMETHAZINE HCL 6.25 MG/5ML PO SYRP: 12.5000 mg | ORAL_SOLUTION | Freq: Once | ORAL | Status: DC

## 2018-11-11 MED ORDER — DIPHENHYDRAMINE HCL 50 MG/ML IJ SOLN: 25.0000 mg | Freq: Four times a day (QID) | INTRAMUSCULAR | Status: DC | PRN

## 2018-11-11 MED ORDER — ACETAMINOPHEN 325 MG PO TABS: 650.0000 mg | ORAL_TABLET | Freq: Four times a day (QID) | ORAL | Status: DC | PRN

## 2018-11-11 MED ORDER — PIPERACILLIN-TAZOBACTAM 3.375 G IVPB 30 MIN
3.3750 g | Freq: Once | INTRAVENOUS | Status: AC
  Administered 2018-11-11: 16:00:00 3.375 g via INTRAVENOUS
  Filled 2018-11-11: qty 50

## 2018-11-11 NOTE — ED Triage Notes (Signed)
Pt reports she began having abd pain last night with no vomiting or diarrhea.  Continuous tube feed pt and had tube feed going until coming to hospital.

## 2018-11-11 NOTE — ED Notes (Signed)
Flush peg tube with 180 mls of tap water.

## 2018-11-11 NOTE — H&P (Addendum)
TRH H&P    Patient Demographics:    Monique Nguyen, is a 48 y.o. female  MRN: [id number]  DOB - Aug 07, 1970  Admit Date - 11/11/2018  Referring MD/NP/PA:  Waynard Reeds  Outpatient Primary MD for the patient is Patient, No Pcp Per Zenia Resides -GI at [facility]  Patient coming from:  home  Chief complaint- abdominal pain   HPI:    Monique Nguyen  is a 48 y.o. female, w hx of chronic pancreatitis, pancreatic pseudocyst, EGD 10/16/18=> negative for gastric outlet obstruction, 10/21/18 PEG placement,  h/o alcoholism in remission, severe protein calorie malnutrition, anxiety/ depression, w admisssion 5/3-5/12/20 for pancreatitis, 5/21-5/27/20 for pancreatitis/ aspiration pneumonitis, apparently c/o abdominal pain generalized.  Pt states that she has had since discharge and it just got worse. Pt notes n/v x1 (just water),  Pt denies fever, chills, cp, palp, sob, diarrhea, brbpr, dysuria, hematuria.     In ED,  T 99.1  P 130  R 19  Bp 143/88 pox 96% on RA Wt 52 kg  CT scan abd/ pelvis Pancreas: Previously seen fluid collection in the head of the pancreas which had measured 2.7 x 2.1 cm is increased in size to 3.4 x 4.2 cm. Also seen is a fluid collection anterior to the medial aspect of the left kidney measuring approximately 5.1 x 2.8 cm in the axial plane. Previously seen fluid collection anterior to the mid body of the pancreas on the most recent CT has markedly decreased in size.  IMPRESSION: Thickening of the walls of the distal stomach could be due to under distention but may also be secondary to gastritis.  Thickening of the walls of the ascending and proximal transverse colon could be due to colitis or under distension.  Increase in the size of a pseudocyst in the head of the pancreas. New pseudocyst in the left upper quadrant anterior to the left kidney is identified.   Wbc 12.6, Hgb 9.3, Plt 549     Na 137, K 3.9 Bun 22, Creatinine 0.33  Glucose 150  Pt treated with zosyn 3.375gm iv x1 , protonix 56m iv x1,  Oxycodone 549mpo x2 in ED.   Pt will be admitted for acute on chronic pancreatitis with pancreatic pseudocyst.      Review of systems:    In addition to the HPI above,  No Fever-chills, No Headache, No changes with Vision or hearing, No problems swallowing food or Liquids, No Chest pain, Cough or Shortness of Breath, No Nausea or Vomiting,  No Blood in stool or Urine, No dysuria, No new skin rashes or bruises, No new joints pains-aches,  No new weakness, tingling, numbness in any extremity, No recent weight gain or loss, No polyuria, polydypsia or polyphagia, No significant Mental Stressors.  All other systems reviewed and are negative.    Past History of the following :    Past Medical History:  Diagnosis Date   Alcohol abuse    Anxiety    Bacterial vaginosis    Chronic abdominal pain    Chronic pain  Depression    GERD (gastroesophageal reflux disease)    IBS (irritable bowel syndrome)    Pancreatitis    ETOH   Panic attacks    Psoriasis       Past Surgical History:  Procedure Laterality Date   BLADDER SURGERY     X 3   ESOPHAGOGASTRODUODENOSCOPY (EGD) WITH PROPOFOL N/A 10/16/2018   Procedure: ESOPHAGOGASTRODUODENOSCOPY (EGD) WITH PROPOFOL;  Surgeon: Daneil Dolin, MD;  Location: [facility];  Service: Endoscopy;  Laterality: N/A;   ESOPHAGOGASTRODUODENOSCOPY (EGD) WITH PROPOFOL N/A 10/22/2018   Procedure: ESOPHAGOGASTRODUODENOSCOPY (EGD) WITH PROPOFOL;  Surgeon: Virl Cagey, MD;  Location: [facility];  Service: General;  Laterality: N/A;   EUS N/A 10/11/2012   Dr. Ardis Hughs: 3.7 cm cystic lesion in body of pancreas with large amount of debris, s/p FNA with reddish, milky fluid, main pancreatic duct normal,    EUS  12/2015   mild to moderate chronic pancreatitis, pancreatic duct with dilation and intraductal stones, measuring  up to 6 mm in diameter, no abnormality in main bile duct, abnormal lymph nodes in perigastric region, largest measuring 12 mm in diameter, s/p fine needle biopsy. Pathology with multiple fragments of lymphoid tissue with granulomatous inflammation, negative GMS and AFB stains,   PEG PLACEMENT N/A 10/22/2018   Procedure: PERCUTANEOUS ENDOSCOPIC GASTROSTOMY (PEG) PLACEMENT with endoscopy;  Surgeon: Virl Cagey, MD;  Location: [facility];  Service: General;  Laterality: N/A;      Social History:      Social History   Tobacco Use   Smoking status: Current Some Day Smoker    Packs/day: 1.00    Years: 8.00    Pack years: 8.00    Types: Cigarettes   Smokeless tobacco: Never Used  Substance Use Topics   Alcohol use: Yes    Alcohol/week: 3.0 standard drinks    Types: 3 Cans of beer per week    Comment: last drink 06/05/18, hx of ETOH abuse       Family History :     Family History  Problem Relation Age of Onset   Depression Mother        living   Pancreatic cancer Father        PATIENT STATES PROSTATE CANCER. UNCLEAR IF ACTUAL PANCREATIC CANCER   Colon cancer Neg Hx    Colon polyps Neg Hx        Home Medications:   Prior to Admission medications   Medication Sig Start Date End Date Taking? Authorizing Provider  Amino Acids-Protein Hydrolys (FEEDING SUPPLEMENT, PRO-STAT SUGAR FREE 64,) LIQD Take 30 mLs by mouth 2 (two) times daily. 10/23/18  Yes Shah, Pratik D, DO  Calcium Carbonate Antacid (CALCIUM CARBONATE, DOSED IN MG ELEMENTAL CALCIUM,) 1250 MG/5ML SUSP Take 10 mLs (1,000 mg of elemental calcium total) by mouth 3 (three) times daily with meals for 30 days. 10/23/18 11/22/18 Yes Shah, Pratik D, DO  ferrous sulfate 300 (60 Fe) MG/5ML syrup Place 5 mLs (300 mg total) into feeding tube daily with breakfast for 30 days. 11/08/18 12/08/18 Yes Johnson, Clanford L, MD  folic acid (FOLVITE) 1 MG tablet Place 1 tablet (1 mg total) into feeding tube daily for 30 days. 11/08/18  12/08/18 Yes Johnson, Clanford L, MD  hydrOXYzine (ATARAX/VISTARIL) 25 MG tablet Place 1 tablet (25 mg total) into feeding tube 3 (three) times daily as needed for anxiety. 11/07/18  Yes Johnson, Clanford L, MD  imipramine (TOFRANIL) 10 MG tablet Place 1 tablet (10 mg total) into feeding tube  at bedtime. 11/07/18  Yes Johnson, Clanford L, MD  Nutritional Supplements (FEEDING SUPPLEMENT, OSMOLITE 1.5 CAL,) LIQD Place 1,000 mLs into feeding tube continuous. 10/23/18  Yes Shah, Pratik D, DO  oxyCODONE (ROXICODONE) 5 MG/5ML solution Place 5 mLs (5 mg total) into feeding tube every 4 (four) hours as needed for up to 7 days for severe pain. 11/07/18 11/14/18 Yes Johnson, Clanford L, MD  pantoprazole sodium (PROTONIX) 40 mg/20 mL PACK Place 20 mLs (40 mg total) into feeding tube 2 (two) times daily before a meal for 30 days. 11/07/18 12/07/18 Yes Johnson, Clanford L, MD  polyethylene glycol (MIRALAX / GLYCOLAX) 17 g packet Place 17 g into feeding tube daily as needed for up to 30 days for mild constipation. 11/07/18 12/07/18 Yes Johnson, Clanford L, MD  sertraline (ZOLOFT) 50 MG tablet Place 1 tablet (50 mg total) into feeding tube daily. 11/07/18  Yes Johnson, Clanford L, MD  thiamine 100 MG tablet Place 1 tablet (100 mg total) into feeding tube daily for 30 days. 11/08/18 12/08/18 Yes Murlean Iba, MD     Allergies:     Allergies  Allergen Reactions   Doxycycline Other (See Comments)    headaches   Sulfa Antibiotics Nausea And Vomiting and Other (See Comments)    Blurred vision and headaches     Physical Exam:   Vitals  Blood pressure (!) 143/88, pulse (!) 130, temperature 99.1 F (37.3 C), temperature source Oral, resp. rate 19, height '5\' 1"'  (1.549 m), weight 52 kg, last menstrual period 04/12/2013, SpO2 96 %.  1.  General: Axox3,    2. Psychiatric: euthymic  3. Neurologic: cn2-12 intact, reflexes 2+ symmetric, diffuse with downgoing toes bialterally, motor 5/5 in all 4 ext  4. HEENMT:    Anicteric, pupils 1.20m symmetric, direct, consensual, near intact Mucous membranes slightly dry Neck: no jvd, no bruit  5. Respiratory : CTAB  6. Cardiovascular : Tachy s1, s2, no m/g/r  7. Gastrointestinal:  Abd: soft, nt, nd, +bs,  PEG in place  8. Skin:  Ext: no c/c/e,  No rash, no erythema around PEG  9.Musculoskeletal:  Good ROM ,      No adenopathy    Data Review:    CBC Recent Labs  Lab 11/05/18 0548 11/06/18 0547 11/07/18 0611 11/11/18 1000  WBC 7.9 8.7 8.6 12.6*  HGB 7.8* 7.9* 8.2* 9.3*  HCT 25.3* 25.4* 26.8* 30.2*  PLT 603* 636* 636* 549*  MCV 97.3 98.4 98.2 97.4  MCH 30.0 30.6 30.0 30.0  MCHC 30.8 31.1 30.6 30.8  RDW 15.6* 15.8* 16.3* 17.5*  LYMPHSABS  --   --   --  0.7  MONOABS  --   --   --  0.8  EOSABS  --   --   --  0.4  BASOSABS  --   --   --  0.0   ------------------------------------------------------------------------------------------------------------------  Results for orders placed or performed during the hospital encounter of 11/11/18 (from the past 48 hour(s))  Urinalysis, Routine w reflex microscopic     Status: Abnormal   Collection Time: 11/11/18  9:37 AM  Result Value Ref Range   Color, Urine YELLOW YELLOW   APPearance HAZY (A) CLEAR   Specific Gravity, Urine 1.014 1.005 - 1.030   pH 8.0 5.0 - 8.0   Glucose, UA NEGATIVE NEGATIVE mg/dL   Hgb urine dipstick NEGATIVE NEGATIVE   Bilirubin Urine NEGATIVE NEGATIVE   Ketones, ur NEGATIVE NEGATIVE mg/dL   Protein, ur NEGATIVE NEGATIVE mg/dL  Nitrite NEGATIVE NEGATIVE   Leukocytes,Ua NEGATIVE NEGATIVE    Comment: Performed at [facility], [address] [place], [place] [address]  CBC with Differential     Status: Abnormal   Collection Time: 11/11/18 10:00 AM  Result Value Ref Range   WBC 12.6 (H) 4.0 - 10.5 K/uL   RBC 3.10 (L) 3.87 - 5.11 MIL/uL   Hemoglobin 9.3 (L) 12.0 - 15.0 g/dL   HCT 30.2 (L) 36.0 - 46.0 %   MCV 97.4 80.0 - 100.0 fL   MCH 30.0 26.0 - 34.0 pg   MCHC  30.8 30.0 - 36.0 g/dL   RDW 17.5 (H) 11.5 - 15.5 %   Platelets 549 (H) 150 - 400 K/uL   nRBC 0.0 0.0 - 0.2 %   Neutrophils Relative % 83 %   Neutro Abs 10.5 (H) 1.7 - 7.7 K/uL   Lymphocytes Relative 6 %   Lymphs Abs 0.7 0.7 - 4.0 K/uL   Monocytes Relative 6 %   Monocytes Absolute 0.8 0.1 - 1.0 K/uL   Eosinophils Relative 3 %   Eosinophils Absolute 0.4 0.0 - 0.5 K/uL   Basophils Relative 0 %   Basophils Absolute 0.0 0.0 - 0.1 K/uL   Immature Granulocytes 2 %   Abs Immature Granulocytes 0.20 (H) 0.00 - 0.07 K/uL    Comment: Performed at [facility] [address], [place], [place] [address]  Comprehensive metabolic panel     Status: Abnormal   Collection Time: 11/11/18 10:00 AM  Result Value Ref Range   Sodium 137 135 - 145 mmol/L   Potassium 3.9 3.5 - 5.1 mmol/L   Chloride 98 98 - 111 mmol/L   CO2 25 22 - 32 mmol/L   Glucose, Bld 150 (H) 70 - 99 mg/dL   BUN 22 (H) 6 - 20 mg/dL   Creatinine, Ser 0.33 (L) 0.44 - 1.00 mg/dL   Calcium 8.8 (L) 8.9 - 10.3 mg/dL   Total Protein 7.1 6.5 - 8.1 g/dL   Albumin 2.9 (L) 3.5 - 5.0 g/dL   AST 34 15 - 41 U/L   ALT 36 0 - 44 U/L   Alkaline Phosphatase 114 38 - 126 U/L   Total Bilirubin 0.5 0.3 - 1.2 mg/dL   GFR calc non Af Amer >60 >60 mL/min   GFR calc Af Amer >60 >60 mL/min   Anion gap 14 5 - 15    Comment: Performed at [facility], [address], [place], [place] [address]  Lipase, blood     Status: Abnormal   Collection Time: 11/11/18 10:00 AM  Result Value Ref Range   Lipase 340 (H) 11 - 51 U/L    Comment: Performed at [facility], [address] [place], [place] [address]    Chemistries  Recent Labs  Lab 11/05/18 [id number] 11/06/18 0547 11/07/18 0611 11/11/18 1000  NA 139 139 139 137  K 3.2* 3.8 4.0 3.9  CL 98 101 102 98  CO2 31 32 27 25  GLUCOSE 152* 125* 167* 150*  BUN '13 9 7 ' 22*  CREATININE <0.30* <0.30* <0.30* 0.33*  CALCIUM 7.6* 7.5* 7.8* 8.8*  MG 2.1 1.9 2.0  --   AST '19 20 30 ' 34  ALT 32 27 28 36  ALKPHOS 117  108 110 114  BILITOT 0.3 0.3 0.3 0.5   ------------------------------------------------------------------------------------------------------------------  ------------------------------------------------------------------------------------------------------------------ GFR: Estimated Creatinine Clearance: 64.9 mL/min (A) (by C-G formula based on SCr of 0.33 mg/dL (L)). Liver Function Tests: Recent Labs  Lab 11/05/18  1017 11/06/18 0547 11/07/18 0611 11/11/18 1000  AST '19 20 30 ' 34  ALT 32 27 28 36  ALKPHOS 117 108 110 114  BILITOT 0.3 0.3 0.3 0.5  PROT 4.6* 4.8* 5.1* 7.1  ALBUMIN 1.7* 1.7* 2.0* 2.9*   Recent Labs  Lab 11/06/18 0547 11/11/18 1000  LIPASE 137* 340*   No results for input(s): AMMONIA in the last 168 hours. Coagulation Profile: No results for input(s): INR, PROTIME in the last 168 hours. Cardiac Enzymes: No results for input(s): CKTOTAL, CKMB, CKMBINDEX, TROPONINI in the last 168 hours. BNP (last 3 results) No results for input(s): PROBNP in the last 8760 hours. HbA1C: No results for input(s): HGBA1C in the last 72 hours. CBG: No results for input(s): GLUCAP in the last 168 hours. Lipid Profile: No results for input(s): CHOL, HDL, LDLCALC, TRIG, CHOLHDL, LDLDIRECT in the last 72 hours. Thyroid Function Tests: No results for input(s): TSH, T4TOTAL, FREET4, T3FREE, THYROIDAB in the last 72 hours. Anemia Panel: No results for input(s): VITAMINB12, FOLATE, FERRITIN, TIBC, IRON, RETICCTPCT in the last 72 hours.  --------------------------------------------------------------------------------------------------------------- Urine analysis:    Component Value Date/Time   COLORURINE YELLOW 11/11/2018 0937   APPEARANCEUR HAZY (A) 11/11/2018 0937   LABSPEC 1.014 11/11/2018 0937   PHURINE 8.0 11/11/2018 0937   GLUCOSEU NEGATIVE 11/11/2018 0937   HGBUR NEGATIVE 11/11/2018 0937   BILIRUBINUR NEGATIVE 11/11/2018 0937   KETONESUR NEGATIVE 11/11/2018 0937    PROTEINUR NEGATIVE 11/11/2018 0937   UROBILINOGEN 0.2 03/06/2015 1558   NITRITE NEGATIVE 11/11/2018 0937   LEUKOCYTESUR NEGATIVE 11/11/2018 5102      Imaging Results:    Ct Abdomen Pelvis W Contrast  Result Date: 11/11/2018 CLINICAL DATA:  Abdominal pain since last night. EXAM: CT ABDOMEN AND PELVIS WITH CONTRAST TECHNIQUE: Multidetector CT imaging of the abdomen and pelvis was performed using the standard protocol following bolus administration of intravenous contrast. CONTRAST:  75 mL OMNIPAQUE IOHEXOL 300 MG/ML  SOLN COMPARISON:  CT abdomen and pelvis 10/31/2017 and 10/14/2018. FINDINGS: Lower chest: Dependent atelectasis right lung base noted. No pleural or pericardial effusion. Hepatobiliary: No focal liver abnormality is seen. No gallstones, gallbladder wall thickening, or biliary dilatation. Pancreas: Previously seen fluid collection in the head of the pancreas which had measured 2.7 x 2.1 cm is increased in size to 3.4 x 4.2 cm. Also seen is a fluid collection anterior to the medial aspect of the left kidney measuring approximately 5.1 x 2.8 cm in the axial plane. Previously seen fluid collection anterior to the mid body of the pancreas on the most recent CT has markedly decreased in size. Spleen: Normal in size without focal abnormality. Adrenals/Urinary Tract: Cyst upper pole left kidney is noted. The kidneys are otherwise unremarkable. Ureters and urinary bladder appear normal. The adrenal glands are normal appearance. Stomach/Bowel: Peg tube is in place as on the most recent examination. Although decompressed, the walls of the distal stomach are thickened. There is no evidence of bowel obstruction. Wall thickening of the ascending and proximal transverse colon could be due to under distention or colitis. The colon is otherwise unremarkable. No pneumatosis, portal venous gas or free air. Vascular/Lymphatic: Aortic atherosclerosis. No enlarged abdominal or pelvic lymph nodes. Reproductive:  Uterus and bilateral adnexa are unremarkable. Other: Small volume of hemoperitoneum seen in the pelvis on the 10/14/2018 stay demonstrates evolutionary change with some wall formation posteriorly and decreased in attenuation. Musculoskeletal: No acute abnormality. IMPRESSION: Thickening of the walls of the distal stomach could be due to under distention but  may also be secondary to gastritis. Thickening of the walls of the ascending and proximal transverse colon could be due to colitis or under distension. Increase in the size of a pseudocyst in the head of the pancreas. New pseudocyst in the left upper quadrant anterior to the left kidney is identified. Electronically Signed   By: Inge Rise M.D.   On: 11/11/2018 15:09       Assessment & Plan:    Principal Problem:   Pancreatitis Active Problems:   Nausea & vomiting   GERD (gastroesophageal reflux disease)   Pancreatic pseudocyst   Leukocytosis   Anemia   Pancreatitis, Acute on Chronic Pancreatic pseudocyst Pt received zosyn 3.375gm iv x1 in ED Defer to GI whether to continue with abx NPO Hydrate with ns at 155m per hour Check cbc, cmp, lipase in am GI consult ordered in computer, appreciate input  Tachycardia ? Due to pain Add trop-I to labs,  Check 12 lead EKG  Leukocytosis Check cbc in am  RML 285mnodule on CT abd/ pelvis 10/14/18 Consider CT chest in 12 months per radiology  GeJerrye Bushyrotonix [id number] bid  Anemia Check tsh, esr prior admission 5/3, iron saturation 12%, ferritin elevated,  b12 borderline low 324110ld iron, folic acid temporarily Check cbc in am  Tobacco abuse Nicotine patch 35m60mpically qday prn craving  ETOH abuse in past Pt states had not drank in 2mon44monthight have taken a shot the past 2 days due to pain.  Please monitor for withdrawal  Severe protein calorie malnutrition Prostat once pancreatitis improves   Anxiety/ Depression Hold zoloft Ativan 0.5mg i62m6h prn  DVT Prophylaxis-    Lovenox - SCDs   AM Labs Ordered, also please review Full Orders  Family Communication: Admission, patients condition and plan of care including tests being ordered have been discussed with the patient  who indicate understanding and agree with the plan and Code Status.  Code Status:  FULL CODE  Admission status: Inpatient: Based on patients clinical presentation and evaluation of above clinical data, I have made determination that patient meets Inpatient criteria at this time.  Pt will require Ns iv and iv pain medications and NPO status for a prolonged period of time due to recurrent acute on chronic pancreatitis complicated by enlarging pseudocyst. Pt will require > 2 nites stay.   Time spent in minutes : 70   Jahmai Finelli Jani Graveln 11/11/2018 at 4:41 PM

## 2018-11-11 NOTE — ED Provider Notes (Signed)
[facility] EMERGENCY DEPARTMENT Provider Note   CSN: [id number] Arrival date & time: 11/11/18  8295    History   Chief Complaint Chief Complaint  Patient presents with  . Abdominal Pain    HPI Monique Nguyen is a 48 y.o. female.     Patient is a 48 year old female who presents to the emergency department with a complaint of abdominal pain.  The patient states that she was discharged from the hospital on May 27 because of abdominal pain.  She began having abdominal pain that was increased from her previous pain 2 days ago.  The pain is similar to what she experienced when she was hospitalized, but seems to be more intense.  She had been prescribed oxycodone liquid.  She ran out of the medication on May 29.  She says that she did not rest well at all on Saturday Friday night or Saturday night.  She is not having vomiting.  She is not having diarrhea she is having some constipation.  It is of note that the patient has a feeding tube.  Patient has a history of pancreatitis, alcohol abuse, anxiety, chronic abdominal pain, irritable bowel syndrome, sepsis, and panic attacks.  The patient was also evaluated for pseudocyst involving the pancreas and failure to thrive.  The patient presents now for assistance with her pain and evaluation.  The history is provided by the patient.  Abdominal Pain  Associated symptoms: constipation   Associated symptoms: no chest pain, no cough, no dysuria, no hematuria and no shortness of breath     Past Medical History:  Diagnosis Date  . Alcohol abuse   . Anxiety   . Bacterial vaginosis   . Chronic abdominal pain   . Chronic pain   . Depression   . GERD (gastroesophageal reflux disease)   . IBS (irritable bowel syndrome)   . Pancreatitis    ETOH  . Panic attacks   . Psoriasis     Patient Active Problem List   Diagnosis Date Noted  . Elevated lipase   . Sepsis (HCC)   . Pressure injury of skin 11/02/2018  . Generalized abdominal pain    . Pancreatic pseudocyst 11/01/2018  . Odynophagia   . Protein-calorie malnutrition, severe 10/16/2018  . Chronic pancreatitis (HCC)   . Dysphagia   . Acute on chronic pancreatitis (HCC) 10/14/2018  . Adult failure to thrive 10/14/2018  . GERD (gastroesophageal reflux disease) 10/14/2018  . Dehydration 10/14/2018  . 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 Facial cellulitis 09/10/2012  . Insect bite 09/10/2012  . Depression 09/10/2012  . Anxiety 09/10/2012  . Hypokalemia 09/10/2012  . Tobacco abuse 09/10/2012  . Alcohol dependence (HCC) 06/28/2012  . Benzodiazepine abuse, continuous (HCC) 06/28/2012  . Suicide threat or attempt 06/28/2012    Class: Acute  . 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 Diarrhea 11/11/2010  . Nausea 11/11/2010  . Vaginal discharge 11/11/2010    Past Surgical History:  Procedure Laterality Date  . BLADDER SURGERY     X 3  . ESOPHAGOGASTRODUODENOSCOPY (EGD) WITH PROPOFOL N/A 10/16/2018   Procedure: ESOPHAGOGASTRODUODENOSCOPY (EGD) WITH PROPOFOL;  Surgeon: Corbin Ade, MD;  Location: [facility];  Service: Endoscopy;  Laterality: N/A;  . ESOPHAGOGASTRODUODENOSCOPY (EGD) WITH PROPOFOL N/A 10/22/2018   Procedure: ESOPHAGOGASTRODUODENOSCOPY (EGD) WITH PROPOFOL;  Surgeon: Lucretia Roers, MD;  Location: [facility];  Service: General;  Laterality: N/A;  . EUS N/A 10/11/2012   Dr. Christella Hartigan: 3.7 cm cystic lesion in  body of pancreas with large amount of debris, s/p FNA with reddish, milky fluid, main pancreatic duct normal,   . EUS  12/2015   mild to moderate chronic pancreatitis, pancreatic duct with dilation and intraductal stones, measuring up to 6 mm in diameter, no abnormality in main bile duct, abnormal lymph nodes in perigastric region, largest measuring 12 mm in diameter, s/p fine needle biopsy. Pathology with multiple fragments of lymphoid tissue with granulomatous inflammation, negative GMS and AFB stains,  . PEG  PLACEMENT N/A 10/22/2018   Procedure: PERCUTANEOUS ENDOSCOPIC GASTROSTOMY (PEG) PLACEMENT with endoscopy;  Surgeon: Lucretia Roers, MD;  Location: [facility];  Service: General;  Laterality: N/A;     OB History   No obstetric history on file.      Home Medications    Prior to Admission medications   Medication Sig Start Date End Date Taking? Authorizing Provider  Amino Acids-Protein Hydrolys (FEEDING SUPPLEMENT, PRO-STAT SUGAR FREE 64,) LIQD Take 30 mLs by mouth 2 (two) times daily. 10/23/18   Sherryll Burger, Pratik D, DO  Calcium Carbonate Antacid (CALCIUM CARBONATE, DOSED IN MG ELEMENTAL CALCIUM,) 1250 MG/5ML SUSP Take 10 mLs (1,000 mg of elemental calcium total) by mouth 3 (three) times daily with meals for 30 days. 10/23/18 11/22/18  Sherryll Burger, Pratik D, DO  ferrous sulfate 300 (60 Fe) MG/5ML syrup Place 5 mLs (300 mg total) into feeding tube daily with breakfast for 30 days. 11/08/18 12/08/18  Johnson, Clanford L, MD  folic acid (FOLVITE) 1 MG tablet Place 1 tablet (1 mg total) into feeding tube daily for 30 days. 11/08/18 12/08/18  Cleora Fleet, MD  hydrOXYzine (ATARAX/VISTARIL) 25 MG tablet Place 1 tablet (25 mg total) into feeding tube 3 (three) times daily as needed for anxiety. 11/07/18   Johnson, Clanford L, MD  imipramine (TOFRANIL) 10 MG tablet Place 1 tablet (10 mg total) into feeding tube at bedtime. 11/07/18   Johnson, Clanford L, MD  Nutritional Supplements (FEEDING SUPPLEMENT, OSMOLITE 1.5 CAL,) LIQD Place 1,000 mLs into feeding tube continuous. 10/23/18   Sherryll Burger, Pratik D, DO  oxyCODONE (ROXICODONE) 5 MG/5ML solution Place 5 mLs (5 mg total) into feeding tube every 4 (four) hours as needed for up to 7 days for severe pain. 11/07/18 11/14/18  Johnson, Clanford L, MD  pantoprazole sodium (PROTONIX) 40 mg/20 mL PACK Place 20 mLs (40 mg total) into feeding tube 2 (two) times daily before a meal for 30 days. 11/07/18 12/07/18  Johnson, Clanford L, MD  polyethylene glycol (MIRALAX / GLYCOLAX) 17 g packet  Place 17 g into feeding tube daily as needed for up to 30 days for mild constipation. 11/07/18 12/07/18  Johnson, Clanford L, MD  sertraline (ZOLOFT) 50 MG tablet Place 1 tablet (50 mg total) into feeding tube daily. 11/07/18   Johnson, Clanford L, MD  thiamine 100 MG tablet Place 1 tablet (100 mg total) into feeding tube daily for 30 days. 11/08/18 12/08/18  Cleora Fleet, MD    Family History Family History  Problem Relation Age of Onset  . Depression Mother        living  . Pancreatic cancer Father        PATIENT STATES PROSTATE CANCER. UNCLEAR IF ACTUAL PANCREATIC CANCER  . Colon cancer Neg Hx   . Colon polyps Neg Hx     Social History Social History   Tobacco Use  . Smoking status: Current Some Day Smoker    Packs/day: 1.00    Years: 8.00    Pack  years: 8.00    Types: Cigarettes  . Smokeless tobacco: Never Used  Substance Use Topics  . Alcohol use: Yes    Alcohol/week: 3.0 standard drinks    Types: 3 Cans of beer per week    Comment: last drink 06/05/18, hx of ETOH abuse  . Drug use: Not Currently    Types: Marijuana    Comment: last time used 1` month ago. DENIED DRUG USE  on 10/15/2018     Allergies   Doxycycline and Sulfa antibiotics   Review of Systems Review of Systems  Constitutional: Negative for activity change.       All ROS Neg except as noted in HPI  HENT: Negative for nosebleeds.   Eyes: Negative for photophobia and discharge.  Respiratory: Negative for cough, shortness of breath and wheezing.   Cardiovascular: Negative for chest pain and palpitations.  Gastrointestinal: Positive for abdominal pain and constipation. Negative for blood in stool.  Genitourinary: Negative for dysuria, frequency and hematuria.  Musculoskeletal: Negative for arthralgias, back pain and neck pain.  Skin: Negative.   Neurological: Negative for dizziness, seizures and speech difficulty.  Psychiatric/Behavioral: Negative for confusion and hallucinations.     Physical  Exam Updated Vital Signs BP 136/87 (BP Location: Right Arm)   Pulse (!) 118   Temp 98 F (36.7 C) (Oral)   Resp 20   Ht 5\' 1"  (1.549 m)   Wt 52 kg   LMP 04/12/2013   SpO2 97%   BMI 21.66 kg/m   Physical Exam Vitals signs and nursing note reviewed.  Constitutional:      Appearance: She is well-developed. She is not toxic-appearing.  HENT:     Head: Normocephalic.     Right Ear: Tympanic membrane and external ear normal.     Left Ear: Tympanic membrane and external ear normal.  Eyes:     General: Lids are normal.     Pupils: Pupils are equal, round, and reactive to light.  Neck:     Musculoskeletal: Normal range of motion and neck supple.     Vascular: No carotid bruit.  Cardiovascular:     Rate and Rhythm: Regular rhythm. Tachycardia present.     Pulses: Normal pulses.     Heart sounds: Normal heart sounds.  Pulmonary:     Effort: No respiratory distress.     Breath sounds: Normal breath sounds.  Abdominal:     General: Bowel sounds are normal.     Palpations: Abdomen is soft.     Tenderness: There is abdominal tenderness in the periumbilical area. There is no guarding.     Comments: Abdomen is soft and flat.  Bowel sounds are present and active.  Patient seems to be most tender near the periumbilical area and feeding tube area.  There is no increased redness or drainage from the feeding tube.  The feeding tube seems to be functioning just fine and irrigates well.  Musculoskeletal: Normal range of motion.  Lymphadenopathy:     Head:     Right side of head: No submandibular adenopathy.     Left side of head: No submandibular adenopathy.     Cervical: No cervical adenopathy.  Skin:    General: Skin is warm and dry.  Neurological:     Mental Status: She is alert and oriented to person, place, and time.     Cranial Nerves: No cranial nerve deficit.     Sensory: No sensory deficit.  Psychiatric:        Mood  and Affect: Mood is anxious.        Speech: Speech normal.       ED Treatments / Results  Labs (all labs ordered are listed, but only abnormal results are displayed) Labs Reviewed  CBC WITH DIFFERENTIAL/PLATELET  COMPREHENSIVE METABOLIC PANEL  LIPASE, BLOOD  URINALYSIS, ROUTINE W REFLEX MICROSCOPIC    EKG None  Radiology No results found.  Procedures Procedures (including critical care time)  Medications Ordered in ED Medications  oxyCODONE (ROXICODONE) 5 MG/5ML solution 5 mg (has no administration in time range)  promethazine (PHENERGAN) tablet 12.5 mg (has no administration in time range)     Initial Impression / Assessment and Plan / ED Course  I have reviewed the triage vital signs and the nursing notes.  Pertinent labs & imaging results that were available during my care of the patient were reviewed by me and considered in my medical decision making (see chart for details).          Final Clinical Impressions(s) / ED Diagnoses MDM I have reviewed previous hospital and emergency department records.  Pulse rate elevated, otherwise vital signs within normal limits.  Pulse oximetry is 97% on room air.  Within normal limits by my interpretation.  Patient has a tachycardia and this what she describes as escalating pain.  We will check a complete blood count, comprehensive metabolic panel, lipase.  Vital signs are otherwise within normal limits.  Medication for pain given.  Complete blood count shows the white blood cells to be elevated at 12,600, this is elevated from the previous study at discharge from the hospital.  The hemoglobin is low at 9.3 the hematocrit is low at 30.3.  There is no shift to the left.  Platelets are elevated at 549,000.  The comprehensive metabolic panel shows a glucose to be elevated at 150, the BUN is elevated at 22, the creatinine is low at 0.33.  This is the patient's usual range for her creatinine.  The albumin is low at 2.9.  The hepatic function studies are within normal limits. The lipase is  elevated at 340.  The urine analysis is nonacute.  Case discussed with Dr. Clarene Duke.  Will obtain a CT scanning of the abdomen given the elevation in white blood cell count and changes in the lipase and patient's continued complaint of epigastric area pain near the feeding tube.  Informed by CT that the CT scan will be delayed due to patient will have to receive oral contrast through her feeding tube per protocol.  The CT scan shows some thickening of the wall of the stomach consistent with gastritis.  The CT scan also shows some thickening of the proximal and a sending transverse colon consistent with colitis.  The pseudocyst of the head of the pancreas has increased.  Patient started on Zosyn.  IV Pepcid and Protonix also started.  Case discussed with Dr Arizona Constable. Case discussed with Dr. Pearson Grippe with [facility] hospital[facility].  He will see the patient for admission.   Final diagnoses:  Pancreatitis, recurrent  Colitis  Other acute gastritis without hemorrhage    ED Discharge Orders    None       Ivery Quale, PA-C 11/11/18 1555    Samuel Jester, DO 11/15/18 1756

## 2018-11-11 NOTE — Plan of Care (Signed)
  Problem: Health Behavior/Discharge Planning: Goal: Ability to manage health-related needs will improve Outcome: Not Progressing   Problem: Activity: Goal: Risk for activity intolerance will decrease Outcome: Not Progressing   Problem: Nutrition: Goal: Adequate nutrition will be maintained Outcome: Not Progressing   

## 2018-11-11 NOTE — ED Notes (Signed)
ED TO INPATIENT HANDOFF REPORT  ED Nurse Name and Phone #: C Jaquanda Wickersham  S Name/Age/Gender Monique Nguyen 48 y.o. female Room/Bed: APA08/APA08  Code Status   Code Status: Full Code  Home/SNF/Other Home Patient oriented to: self, place, time and situation Is this baseline? Yes   Triage Complete: Triage complete  Chief Complaint Chronic Pancreatitis  Triage Note Pt reports she began having abd pain last night with no vomiting or diarrhea.  Continuous tube feed pt and had tube feed going until coming to hospital.     Allergies Allergies  Allergen Reactions  . Doxycycline Other (See Comments)    headaches  . Sulfa Antibiotics Nausea And Vomiting and Other (See Comments)    Blurred vision and headaches    Level of Care/Admitting Diagnosis ED Disposition    ED Disposition Condition Comment   Admit  Hospital Area: [facility] [[id number]]  Level of Care: Telemetry [5]  Covid Evaluation: Confirmed COVID Negative  Diagnosis: Acute pancreatitis [577.0.ICD-9-CM]  Admitting Physician: Pearson Grippe [[id number]]  Attending Physician: Pearson Grippe [phone]  Estimated length of stay: past midnight tomorrow  Certification:: I certify this patient will need inpatient services for at least 2 midnights  PT Class (Do Not Modify): Inpatient [101]  PT Acc Code (Do Not Modify): Private [1]       B Medical/Surgery History Past Medical History:  Diagnosis Date  . Alcohol abuse   . Anxiety   . Bacterial vaginosis   . Chronic abdominal pain   . Chronic pain   . Depression   . GERD (gastroesophageal reflux disease)   . IBS (irritable bowel syndrome)   . Pancreatitis    ETOH  . Panic attacks   . Psoriasis    Past Surgical History:  Procedure Laterality Date  . BLADDER SURGERY     X 3  . ESOPHAGOGASTRODUODENOSCOPY (EGD) WITH PROPOFOL N/A 10/16/2018   Procedure: ESOPHAGOGASTRODUODENOSCOPY (EGD) WITH PROPOFOL;  Surgeon: Corbin Ade, MD;  [facility];  Service: Endoscopy;   Laterality: N/A;  . ESOPHAGOGASTRODUODENOSCOPY (EGD) WITH PROPOFOL N/A 10/22/2018   Procedure: ESOPHAGOGASTRODUODENOSCOPY (EGD) WITH PROPOFOL;  Surgeon: Lucretia Roers, MD;  Location: [facility] ORS;  Service: General;  Laterality: N/A;  . EUS N/A 10/11/2012   Dr. Christella Hartigan: 3.7 cm cystic lesion in body of pancreas with large amount of debris, s/p FNA with reddish, milky fluid, main pancreatic duct normal,   . EUS  12/2015   mild to moderate chronic pancreatitis, pancreatic duct with dilation and intraductal stones, measuring up to 6 mm in diameter, no abnormality in main bile duct, abnormal lymph nodes in perigastric region, largest measuring 12 mm in diameter, s/p fine needle biopsy. Pathology with multiple fragments of lymphoid tissue with granulomatous inflammation, negative GMS and AFB stains,  . PEG PLACEMENT N/A 10/22/2018   Procedure: PERCUTANEOUS ENDOSCOPIC GASTROSTOMY (PEG) PLACEMENT with endoscopy;  Surgeon: Lucretia Roers, MD;  Location: [facility] ORS;  Service: General;  Laterality: N/A;     A IV Location/Drains/Wounds Patient Lines/Drains/Airways Status   Active Line/Drains/Airways    Name:   Placement date:   Placement time:   Site:   Days:   Peripheral IV 11/11/18 Left Hand   11/11/18    1024    Hand   less than 1   Gastrostomy/Enterostomy Percutaneous endoscopic gastrostomy (PEG) 20 Fr. LUQ   10/22/18    0956    LUQ   20   Incision (Closed) 10/22/18 Abdomen Left   10/22/18  1005     20   Pressure Injury 11/02/18 Stage I -  Intact skin with non-blanchable redness of a localized area usually over a bony prominence.   11/02/18    0049     9   Pressure Injury 11/02/18 Stage II -  Partial thickness loss of dermis presenting as a shallow open ulcer with a red, pink wound bed without slough. redden open area    11/02/18    0800     9          Intake/Output Last 24 hours  Intake/Output Summary (Last 24 hours) at 11/11/2018 1934 Last data filed at 11/11/2018 1710 Gross per 24 hour  Intake  100 ml  Output -  Net 100 ml    Labs/Imaging Results for orders placed or performed during the hospital encounter of 11/11/18 (from the past 48 hour(s))  Urinalysis, Routine w reflex microscopic     Status: Abnormal   Collection Time: 11/11/18  9:37 AM  Result Value Ref Range   Color, Urine YELLOW YELLOW   APPearance HAZY (A) CLEAR   Specific Gravity, Urine 1.014 1.005 - 1.030   pH 8.0 5.0 - 8.0   Glucose, UA NEGATIVE NEGATIVE mg/dL   Hgb urine dipstick NEGATIVE NEGATIVE   Bilirubin Urine NEGATIVE NEGATIVE   Ketones, ur NEGATIVE NEGATIVE mg/dL   Protein, ur NEGATIVE NEGATIVE mg/dL   Nitrite NEGATIVE NEGATIVE   Leukocytes,Ua NEGATIVE NEGATIVE    Comment: Performed at [facility], [address] [place], [place] [address]  CBC with Differential     Status: Abnormal   Collection Time: 11/11/18 10:00 AM  Result Value Ref Range   WBC 12.6 (H) 4.0 - 10.5 K/uL   RBC 3.10 (L) 3.87 - 5.11 MIL/uL   Hemoglobin 9.3 (L) 12.0 - 15.0 g/dL   HCT 60.4 (L) 54.0 - 98.1 %   MCV 97.4 80.0 - 100.0 fL   MCH 30.0 26.0 - 34.0 pg   MCHC 30.8 30.0 - 36.0 g/dL   RDW 19.1 (H) 47.8 - 29.5 %   Platelets 549 (H) 150 - 400 K/uL   nRBC 0.0 0.0 - 0.2 %   Neutrophils Relative % 83 %   Neutro Abs 10.5 (H) 1.7 - 7.7 K/uL   Lymphocytes Relative 6 %   Lymphs Abs 0.7 0.7 - 4.0 K/uL   Monocytes Relative 6 %   Monocytes Absolute 0.8 0.1 - 1.0 K/uL   Eosinophils Relative 3 %   Eosinophils Absolute 0.4 0.0 - 0.5 K/uL   Basophils Relative 0 %   Basophils Absolute 0.0 0.0 - 0.1 K/uL   Immature Granulocytes 2 %   Abs Immature Granulocytes 0.20 (H) 0.00 - 0.07 K/uL    Comment: Performed at [facility], [address], [place], [place] [address]  Comprehensive metabolic panel     Status: Abnormal   Collection Time: 11/11/18 10:00 AM  Result Value Ref Range   Sodium 137 135 - 145 mmol/L   Potassium 3.9 3.5 - 5.1 mmol/L   Chloride 98 98 - 111 mmol/L   CO2 25 22 - 32 mmol/L   Glucose, Bld 150 (H) 70 - 99 mg/dL    BUN 22 (H) 6 - 20 mg/dL   Creatinine, Ser 8.65 (L) 0.44 - 1.00 mg/dL   Calcium 8.8 (L) 8.9 - 10.3 mg/dL   Total Protein 7.1 6.5 - 8.1 g/dL   Albumin 2.9 (L) 3.5 - 5.0 g/dL   AST 34 15 - 41 U/L  ALT 36 0 - 44 U/L   Alkaline Phosphatase 114 38 - 126 U/L   Total Bilirubin 0.5 0.3 - 1.2 mg/dL   GFR calc non Af Amer >60 >60 mL/min   GFR calc Af Amer >60 >60 mL/min   Anion gap 14 5 - 15    Comment: Performed at [facility] [address] [place], [place] [address]  Lipase, blood     Status: Abnormal   Collection Time: 11/11/18 10:00 AM  Result Value Ref Range   Lipase 340 (H) 11 - 51 U/L    Comment: Performed at [facility], [address] [place], [place] [address]  Troponin I - Add-On to previous collection     Status: None   Collection Time: 11/11/18  4:20 PM  Result Value Ref Range   Troponin I <0.03 <0.03 ng/mL    Comment: Performed at [facility], [address], [place], [place] [address]  SARS Coronavirus 2 (CEPHEID - Performed in [facility] hospital lab), Hosp Order     Status: None   Collection Time: 11/11/18  5:25 PM  Result Value Ref Range   SARS Coronavirus 2 NEGATIVE NEGATIVE    Comment: (NOTE) If result is NEGATIVE SARS-CoV-2 target nucleic acids are NOT DETECTED. The SARS-CoV-2 RNA is generally detectable in upper and lower  respiratory specimens during the acute phase of infection. The lowest  concentration of SARS-CoV-2 viral copies this assay can detect is 250  copies / mL. A negative result does not preclude SARS-CoV-2 infection  and should not be used as the sole basis for treatment or other  patient management decisions.  A negative result may occur with  improper specimen collection / handling, submission of specimen other  than nasopharyngeal swab, presence of viral mutation(s) within the  areas targeted by this assay, and inadequate number of viral copies  (<250 copies / mL). A negative result must be combined with clinical  observations, patient  history, and epidemiological information. If result is POSITIVE SARS-CoV-2 target nucleic acids are DETECTED. The SARS-CoV-2 RNA is generally detectable in upper and lower  respiratory specimens dur ing the acute phase of infection.  Positive  results are indicative of active infection with SARS-CoV-2.  Clinical  correlation with patient history and other diagnostic information is  necessary to determine patient infection status.  Positive results do  not rule out bacterial infection or co-infection with other viruses. If result is PRESUMPTIVE POSTIVE SARS-CoV-2 nucleic acids MAY BE PRESENT.   A presumptive positive result was obtained on the submitted specimen  and confirmed on repeat testing.  While 2019 novel coronavirus  (SARS-CoV-2) nucleic acids may be present in the submitted sample  additional confirmatory testing may be necessary for epidemiological  and / or clinical management purposes  to differentiate between  SARS-CoV-2 and other Sarbecovirus currently known to infect humans.  If clinically indicated additional testing with an alternate test  methodology [phone]) is advised. The SARS-CoV-2 RNA is generally  detectable in upper and lower respiratory sp ecimens during the acute  phase of infection. The expected result is Negative. Fact Sheet for Patients:  BoilerBrush.com.cy Fact Sheet for Healthcare Providers: https://pope.com/ This test is not yet approved or cleared by the [place] FDA and has been authorized for detection and/or diagnosis of SARS-CoV-2 by FDA under an Emergency Use Authorization (EUA).  This EUA will remain in effect (meaning this test can be used) for the duration of the COVID-19 declaration under Section 564(b)(1) of the Act, 21  U.S.C. section 360bbb-3(b)(1), unless the authorization is terminated or revoked sooner. Performed at [facility], [address] [place], [place] [address]    Ct  Abdomen Pelvis W Contrast  Result Date: 11/11/2018 CLINICAL DATA:  Abdominal pain since last night. EXAM: CT ABDOMEN AND PELVIS WITH CONTRAST TECHNIQUE: Multidetector CT imaging of the abdomen and pelvis was performed using the standard protocol following bolus administration of intravenous contrast. CONTRAST:  75 mL OMNIPAQUE IOHEXOL 300 MG/ML  SOLN COMPARISON:  CT abdomen and pelvis 10/31/2017 and 10/14/2018. FINDINGS: Lower chest: Dependent atelectasis right lung base noted. No pleural or pericardial effusion. Hepatobiliary: No focal liver abnormality is seen. No gallstones, gallbladder wall thickening, or biliary dilatation. Pancreas: Previously seen fluid collection in the head of the pancreas which had measured 2.7 x 2.1 cm is increased in size to 3.4 x 4.2 cm. Also seen is a fluid collection anterior to the medial aspect of the left kidney measuring approximately 5.1 x 2.8 cm in the axial plane. Previously seen fluid collection anterior to the mid body of the pancreas on the most recent CT has markedly decreased in size. Spleen: Normal in size without focal abnormality. Adrenals/Urinary Tract: Cyst upper pole left kidney is noted. The kidneys are otherwise unremarkable. Ureters and urinary bladder appear normal. The adrenal glands are normal appearance. Stomach/Bowel: Peg tube is in place as on the most recent examination. Although decompressed, the walls of the distal stomach are thickened. There is no evidence of bowel obstruction. Wall thickening of the ascending and proximal transverse colon could be due to under distention or colitis. The colon is otherwise unremarkable. No pneumatosis, portal venous gas or free air. Vascular/Lymphatic: Aortic atherosclerosis. No enlarged abdominal or pelvic lymph nodes. Reproductive: Uterus and bilateral adnexa are unremarkable. Other: Small volume of hemoperitoneum seen in the pelvis on the 10/14/2018 stay demonstrates evolutionary change with some wall formation  posteriorly and decreased in attenuation. Musculoskeletal: No acute abnormality. IMPRESSION: Thickening of the walls of the distal stomach could be due to under distention but may also be secondary to gastritis. Thickening of the walls of the ascending and proximal transverse colon could be due to colitis or under distension. Increase in the size of a pseudocyst in the head of the pancreas. New pseudocyst in the left upper quadrant anterior to the left kidney is identified. Electronically Signed   By: Drusilla Kannerhomas  Dalessio M.D.   On: 11/11/2018 15:09   Dg Chest Port 1 View  Result Date: 11/11/2018 CLINICAL DATA:  Onset abdominal pain last night. EXAM: PORTABLE CHEST 1 VIEW COMPARISON:  Single-view of the chest 11/01/2018. PA and lateral chest 10/09/2014. FINDINGS: Small focus of atelectasis in the periphery of the light lung bases noted. Lungs otherwise clear. Heart size normal. No pneumothorax or pleural effusion. No acute or focal bony abnormality. IMPRESSION: Minimal left basilar atelectasis.  Lungs otherwise clear. Electronically Signed   By: Drusilla Kannerhomas  Dalessio M.D.   On: 11/11/2018 17:00    Pending Labs Unresulted Labs (From admission, onward)    Start     Ordered   11/18/18 0500  Creatinine, serum  (enoxaparin (LOVENOX)    CrCl >/= 30 ml/min)  Weekly,   R    Comments:  while on enoxaparin therapy    11/11/18 1600   11/12/18 0500  Comprehensive metabolic panel  Tomorrow morning,   R     11/11/18 1600   11/12/18 0500  CBC  Tomorrow morning,   R     11/11/18 1600   11/12/18  0500  Lipase, blood  Tomorrow morning,   R     11/11/18 1600          Vitals/Pain Today's Vitals   11/11/18 0842 11/11/18 0847 11/11/18 1122 11/11/18 1130  BP:  136/87 135/83 (!) 143/88  Pulse:  (!) 118 (!) 129 (!) 130  Resp:  20 19   Temp:  98 F (36.7 C) 99.1 F (37.3 C)   TempSrc:  Oral Oral   SpO2:  97% 97% 96%  Weight: 52 kg     Height: 5\' 1"  (1.549 m)     PainSc: 10-Worst pain ever       Isolation  Precautions No active isolations  Medications Medications  enoxaparin (LOVENOX) injection 40 mg (has no administration in time range)  0.9 %  sodium chloride infusion (has no administration in time range)  acetaminophen (TYLENOL) tablet 650 mg (has no administration in time range)    Or  acetaminophen (TYLENOL) suppository 650 mg (has no administration in time range)  morphine 2 MG/ML injection 1 mg (has no administration in time range)  ondansetron (ZOFRAN) injection 4 mg (has no administration in time range)  diphenhydrAMINE (BENADRYL) injection 25 mg (has no administration in time range)  pantoprazole (PROTONIX) injection 40 mg (has no administration in time range)  LORazepam (ATIVAN) injection 0.5 mg (has no administration in time range)  nicotine (NICODERM CQ - dosed in mg/24 hours) patch 21 mg (has no administration in time range)  oxyCODONE (ROXICODONE) 5 MG/5ML solution 5 mg (5 mg Per Tube Given 11/11/18 1020)  promethazine (PHENERGAN) tablet 12.5 mg (12.5 mg Oral Given 11/11/18 1020)  LORazepam (ATIVAN) injection 0.5 mg (0.5 mg Intravenous Given 11/11/18 1352)  iohexol (OMNIPAQUE) 300 MG/ML solution 15 mL (15 mLs Oral Contrast Given 11/11/18 1433)  iohexol (OMNIPAQUE) 300 MG/ML solution 75 mL (75 mLs Intravenous Contrast Given 11/11/18 1434)  piperacillin-tazobactam (ZOSYN) IVPB 3.375 g (0 g Intravenous Stopped 11/11/18 1710)  oxyCODONE (ROXICODONE) 5 MG/5ML solution 5 mg (5 mg Oral Given 11/11/18 1709)  famotidine (PEPCID) IVPB 20 mg premix (0 mg Intravenous Stopped 11/11/18 1710)  pantoprazole (PROTONIX) injection 40 mg (40 mg Intravenous Given 11/11/18 1604)    Mobility walks High fall risk   Focused Assessments    R Recommendations: See Admitting Provider Note  Report given to:   Additional Notes:

## 2018-11-11 NOTE — ED Notes (Signed)
Pt states she ran out of the liquid oxycodone on Friday which was rx on 11/07/18.  The quantity she was given only lasted <48 hours.

## 2018-11-12 ENCOUNTER — Encounter (HOSPITAL_COMMUNITY): Payer: Self-pay | Admitting: Gastroenterology

## 2018-11-12 DIAGNOSIS — K529 Noninfective gastroenteritis and colitis, unspecified: Secondary | ICD-10-CM

## 2018-11-12 DIAGNOSIS — K861 Other chronic pancreatitis: Secondary | ICD-10-CM

## 2018-11-12 DIAGNOSIS — K219 Gastro-esophageal reflux disease without esophagitis: Secondary | ICD-10-CM

## 2018-11-12 LAB — COMPREHENSIVE METABOLIC PANEL
ALT: 28 U/L (ref 0–44)
AST: 26 U/L (ref 15–41)
Albumin: 2.5 g/dL — ABNORMAL LOW (ref 3.5–5.0)
Alkaline Phosphatase: 99 U/L (ref 38–126)
Anion gap: 10 (ref 5–15)
BUN: 16 mg/dL (ref 6–20)
CO2: 25 mmol/L (ref 22–32)
Calcium: 8.4 mg/dL — ABNORMAL LOW (ref 8.9–10.3)
Chloride: 103 mmol/L (ref 98–111)
Creatinine, Ser: 0.35 mg/dL — ABNORMAL LOW (ref 0.44–1.00)
GFR calc Af Amer: >60 mL/min (ref >60)
GFR calc non Af Amer: >60 mL/min (ref >60)
Glucose, Bld: 94 mg/dL (ref 70–99)
Potassium: 3.9 mmol/L (ref 3.5–5.1)
Sodium: 138 mmol/L (ref 135–145)
Total Bilirubin: 0.1 mg/dL — ABNORMAL LOW (ref 0.3–1.2)
Total Protein: 6.1 g/dL — ABNORMAL LOW (ref 6.5–8.1)

## 2018-11-12 LAB — CBC
HCT: 26.5 % — ABNORMAL LOW (ref 36.0–46.0)
Hemoglobin: 7.9 g/dL — ABNORMAL LOW (ref 12.0–15.0)
MCH: 29.5 pg (ref 26.0–34.0)
MCHC: 29.8 g/dL — ABNORMAL LOW (ref 30.0–36.0)
MCV: 98.9 fL (ref 80.0–100.0)
Platelets: 437 10*3/uL — ABNORMAL HIGH (ref 150–400)
RBC: 2.68 MIL/uL — ABNORMAL LOW (ref 3.87–5.11)
RDW: 17.3 % — ABNORMAL HIGH (ref 11.5–15.5)
WBC: 7.7 10*3/uL (ref 4.0–10.5)
nRBC: 0 % (ref 0.0–0.2)

## 2018-11-12 LAB — T4, FREE: Free T4: 1.06 ng/dL (ref 0.82–1.77)

## 2018-11-12 LAB — LIPID PANEL
Cholesterol: 170 mg/dL (ref 0–200)
HDL: 23 mg/dL — ABNORMAL LOW (ref >40)
LDL Cholesterol: 110 mg/dL — ABNORMAL HIGH (ref 0–99)
Total CHOL/HDL Ratio: 7.4 RATIO
Triglycerides: 186 mg/dL — ABNORMAL HIGH (ref <150)
VLDL: 37 mg/dL (ref 0–40)

## 2018-11-12 LAB — LIPASE, BLOOD: Lipase: 106 U/L — ABNORMAL HIGH (ref 11–51)

## 2018-11-12 LAB — LACTATE DEHYDROGENASE: LDH: 122 U/L (ref 98–192)

## 2018-11-12 LAB — SEDIMENTATION RATE: Sed Rate: 35 mm/hr — ABNORMAL HIGH (ref 0–22)

## 2018-11-12 LAB — TSH: TSH: 5.972 u[IU]/mL — ABNORMAL HIGH (ref 0.350–4.500)

## 2018-11-12 MED ORDER — SODIUM CHLORIDE 0.9 % IV SOLN
INTRAVENOUS | Status: AC
  Administered 2018-11-12 – 2018-11-13 (×4): via INTRAVENOUS

## 2018-11-12 MED ORDER — BUTALBITAL-APAP-CAFFEINE 50-325-40 MG PO TABS
1.0000 | ORAL_TABLET | ORAL | Status: AC | PRN
  Administered 2018-11-12: 1 via ORAL
  Filled 2018-11-12: qty 1

## 2018-11-12 MED ORDER — KETOROLAC TROMETHAMINE 15 MG/ML IJ SOLN
15.0000 mg | Freq: Once | INTRAMUSCULAR | Status: AC
  Administered 2018-11-12: 15 mg via INTRAVENOUS
  Filled 2018-11-12: qty 1

## 2018-11-12 NOTE — Progress Notes (Signed)
Initial Nutrition Assessment  DOCUMENTATION CODES:   Severe malnutrition in context of chronic illness  INTERVENTION:  RD will continue to follow - recommend resume home tube feeding regimen (noted below) when MD deems appropriate   NUTRITION DIAGNOSIS:   Severe Malnutrition related to chronic illness(chronic pancreatitis) as evidenced by severe muscle depletion, severe fat depletion.   GOAL:   Patient will meet greater than or equal to 90% of their needs  MONITOR:   Diet advancement, TF tolerance, Labs, Weight trends  REASON FOR ASSESSMENT:   Malnutrition Screening Tool    ASSESSMENT: Patient is a 48 yo female with chronic alcoholic pancreatitis. Patient had PEG placed and was discharged 5/27 on tube feeds: Osmolite 1.5 to goal rate of 40cc/hr (960 ml per day) and prostat to 30 ml daily which provides 1540 kcals, 75 gm protein, 732 ml free water daily. Flushes free water 250 ml QID.  5/31-CT abdomen and pelvis findings -thickening of walls distal stomach  Patient reports compliance with tube feeding as ordered and her boyfriend has been assisting her with management. She is complaining of abdominal pain and had one episode of vomiting. NPO currently and has been since discharge. Significant weight gain 3.6 kg / 7% since discharge. Medications reviewed and include: Protonix, Lovenox, Nicotine.   Labs: Albumin 2.9 (L)  BMP Latest Ref Rng & Units 11/12/2018 11/11/2018 11/07/2018  Glucose 70 - 99 mg/dL 94 808(U) 110(R)  BUN 6 - 20 mg/dL 16 15(X) 7  Creatinine 0.44 - 1.00 mg/dL 4.58(P) 9.29(W) <4.46(K)  Sodium 135 - 145 mmol/L 138 137 139  Potassium 3.5 - 5.1 mmol/L 3.9 3.9 4.0  Chloride 98 - 111 mmol/L 103 98 102  CO2 22 - 32 mmol/L 25 25 27   Calcium 8.9 - 10.3 mg/dL 8.6(N) 8.1(R) 7.8(L)     NUTRITION - FOCUSED PHYSICAL EXAM:   Severe upper/lower body fat and muscle loss-multiple regions.   Diet Order:   Diet Order            Diet NPO time specified  Diet effective now               EDUCATION NEEDS:   No education needs have been identified at this time  Skin:  Skin Assessment: Reviewed RN Assessment  Last BM:  5/31  Height:   Ht Readings from Last 1 Encounters:  11/11/18 5\' 1"  (1.549 m)    Weight:   Wt Readings from Last 1 Encounters:  11/12/18 52.4 kg    Ideal Body Weight:  48 kg  BMI:  Body mass index is 21.83 kg/m.  Estimated Nutritional Needs:   Kcal:  7116-5790 (30-32 kcal/kg/bw)  Protein:  73-83 (1.4-1.6 gr/kg/bw)  Fluid:  >1500 ml daily   Royann Shivers MS,RD,CSG,LDN Office: [phone] Pager: [phone]

## 2018-11-12 NOTE — Consult Note (Signed)
Referring Provider: No ref. provider found Primary Care Physician:  Patient, No Pcp Per Primary Gastroenterologist:  Dr. Darrick PennaFields  Date of Admission: 11/11/18 Date of Consultation: 11/12/18  Reason for Consultation:  Pancreatitis, pancreatic pseudocyst  HPI:  Monique Nguyen is a 48 y.o. year old female with past medical history significant for chronic alcoholic pancreatitis, pancreatic pseudocysts, failure to thrive, GERD, and anxiety. Recent admission from 5/3-5/12 with acute on chronic pancreatitis and failure to thrive and 5/21-5/27 with acute on chronic pancreatitis and aspiration pneumonitis.   PEG tube placed on 5/11.   EGD on 5/5 with normal esophagus, normal stomach (minimally deformed antrum-nonspecific), normal duodenal bulb and second portion of duodenum.   She presented to the ED on 11/11/18 with abdominal pain after running out of liquid oxycodone on May 29th. Labs in the ED with WBC 12,600, hemoglobin 9.3, hematocrit 30.2, platelets 549,000. Lipase 340. BUN 22, Cr 0.33, albumin 2.9.   CT abdomen and pelvis 11/11/18: Pancreas: Fluid collection in the head of the pancreas which had measured 2.7 x 2.1 cm is increased in size to 3.4 x 4.2 cm. New fluid collection anterior to the medial aspect of the left kidney measuring approximately 5.1 x 2.8 cm in the axial plane. Previously seen fluid collection anterior to the mid body of the pancreas on the most recent CT has markedly decreased in size. Stomach/Bowel: Thickening of walls of distal stomach. Wall thickening of ascending and proximal transverse colon could be due to under distention or colitis.  Patient states she ran out of her oxycodone on Saturday and her abdominal pain became unbareable. States she was taking 5mL every 4-5 hours. She reports abdominal pain 8/10 today, but states it is improving since admission. She was resting comfortably when I entered the room. Admits to persistent nausea and one episode of vomiting on Friday.  Zofran helps with nausea. BM 2x/day and are loose. Last BM yesterday. She is not on creon due to cost. She has continued her tube feeds at home without any trouble. States she hasn't had anything by mouth since she was discharged on 5/27. Denies any alcohol use in over 1 month. Denies smoking cigarettes since last admission. [facility] not [facility]; states her boyfriend is supposed to be taking care of that.   Also has acid reflux daily. On Protonix BID, but with breakthrough symptoms.   Explains she is short of breath at rest that is worse with talking. CXR this admission with  Minimal left basilar atelectasis. Lungs otherwise clear. Denies cough.   Denies brbpr, melena, hematemesis, coffee ground emesis.    Past Medical History:  Diagnosis Date  . Alcohol abuse   . Anxiety   . Bacterial vaginosis   . Chronic abdominal pain   . Chronic pain   . Depression   . GERD (gastroesophageal reflux disease)   . IBS (irritable bowel syndrome)   . Pancreatitis    ETOH  . Panic attacks   . Psoriasis     Past Surgical History:  Procedure Laterality Date  . BLADDER SURGERY     X 3  . ESOPHAGOGASTRODUODENOSCOPY (EGD) WITH PROPOFOL N/A 10/16/2018   Procedure: ESOPHAGOGASTRODUODENOSCOPY (EGD) WITH PROPOFOL;  Surgeon: Corbin Adeourk, Robert M, MD;  Location: [facility];  Service: Endoscopy;  Laterality: N/A;  . ESOPHAGOGASTRODUODENOSCOPY (EGD) WITH PROPOFOL N/A 10/22/2018   Procedure: ESOPHAGOGASTRODUODENOSCOPY (EGD) WITH PROPOFOL;  Surgeon: Lucretia RoersBridges, Lindsay C, MD;  Location: [facility];  Service: General;  Laterality: N/A;  . EUS  N/A 10/11/2012   Dr. Christella Hartigan: 3.7 cm cystic lesion in body of pancreas with large amount of debris, s/p FNA with reddish, milky fluid, main pancreatic duct normal,   . EUS  12/2015   mild to moderate chronic pancreatitis, pancreatic duct with dilation and intraductal stones, measuring up to 6 mm in diameter, no abnormality in main bile duct, abnormal lymph nodes in  perigastric region, largest measuring 12 mm in diameter, s/p fine needle biopsy. Pathology with multiple fragments of lymphoid tissue with granulomatous inflammation, negative GMS and AFB stains,  . PEG PLACEMENT N/A 10/22/2018   Procedure: PERCUTANEOUS ENDOSCOPIC GASTROSTOMY (PEG) PLACEMENT with endoscopy;  Surgeon: Lucretia Roers, MD;  Location: [facility];  Service: General;  Laterality: N/A;    Prior to Admission medications   Medication Sig Start Date End Date Taking? Authorizing Provider  Amino Acids-Protein Hydrolys (FEEDING SUPPLEMENT, PRO-STAT SUGAR FREE 64,) LIQD Take 30 mLs by mouth 2 (two) times daily. 10/23/18  Yes Shah, Pratik D, DO  Calcium Carbonate Antacid (CALCIUM CARBONATE, DOSED IN MG ELEMENTAL CALCIUM,) 1250 MG/5ML SUSP Take 10 mLs (1,000 mg of elemental calcium total) by mouth 3 (three) times daily with meals for 30 days. 10/23/18 11/22/18 Yes Shah, Pratik D, DO  ferrous sulfate 300 (60 Fe) MG/5ML syrup Place 5 mLs (300 mg total) into feeding tube daily with breakfast for 30 days. 11/08/18 12/08/18 Yes Johnson, Clanford L, MD  folic acid (FOLVITE) 1 MG tablet Place 1 tablet (1 mg total) into feeding tube daily for 30 days. 11/08/18 12/08/18 Yes Johnson, Clanford L, MD  hydrOXYzine (ATARAX/VISTARIL) 25 MG tablet Place 1 tablet (25 mg total) into feeding tube 3 (three) times daily as needed for anxiety. 11/07/18  Yes Johnson, Clanford L, MD  imipramine (TOFRANIL) 10 MG tablet Place 1 tablet (10 mg total) into feeding tube at bedtime. 11/07/18  Yes Johnson, Clanford L, MD  Nutritional Supplements (FEEDING SUPPLEMENT, OSMOLITE 1.5 CAL,) LIQD Place 1,000 mLs into feeding tube continuous. 10/23/18  Yes Shah, Pratik D, DO  oxyCODONE (ROXICODONE) 5 MG/5ML solution Place 5 mLs (5 mg total) into feeding tube every 4 (four) hours as needed for up to 7 days for severe pain. 11/07/18 11/14/18 Yes Johnson, Clanford L, MD  pantoprazole sodium (PROTONIX) 40 mg/20 mL PACK Place 20 mLs (40 mg total) into  feeding tube 2 (two) times daily before a meal for 30 days. 11/07/18 12/07/18 Yes Johnson, Clanford L, MD  polyethylene glycol (MIRALAX / GLYCOLAX) 17 g packet Place 17 g into feeding tube daily as needed for up to 30 days for mild constipation. 11/07/18 12/07/18 Yes Johnson, Clanford L, MD  sertraline (ZOLOFT) 50 MG tablet Place 1 tablet (50 mg total) into feeding tube daily. 11/07/18  Yes Johnson, Clanford L, MD  thiamine 100 MG tablet Place 1 tablet (100 mg total) into feeding tube daily for 30 days. 11/08/18 12/08/18 Yes Cleora Fleet, MD    Current Facility-Administered Medications  Medication Dose Route Frequency Provider Last Rate Last Dose  . 0.9 %  sodium chloride infusion   Intravenous Continuous Pearson Grippe, MD 150 mL/hr at 11/12/18 0123 150 mL/hr at 11/12/18 0123  . acetaminophen (TYLENOL) tablet 650 mg  650 mg Oral Q6H PRN Pearson Grippe, MD       Or  . acetaminophen (TYLENOL) suppository 650 mg  650 mg Rectal Q6H PRN Pearson Grippe, MD      . diphenhydrAMINE (BENADRYL) injection 25 mg  25 mg Intravenous Q6H PRN Pearson Grippe, MD      .  enoxaparin (LOVENOX) injection 40 mg  40 mg Subcutaneous Q24H Pearson Grippe, MD   40 mg at 11/11/18 2049  . LORazepam (ATIVAN) injection 0.5 mg  0.5 mg Intravenous Q6H PRN Pearson Grippe, MD   0.5 mg at 11/11/18 2334  . morphine 2 MG/ML injection 1 mg  1 mg Intravenous Q4H PRN Pearson Grippe, MD   1 mg at 11/12/18 0542  . nicotine (NICODERM CQ - dosed in mg/24 hours) patch 21 mg  21 mg Transdermal Daily PRN Pearson Grippe, MD      . ondansetron [facility]) injection 4 mg  4 mg Intravenous Q6H PRN Pearson Grippe, MD   4 mg at 11/12/18 0547  . pantoprazole (PROTONIX) injection 40 mg  40 mg Intravenous Q12H Pearson Grippe, MD   40 mg at 11/12/18 0547    Allergies as of 11/11/2018 - Review Complete 11/11/2018  Allergen Reaction Noted  . Doxycycline Other (See Comments) 07/07/2012  . Sulfa antibiotics Nausea And Vomiting and Other (See Comments) 11/10/2010    Family History  Problem  Relation Age of Onset  . Depression Mother        living  . Pancreatic cancer Father        PATIENT STATES PROSTATE CANCER. UNCLEAR IF ACTUAL PANCREATIC CANCER  . Colon cancer Neg Hx   . Colon polyps Neg Hx     Social History   Socioeconomic History  . Marital status: Widowed    Spouse name: Not on file  . Number of children: Not on file  . Years of education: Not on file  . Highest education level: Not on file  Occupational History  . Not on file  Social Needs  . Financial resource strain: Not on file  . Food insecurity:    Worry: Not on file    Inability: Not on file  . Transportation needs:    Medical: Not on file    Non-medical: Not on file  Tobacco Use  . Smoking status: Current Some Day Smoker    Packs/day: 1.00    Years: 8.00    Pack years: 8.00    Types: Cigarettes  . Smokeless tobacco: Never Used  Substance and Sexual Activity  . Alcohol use: Yes    Alcohol/week: 3.0 standard drinks    Types: 3 Cans of beer per week    Comment: last drink 06/05/18, hx of ETOH abuse  . Drug use: Not Currently    Types: Marijuana    Comment: last time used 1` month ago. DENIED DRUG USE  on 10/15/2018  . Sexual activity: Yes    Birth control/protection: None  Lifestyle  . Physical activity:    Days per week: Not on file    Minutes per session: Not on file  . Stress: Not on file  Relationships  . Social connections:    Talks on phone: Not on file    Gets together: Not on file    Attends religious service: Not on file    Active member of club or organization: Not on file    Attends meetings of clubs or organizations: Not on file    Relationship status: Not on file  . Intimate partner violence:    Fear of current or ex partner: Not on file    Emotionally abused: Not on file    Physically abused: Not on file    Forced sexual activity: Not on file  Other Topics Concern  . Not on file  Social History Narrative  . Not on file  Review of Systems: Gen: Denies fever,  chills CV: Denies chest pain, heart palpitations, syncope, edema  Resp: See HPI GI: Denies dysphagia or odynophagia.  Psych: Admits to depression and anxiety Heme: See HPI  Physical Exam: Vital signs in last 24 hours: Temp:  [98 F (36.7 C)-99.1 F (37.3 C)] 98.2 F (36.8 C) (06/01 0557) Pulse Rate:  [105-130] 105 (06/01 0557) Resp:  [17-20] 17 (06/01 0557) BP: (124-143)/(77-88) 124/77 (06/01 0557) SpO2:  [96 %-100 %] 100 % (06/01 0557) Weight:  [52 kg-52.4 kg] 52.4 kg (06/01 0500) Last BM Date: 11/11/18 General:   Alert, pleasant, and resting comfortably.  Head:  Normocephalic and atraumatic. Eyes:  Sclera clear, no icterus.   Conjunctiva pink. Lungs:  Clear throughout to auscultation.   No wheezes, crackles, or rhonchi. No acute distress. Heart: S1, S2 present. Regular rhythm, tachycardia present, no murmurs appreciated.  Abdomen: Moderately tender in epigastric area. Soft and nondistended. No masses, hepatosplenomegaly or hernias noted. Normal bowel sounds, without guarding, and without rebound. Feeding tube present without redness or drainage.  Rectal:  Deferred until time of colonoscopy.   Msk:  Symmetrical without gross deformities. Pulses:  Normal pulses noted. Extremities:  Without clubbing or edema. Neurologic:  Alert and  oriented x4;  grossly normal neurologically. Skin:  Intact without significant lesions or rashes. Psych:  Alert and cooperative. Somewhat anxious.  Intake/Output from previous day: 05/31 0701 - 06/01 0700 In: 1467.2 [I.V.:1367.2; IV Piggyback:100] Out: -  Intake/Output this shift: No intake/output data recorded.  Lab Results: Recent Labs    11/11/18 1000 11/12/18 0532  WBC 12.6* 7.7  HGB 9.3* 7.9*  HCT 30.2* 26.5*  PLT 549* 437*   BMET Recent Labs    11/11/18 1000 11/12/18 0532  NA 137 138  K 3.9 3.9  CL 98 103  CO2 25 25  GLUCOSE 150* 94  BUN 22* 16  CREATININE 0.33* 0.35*  CALCIUM 8.8* 8.4*   LFT Recent Labs     11/11/18 1000 11/12/18 0532  PROT 7.1 6.1*  ALBUMIN 2.9* 2.5*  AST 34 26  ALT 36 28  ALKPHOS 114 99  BILITOT 0.5 0.1*    Studies/Results: Ct Abdomen Pelvis W Contrast  Result Date: 11/11/2018 CLINICAL DATA:  Abdominal pain since last night. EXAM: CT ABDOMEN AND PELVIS WITH CONTRAST TECHNIQUE: Multidetector CT imaging of the abdomen and pelvis was performed using the standard protocol following bolus administration of intravenous contrast. CONTRAST:  75 mL OMNIPAQUE IOHEXOL 300 MG/ML  SOLN COMPARISON:  CT abdomen and pelvis 10/31/2017 and 10/14/2018. FINDINGS: Lower chest: Dependent atelectasis right lung base noted. No pleural or pericardial effusion. Hepatobiliary: No focal liver abnormality is seen. No gallstones, gallbladder wall thickening, or biliary dilatation. Pancreas: Previously seen fluid collection in the head of the pancreas which had measured 2.7 x 2.1 cm is increased in size to 3.4 x 4.2 cm. Also seen is a fluid collection anterior to the medial aspect of the left kidney measuring approximately 5.1 x 2.8 cm in the axial plane. Previously seen fluid collection anterior to the mid body of the pancreas on the most recent CT has markedly decreased in size. Spleen: Normal in size without focal abnormality. Adrenals/Urinary Tract: Cyst upper pole left kidney is noted. The kidneys are otherwise unremarkable. Ureters and urinary bladder appear normal. The adrenal glands are normal appearance. Stomach/Bowel: Peg tube is in place as on the most recent examination. Although decompressed, the walls of the distal stomach are thickened. There is no evidence of bowel  obstruction. Wall thickening of the ascending and proximal transverse colon could be due to under distention or colitis. The colon is otherwise unremarkable. No pneumatosis, portal venous gas or free air. Vascular/Lymphatic: Aortic atherosclerosis. No enlarged abdominal or pelvic lymph nodes. Reproductive: Uterus and bilateral adnexa are  unremarkable. Other: Small volume of hemoperitoneum seen in the pelvis on the 10/14/2018 stay demonstrates evolutionary change with some wall formation posteriorly and decreased in attenuation. Musculoskeletal: No acute abnormality. IMPRESSION: Thickening of the walls of the distal stomach could be due to under distention but may also be secondary to gastritis. Thickening of the walls of the ascending and proximal transverse colon could be due to colitis or under distension. Increase in the size of a pseudocyst in the head of the pancreas. New pseudocyst in the left upper quadrant anterior to the left kidney is identified. Electronically Signed   By: Drusilla Kanner M.D.   On: 11/11/2018 15:09   Dg Chest Port 1 View  Result Date: 11/11/2018 CLINICAL DATA:  Onset abdominal pain last night. EXAM: PORTABLE CHEST 1 VIEW COMPARISON:  Single-view of the chest 11/01/2018. PA and lateral chest 10/09/2014. FINDINGS: Small focus of atelectasis in the periphery of the light lung bases noted. Lungs otherwise clear. Heart size normal. No pneumothorax or pleural effusion. No acute or focal bony abnormality. IMPRESSION: Minimal left basilar atelectasis.  Lungs otherwise clear. Electronically Signed   By: Drusilla Kanner M.D.   On: 11/11/2018 17:00    Impression: 48 y.o female with history of chronic pancreatitis due to ETOH and pancreatic pseudocyst admitted with worsening abdominal pain. Lipase elevated on admission at 340. CT abdomen and pelvis this admission significant for pseudocyst in the head of the pancreas increasing in size and a new pseudocyst in the left upper quadrant anterior to the left kidney is identified. Lipase is trending down, now at 106, and abdominal pain is improving since admission. Currently NPO. Consider restarting tube feeds tomorrow if pain continues to improve and lipase continues to trend down. Patient was supposed to follow up with [facility] for EUS/ERCP; however this has not happened yet.  Multiple CTs sent to [facility] for follow-up. No further treatment options locally. Will continue supportive measures. Discussed with Dr. Darrick Penna. Patient likely needs to be transferred to [facility].  GERD: Patient with breakthrough symptoms, but overall improved on Protonix. CT with possible gastritis this admission. No alarm symptoms. We will continue Protonix BID.  Hemoglobin 9.3 on admission, today 7.9. This is likely dilutional. Possible component of dehydration on admission with her not taking anything by mouth and only doing her regular tube feedings. She has been receiving IV fluids since admission and her hemoglobin now is consistent with her baseline between 7-8. We will continue to follow.     Leukocytosis on admission has resolved. Leukocytosis was likely reactive. Of note, patient did get one dose of Zosyn in the ED.  CT abdomen and pelvis on 5/31 did show some wall thickening of the ascending and proximal transverse colon could be due to under distention or colitis. No other symptoms to suggest colitis. I suspect loose stool is more likely related to pancreatic insufficieny. Hold off on antibiotics at this time. We will continue to follow.  Plan: Consider restarting tube feeds tomorrow as long as pain continues to improve and lipase is trending down.  Follow H/H Continue morphine for pain Continue Zofran for nausea     LOS: 1 day    11/12/2018, 7:44 AM   Ermalinda Memos, PA-C [place]  [facility]

## 2018-11-12 NOTE — Progress Notes (Signed)
Patient to have GI procedure at [facility] on 11/13/2018, [facility] to pick-up patient at 0800 on 11/13/2018 per Dr. Darrick Penna, Charge nurse number for GI unit is [phone].

## 2018-11-12 NOTE — Progress Notes (Signed)
PROGRESS NOTE    Monique Nguyen  ZOX:[id number]  DOB: 05/05/1971  DOA: 11/11/2018 PCP: Patient, No Pcp Per   Brief Admission Hx: 48 y.o. female, w hx of chronic pancreatitis, pancreatic pseudocyst, EGD 10/16/18=> negative for gastric outlet obstruction, 10/21/18 PEG placement,  h/o alcoholism in remission, severe protein calorie malnutrition, anxiety/ depression, w admisssion 5/3-5/12/20 for pancreatitis, 5/21-5/27/20 for pancreatitis/ aspiration pneumonitis,  c/o abdominal pain generalized.  She was readmitted 5/31 with recurrent intractable pain and CT shows worsening pancreatic pseudocyst.   MDM/Assessment & Plan:   1. Acute on chronic pancreatitis - recurrent with worsening pancreatic pseudocysts - Appreciate GI assistance.  [facility] to have treatment with Dr. Germain OsgoodGrim.  Pt will leave at 8am by [facility] and return after the procedure.  NPO for now holding tube feeding. Continue IV fluids.  2. Sinus tachycardia - secondary to pancreatitis, treating supportively.  3. Leukocytosis - WBC down to normal.  4. 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  5. GERD - continue protonix IV. 6. Anemia chronic disease - follow CBC.   7. H/o alcohol abuse - monitoring for withdrawal.  8. AnxDep - holding zoloft, continue alprazolam.   DVT prophylaxis: lovenox  Code Status: Full  Family Communication:  Disposition Plan: inpatient management   Consultants:    Procedures:    Antimicrobials:     Subjective: Pt complains of abdominal pain, complains of chest tightness, denies SOB.   Objective: Vitals:   11/12/18 0500 11/12/18 0557 11/12/18 1338 11/12/18 1525  BP:  124/77 122/73 119/75  Pulse:  (!) 105 (!) 113 (!) 114  Resp:  17 19 18   Temp:  98.2 F (36.8 C) 98.4 F (36.9 C) 97.8 F (36.6 C)  TempSrc:  Oral Oral Oral  SpO2:  100% 100% 100%  Weight: 52.4 kg     Height:        Intake/Output Summary (Last 24 hours) at 11/12/2018 1715 Last data  filed at 11/12/2018 1530 Gross per 24 hour  Intake 2765.87 ml  Output --  Net 2765.87 ml   Filed Weights   11/11/18 0842 11/12/18 0500  Weight: 52 kg 52.4 kg     REVIEW OF SYSTEMS  As per history otherwise all reviewed and reported negative  Exam:  General exam: emaciate chronically ill appearing female. NAD.  Respiratory system: Clear. No increased work of breathing. Cardiovascular system: S1 & S2 heard. No JVD, murmurs, gallops, clicks or pedal edema. Gastrointestinal system: Abdomen is nondistended,  Diffuse epigastric tenderness. Normal bowel sounds heard. Central nervous system: Alert and oriented. No focal neurological deficits. Extremities: no CCE.  Data Reviewed: Basic Metabolic Panel: Recent Labs  Lab 11/06/18 0547 11/07/18 0611 11/11/18 1000 11/12/18 0532  NA 139 139 137 138  K 3.8 4.0 3.9 3.9  CL 101 102 98 103  CO2 32 27 25 25   GLUCOSE 125* 167* 150* 94  BUN 9 7 22* 16  CREATININE <0.30* <0.30* 0.33* 0.35*  CALCIUM 7.5* 7.8* 8.8* 8.4*  MG 1.9 2.0  --   --    Liver Function Tests: Recent Labs  Lab 11/06/18 0547 11/07/18 0611 11/11/18 1000 11/12/18 0532  AST 20 30 34 26  ALT 27 28 36 28  ALKPHOS 108 110 114 99  BILITOT 0.3 0.3 0.5 0.1*  PROT 4.8* 5.1* 7.1 6.1*  ALBUMIN 1.7* 2.0* 2.9* 2.5*   Recent Labs  Lab 11/06/18 0547 11/11/18 1000 11/12/18 0532  LIPASE 137* 340*  106*   No results for input(s): AMMONIA in the last 168 hours. CBC: Recent Labs  Lab 11/06/18 0547 11/07/18 0611 11/11/18 1000 11/12/18 0532  WBC 8.7 8.6 12.6* 7.7  NEUTROABS  --   --  10.5*  --   HGB 7.9* 8.2* 9.3* 7.9*  HCT 25.4* 26.8* 30.2* 26.5*  MCV 98.4 98.2 97.4 98.9  PLT 636* 636* 549* 437*   Cardiac Enzymes: Recent Labs  Lab 11/11/18 1620  TROPONINI <0.03   CBG (last 3)  No results for input(s): GLUCAP in the last 72 hours. Recent Results (from the past 240 hour(s))  SARS Coronavirus 2 (CEPHEID - Performed in [facility] hospital lab), Hosp Order      Status: None   Collection Time: 11/11/18  5:25 PM  Result Value Ref Range Status   SARS Coronavirus 2 NEGATIVE NEGATIVE Final    Comment: (NOTE) If result is NEGATIVE SARS-CoV-2 target nucleic acids are NOT DETECTED. The SARS-CoV-2 RNA is generally detectable in upper and lower  respiratory specimens during the acute phase of infection. The lowest  concentration of SARS-CoV-2 viral copies this assay can detect is 250  copies / mL. A negative result does not preclude SARS-CoV-2 infection  and should not be used as the sole basis for treatment or other  patient management decisions.  A negative result may occur with  improper specimen collection / handling, submission of specimen other  than nasopharyngeal swab, presence of viral mutation(s) within the  areas targeted by this assay, and inadequate number of viral copies  (<250 copies / mL). A negative result must be combined with clinical  observations, patient history, and epidemiological information. If result is POSITIVE SARS-CoV-2 target nucleic acids are DETECTED. The SARS-CoV-2 RNA is generally detectable in upper and lower  respiratory specimens dur ing the acute phase of infection.  Positive  results are indicative of active infection with SARS-CoV-2.  Clinical  correlation with patient history and other diagnostic information is  necessary to determine patient infection status.  Positive results do  not rule out bacterial infection or co-infection with other viruses. If result is PRESUMPTIVE POSTIVE SARS-CoV-2 nucleic acids MAY BE PRESENT.   A presumptive positive result was obtained on the submitted specimen  and confirmed on repeat testing.  While 2019 novel coronavirus  (SARS-CoV-2) nucleic acids may be present in the submitted sample  additional confirmatory testing may be necessary for epidemiological  and / or clinical management purposes  to differentiate between  SARS-CoV-2 and other Sarbecovirus currently known to  infect humans.  If clinically indicated additional testing with an alternate test  methodology [phone]) is advised. The SARS-CoV-2 RNA is generally  detectable in upper and lower respiratory sp ecimens during the acute  phase of infection. The expected result is Negative. Fact Sheet for Patients:  BoilerBrush.com.cy Fact Sheet for Healthcare Providers: https://pope.com/ This test is not yet approved or cleared by the [place] FDA and has been authorized for detection and/or diagnosis of SARS-CoV-2 by FDA under an Emergency Use Authorization (EUA).  This EUA will remain in effect (meaning this test can be used) for the duration of the COVID-19 declaration under Section 564(b)(1) of the Act, 21 U.S.C. section 360bbb-3(b)(1), unless the authorization is terminated or revoked sooner. Performed at [facility], [address], [place], [place] [address]      Studies: Ct Abdomen Pelvis W Contrast  Result Date: 11/11/2018 CLINICAL DATA:  Abdominal pain since last night. EXAM: CT ABDOMEN AND PELVIS WITH CONTRAST TECHNIQUE:  Multidetector CT imaging of the abdomen and pelvis was performed using the standard protocol following bolus administration of intravenous contrast. CONTRAST:  75 mL OMNIPAQUE IOHEXOL 300 MG/ML  SOLN COMPARISON:  CT abdomen and pelvis 10/31/2017 and 10/14/2018. FINDINGS: Lower chest: Dependent atelectasis right lung base noted. No pleural or pericardial effusion. Hepatobiliary: No focal liver abnormality is seen. No gallstones, gallbladder wall thickening, or biliary dilatation. Pancreas: Previously seen fluid collection in the head of the pancreas which had measured 2.7 x 2.1 cm is increased in size to 3.4 x 4.2 cm. Also seen is a fluid collection anterior to the medial aspect of the left kidney measuring approximately 5.1 x 2.8 cm in the axial plane. Previously seen fluid collection anterior to the mid body of the pancreas on  the most recent CT has markedly decreased in size. Spleen: Normal in size without focal abnormality. Adrenals/Urinary Tract: Cyst upper pole left kidney is noted. The kidneys are otherwise unremarkable. Ureters and urinary bladder appear normal. The adrenal glands are normal appearance. Stomach/Bowel: Peg tube is in place as on the most recent examination. Although decompressed, the walls of the distal stomach are thickened. There is no evidence of bowel obstruction. Wall thickening of the ascending and proximal transverse colon could be due to under distention or colitis. The colon is otherwise unremarkable. No pneumatosis, portal venous gas or free air. Vascular/Lymphatic: Aortic atherosclerosis. No enlarged abdominal or pelvic lymph nodes. Reproductive: Uterus and bilateral adnexa are unremarkable. Other: Small volume of hemoperitoneum seen in the pelvis on the 10/14/2018 stay demonstrates evolutionary change with some wall formation posteriorly and decreased in attenuation. Musculoskeletal: No acute abnormality. IMPRESSION: Thickening of the walls of the distal stomach could be due to under distention but may also be secondary to gastritis. Thickening of the walls of the ascending and proximal transverse colon could be due to colitis or under distension. Increase in the size of a pseudocyst in the head of the pancreas. New pseudocyst in the left upper quadrant anterior to the left kidney is identified. Electronically Signed   By: Drusilla Kanner M.D.   On: 11/11/2018 15:09   Dg Chest Port 1 View  Result Date: 11/11/2018 CLINICAL DATA:  Onset abdominal pain last night. EXAM: PORTABLE CHEST 1 VIEW COMPARISON:  Single-view of the chest 11/01/2018. PA and lateral chest 10/09/2014. FINDINGS: Small focus of atelectasis in the periphery of the light lung bases noted. Lungs otherwise clear. Heart size normal. No pneumothorax or pleural effusion. No acute or focal bony abnormality. IMPRESSION: Minimal left basilar  atelectasis.  Lungs otherwise clear. Electronically Signed   By: Drusilla Kanner M.D.   On: 11/11/2018 17:00     Scheduled Meds:  enoxaparin (LOVENOX) injection  40 mg Subcutaneous Q24H   pantoprazole (PROTONIX) IV  40 mg Intravenous Q12H   Continuous Infusions:  Principal Problem:   Pancreatitis Active Problems:   Nausea & vomiting   GERD (gastroesophageal reflux disease)   Pancreatic pseudocyst   Leukocytosis   Anemia   Acute pancreatitis   Colitis   Time spent:   Standley Dakins, MD [facility] 11/12/2018, 5:15 PM    LOS: 1 day  How to contact the [facility] Attending or Consulting provider 7A - 7P or covering provider during after hours 7P -7A, for this patient?  1. Check the care team in [facility] and look for a) attending/consulting TR[facility] provider listed and b) the [facility] team listed 2. Log into [email or web address] and use [place]'s universal password to access. If you do  not have the password, please contact the hospital operator. 3. Locate the [facility] provider you are looking for under [facility] and page to a number that you can be directly reached. 4. If you still have difficulty reaching the provider, please page the [facility] (Director on Call) for the Hospitalists listed on amion for assistance.

## 2018-11-13 DIAGNOSIS — K859 Acute pancreatitis without necrosis or infection, unspecified: Principal | ICD-10-CM

## 2018-11-13 LAB — CBC WITH DIFFERENTIAL/PLATELET
Abs Immature Granulocytes: 0.04 10*3/uL (ref 0.00–0.07)
Basophils Absolute: 0 10*3/uL (ref 0.0–0.1)
Basophils Relative: 0 %
Eosinophils Absolute: 0.2 10*3/uL (ref 0.0–0.5)
Eosinophils Relative: 3 %
HCT: 24.3 % — ABNORMAL LOW (ref 36.0–46.0)
Hemoglobin: 7.1 g/dL — ABNORMAL LOW (ref 12.0–15.0)
Immature Granulocytes: 1 %
Lymphocytes Relative: 9 %
Lymphs Abs: 0.6 10*3/uL — ABNORMAL LOW (ref 0.7–4.0)
MCH: 29.7 pg (ref 26.0–34.0)
MCHC: 29.2 g/dL — ABNORMAL LOW (ref 30.0–36.0)
MCV: 101.7 fL — ABNORMAL HIGH (ref 80.0–100.0)
Monocytes Absolute: 0.4 10*3/uL (ref 0.1–1.0)
Monocytes Relative: 6 %
Neutro Abs: 5.6 10*3/uL (ref 1.7–7.7)
Neutrophils Relative %: 81 %
Platelets: 371 10*3/uL (ref 150–400)
RBC: 2.39 MIL/uL — ABNORMAL LOW (ref 3.87–5.11)
RDW: 16.5 % — ABNORMAL HIGH (ref 11.5–15.5)
WBC: 6.9 10*3/uL (ref 4.0–10.5)
nRBC: 0 % (ref 0.0–0.2)

## 2018-11-13 LAB — BASIC METABOLIC PANEL
Anion gap: 12 (ref 5–15)
BUN: 12 mg/dL (ref 6–20)
CO2: 19 mmol/L — ABNORMAL LOW (ref 22–32)
Calcium: 7.8 mg/dL — ABNORMAL LOW (ref 8.9–10.3)
Chloride: 107 mmol/L (ref 98–111)
Creatinine, Ser: 0.36 mg/dL — ABNORMAL LOW (ref 0.44–1.00)
GFR calc Af Amer: >60 mL/min (ref >60)
GFR calc non Af Amer: >60 mL/min (ref >60)
Glucose, Bld: 63 mg/dL — ABNORMAL LOW (ref 70–99)
Potassium: 3.4 mmol/L — ABNORMAL LOW (ref 3.5–5.1)
Sodium: 138 mmol/L (ref 135–145)

## 2018-11-13 LAB — LIPASE, BLOOD: Lipase: 93 U/L — ABNORMAL HIGH (ref 11–51)

## 2018-11-13 MED ORDER — ALUM & MAG HYDROXIDE-SIMETH 200-200-20 MG/5ML PO SUSP
30.0000 mL | Freq: Once | ORAL | Status: AC
  Administered 2018-11-13: 30 mL via ORAL
  Filled 2018-11-13: qty 30

## 2018-11-13 MED ORDER — MORPHINE SULFATE (PF) 2 MG/ML IV SOLN
2.0000 mg | INTRAVENOUS | Status: DC | PRN
  Administered 2018-11-13 – 2018-11-15 (×8): 2 mg via INTRAVENOUS
  Filled 2018-11-13 (×9): qty 1

## 2018-11-13 MED ORDER — LIDOCAINE VISCOUS HCL 2 % MT SOLN
15.0000 mL | Freq: Once | OROMUCOSAL | Status: AC
  Administered 2018-11-13: 15 mL via ORAL
  Filled 2018-11-13: qty 15

## 2018-11-13 MED ORDER — PANTOPRAZOLE SODIUM 40 MG PO PACK
40.0000 mg | PACK | Freq: Two times a day (BID) | ORAL | Status: DC
  Administered 2018-11-13 – 2018-11-14 (×2): 40 mg
  Filled 2018-11-13 (×3): qty 20

## 2018-11-13 NOTE — Progress Notes (Signed)
Subjective: Today, Mrs. Toki reports she has continued to have pain between and 8-9/10. The morphine helps for about an hour and a half. Reports one episode of nausea overnight associated with feeling hot and sweat, but no vomiting. Got up and wet her hair to cool off. Nausea continues on and off throughout the day, zofran helps. Last BM in the ED. She asks for ice cream today, but says she doesn't want it to make her feel worse. Mentions that she was supposed to go to [facility], but their equipment is down.   Objective: Vital signs in last 24 hours: Temp:  [97.8 F (36.6 C)-98.4 F (36.9 C)] 97.8 F (36.6 C) (06/02 0506) Pulse Rate:  [111-118] 111 (06/02 0506) Resp:  [18-20] 20 (06/02 0506) BP: (114-128)/(73-79) 114/79 (06/02 0506) SpO2:  [94 %-100 %] 94 % (06/02 0820) Weight:  [42.5 kg] 42.5 kg (06/02 0506) Last BM Date: 11/11/18 General:   Alert and oriented, pleasant, resting comfortably. Head:  Normocephalic and atraumatic. Eyes:  No icterus, sclera clear.  Heart:  S1, S2 present, no murmurs noted.  Abdomen:  Bowel sounds present. Soft abdomen and non-distended, but with tenderness to palpation diffusely with light palpation. Not improved since yesterday. No HSM or hernias noted. PEG tube in place without redness or drainage. Neurologic:  Alert and  oriented x4;  grossly normal neurologically. Psych:  Alert and cooperative. Anxious mood.   Lab Results: Recent Labs    11/11/18 1000 11/12/18 0532  WBC 12.6* 7.7  HGB 9.3* 7.9*  HCT 30.2* 26.5*  PLT 549* 437*   BMET Recent Labs    11/11/18 1000 11/12/18 0532  NA 137 138  K 3.9 3.9  CL 98 103  CO2 25 25  GLUCOSE 150* 94  BUN 22* 16  CREATININE 0.33* 0.35*  CALCIUM 8.8* 8.4*   LFT Recent Labs    11/11/18 1000 11/12/18 0532  PROT 7.1 6.1*  ALBUMIN 2.9* 2.5*  AST 34 26  ALT 36 28  ALKPHOS 114 99  BILITOT 0.5 0.1*    Studies/Results: Ct Abdomen Pelvis W Contrast  Result Date: 11/11/2018 CLINICAL DATA:   Abdominal pain since last night. EXAM: CT ABDOMEN AND PELVIS WITH CONTRAST TECHNIQUE: Multidetector CT imaging of the abdomen and pelvis was performed using the standard protocol following bolus administration of intravenous contrast. CONTRAST:  75 mL OMNIPAQUE IOHEXOL 300 MG/ML  SOLN COMPARISON:  CT abdomen and pelvis 10/31/2017 and 10/14/2018. FINDINGS: Lower chest: Dependent atelectasis right lung base noted. No pleural or pericardial effusion. Hepatobiliary: No focal liver abnormality is seen. No gallstones, gallbladder wall thickening, or biliary dilatation. Pancreas: Previously seen fluid collection in the head of the pancreas which had measured 2.7 x 2.1 cm is increased in size to 3.4 x 4.2 cm. Also seen is a fluid collection anterior to the medial aspect of the left kidney measuring approximately 5.1 x 2.8 cm in the axial plane. Previously seen fluid collection anterior to the mid body of the pancreas on the most recent CT has markedly decreased in size. Spleen: Normal in size without focal abnormality. Adrenals/Urinary Tract: Cyst upper pole left kidney is noted. The kidneys are otherwise unremarkable. Ureters and urinary bladder appear normal. The adrenal glands are normal appearance. Stomach/Bowel: Peg tube is in place as on the most recent examination. Although decompressed, the walls of the distal stomach are thickened. There is no evidence of bowel obstruction. Wall thickening of the ascending and proximal transverse colon could be due to under distention  or colitis. The colon is otherwise unremarkable. No pneumatosis, portal venous gas or free air. Vascular/Lymphatic: Aortic atherosclerosis. No enlarged abdominal or pelvic lymph nodes. Reproductive: Uterus and bilateral adnexa are unremarkable. Other: Small volume of hemoperitoneum seen in the pelvis on the 10/14/2018 stay demonstrates evolutionary change with some wall formation posteriorly and decreased in attenuation. Musculoskeletal: No acute  abnormality. IMPRESSION: Thickening of the walls of the distal stomach could be due to under distention but may also be secondary to gastritis. Thickening of the walls of the ascending and proximal transverse colon could be due to colitis or under distension. Increase in the size of a pseudocyst in the head of the pancreas. New pseudocyst in the left upper quadrant anterior to the left kidney is identified. Electronically Signed   By: Drusilla Kanner M.D.   On: 11/11/2018 15:09   Dg Chest Port 1 View  Result Date: 11/11/2018 CLINICAL DATA:  Onset abdominal pain last night. EXAM: PORTABLE CHEST 1 VIEW COMPARISON:  Single-view of the chest 11/01/2018. PA and lateral chest 10/09/2014. FINDINGS: Small focus of atelectasis in the periphery of the light lung bases noted. Lungs otherwise clear. Heart size normal. No pneumothorax or pleural effusion. No acute or focal bony abnormality. IMPRESSION: Minimal left basilar atelectasis.  Lungs otherwise clear. Electronically Signed   By: Drusilla Kanner M.D.   On: 11/11/2018 17:00    Assessment:  48 y.o female with history of chronic pancreatitis due to ETOH and pancreatic pseudocyst admitted with acute on chronic pancreatitis and enlarging pseudocysts.  Lipase elevated on admission at 340. CT abdomen and pelvis this admission significant for pseudocyst in the head of the pancreas increasing in size and a new pseudocyst in the left upper quadrant anterior to the left kidney is identified. Lipase was trending down as of yesterday. Abdominal pain unchanged since yesterday. Currently NPO. Was planning for a day transfer to [facility] today for ERCP; however, they cannot provide transport for a day trip and will only transport for hospital admission. Will pursue hospital admission at [facility] for further management of her pancreatitis as she is not able to be adequately managed locally. For now, we will continue to hold tube feeds today as her pain is still not adequately controlled  and nausea has continued. Spoke with Dr. Laural Benes who agrees with increasing her pain medicine at this time. We will reassess tomorrow and consider restarting tube feeds if clinically improving. Recheck lipase today.  Anemia: Hemoglobin dropped to 7.9 on 6/1 from 9.3 on admission. Likely dilutional effect this admission. Consistent with hemoglobin at her last discharge. Known hemoperitoneum of unknown etiology present on CT on 5/3, 5/21, and 5/31. No signs of overt bleeding. Will recheck CBC today. Likely this will need to be followed up on as an outpatient.   Leukocytosis on admission. Likely reactive.WBC within normal limits on 6/1. Of note, patient did get one dose of Zosyn in the ED. CT abdomen and pelvis on 5/31 did show some wall thickening of the ascending and proximal transverse colon could be due to under distention or colitis. Patient continues to have no symptoms suggesting colitis. Loose stool prior to admission likely related to pancreatic insufficieny. Hold off on antibiotics at this time. Recheck CBC today.  Plan: Recheck CBC, BMP, and Lipase today. Increase Morphine to 1-2mg  PRN every 4 hours for pain.   Continue PPI BID Continue Zofran for nausea Continue NPO status. Will follow for clinical improvement and start tube feeds when abdominal pain is improved.  LOS: 2 days    11/13/2018, 9:22 AM   Ermalinda MemosKristen Adeleigh Barletta, [facility]

## 2018-11-13 NOTE — Progress Notes (Signed)
PROGRESS NOTE  Monique Nguyen  ZOX:[id number]  DOB: [birth year]  DOA: 11/11/2018 PCP: Patient, No Pcp Per  Brief Admission Hx: 48 y.o. female, w hx of chronic pancreatitis, pancreatic pseudocyst, EGD 10/16/18=> negative for gastric outlet obstruction, 10/21/18 PEG placement,  h/o alcoholism in remission, severe protein calorie malnutrition, anxiety/ depression, w admisssion 5/3-5/12/20 for pancreatitis, 5/21-5/27/20 for pancreatitis/ aspiration pneumonitis,  c/o abdominal pain generalized.  She was readmitted 5/31 with recurrent intractable pain and CT shows worsening pancreatic pseudocyst.  Arrangements have been made for patient to go to [facility] to have procedure to treat pseudocyst on Thursday with Dr. Germain Osgood.  GI arranged [facility] transport. Pt will return after procedure.    MDM/Assessment & Plan:   1. Acute on chronic pancreatitis - recurrent with worsening pancreatic pseudocysts - Appreciate GI assistance.  Pt is going to [facility] on Thursday to have treatment/procedure with Dr. Germain Osgood and will return to [facility] after procedure.  Pt to be transported by [facility].  NPO for now holding tube feeding. Continue IV fluids.  Pt says pain is uncontrolled.  Increased morphine to 2 mg every 4 hours. Lipase trending down.  2. Sinus tachycardia - secondary to pancreatitis, treating supportively. DC telemetry. 3. Leukocytosis - WBC down to normal.  4. 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  5. GERD - continue protonix. 6. Anemia chronic disease - follow CBC as Hg is trending down with hydration, transfuse for Hg <7.    7. H/o alcohol abuse - monitoring for withdrawal.  8. AnxDep - holding zoloft, continue alprazolam.   DVT prophylaxis: lovenox  Code Status: Full  Family Communication:  Disposition Plan: inpatient management  Consultants:    Procedures:    Antimicrobials:     Subjective: Pt complains of abdominal pain that is not controlled with current treatment regimen,  chest tightness has resolved.  Objective: Vitals:   11/12/18 1525 11/12/18 2135 11/13/18 0506 11/13/18 0820  BP: 119/75 128/77 114/79   Pulse: (!) 114 (!) 118 (!) 111   Resp: Temp: 97.8 F (36.6 C) 98.4 F (36.9 C) 97.8 F (36.6 C)   TempSrc: Oral Oral Oral   SpO2: 100% 100% 99% 94%  Weight:   42.5 kg   Height:        Intake/Output Summary (Last 24 hours) at 11/13/2018 1226 Last data filed at 11/13/2018 8119 Gross per 24 hour  Intake 2677.99 ml  Output --  Net 2677.99 ml   Filed Weights   11/11/18 0842 11/12/18 0500 11/13/18 0506  Weight: 52 kg 52.4 kg 42.5 kg   REVIEW OF SYSTEMS  As per history otherwise all reviewed and reported negative  Exam:  General exam: emaciated chronically ill appearing female. NAD.  Respiratory system: Clear. No increased work of breathing. Cardiovascular system: S1 & S2 heard. No JVD, murmurs, gallops, clicks or pedal edema. Gastrointestinal system: Abdomen is nondistended,  Diffuse epigastric tenderness. Normal bowel sounds heard. Central nervous system: Alert and oriented. No focal neurological deficits. Extremities: no CCE.  Data Reviewed: Basic Metabolic Panel: Recent Labs  Lab 11/07/18 0611 11/11/18 1000 11/12/18 0532 11/13/18 1050  NA 139 137 138 138  K 4.0 3.9 3.9 3.4*  CL 102 98 103 107  CO2 19*  GLUCOSE 167* 150* 94 63*  BUN 7 22* 16 12  CREATININE <0.30* 0.33* 0.35* 0.36*  CALCIUM 7.8* 8.8* 8.4* 7.8*  MG 2.0  --   --   --  Liver Function Tests: Recent Labs  Lab 11/07/18 0611 11/11/18 1000 11/12/18 0532  AST 30 34 26  ALT 28 36 28  ALKPHOS 110 114 99  BILITOT 0.3 0.5 0.1*  PROT 5.1* 7.1 6.1*  ALBUMIN 2.0* 2.9* 2.5*   Recent Labs  Lab 11/11/18 1000 11/12/18 0532 11/13/18 1050  LIPASE 340* 106* 93*   No results for input(s): AMMONIA in the last 168 hours. CBC: Recent Labs  Lab 11/07/18 0611 11/11/18 1000 11/12/18 0532 11/13/18 1050  WBC 8.6 12.6* 7.7 6.9  NEUTROABS  --  10.5*   --  5.6  HGB 8.2* 9.3* 7.9* 7.1*  HCT 26.8* 30.2* 26.5* 24.3*  MCV 98.2 97.4 98.9 101.7*  PLT 636* 549* 437* 371   Cardiac Enzymes: Recent Labs  Lab 11/11/18 1620  TROPONINI <0.03   CBG (last 3)  No results for input(s): GLUCAP in the last 72 hours. Recent Results (from the past 240 hour(s))  SARS Coronavirus 2 (CEPHEID - Performed in [facility] hospital lab), Hosp Order     Status: None   Collection Time: 11/11/18  5:25 PM  Result Value Ref Range Status   SARS Coronavirus 2 NEGATIVE NEGATIVE Final    Comment: (NOTE) If result is NEGATIVE SARS-CoV-2 target nucleic acids are NOT DETECTED. The SARS-CoV-2 RNA is generally detectable in upper and lower  respiratory specimens during the acute phase of infection. The lowest  concentration of SARS-CoV-2 viral copies this assay can detect is 250  copies / mL. A negative result does not preclude SARS-CoV-2 infection  and should not be used as the sole basis for treatment or other  patient management decisions.  A negative result may occur with  improper specimen collection / handling, submission of specimen other  than nasopharyngeal swab, presence of viral mutation(s) within the  areas targeted by this assay, and inadequate number of viral copies  (<250 copies / mL). A negative result must be combined with clinical  observations, patient history, and epidemiological information. If result is POSITIVE SARS-CoV-2 target nucleic acids are DETECTED. The SARS-CoV-2 RNA is generally detectable in upper and lower  respiratory specimens dur ing the acute phase of infection.  Positive  results are indicative of active infection with SARS-CoV-2.  Clinical  correlation with patient history and other diagnostic information is  necessary to determine patient infection status.  Positive results do  not rule out bacterial infection or co-infection with other viruses. If result is PRESUMPTIVE POSTIVE SARS-CoV-2 nucleic acids MAY BE PRESENT.   A  presumptive positive result was obtained on the submitted specimen  and confirmed on repeat testing.  While 2019 novel coronavirus  (SARS-CoV-2) nucleic acids may be present in the submitted sample  additional confirmatory testing may be necessary for epidemiological  and / or clinical management purposes  to differentiate between  SARS-CoV-2 and other Sarbecovirus currently known to infect humans.  If clinically indicated additional testing with an alternate test  methodology [phone]([id number]) is advised. The SARS-CoV-2 RNA is generally  detectable in upper and lower respiratory sp ecimens during the acute  phase of infection. The expected result is Negative. Fact Sheet for Patients:  BoilerBrush.com.cyhttps://www.fda.gov/media/136312/download Fact Sheet for Healthcare Providers: https://pope.com/https://www.fda.gov/media/136313/download This test is not yet approved or cleared by the [place] FDA and has been authorized for detection and/or diagnosis of SARS-CoV-2 by FDA under an Emergency Use Authorization (EUA).  This EUA will remain in effect (meaning this test can be used) for the duration of the COVID-19 declaration under Section 564(b)(1)  of the Act, 21 U.S.C. section 360bbb-3(b)(1), unless the authorization is terminated or revoked sooner. Performed at [facility], [address], [place], [place] [address]      Studies: Ct Abdomen Pelvis W Contrast  Result Date: 11/11/2018 CLINICAL DATA:  Abdominal pain since last night. EXAM: CT ABDOMEN AND PELVIS WITH CONTRAST TECHNIQUE: Multidetector CT imaging of the abdomen and pelvis was performed using the standard protocol following bolus administration of intravenous contrast. CONTRAST:  75 mL OMNIPAQUE IOHEXOL 300 MG/ML  SOLN COMPARISON:  CT abdomen and pelvis 10/31/2017 and 10/14/2018. FINDINGS: Lower chest: Dependent atelectasis right lung base noted. No pleural or pericardial effusion. Hepatobiliary: No focal liver abnormality is seen. No gallstones, gallbladder wall  thickening, or biliary dilatation. Pancreas: Previously seen fluid collection in the head of the pancreas which had measured 2.7 x 2.1 cm is increased in size to 3.4 x 4.2 cm. Also seen is a fluid collection anterior to the medial aspect of the left kidney measuring approximately 5.1 x 2.8 cm in the axial plane. Previously seen fluid collection anterior to the mid body of the pancreas on the most recent CT has markedly decreased in size. Spleen: Normal in size without focal abnormality. Adrenals/Urinary Tract: Cyst upper pole left kidney is noted. The kidneys are otherwise unremarkable. Ureters and urinary bladder appear normal. The adrenal glands are normal appearance. Stomach/Bowel: Peg tube is in place as on the most recent examination. Although decompressed, the walls of the distal stomach are thickened. There is no evidence of bowel obstruction. Wall thickening of the ascending and proximal transverse colon could be due to under distention or colitis. The colon is otherwise unremarkable. No pneumatosis, portal venous gas or free air. Vascular/Lymphatic: Aortic atherosclerosis. No enlarged abdominal or pelvic lymph nodes. Reproductive: Uterus and bilateral adnexa are unremarkable. Other: Small volume of hemoperitoneum seen in the pelvis on the 10/14/2018 stay demonstrates evolutionary change with some wall formation posteriorly and decreased in attenuation. Musculoskeletal: No acute abnormality. IMPRESSION: Thickening of the walls of the distal stomach could be due to under distention but may also be secondary to gastritis. Thickening of the walls of the ascending and proximal transverse colon could be due to colitis or under distension. Increase in the size of a pseudocyst in the head of the pancreas. New pseudocyst in the left upper quadrant anterior to the left kidney is identified. Electronically Signed   By: Drusilla Kanner M.D.   On: 11/11/2018 15:09   Dg Chest Port 1 View  Result Date:  11/11/2018 CLINICAL DATA:  Onset abdominal pain last night. EXAM: PORTABLE CHEST 1 VIEW COMPARISON:  Single-view of the chest 11/01/2018. PA and lateral chest 10/09/2014. FINDINGS: Small focus of atelectasis in the periphery of the light lung bases noted. Lungs otherwise clear. Heart size normal. No pneumothorax or pleural effusion. No acute or focal bony abnormality. IMPRESSION: Minimal left basilar atelectasis.  Lungs otherwise clear. Electronically Signed   By: Drusilla Kanner M.D.   On: 11/11/2018 17:00   Scheduled Meds:  enoxaparin (LOVENOX) injection  40 mg Subcutaneous Q24H   pantoprazole sodium  40 mg Per Tube BID AC   Continuous Infusions:  sodium chloride 150 mL/hr at 11/13/18 4982    Principal Problem:   Pancreatitis Active Problems:   Nausea & vomiting   GERD (gastroesophageal reflux disease)   Pancreatic pseudocyst   Leukocytosis   Anemia   Acute pancreatitis   Colitis  Time spent:   Standley Dakins, MD [facility] 11/13/2018, 12:26 PM  LOS: 2 days  How to contact the [facility] Attending or Consulting provider 7A - 7P or covering provider during after hours 7P -7A, for this patient?  1. Check the care team in [facility] and look for a) attending/consulting [facility] provider listed and b) the [facility] team listed 2. Log into [email or web address] and use [place]'s universal password to access. If you do not have the password, please contact the hospital operator. 3. Locate the [facility] provider you are looking for under [facility] and page to a number that you can be directly reached. 4. If you still have difficulty reaching the provider, please page the [facility] (Director on Call) for the Hospitalists listed on amion for assistance.

## 2018-11-13 NOTE — Telephone Encounter (Signed)
Received fax yesterday from [facility]. They have been unable to contact pt to schedule an appt. However, she is currently admitted to [facility]. AB was informed and she advised pt will be going there 11/15/18 for EUS.

## 2018-11-14 DIAGNOSIS — K529 Noninfective gastroenteritis and colitis, unspecified: Secondary | ICD-10-CM

## 2018-11-14 LAB — CBC
HCT: 27.3 % — ABNORMAL LOW (ref 36.0–46.0)
Hemoglobin: 8.2 g/dL — ABNORMAL LOW (ref 12.0–15.0)
MCH: 29.9 pg (ref 26.0–34.0)
MCHC: 30 g/dL (ref 30.0–36.0)
MCV: 99.6 fL (ref 80.0–100.0)
Platelets: 491 10*3/uL — ABNORMAL HIGH (ref 150–400)
RBC: 2.74 MIL/uL — ABNORMAL LOW (ref 3.87–5.11)
RDW: 16.5 % — ABNORMAL HIGH (ref 11.5–15.5)
WBC: 7 10*3/uL (ref 4.0–10.5)
nRBC: 0 % (ref 0.0–0.2)

## 2018-11-14 LAB — COMPREHENSIVE METABOLIC PANEL
ALT: 28 U/L (ref 0–44)
AST: 26 U/L (ref 15–41)
Albumin: 2.3 g/dL — ABNORMAL LOW (ref 3.5–5.0)
Alkaline Phosphatase: 89 U/L (ref 38–126)
Anion gap: 9 (ref 5–15)
BUN: 7 mg/dL (ref 6–20)
CO2: 24 mmol/L (ref 22–32)
Calcium: 8 mg/dL — ABNORMAL LOW (ref 8.9–10.3)
Chloride: 105 mmol/L (ref 98–111)
Creatinine, Ser: 0.33 mg/dL — ABNORMAL LOW (ref 0.44–1.00)
GFR calc Af Amer: >60 mL/min (ref >60)
GFR calc non Af Amer: >60 mL/min (ref >60)
Glucose, Bld: 126 mg/dL — ABNORMAL HIGH (ref 70–99)
Potassium: 3.3 mmol/L — ABNORMAL LOW (ref 3.5–5.1)
Sodium: 138 mmol/L (ref 135–145)
Total Bilirubin: 0.2 mg/dL — ABNORMAL LOW (ref 0.3–1.2)
Total Protein: 5.5 g/dL — ABNORMAL LOW (ref 6.5–8.1)

## 2018-11-14 LAB — LIPASE, BLOOD: Lipase: 101 U/L — ABNORMAL HIGH (ref 11–51)

## 2018-11-14 MED ORDER — MORPHINE SULFATE (PF) 2 MG/ML IV SOLN
1.0000 mg | Freq: Once | INTRAVENOUS | Status: AC
  Administered 2018-11-14: 09:00:00 1 mg via INTRAVENOUS

## 2018-11-14 MED ORDER — POTASSIUM CHLORIDE 20 MEQ/15ML (10%) PO SOLN
40.0000 meq | Freq: Once | ORAL | Status: AC
  Administered 2018-11-14: 40 meq
  Filled 2018-11-14: qty 30

## 2018-11-14 NOTE — Progress Notes (Signed)
PROGRESS NOTE  Monique Nguyen WUJ:[id number] DOB: Dec 24, 1970 DOA: 11/11/2018 PCP: Patient, No Pcp Per  Brief History:  48 year old female with a history of alcohol induced chronic pancreatitis, failure to thrive, pancreatic pseudocyst, anxiety/depression, and chronic abdominal pain presenting with nausea, vomiting, and worsening abdominal pain for 2 to 3 days duration.  The patient was recently discharged from the hospital after stay from 11/01/2018 through 11/07/2018.  She was treated for acute on chronic pancreatitis and aspiration pneumonitis.  During that hospitalization, the patient also had a brief episode of paroxysmal atrial fibrillation.  She was discharged home to continue enteral feedings (Osmolite) at 40 cc/h.  Unfortunately, the patient experienced worsening abdominal pain with nausea and vomiting.  As result, the patient represented for further evaluation.  11/11/2018 CT abdomen of the pelvis showed a distended stomach with wall thickening secondary to underdistention versus gastritis.  There was also thickened walls of the anterior and proximal transverse colon.  There was increased size of the pseudocyst at the head of the pancreas.  Once again, GI was consulted to assist with management.  The patient was recently also discharged from the hospital after a long stay from 10/14/2018 through 10/23/2018. During that hospital stay, the patient was treated for acute on chronic pancreatitis as well as failure to thrive. EGD was performed on 10/16/2018 which was unremarkable and did not show gastric outlet obstruction. She initially had an NJ tube, but when this was dislodged, gastrostomy tube was ultimately placed. She was sent home with Osmolite enteral feedings.She states that she has drank fluids by mouth, but has not put any thing of substance or solid by mouth. She has some nausea when her gastrostomy tube is flushed, but has emesis occasionally when she takes any fluids by mouth.  She has not drank alcohol in nearly 2 months. She has not smoked in over 1 month.  She denies any headache, sore throat, coughing, hemoptysis. She has some intermittent shortness of breath. She denies any new medications, but states that she has been taking 2 ibuprofen on a daily basis.  Assessment/Plan: Acute on chronic pancreatitis -Referral has been sent to [facility], Dr. [name] for EUS/ERCP--scheduled for 11/15/18 at 1120 AM -creon has been d/c'd as pt unable to afford. Restart tube feeds. Miralax.  -Continue IV fluids -11/01/2018 CT abdomen and pelvis--small bilateral pleural effusions. Multiple upper abdominal fluid collections, most consistent with pseudocysts. (see report)  - lipase downtrending803>>>[id number]>>>137>>>101  Chronic abdominal pain -Multifactorial including constipation, urinary retention, acute on chronic pancreatitis and pseudocysts -Continue morphine IV  Failure to thrive -Continue enteral feeding after ERCP/EUS -continue ProStat -pt continues to turn off enteral feedings herself without notifying nursing despite numerous discussions not to do so -she will be poorly compliant with enteral feedings at home  Resolving Hemoperitoneum: -seen on imaging5/3 and 5/21, -Hb remains stable. -Continue to monitor. -Unclear etiology. -CTA abd if Hgb drops significantly -Continue to monitor  Paroxysmal Atrial Fibrillation:seen on EKG last 5/22-23 PM. TSH is wnl. repeat EKG 5/23 with NSR. Continue tele. -5/23 Echo--EF 55-60, normal diastolic fxn, no WMA -Chadsvasc is 1. No indication for anticoagulation at this pointgiven short duration and acute medical condition -Currently in sinus rhythm.  No further episodes  Hypokalemia Hypomagnesemia: -am BMP and mag  AnemiaofchronicDisease -iron saturation 12% -ferritin 366 -B12 329 -folate 14.2 -nulecit x 1       Disposition Plan:   D/C 6/4 Family Communication:   Family at  bedside  Consultants:  GI  Code Status:  FULL   DVT Prophylaxis:  SCDs   Procedures: As Listed in Progress Note Above  Antibiotics: None     Subjective: Patient continues to complain of abdominal pain which is about the same as usual.  She is able to tolerate a full liquid diet without worsening abdominal pain.  She has not had any vomiting, diarrhea, chest pain, shortness of breath, fevers, chills.  Objective: Vitals:   11/13/18 1326 11/13/18 2105 11/14/18 0454 11/14/18 1452  BP: 113/74 124/80 114/84 103/70  Pulse: (!) 110 (!) 105 (!) 116 (!) 115  Resp: 16 20 16 16   Temp: 98.5 F (36.9 C) 98.4 F (36.9 C) (!) 97.5 F (36.4 C) 98.6 F (37 C)  TempSrc: Oral Oral Oral Oral  SpO2: 100% 99% 100% 100%  Weight:   43 kg   Height:        Intake/Output Summary (Last 24 hours) at 11/14/2018 1742 Last data filed at 11/14/2018 0459 Gross per 24 hour  Intake 120 ml  Output 1200 ml  Net -1080 ml   Weight change: 0.454 kg Exam:   General:  Pt is alert, follows commands appropriately, not in acute distress  HEENT: No icterus, No thrush, No neck mass, [place]/AT  Cardiovascular: RRR, S1/S2, no rubs, no gallops  Respiratory: CTA bilaterally, no wheezing, no crackles, no rhonchi  Abdomen: Soft/+BS, generalized tender, non distended, no guarding  Extremities: No edema, No lymphangitis, No petechiae, No rashes, no synovitis   Data Reviewed: I have personally reviewed following labs and imaging studies Basic Metabolic Panel: Recent Labs  Lab 11/11/18 1000 11/12/18 0532 11/13/18 1050 11/14/18 0448  NA 137 138 138 138  K 3.9 3.9 3.4* 3.3*  CL 98 103 107 105  CO2 25 25 19* 24  GLUCOSE 150* 94 63* 126*  BUN 22* 16 12 7   CREATININE 0.33* 0.35* 0.36* 0.33*  CALCIUM 8.8* 8.4* 7.8* 8.0*   Liver Function Tests: Recent Labs  Lab 11/11/18 1000 11/12/18 0532 11/14/18 0448  AST 34 26 26  ALT 36 28 28  ALKPHOS 114 99 89  BILITOT 0.5 0.1* 0.2*  PROT 7.1 6.1* 5.5*  ALBUMIN  2.9* 2.5* 2.3*   Recent Labs  Lab 11/11/18 1000 11/12/18 0532 11/13/18 1050 11/14/18 0448  LIPASE 340* 106* 93* 101*   No results for input(s): AMMONIA in the last 168 hours. Coagulation Profile: No results for input(s): INR, PROTIME in the last 168 hours. CBC: Recent Labs  Lab 11/11/18 1000 11/12/18 0532 11/13/18 1050 11/14/18 0448  WBC 12.6* 7.7 6.9 7.0  NEUTROABS 10.5*  --  5.6  --   HGB 9.3* 7.9* 7.1* 8.2*  HCT 30.2* 26.5* 24.3* 27.3*  MCV 97.4 98.9 101.7* 99.6  PLT 549* 437* 371 491*   Cardiac Enzymes: Recent Labs  Lab 11/11/18 1620  TROPONINI <0.03   BNP: Invalid input(s): POCBNP CBG: No results for input(s): GLUCAP in the last 168 hours. HbA1C: No results for input(s): HGBA1C in the last 72 hours. Urine analysis:    Component Value Date/Time   COLORURINE YELLOW 11/11/2018 0937   APPEARANCEUR HAZY (A) 11/11/2018 0937   LABSPEC 1.014 11/11/2018 0937   PHURINE 8.0 11/11/2018 0937   GLUCOSEU NEGATIVE 11/11/2018 0937   HGBUR NEGATIVE 11/11/2018 0937   BILIRUBINUR NEGATIVE 11/11/2018 0937   KETONESUR NEGATIVE 11/11/2018 0937   PROTEINUR NEGATIVE 11/11/2018 0937   UROBILINOGEN 0.2 03/06/2015 1558   NITRITE NEGATIVE 11/11/2018 0937   LEUKOCYTESUR NEGATIVE 11/11/2018 [id number]  Sepsis Labs: @LABRCNTIP (procalcitonin:4,lacticidven:4) ) Recent Results (from the past 240 hour(s))  SARS Coronavirus 2 (CEPHEID - Performed in [facility] hospital lab), Hosp Order     Status: None   Collection Time: 11/11/18  5:25 PM  Result Value Ref Range Status   SARS Coronavirus 2 NEGATIVE NEGATIVE Final    Comment: (NOTE) If result is NEGATIVE SARS-CoV-2 target nucleic acids are NOT DETECTED. The SARS-CoV-2 RNA is generally detectable in upper and lower  respiratory specimens during the acute phase of infection. The lowest  concentration of SARS-CoV-2 viral copies this assay can detect is 250  copies / mL. A negative result does not preclude SARS-CoV-2 infection  and  should not be used as the sole basis for treatment or other  patient management decisions.  A negative result may occur with  improper specimen collection / handling, submission of specimen other  than nasopharyngeal swab, presence of viral mutation(s) within the  areas targeted by this assay, and inadequate number of viral copies  (<250 copies / mL). A negative result must be combined with clinical  observations, patient history, and epidemiological information. If result is POSITIVE SARS-CoV-2 target nucleic acids are DETECTED. The SARS-CoV-2 RNA is generally detectable in upper and lower  respiratory specimens dur ing the acute phase of infection.  Positive  results are indicative of active infection with SARS-CoV-2.  Clinical  correlation with patient history and other diagnostic information is  necessary to determine patient infection status.  Positive results do  not rule out bacterial infection or co-infection with other viruses. If result is PRESUMPTIVE POSTIVE SARS-CoV-2 nucleic acids MAY BE PRESENT.   A presumptive positive result was obtained on the submitted specimen  and confirmed on repeat testing.  While 2019 novel coronavirus  (SARS-CoV-2) nucleic acids may be present in the submitted sample  additional confirmatory testing may be necessary for epidemiological  and / or clinical management purposes  to differentiate between  SARS-CoV-2 and other Sarbecovirus currently known to infect humans.  If clinically indicated additional testing with an alternate test  methodology [phone]) is advised. The SARS-CoV-2 RNA is generally  detectable in upper and lower respiratory sp ecimens during the acute  phase of infection. The expected result is Negative. Fact Sheet for Patients:  BoilerBrush.com.cy Fact Sheet for Healthcare Providers: https://pope.com/ This test is not yet approved or cleared by the [place] FDA and has been  authorized for detection and/or diagnosis of SARS-CoV-2 by FDA under an Emergency Use Authorization (EUA).  This EUA will remain in effect (meaning this test can be used) for the duration of the COVID-19 declaration under Section 564(b)(1) of the Act, 21 U.S.C. section 360bbb-3(b)(1), unless the authorization is terminated or revoked sooner. Performed at [facility], [address] [place], [place] [address]      Scheduled Meds:  pantoprazole sodium  40 mg Per Tube BID AC   Continuous Infusions:  Procedures/Studies: Ct Abdomen Pelvis W Contrast  Result Date: 11/11/2018 CLINICAL DATA:  Abdominal pain since last night. EXAM: CT ABDOMEN AND PELVIS WITH CONTRAST TECHNIQUE: Multidetector CT imaging of the abdomen and pelvis was performed using the standard protocol following bolus administration of intravenous contrast. CONTRAST:  75 mL OMNIPAQUE IOHEXOL 300 MG/ML  SOLN COMPARISON:  CT abdomen and pelvis 10/31/2017 and 10/14/2018. FINDINGS: Lower chest: Dependent atelectasis right lung base noted. No pleural or pericardial effusion. Hepatobiliary: No focal liver abnormality is seen. No gallstones, gallbladder wall thickening, or biliary dilatation. Pancreas: Previously seen fluid collection in the head  of the pancreas which had measured 2.7 x 2.1 cm is increased in size to 3.4 x 4.2 cm. Also seen is a fluid collection anterior to the medial aspect of the left kidney measuring approximately 5.1 x 2.8 cm in the axial plane. Previously seen fluid collection anterior to the mid body of the pancreas on the most recent CT has markedly decreased in size. Spleen: Normal in size without focal abnormality. Adrenals/Urinary Tract: Cyst upper pole left kidney is noted. The kidneys are otherwise unremarkable. Ureters and urinary bladder appear normal. The adrenal glands are normal appearance. Stomach/Bowel: Peg tube is in place as on the most recent examination. Although decompressed, the walls of the distal  stomach are thickened. There is no evidence of bowel obstruction. Wall thickening of the ascending and proximal transverse colon could be due to under distention or colitis. The colon is otherwise unremarkable. No pneumatosis, portal venous gas or free air. Vascular/Lymphatic: Aortic atherosclerosis. No enlarged abdominal or pelvic lymph nodes. Reproductive: Uterus and bilateral adnexa are unremarkable. Other: Small volume of hemoperitoneum seen in the pelvis on the 10/14/2018 stay demonstrates evolutionary change with some wall formation posteriorly and decreased in attenuation. Musculoskeletal: No acute abnormality. IMPRESSION: Thickening of the walls of the distal stomach could be due to under distention but may also be secondary to gastritis. Thickening of the walls of the ascending and proximal transverse colon could be due to colitis or under distension. Increase in the size of a pseudocyst in the head of the pancreas. New pseudocyst in the left upper quadrant anterior to the left kidney is identified. Electronically Signed   By: Drusilla Kanner M.D.   On: 11/11/2018 15:09   Ct Abdomen Pelvis W Contrast  Result Date: 11/01/2018 CLINICAL DATA:  Generalized abdominal pain EXAM: CT ABDOMEN AND PELVIS WITH CONTRAST TECHNIQUE: Multidetector CT imaging of the abdomen and pelvis was performed using the standard protocol following bolus administration of intravenous contrast. CONTRAST:  OMNIPAQUE IOHEXOL 300 MG/ML  SOLN COMPARISON:  CT abdomen pelvis 10/14/2018 FINDINGS: LOWER CHEST: Small pleural effusions and bibasilar atelectasis. HEPATOBILIARY: The hepatic contours and density are normal. There is no intra- or extrahepatic biliary dilatation. The gallbladder is normal. PANCREAS: There are multiple peripancreatic cystic lesions. The most superior, near the gastro esophageal junction, now measures 1.7 cm, previously 2.5 cm. Collection along the anterior pancreatic body measures 3.3 x 3.2 cm, previously  2.4 x 1.8 cm. Collection of the pancreatic head measures 2.7 x 2.0 cm, previously 4.0 x 2.9 cm. Chronic atrophy of the pancreatic tail and distal body. SPLEEN: Normal. ADRENALS/URINARY TRACT: --Adrenal glands: Normal. --Right kidney/ureter: No hydronephrosis, nephroureterolithiasis, perinephric stranding or solid renal mass. --Left kidney/ureter: No hydronephrosis, nephroureterolithiasis, perinephric stranding or solid renal mass. --Urinary bladder: Markedly dilated. STOMACH/BOWEL: --Stomach/Duodenum: Gastrostomy tube is present within the stomach. --Small bowel: No dilatation or inflammation. --Colon: The cecum is markedly stool distended. --Appendix: Normal. VASCULAR/LYMPHATIC: Normal course and caliber of the major abdominal vessels. No abdominal or pelvic lymphadenopathy. REPRODUCTIVE: Normal uterus and ovaries. Small amount of free fluid in the pelvis. MUSCULOSKELETAL. No bony spinal canal stenosis or focal osseous abnormality. OTHER: None. IMPRESSION: 1. Multiple upper abdominal fluid collections, including collections in the pancreatic head and body and at the gastroesophageal junction, with mixed changes in size; the pancreatic body collection showing the greatest growth and the pancreatic head collection showing the greatest decrease. These collections are most consistent with pancreatic pseudocysts. No specific findings of infection, though this would be difficult to entirely exclude. 2.  Distended and stool-filled cecum. 3. Markedly distended urinary bladder without hydronephrosis. 4. Decreased density of fluid in the posterior cul-de-sac, likely resolving hemoperitoneum. Electronically Signed   By: Deatra Robinson M.D.   On: 11/01/2018 22:05   Dg Chest Port 1 View  Result Date: 11/11/2018 CLINICAL DATA:  Onset abdominal pain last night. EXAM: PORTABLE CHEST 1 VIEW COMPARISON:  Single-view of the chest 11/01/2018. PA and lateral chest 10/09/2014. FINDINGS: Small focus of atelectasis in the periphery of  the light lung bases noted. Lungs otherwise clear. Heart size normal. No pneumothorax or pleural effusion. No acute or focal bony abnormality. IMPRESSION: Minimal left basilar atelectasis.  Lungs otherwise clear. Electronically Signed   By: Drusilla Kanner M.D.   On: 11/11/2018 17:00   Dg Chest Portable 1 View  Result Date: 11/01/2018 CLINICAL DATA:  Fever EXAM: PORTABLE CHEST 1 VIEW COMPARISON:  Chest x-ray dated 10/09/2014 FINDINGS: Cardiac silhouette is enlarged. There is blunting of the right costophrenic angle with a right basilar airspace opacity. There is no pneumothorax. No large pleural effusion. There may be generalized volume overload. There is nonspecific gaseous distention of the partially visualized stomach. IMPRESSION: 1. Blunting of the right costophrenic angle favored to be secondary to a combination of a trace right-sided effusion and atelectasis. An infiltrate or aspiration is not excluded. 2. Mild cardiomegaly. Electronically Signed   By: Katherine Mantle M.D.   On: 11/01/2018 18:23   Dg Vangie Bicker G Tube Plc W/fl W/rad  Result Date: 10/17/2018 CLINICAL DATA:  Feeding tube placement, chronic pancreatitis, weight loss, failure to thrive EXAM: NASO G TUBE PLACEMENT WITH FL AND WITH RAD CONTRAST:  15 mL ISOVUE-300 IOPAMIDOL (ISOVUE-300) INJECTION 61% FLUOROSCOPY TIME:  Fluoroscopy Time:  3 minutes 12 seconds Radiation Exposure Index (if provided by the fluoroscopic device): 33.9 mGy Number of Acquired Spot Images: Single cine fluoroscopic series COMPARISON:  CT abdomen and pelvis 10/14/2018 FINDINGS: Pharynx was anesthetized with Cetacaine spray. Utilizing lubricant, a feeding tube was passed through the RIGHT nasal passages in the hypopharynx. With patient swallowing and neck flexed, feeding tube was advanced into the thoracic esophagus and down into the stomach. Catheter was manipulated and placed across the pylorus into the duodenum. Catheter was advanced to the level of ligament of Treitz.  Contrast injection confirms presence of the catheter tip at the ligament of Treitz. IMPRESSION: Placement of nasogastric feeding tube to the ligament of Treitz. Electronically Signed   By: Ulyses Southward M.D.   On: 10/17/2018 18:07    Catarina Hartshorn, DO  [facility] Pager [phone]  If 7PM-7AM, please contact night-coverage [email or web address] Password [email or web address] 11/14/2018, 5:42 PM   LOS: 3 days

## 2018-11-14 NOTE — Discharge Summary (Signed)
Physician Discharge Summary  JOHONNA BINETTE ZOX:[id number] DOB: 08-31-1970 DOA: 11/11/2018  PCP: Patient, No Pcp Per  Admit date: 11/11/2018 Discharge date: 11/15/2018  Admitted From: Home Disposition:  Home/[facility]  Recommendations for Outpatient Follow-up:  1. Follow up with PCP in 1-2 weeks 2. Please obtain CMP/CBC in one week   Discharge Condition: Stable CODE STATUS:FULL Diet recommendation: FULL Liquid; Osmolite 1.5 /hr   Brief/Interim Summary: 48 year old female with a history of alcohol induced chronic pancreatitis, failure to thrive, pancreatic pseudocyst, anxiety/depression, and chronic abdominal pain presenting with nausea, vomiting, and worsening abdominal pain for 2 to 3 days duration.  The patient was recently discharged from the hospital after stay from 11/01/2018 through 11/07/2018.  She was treated for acute on chronic pancreatitis and aspiration pneumonitis.  During that hospitalization, the patient also had a brief episode of paroxysmal atrial fibrillation.  She was discharged home to continue enteral feedings (Osmolite) at 40 cc/h.  Unfortunately, the patient experienced worsening abdominal pain with nausea and vomiting.  As result, the patient represented for further evaluation.  11/11/2018 CT abdomen of the pelvis showed a distended stomach with wall thickening secondary to underdistention versus gastritis.  There was also thickened walls of the anterior and proximal transverse colon.  There was increased size of the pseudocyst at the head of the pancreas.  Once again, GI was consulted to assist with management.  GI felt her enlarging pseudocysts maybe contributing.  GI arranged transfer to [facility] for EUS/ERCP.  The patient was recently also discharged from the hospital after a long stay from 10/14/2018 through 10/23/2018. During that hospital stay, the patient was treated for acute on chronic pancreatitis as well as failure to thrive. EGD was performed on 10/16/2018 which was  unremarkable and did not show gastric outlet obstruction. She initially had an NJ tube, but when this was dislodged, gastrostomy tube was ultimately placed. She was sent home with Osmolite enteral feedings.She states that she has drank fluids by mouth, but has not put any thing of substance or solid by mouth. She has some nausea when her gastrostomy tube is flushed, but has emesis occasionally when she takes fluids by mouth. Over the last 2 weeks, the emesis has resolved and she has tolerated full liquids by mouth. She has not drank alcohol in over 2 months. She has not smoked in over 1 month.  She denies any headache, sore throat, coughing, hemoptysis.  She denies any new medications, but states that she has been taking 2 ibuprofen on a daily basis.   Discharge Diagnoses:  Acute on chronic pancreatitis -Referral has been sent to [facility], Dr. [name] for EUS/ERCP--scheduled for 11/15/18 at 1120 AM -GI has arranged for pt to be discharged by private vehicle and be transported to [facility] by her boyfriend in early am 6/4 -[facility]on has been d/c'd as pt unable to afford. Restart tube feeds. Miralax.  -Continue IV fluids -11/11/2018 CT abdomen and pelvis--increase size of pseudocyst in head of pancreas - lipase downtrending803>>>262>>>137>>>101  Chronic abdominal pain -Multifactorial including constipation, urinary retention, acute on chronic pancreatitis and pseudocysts -Continue morphine IV prn  Failure to thrive -Continue enteral feeding after ERCP/EUS -continue ProStat -pt continues to turn off enteral feedings herself without notifying nursing despite numerous discussions not to do so -she will be poorly compliant with enteral feedings at home  Resolving Hemoperitoneum: -seen on imaging5/3, 5/21 and 5/31 -Hbremainsstable. -Continue to monitor. -Unclear etiology. -CTA abd if Hgb drops significantly -Hgb has remained stable -11/11/18 CT abd-continued evolutionary  change with some wall formation and decrease in attenuation  ParoxysmalAtrial Fibrillation:seen on EKG last 5/22-23 PM. TSH is wnl. repeat EKG 5/23 with NSR. -Continue tele-no recurrence -5/23 Echo--EF 55-60, normal diastolic fxn, no WMA -Chadsvasc is 1. No indication for anticoagulation at this pointgiven short duration and acute medical condition -Currently in sinus rhythm.No further episodes  Hypokalemia Hypomagnesemia: -am BMP and mag  AnemiaofchronicDisease -iron saturation 12% -ferritin 366 -B12 329 -folate 14.2 -continue iron supplement via PEG daily   Discharge Instructions   Allergies as of 11/15/2018      Reactions   Doxycycline Other (See Comments)   headaches   Sulfa Antibiotics Nausea And Vomiting, Other (See Comments)   Blurred vision and headaches      Medication List    STOP taking these medications   hydrOXYzine 25 MG tablet Commonly known as:  ATARAX/VISTARIL   oxyCODONE 5 MG/5ML solution Commonly known as:  ROXICODONE     TAKE these medications   calcium carbonate (dosed in mg elemental calcium) 1250 MG/5ML Susp Take 10 mLs (1,000 mg of elemental calcium total) by mouth 3 (three) times daily with meals for 30 days.   feeding supplement (OSMOLITE 1.5 CAL) Liqd Place 1,000 mLs into feeding tube continuous.   feeding supplement (PRO-STAT SUGAR FREE 64) Liqd Take 30 mLs by mouth 2 (two) times daily.   ferrous sulfate 300 (60 Fe) MG/5ML syrup Place 5 mLs (300 mg total) into feeding tube daily with breakfast for 30 days.   folic acid 1 MG tablet Commonly known as:  FOLVITE Place 1 tablet (1 mg total) into feeding tube daily for 30 days.   imipramine 10 MG tablet Commonly known as:  TOFRANIL Place 1 tablet (10 mg total) into feeding tube at bedtime.   pantoprazole sodium 40 mg/20 mL Pack Commonly known as:  PROTONIX Place 20 mLs (40 mg total) into feeding tube 2 (two) times daily before a meal for 30 days.   polyethylene glycol  17 g packet Commonly known as:  MIRALAX / GLYCOLAX Place 17 g into feeding tube daily as needed for up to 30 days for mild constipation.   sertraline 50 MG tablet Commonly known as:  ZOLOFT Place 1 tablet (50 mg total) into feeding tube daily.   thiamine 100 MG tablet Place 1 tablet (100 mg total) into feeding tube daily for 30 days.       Allergies  Allergen Reactions   Doxycycline Other (See Comments)    headaches   Sulfa Antibiotics Nausea And Vomiting and Other (See Comments)    Blurred vision and headaches    Consultations:  [facility]   Procedures/Studies: Ct Abdomen Pelvis W Contrast  Result Date: 11/11/2018 CLINICAL DATA:  Abdominal pain since last night. EXAM: CT ABDOMEN AND PELVIS WITH CONTRAST TECHNIQUE: Multidetector CT imaging of the abdomen and pelvis was performed using the standard protocol following bolus administration of intravenous contrast. CONTRAST:  75 mL OMNIPAQUE IOHEXOL 300 MG/ML  SOLN COMPARISON:  CT abdomen and pelvis 10/31/2017 and 10/14/2018. FINDINGS: Lower chest: Dependent atelectasis right lung base noted. No pleural or pericardial effusion. Hepatobiliary: No focal liver abnormality is seen. No gallstones, gallbladder wall thickening, or biliary dilatation. Pancreas: Previously seen fluid collection in the head of the pancreas which had measured 2.7 x 2.1 cm is increased in size to 3.4 x 4.2 cm. Also seen is a fluid collection anterior to the medial aspect of the left kidney measuring approximately 5.1 x 2.8 cm in the axial plane. Previously  seen fluid collection anterior to the mid body of the pancreas on the most recent CT has markedly decreased in size. Spleen: Normal in size without focal abnormality. Adrenals/Urinary Tract: Cyst upper pole left kidney is noted. The kidneys are otherwise unremarkable. Ureters and urinary bladder appear normal. The adrenal glands are normal appearance. Stomach/Bowel: Peg tube is in place as on the most recent  examination. Although decompressed, the walls of the distal stomach are thickened. There is no evidence of bowel obstruction. Wall thickening of the ascending and proximal transverse colon could be due to under distention or colitis. The colon is otherwise unremarkable. No pneumatosis, portal venous gas or free air. Vascular/Lymphatic: Aortic atherosclerosis. No enlarged abdominal or pelvic lymph nodes. Reproductive: Uterus and bilateral adnexa are unremarkable. Other: Small volume of hemoperitoneum seen in the pelvis on the 10/14/2018 stay demonstrates evolutionary change with some wall formation posteriorly and decreased in attenuation. Musculoskeletal: No acute abnormality. IMPRESSION: Thickening of the walls of the distal stomach could be due to under distention but may also be secondary to gastritis. Thickening of the walls of the ascending and proximal transverse colon could be due to colitis or under distension. Increase in the size of a pseudocyst in the head of the pancreas. New pseudocyst in the left upper quadrant anterior to the left kidney is identified. Electronically Signed   By: Drusilla Kanner M.D.   On: 11/11/2018 15:09   Ct Abdomen Pelvis W Contrast  Result Date: 11/01/2018 CLINICAL DATA:  Generalized abdominal pain EXAM: CT ABDOMEN AND PELVIS WITH CONTRAST TECHNIQUE: Multidetector CT imaging of the abdomen and pelvis was performed using the standard protocol following bolus administration of intravenous contrast. CONTRAST:  OMNIPAQUE IOHEXOL 300 MG/ML  SOLN COMPARISON:  CT abdomen pelvis 10/14/2018 FINDINGS: LOWER CHEST: Small pleural effusions and bibasilar atelectasis. HEPATOBILIARY: The hepatic contours and density are normal. There is no intra- or extrahepatic biliary dilatation. The gallbladder is normal. PANCREAS: There are multiple peripancreatic cystic lesions. The most superior, near the gastro esophageal junction, now measures 1.7 cm, previously 2.5 cm. Collection along the  anterior pancreatic body measures 3.3 x 3.2 cm, previously 2.4 x 1.8 cm. Collection of the pancreatic head measures 2.7 x 2.0 cm, previously 4.0 x 2.9 cm. Chronic atrophy of the pancreatic tail and distal body. SPLEEN: Normal. ADRENALS/URINARY TRACT: --Adrenal glands: Normal. --Right kidney/ureter: No hydronephrosis, nephroureterolithiasis, perinephric stranding or solid renal mass. --Left kidney/ureter: No hydronephrosis, nephroureterolithiasis, perinephric stranding or solid renal mass. --Urinary bladder: Markedly dilated. STOMACH/BOWEL: --Stomach/Duodenum: Gastrostomy tube is present within the stomach. --Small bowel: No dilatation or inflammation. --Colon: The cecum is markedly stool distended. --Appendix: Normal. VASCULAR/LYMPHATIC: Normal course and caliber of the major abdominal vessels. No abdominal or pelvic lymphadenopathy. REPRODUCTIVE: Normal uterus and ovaries. Small amount of free fluid in the pelvis. MUSCULOSKELETAL. No bony spinal canal stenosis or focal osseous abnormality. OTHER: None. IMPRESSION: 1. Multiple upper abdominal fluid collections, including collections in the pancreatic head and body and at the gastroesophageal junction, with mixed changes in size; the pancreatic body collection showing the greatest growth and the pancreatic head collection showing the greatest decrease. These collections are most consistent with pancreatic pseudocysts. No specific findings of infection, though this would be difficult to entirely exclude. 2. Distended and stool-filled cecum. 3. Markedly distended urinary bladder without hydronephrosis. 4. Decreased density of fluid in the posterior cul-de-sac, likely resolving hemoperitoneum. Electronically Signed   By: Deatra Robinson M.D.   On: 11/01/2018 22:05   Dg Chest [facility] [address]  Result Date: 11/11/2018 CLINICAL DATA:  Onset abdominal pain last night. EXAM: PORTABLE CHEST 1 VIEW COMPARISON:  Single-view of the chest 11/01/2018. PA and lateral chest 10/09/2014.  FINDINGS: Small focus of atelectasis in the periphery of the light lung bases noted. Lungs otherwise clear. Heart size normal. No pneumothorax or pleural effusion. No acute or focal bony abnormality. IMPRESSION: Minimal left basilar atelectasis.  Lungs otherwise clear. Electronically Signed   By: Drusilla Kanner M.D.   On: 11/11/2018 17:00   Dg Chest Portable 1 View  Result Date: 11/01/2018 CLINICAL DATA:  Fever EXAM: PORTABLE CHEST 1 VIEW COMPARISON:  Chest x-ray dated 10/09/2014 FINDINGS: Cardiac silhouette is enlarged. There is blunting of the right costophrenic angle with a right basilar airspace opacity. There is no pneumothorax. No large pleural effusion. There may be generalized volume overload. There is nonspecific gaseous distention of the partially visualized stomach. IMPRESSION: 1. Blunting of the right costophrenic angle favored to be secondary to a combination of a trace right-sided effusion and atelectasis. An infiltrate or aspiration is not excluded. 2. Mild cardiomegaly. Electronically Signed   By: Katherine Mantle M.D.   On: 11/01/2018 18:23   Dg Vangie Bicker G Tube Plc W/fl W/rad  Result Date: 10/17/2018 CLINICAL DATA:  Feeding tube placement, chronic pancreatitis, weight loss, failure to thrive EXAM: NASO G TUBE PLACEMENT WITH FL AND WITH RAD CONTRAST:  15 mL ISOVUE-300 IOPAMIDOL (ISOVUE-300) INJECTION 61% FLUOROSCOPY TIME:  Fluoroscopy Time:  3 minutes 12 seconds Radiation Exposure Index (if provided by the fluoroscopic device): 33.9 mGy Number of Acquired Spot Images: Single cine fluoroscopic series COMPARISON:  CT abdomen and pelvis 10/14/2018 FINDINGS: Pharynx was anesthetized with Cetacaine spray. Utilizing lubricant, a feeding tube was passed through the RIGHT nasal passages in the hypopharynx. With patient swallowing and neck flexed, feeding tube was advanced into the thoracic esophagus and down into the stomach. Catheter was manipulated and placed across the pylorus into the duodenum.  Catheter was advanced to the level of ligament of Treitz. Contrast injection confirms presence of the catheter tip at the ligament of Treitz. IMPRESSION: Placement of nasogastric feeding tube to the ligament of Treitz. Electronically Signed   By: Ulyses Southward M.D.   On: 10/17/2018 18:07        Discharge Exam: Vitals:   11/14/18 2119 11/15/18 0603  BP: 107/61 109/81  Pulse: (!) 113 (!) 106  Resp: 20 20  Temp: 98.7 F (37.1 C) 98.4 F (36.9 C)  SpO2: 96%    Vitals:   11/14/18 1452 11/14/18 2009 11/14/18 2119 11/15/18 0603  BP: 103/70  107/61 109/81  Pulse: (!) 115  (!) 113 (!) 106  Resp: 16  20 20   Temp: 98.6 F (37 C)  98.7 F (37.1 C) 98.4 F (36.9 C)  TempSrc: Oral  Oral Oral  SpO2: 100% 96% 96%   Weight:      Height:        General: Pt is alert, awake, not in acute distress Cardiovascular: RRR, S1/S2 +, no rubs, no gallops Respiratory: CTA bilaterally, no wheezing, no rhonchi Abdominal: Soft, NT, ND, bowel sounds +; Gtube site without erythema or drainage Extremities: no edema, no cyanosis   The results of significant diagnostics from this hospitalization (including imaging, microbiology, ancillary and laboratory) are listed below for reference.    Significant Diagnostic Studies: Ct Abdomen Pelvis W Contrast  Result Date: 11/11/2018 CLINICAL DATA:  Abdominal pain since last night. EXAM: CT ABDOMEN AND PELVIS WITH CONTRAST TECHNIQUE: Multidetector CT imaging of the  abdomen and pelvis was performed using the standard protocol following bolus administration of intravenous contrast. CONTRAST:  75 mL OMNIPAQUE IOHEXOL 300 MG/ML  SOLN COMPARISON:  CT abdomen and pelvis 10/31/2017 and 10/14/2018. FINDINGS: Lower chest: Dependent atelectasis right lung base noted. No pleural or pericardial effusion. Hepatobiliary: No focal liver abnormality is seen. No gallstones, gallbladder wall thickening, or biliary dilatation. Pancreas: Previously seen fluid collection in the head of the  pancreas which had measured 2.7 x 2.1 cm is increased in size to 3.4 x 4.2 cm. Also seen is a fluid collection anterior to the medial aspect of the left kidney measuring approximately 5.1 x 2.8 cm in the axial plane. Previously seen fluid collection anterior to the mid body of the pancreas on the most recent CT has markedly decreased in size. Spleen: Normal in size without focal abnormality. Adrenals/Urinary Tract: Cyst upper pole left kidney is noted. The kidneys are otherwise unremarkable. Ureters and urinary bladder appear normal. The adrenal glands are normal appearance. Stomach/Bowel: Peg tube is in place as on the most recent examination. Although decompressed, the walls of the distal stomach are thickened. There is no evidence of bowel obstruction. Wall thickening of the ascending and proximal transverse colon could be due to under distention or colitis. The colon is otherwise unremarkable. No pneumatosis, portal venous gas or free air. Vascular/Lymphatic: Aortic atherosclerosis. No enlarged abdominal or pelvic lymph nodes. Reproductive: Uterus and bilateral adnexa are unremarkable. Other: Small volume of hemoperitoneum seen in the pelvis on the 10/14/2018 stay demonstrates evolutionary change with some wall formation posteriorly and decreased in attenuation. Musculoskeletal: No acute abnormality. IMPRESSION: Thickening of the walls of the distal stomach could be due to under distention but may also be secondary to gastritis. Thickening of the walls of the ascending and proximal transverse colon could be due to colitis or under distension. Increase in the size of a pseudocyst in the head of the pancreas. New pseudocyst in the left upper quadrant anterior to the left kidney is identified. Electronically Signed   By: Drusilla Kanner M.D.   On: 11/11/2018 15:09   Ct Abdomen Pelvis W Contrast  Result Date: 11/01/2018 CLINICAL DATA:  Generalized abdominal pain EXAM: CT ABDOMEN AND PELVIS WITH CONTRAST  TECHNIQUE: Multidetector CT imaging of the abdomen and pelvis was performed using the standard protocol following bolus administration of intravenous contrast. CONTRAST:  OMNIPAQUE IOHEXOL 300 MG/ML  SOLN COMPARISON:  CT abdomen pelvis 10/14/2018 FINDINGS: LOWER CHEST: Small pleural effusions and bibasilar atelectasis. HEPATOBILIARY: The hepatic contours and density are normal. There is no intra- or extrahepatic biliary dilatation. The gallbladder is normal. PANCREAS: There are multiple peripancreatic cystic lesions. The most superior, near the gastro esophageal junction, now measures 1.7 cm, previously 2.5 cm. Collection along the anterior pancreatic body measures 3.3 x 3.2 cm, previously 2.4 x 1.8 cm. Collection of the pancreatic head measures 2.7 x 2.0 cm, previously 4.0 x 2.9 cm. Chronic atrophy of the pancreatic tail and distal body. SPLEEN: Normal. ADRENALS/URINARY TRACT: --Adrenal glands: Normal. --Right kidney/ureter: No hydronephrosis, nephroureterolithiasis, perinephric stranding or solid renal mass. --Left kidney/ureter: No hydronephrosis, nephroureterolithiasis, perinephric stranding or solid renal mass. --Urinary bladder: Markedly dilated. STOMACH/BOWEL: --Stomach/Duodenum: Gastrostomy tube is present within the stomach. --Small bowel: No dilatation or inflammation. --Colon: The cecum is markedly stool distended. --Appendix: Normal. VASCULAR/LYMPHATIC: Normal course and caliber of the major abdominal vessels. No abdominal or pelvic lymphadenopathy. REPRODUCTIVE: Normal uterus and ovaries. Small amount of free fluid in the pelvis. MUSCULOSKELETAL. No bony spinal canal  stenosis or focal osseous abnormality. OTHER: None. IMPRESSION: 1. Multiple upper abdominal fluid collections, including collections in the pancreatic head and body and at the gastroesophageal junction, with mixed changes in size; the pancreatic body collection showing the greatest growth and the pancreatic head collection showing the  greatest decrease. These collections are most consistent with pancreatic pseudocysts. No specific findings of infection, though this would be difficult to entirely exclude. 2. Distended and stool-filled cecum. 3. Markedly distended urinary bladder without hydronephrosis. 4. Decreased density of fluid in the posterior cul-de-sac, likely resolving hemoperitoneum. Electronically Signed   By: Deatra Robinson M.D.   On: 11/01/2018 22:05   Dg Chest Port 1 View  Result Date: 11/11/2018 CLINICAL DATA:  Onset abdominal pain last night. EXAM: PORTABLE CHEST 1 VIEW COMPARISON:  Single-view of the chest 11/01/2018. PA and lateral chest 10/09/2014. FINDINGS: Small focus of atelectasis in the periphery of the light lung bases noted. Lungs otherwise clear. Heart size normal. No pneumothorax or pleural effusion. No acute or focal bony abnormality. IMPRESSION: Minimal left basilar atelectasis.  Lungs otherwise clear. Electronically Signed   By: Drusilla Kanner M.D.   On: 11/11/2018 17:00   Dg Chest Portable 1 View  Result Date: 11/01/2018 CLINICAL DATA:  Fever EXAM: PORTABLE CHEST 1 VIEW COMPARISON:  Chest x-ray dated 10/09/2014 FINDINGS: Cardiac silhouette is enlarged. There is blunting of the right costophrenic angle with a right basilar airspace opacity. There is no pneumothorax. No large pleural effusion. There may be generalized volume overload. There is nonspecific gaseous distention of the partially visualized stomach. IMPRESSION: 1. Blunting of the right costophrenic angle favored to be secondary to a combination of a trace right-sided effusion and atelectasis. An infiltrate or aspiration is not excluded. 2. Mild cardiomegaly. Electronically Signed   By: Katherine Mantle M.D.   On: 11/01/2018 18:23   Dg Vangie Bicker G Tube Plc W/fl W/rad  Result Date: 10/17/2018 CLINICAL DATA:  Feeding tube placement, chronic pancreatitis, weight loss, failure to thrive EXAM: NASO G TUBE PLACEMENT WITH FL AND WITH RAD CONTRAST:  15 mL  ISOVUE-300 IOPAMIDOL (ISOVUE-300) INJECTION 61% FLUOROSCOPY TIME:  Fluoroscopy Time:  3 minutes 12 seconds Radiation Exposure Index (if provided by the fluoroscopic device): 33.9 mGy Number of Acquired Spot Images: Single cine fluoroscopic series COMPARISON:  CT abdomen and pelvis 10/14/2018 FINDINGS: Pharynx was anesthetized with Cetacaine spray. Utilizing lubricant, a feeding tube was passed through the RIGHT nasal passages in the hypopharynx. With patient swallowing and neck flexed, feeding tube was advanced into the thoracic esophagus and down into the stomach. Catheter was manipulated and placed across the pylorus into the duodenum. Catheter was advanced to the level of ligament of Treitz. Contrast injection confirms presence of the catheter tip at the ligament of Treitz. IMPRESSION: Placement of nasogastric feeding tube to the ligament of Treitz. Electronically Signed   By: Ulyses Southward M.D.   On: 10/17/2018 18:07     Microbiology: Recent Results (from the past 240 hour(s))  SARS Coronavirus 2 (CEPHEID - Performed in [facility] hospital lab), Hosp Order     Status: None   Collection Time: 11/11/18  5:25 PM  Result Value Ref Range Status   SARS Coronavirus 2 NEGATIVE NEGATIVE Final    Comment: (NOTE) If result is NEGATIVE SARS-CoV-2 target nucleic acids are NOT DETECTED. The SARS-CoV-2 RNA is generally detectable in upper and lower  respiratory specimens during the acute phase of infection. The lowest  concentration of SARS-CoV-2 viral copies this assay can detect is 250  copies / mL. A negative result does not preclude SARS-CoV-2 infection  and should not be used as the sole basis for treatment or other  patient management decisions.  A negative result may occur with  improper specimen collection / handling, submission of specimen other  than nasopharyngeal swab, presence of viral mutation(s) within the  areas targeted by this assay, and inadequate number of viral copies  (<250 copies /  mL). A negative result must be combined with clinical  observations, patient history, and epidemiological information. If result is POSITIVE SARS-CoV-2 target nucleic acids are DETECTED. The SARS-CoV-2 RNA is generally detectable in upper and lower  respiratory specimens dur ing the acute phase of infection.  Positive  results are indicative of active infection with SARS-CoV-2.  Clinical  correlation with patient history and other diagnostic information is  necessary to determine patient infection status.  Positive results do  not rule out bacterial infection or co-infection with other viruses. If result is PRESUMPTIVE POSTIVE SARS-CoV-2 nucleic acids MAY BE PRESENT.   A presumptive positive result was obtained on the submitted specimen  and confirmed on repeat testing.  While 2019 novel coronavirus  (SARS-CoV-2) nucleic acids may be present in the submitted sample  additional confirmatory testing may be necessary for epidemiological  and / or clinical management purposes  to differentiate between  SARS-CoV-2 and other Sarbecovirus currently known to infect humans.  If clinically indicated additional testing with an alternate test  methodology [phone]) is advised. The SARS-CoV-2 RNA is generally  detectable in upper and lower respiratory sp ecimens during the acute  phase of infection. The expected result is Negative. Fact Sheet for Patients:  BoilerBrush.com.cy Fact Sheet for Healthcare Providers: https://pope.com/ This test is not yet approved or cleared by the [place] FDA and has been authorized for detection and/or diagnosis of SARS-CoV-2 by FDA under an Emergency Use Authorization (EUA).  This EUA will remain in effect (meaning this test can be used) for the duration of the COVID-19 declaration under Section 564(b)(1) of the Act, 21 U.S.C. section 360bbb-3(b)(1), unless the authorization is terminated or revoked  sooner. Performed at [facility], [address] [place], [place] [address]      Labs: Basic Metabolic Panel: Recent Labs  Lab 11/11/18 1000 11/12/18 0532 11/13/18 1050 11/14/18 0448 11/15/18 0448  NA 137 138 138 138 141  K 3.9 3.9 3.4* 3.3* 3.2*  CL 98 103 107 105 103  CO2 25 25 19* 24 27  GLUCOSE 150* 94 63* 126* 106*  BUN 22* 5*  CREATININE 0.33* 0.35* 0.36* 0.33* 0.36*  CALCIUM 8.8* 8.4* 7.8* 8.0* 8.0*  MG  --   --   --   --  1.8   Liver Function Tests: Recent Labs  Lab 11/11/18 1000 11/12/18 0532 11/14/18 0448  AST 34 26 26  ALT 36 28 28  ALKPHOS 114 99 89  BILITOT 0.5 0.1* 0.2*  PROT 7.1 6.1* 5.5*  ALBUMIN 2.9* 2.5* 2.3*   Recent Labs  Lab 11/11/18 1000 11/12/18 0532 11/13/18 1050 11/14/18 0448  LIPASE 340* 106* 93* 101*   No results for input(s): AMMONIA in the last 168 hours. CBC: Recent Labs  Lab 11/11/18 1000 11/12/18 0532 11/13/18 1050 11/14/18 0448  WBC 12.6* 7.7 6.9 7.0  NEUTROABS 10.5*  --  5.6  --   HGB 9.3* 7.9* 7.1* 8.2*  HCT 30.2* 26.5* 24.3* 27.3*  MCV 97.4 98.9 101.7* 99.6  PLT 549* 437* 371 491*   Cardiac  Enzymes: Recent Labs  Lab 11/11/18 1620  TROPONINI <0.03   BNP: Invalid input(s): POCBNP CBG: No results for input(s): GLUCAP in the last 168 hours.  Time coordinating discharge:  36 minutes  Signed:  Catarina Hartshorn, DO [facility] Pager: [phone] 11/15/2018, 6:46 AM

## 2018-11-14 NOTE — Progress Notes (Signed)
Subjective:  Having 10/10 pain now after eating bites of breakfast. Had bm this morning.   Objective: Vital signs in last 24 hours: Temp:  [97.5 F (36.4 C)-98.5 F (36.9 C)] 97.5 F (36.4 C) (06/03 0454) Pulse Rate:  [105-116] 116 (06/03 0454) Resp:  [16-20] 16 (06/03 0454) BP: (113-124)/(74-84) 114/84 (06/03 0454) SpO2:  [94 %-100 %] 100 % (06/03 0454) Weight:  [43 kg] 43 kg (06/03 0454) Last BM Date: 11/11/18 General:   Alert, chronically ill appearing WF, appears uncomfortable but no acute distress Head:  Normocephalic and atraumatic. Eyes:  Sclera clear, no icterus.  Abdomen:  Soft, moderate tenderness around the left abdomen. Normal bowel sounds, without guarding, and without rebound.   Extremities:  Without clubbing, deformity or edema. Neurologic:  Alert and  oriented x4;  grossly normal neurologically. Skin:  Intact without significant lesions or rashes. Psych:  Alert and cooperative. Normal mood and affect.  Intake/Output from previous day: 06/02 0701 - 06/03 0700 In: 360 [P.O.:360] Out: 1200 [Urine:1200] Intake/Output this shift: No intake/output data recorded.  Lab Results: CBC Recent Labs    11/12/18 0532 11/13/18 1050 11/14/18 0448  WBC 7.7 6.9 7.0  HGB 7.9* 7.1* 8.2*  HCT 26.5* 24.3* 27.3*  MCV 98.9 101.7* 99.6  PLT 437* 371 491*   BMET Recent Labs    11/12/18 0532 11/13/18 1050 11/14/18 0448  NA 138 138 138  K 3.9 3.4* 3.3*  CL 103 107 105  CO2 25 19* 24  GLUCOSE 94 63* 126*  BUN 16 12 7   CREATININE 0.35* 0.36* 0.33*  CALCIUM 8.4* 7.8* 8.0*   LFTs Recent Labs    11/11/18 1000 11/12/18 0532 11/14/18 0448  BILITOT 0.5 0.1* 0.2*  ALKPHOS 114 99 89  AST 34 26 26  ALT 36 28 28  PROT 7.1 6.1* 5.5*  ALBUMIN 2.9* 2.5* 2.3*   Recent Labs    11/12/18 0532 11/13/18 1050 11/14/18 0448  LIPASE 106* 93* 101*   PT/INR No results for input(s): LABPROT, INR in the last 72 hours.    Imaging Studies: Ct Abdomen Pelvis W  Contrast  Result Date: 11/11/2018 CLINICAL DATA:  Abdominal pain since last night. EXAM: CT ABDOMEN AND PELVIS WITH CONTRAST TECHNIQUE: Multidetector CT imaging of the abdomen and pelvis was performed using the standard protocol following bolus administration of intravenous contrast. CONTRAST:  75 mL OMNIPAQUE IOHEXOL 300 MG/ML  SOLN COMPARISON:  CT abdomen and pelvis 10/31/2017 and 10/14/2018. FINDINGS: Lower chest: Dependent atelectasis right lung base noted. No pleural or pericardial effusion. Hepatobiliary: No focal liver abnormality is seen. No gallstones, gallbladder wall thickening, or biliary dilatation. Pancreas: Previously seen fluid collection in the head of the pancreas which had measured 2.7 x 2.1 cm is increased in size to 3.4 x 4.2 cm. Also seen is a fluid collection anterior to the medial aspect of the left kidney measuring approximately 5.1 x 2.8 cm in the axial plane. Previously seen fluid collection anterior to the mid body of the pancreas on the most recent CT has markedly decreased in size. Spleen: Normal in size without focal abnormality. Adrenals/Urinary Tract: Cyst upper pole left kidney is noted. The kidneys are otherwise unremarkable. Ureters and urinary bladder appear normal. The adrenal glands are normal appearance. Stomach/Bowel: Peg tube is in place as on the most recent examination. Although decompressed, the walls of the distal stomach are thickened. There is no evidence of bowel obstruction. Wall thickening of the ascending and proximal transverse colon could be due to under  distention or colitis. The colon is otherwise unremarkable. No pneumatosis, portal venous gas or free air. Vascular/Lymphatic: Aortic atherosclerosis. No enlarged abdominal or pelvic lymph nodes. Reproductive: Uterus and bilateral adnexa are unremarkable. Other: Small volume of hemoperitoneum seen in the pelvis on the 10/14/2018 stay demonstrates evolutionary change with some wall formation posteriorly and  decreased in attenuation. Musculoskeletal: No acute abnormality. IMPRESSION: Thickening of the walls of the distal stomach could be due to under distention but may also be secondary to gastritis. Thickening of the walls of the ascending and proximal transverse colon could be due to colitis or under distension. Increase in the size of a pseudocyst in the head of the pancreas. New pseudocyst in the left upper quadrant anterior to the left kidney is identified. Electronically Signed   By: Drusilla Kanner M.D.   On: 11/11/2018 15:09   Ct Abdomen Pelvis W Contrast  Result Date: 11/01/2018 CLINICAL DATA:  Generalized abdominal pain EXAM: CT ABDOMEN AND PELVIS WITH CONTRAST TECHNIQUE: Multidetector CT imaging of the abdomen and pelvis was performed using the standard protocol following bolus administration of intravenous contrast. CONTRAST:  OMNIPAQUE IOHEXOL 300 MG/ML  SOLN COMPARISON:  CT abdomen pelvis 10/14/2018 FINDINGS: LOWER CHEST: Small pleural effusions and bibasilar atelectasis. HEPATOBILIARY: The hepatic contours and density are normal. There is no intra- or extrahepatic biliary dilatation. The gallbladder is normal. PANCREAS: There are multiple peripancreatic cystic lesions. The most superior, near the gastro esophageal junction, now measures 1.7 cm, previously 2.5 cm. Collection along the anterior pancreatic body measures 3.3 x 3.2 cm, previously 2.4 x 1.8 cm. Collection of the pancreatic head measures 2.7 x 2.0 cm, previously 4.0 x 2.9 cm. Chronic atrophy of the pancreatic tail and distal body. SPLEEN: Normal. ADRENALS/URINARY TRACT: --Adrenal glands: Normal. --Right kidney/ureter: No hydronephrosis, nephroureterolithiasis, perinephric stranding or solid renal mass. --Left kidney/ureter: No hydronephrosis, nephroureterolithiasis, perinephric stranding or solid renal mass. --Urinary bladder: Markedly dilated. STOMACH/BOWEL: --Stomach/Duodenum: Gastrostomy tube is present within the stomach. --Small  bowel: No dilatation or inflammation. --Colon: The cecum is markedly stool distended. --Appendix: Normal. VASCULAR/LYMPHATIC: Normal course and caliber of the major abdominal vessels. No abdominal or pelvic lymphadenopathy. REPRODUCTIVE: Normal uterus and ovaries. Small amount of free fluid in the pelvis. MUSCULOSKELETAL. No bony spinal canal stenosis or focal osseous abnormality. OTHER: None. IMPRESSION: 1. Multiple upper abdominal fluid collections, including collections in the pancreatic head and body and at the gastroesophageal junction, with mixed changes in size; the pancreatic body collection showing the greatest growth and the pancreatic head collection showing the greatest decrease. These collections are most consistent with pancreatic pseudocysts. No specific findings of infection, though this would be difficult to entirely exclude. 2. Distended and stool-filled cecum. 3. Markedly distended urinary bladder without hydronephrosis. 4. Decreased density of fluid in the posterior cul-de-sac, likely resolving hemoperitoneum. Electronically Signed   By: Deatra Robinson M.D.   On: 11/01/2018 22:05   Dg Chest Port 1 View  Result Date: 11/11/2018 CLINICAL DATA:  Onset abdominal pain last night. EXAM: PORTABLE CHEST 1 VIEW COMPARISON:  Single-view of the chest 11/01/2018. PA and lateral chest 10/09/2014. FINDINGS: Small focus of atelectasis in the periphery of the light lung bases noted. Lungs otherwise clear. Heart size normal. No pneumothorax or pleural effusion. No acute or focal bony abnormality. IMPRESSION: Minimal left basilar atelectasis.  Lungs otherwise clear. Electronically Signed   By: Drusilla Kanner M.D.   On: 11/11/2018 17:00   Dg Chest Portable 1 View  Result Date: 11/01/2018 CLINICAL DATA:  Fever  EXAM: PORTABLE CHEST 1 VIEW COMPARISON:  Chest x-ray dated 10/09/2014 FINDINGS: Cardiac silhouette is enlarged. There is blunting of the right costophrenic angle with a right basilar airspace opacity.  There is no pneumothorax. No large pleural effusion. There may be generalized volume overload. There is nonspecific gaseous distention of the partially visualized stomach. IMPRESSION: 1. Blunting of the right costophrenic angle favored to be secondary to a combination of a trace right-sided effusion and atelectasis. An infiltrate or aspiration is not excluded. 2. Mild cardiomegaly. Electronically Signed   By: Katherine Mantle M.D.   On: 11/01/2018 18:23   Dg Vangie Bicker G Tube Plc W/fl W/rad  Result Date: 10/17/2018 CLINICAL DATA:  Feeding tube placement, chronic pancreatitis, weight loss, failure to thrive EXAM: NASO G TUBE PLACEMENT WITH FL AND WITH RAD CONTRAST:  15 mL ISOVUE-300 IOPAMIDOL (ISOVUE-300) INJECTION 61% FLUOROSCOPY TIME:  Fluoroscopy Time:  3 minutes 12 seconds Radiation Exposure Index (if provided by the fluoroscopic device): 33.9 mGy Number of Acquired Spot Images: Single cine fluoroscopic series COMPARISON:  CT abdomen and pelvis 10/14/2018 FINDINGS: Pharynx was anesthetized with Cetacaine spray. Utilizing lubricant, a feeding tube was passed through the RIGHT nasal passages in the hypopharynx. With patient swallowing and neck flexed, feeding tube was advanced into the thoracic esophagus and down into the stomach. Catheter was manipulated and placed across the pylorus into the duodenum. Catheter was advanced to the level of ligament of Treitz. Contrast injection confirms presence of the catheter tip at the ligament of Treitz. IMPRESSION: Placement of nasogastric feeding tube to the ligament of Treitz. Electronically Signed   By: Ulyses Southward M.D.   On: 10/17/2018 18:07   Dg Esophagus W Single Cm (sol Or Thin Ba)  Result Date: 10/15/2018 CLINICAL DATA:  Dysphagia, lower esophageal and epigastric pain EXAM: ESOPHOGRAM / BARIUM SWALLOW / BARIUM TABLET STUDY TECHNIQUE: Combined double contrast and single contrast examination performed using effervescent crystals, thick barium liquid, and thin barium  liquid. The patient was observed with fluoroscopy swallowing a 13 mm barium sulphate tablet. FLUOROSCOPY TIME:  Fluoroscopy Time:  1 minutes 30 seconds Radiation Exposure Index (if provided by the fluoroscopic device): 17.9 mGy Number of Acquired Spot Images: multiple fluoroscopic screen captures COMPARISON:  None FINDINGS: Esophageal distention: Normal without mass or stricture Filling defects:  None 12.5 mm barium tablet: Easily passed from oral cavity to stomach without obstruction. Motility:  Normal Mucosa:  Smooth without irregularity or ulceration Hypopharynx/cervical esophagus: Normal motion without laryngeal penetration or aspiration Hiatal hernia:  Absent GE reflux:  Not identified during exam Other:  N/A IMPRESSION: Unremarkable esophagram. Electronically Signed   By: Ulyses Southward M.D.   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  CT this admission significant for pseudocyst in the head of the pancreas increasing in size and a new pseudocyst in the left upper quadrant anterior to the left kidney.  Tube feedings on hold as her pain has not been adequately controlled. Recurrent hospitalizations for FTT and pain. PEG placed 10/22/2018.  Anemia: Hemoglobin stable this admission.  Known hemoperitoneum of unknown etiology seen initially May 3 which associated with drop in her hemoglobin.  Currently no signs of overt bleeding.  Hemoperitoneum resolving on imaging.   Plan: 1. Plan for discharge early on June 4 so patient can arrive at [facility] at 1030 for scheduled 1130 procedure. I have tried to contact  Andrey Farmer at both numbers on file to verify he had received instructions from [facility], left message for return call. 2. Continue supportive measures.  3. NPO after midnight.   Leanna Battles. Dixon Boos [facility] [phone] 6/3/202010:38 AM     LOS: 3 days

## 2018-11-14 NOTE — Progress Notes (Signed)
Patient is resting in bed. Patient continues to complain of pain and nausea although patient states that she did not have trouble with her full liquid diet and has hesitation with using her feeding tube. Patient states that she did have a bowel movement this morning. Patient expresses anxiety and nervousness about events that have lead up to her readmission and asks to speak to a chaplain.

## 2018-11-14 NOTE — Progress Notes (Signed)
Spoke to Mr. Harvin Hazel, patient's boyfriend. He has received email from [facility] with information for procedure tomorrow (E[facility]). Scheduled for 11:30, arrive time at 10:30. Mr. Harvin Hazel plans to pick patient up at [facility] at 7:30am tomorrow morning (11/15/18) to allow enough time for transportation. Dr. Arbutus Leas is aware.    Leanna Battles. Dixon Boos [facility] [phone] 6/3/20203:30 PM

## 2018-11-15 DIAGNOSIS — K2900 Acute gastritis without bleeding: Secondary | ICD-10-CM

## 2018-11-15 DIAGNOSIS — K2970 Gastritis, unspecified, without bleeding: Secondary | ICD-10-CM

## 2018-11-15 LAB — MAGNESIUM: Magnesium: 1.8 mg/dL (ref 1.7–2.4)

## 2018-11-15 LAB — BASIC METABOLIC PANEL
Anion gap: 11 (ref 5–15)
BUN: 5 mg/dL — ABNORMAL LOW (ref 6–20)
CO2: 27 mmol/L (ref 22–32)
Calcium: 8 mg/dL — ABNORMAL LOW (ref 8.9–10.3)
Chloride: 103 mmol/L (ref 98–111)
Creatinine, Ser: 0.36 mg/dL — ABNORMAL LOW (ref 0.44–1.00)
GFR calc Af Amer: >60 mL/min (ref >60)
GFR calc non Af Amer: >60 mL/min (ref >60)
Glucose, Bld: 106 mg/dL — ABNORMAL HIGH (ref 70–99)
Potassium: 3.2 mmol/L — ABNORMAL LOW (ref 3.5–5.1)
Sodium: 141 mmol/L (ref 135–145)

## 2018-11-15 MED ORDER — POTASSIUM CHLORIDE 20 MEQ/15ML (10%) PO SOLN
40.0000 meq | Freq: Once | ORAL | Status: AC
  Administered 2018-11-15: 40 meq via ORAL
  Filled 2018-11-15: qty 30

## 2018-11-15 NOTE — Progress Notes (Signed)
Patient's IV site discontinued. Site WNL. Reviewed discharge instructions with patient, who verbalized understanding. Patient to arrive at [facility] at 10:30 for appointment.

## 2019-02-01 ENCOUNTER — Emergency Department (HOSPITAL_COMMUNITY): Payer: Medicaid Other

## 2019-02-01 ENCOUNTER — Emergency Department (HOSPITAL_COMMUNITY)
Admission: EM | Admit: 2019-02-01 | Discharge: 2019-02-01 | Disposition: A | Discharge status: Home / Self Care | Payer: Medicaid Other | Attending: Emergency Medicine | Admitting: Emergency Medicine

## 2019-02-01 ENCOUNTER — Encounter (HOSPITAL_COMMUNITY)
Admission: EM | Disposition: A | Discharge status: Home / Self Care | Payer: Self-pay | Source: Home / Self Care | Attending: Emergency Medicine

## 2019-02-01 ENCOUNTER — Emergency Department (HOSPITAL_COMMUNITY): Payer: Medicaid Other | Admitting: Anesthesiology

## 2019-02-01 ENCOUNTER — Other Ambulatory Visit: Payer: Self-pay

## 2019-02-01 ENCOUNTER — Encounter (HOSPITAL_COMMUNITY): Payer: Self-pay | Admitting: Emergency Medicine

## 2019-02-01 DIAGNOSIS — K9423 Gastrostomy malfunction: Secondary | ICD-10-CM | POA: Diagnosis not present

## 2019-02-01 DIAGNOSIS — T189XXA Foreign body of alimentary tract, part unspecified, initial encounter: Secondary | ICD-10-CM

## 2019-02-01 DIAGNOSIS — F17210 Nicotine dependence, cigarettes, uncomplicated: Secondary | ICD-10-CM | POA: Diagnosis not present

## 2019-02-01 DIAGNOSIS — K219 Gastro-esophageal reflux disease without esophagitis: Secondary | ICD-10-CM | POA: Insufficient documentation

## 2019-02-01 DIAGNOSIS — Z79899 Other long term (current) drug therapy: Secondary | ICD-10-CM | POA: Insufficient documentation

## 2019-02-01 DIAGNOSIS — T85598A Other mechanical complication of other gastrointestinal prosthetic devices, implants and grafts, initial encounter: Secondary | ICD-10-CM

## 2019-02-01 DIAGNOSIS — Z20828 Contact with and (suspected) exposure to other viral communicable diseases: Secondary | ICD-10-CM | POA: Diagnosis not present

## 2019-02-01 HISTORY — PX: ESOPHAGOGASTRODUODENOSCOPY (EGD) WITH PROPOFOL: SHX5813

## 2019-02-01 LAB — I-STAT CHEM 8, ED
BUN: 20 mg/dL (ref 6–20)
Calcium, Ion: 1.1 mmol/L — ABNORMAL LOW (ref 1.15–1.40)
Chloride: 105 mmol/L (ref 98–111)
Creatinine, Ser: 0.5 mg/dL (ref 0.44–1.00)
Glucose, Bld: 131 mg/dL — ABNORMAL HIGH (ref 70–99)
HCT: 36 % (ref 36.0–46.0)
Hemoglobin: 12.2 g/dL (ref 12.0–15.0)
Potassium: 4.9 mmol/L (ref 3.5–5.1)
Sodium: 136 mmol/L (ref 135–145)
TCO2: 26 mmol/L (ref 22–32)

## 2019-02-01 LAB — SARS CORONAVIRUS 2 BY RT PCR (HOSPITAL ORDER, PERFORMED IN ~~LOC~~ HOSPITAL LAB): SARS Coronavirus 2: NEGATIVE

## 2019-02-01 SURGERY — ESOPHAGOGASTRODUODENOSCOPY (EGD) WITH PROPOFOL
Anesthesia: General

## 2019-02-01 MED ORDER — PROPOFOL 10 MG/ML IV BOLUS
INTRAVENOUS | Status: AC
  Filled 2019-02-01: qty 20

## 2019-02-01 MED ORDER — FENTANYL CITRATE (PF) 100 MCG/2ML IJ SOLN
INTRAMUSCULAR | Status: AC
  Filled 2019-02-01: qty 2

## 2019-02-01 MED ORDER — LACTATED RINGERS IV SOLN
INTRAVENOUS | Status: DC | PRN
  Administered 2019-02-01: 06:00:00 via INTRAVENOUS

## 2019-02-01 MED ORDER — SUCCINYLCHOLINE CHLORIDE 20 MG/ML IJ SOLN
INTRAMUSCULAR | Status: DC | PRN
  Administered 2019-02-01: 100 mg via INTRAVENOUS

## 2019-02-01 MED ORDER — FENTANYL CITRATE (PF) 100 MCG/2ML IJ SOLN
INTRAMUSCULAR | Status: DC | PRN
  Administered 2019-02-01 (×2): 50 ug via INTRAVENOUS

## 2019-02-01 MED ORDER — PROPOFOL 10 MG/ML IV BOLUS
INTRAVENOUS | Status: DC | PRN
  Administered 2019-02-01: 140 mg via INTRAVENOUS
  Administered 2019-02-01: 60 mg via INTRAVENOUS

## 2019-02-01 MED ORDER — ONDANSETRON HCL 4 MG/2ML IJ SOLN
INTRAMUSCULAR | Status: DC | PRN
  Administered 2019-02-01: 4 mg via INTRAVENOUS

## 2019-02-01 MED ORDER — STERILE WATER FOR IRRIGATION IR SOLN
Status: DC | PRN
  Administered 2019-02-01: 2.5 mL

## 2019-02-01 NOTE — ED Provider Notes (Signed)
[facility] EMERGENCY DEPARTMENT Provider Note   CSN: [id number] Arrival date & time: 02/01/19  0154   Time seen 3:15AM  History   Chief Complaint Chief Complaint  Patient presents with  . Feeding Tube Problem    HPI Annamaria BootsRobin J Pulaski is a 48 y.o. female.     HPI patient states about 130 this morning she was changing the pads on her feeding tube and she accidentally cut the feeding tube off.  She states she was smoking weed and doing it by the light of the TV.  She states she had a placed for severe pancreatitis however she had a stent placed and she has been eating now for the past month.  She has not been using the feeding tube for the past month.  PCP Waldon ReiningBrowning, Douglas, MD   Past Medical History:  Diagnosis Date  . Alcohol abuse   . Anxiety   . Bacterial vaginosis   . Chronic abdominal pain   . Chronic pain   . Depression   . GERD (gastroesophageal reflux disease)   . IBS (irritable bowel syndrome)   . Pancreatitis    ETOH  . Panic attacks   . Psoriasis     Patient Active Problem List   Diagnosis Date Noted  . Foreign body alimentary tract 02/01/2019  . Gastritis   . Colitis   . Pancreatitis, recurrent 11/11/2018  . Leukocytosis 11/11/2018  . Anemia 11/11/2018  . Acute pancreatitis 11/11/2018  . Elevated lipase   . Sepsis (HCC)   . Pressure injury of skin 11/02/2018  . Generalized abdominal pain   . Pancreatic pseudocyst 11/01/2018  . Odynophagia   . Protein-calorie malnutrition, severe 10/16/2018  . Chronic pancreatitis (HCC)   . Dysphagia   . Acute on chronic pancreatitis (HCC) 10/14/2018  . Adult failure to thrive 10/14/2018  . GERD (gastroesophageal reflux disease) 10/14/2018  . Dehydration 10/14/2018  . 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 Facial cellulitis 09/10/2012  . Insect bite 09/10/2012  . Depression 09/10/2012  . Anxiety 09/10/2012  . Hypokalemia 09/10/2012  . Tobacco abuse  09/10/2012  . Alcohol dependence (HCC) 06/28/2012  . Benzodiazepine abuse, continuous (HCC) 06/28/2012  . Suicide threat or attempt 06/28/2012    Class: Acute  . 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 Diarrhea 11/11/2010  . Nausea & vomiting 11/11/2010  . Vaginal discharge 11/11/2010    Past Surgical History:  Procedure Laterality Date  . BLADDER SURGERY     X 3  . ESOPHAGOGASTRODUODENOSCOPY (EGD) WITH PROPOFOL N/A 10/16/2018   Procedure: ESOPHAGOGASTRODUODENOSCOPY (EGD) WITH PROPOFOL;  Surgeon: Corbin Adeourk, Robert M, MD;  Location: [facility];  Service: Endoscopy;  Laterality: N/A;  . ESOPHAGOGASTRODUODENOSCOPY (EGD) WITH PROPOFOL N/A 10/22/2018   Procedure: ESOPHAGOGASTRODUODENOSCOPY (EGD) WITH PROPOFOL;  Surgeon: Lucretia RoersBridges, Lindsay C, MD;  Location: [facility] ORS;  Service: General;  Laterality: N/A;  . EUS N/A 10/11/2012   Dr. Christella HartiganJacobs: 3.7 cm cystic lesion in body of pancreas with large amount of debris, s/p FNA with reddish, milky fluid, main pancreatic duct normal,   . EUS  12/2015   mild to moderate chronic pancreatitis, pancreatic duct with dilation and intraductal stones, measuring up to 6 mm in diameter, no abnormality in main bile duct, abnormal lymph nodes in perigastric region, largest measuring 12 mm in diameter, s/p fine needle biopsy. Pathology with multiple fragments of lymphoid tissue with granulomatous inflammation, negative GMS and AFB stains,  . PEG PLACEMENT N/A 10/22/2018   Procedure: PERCUTANEOUS ENDOSCOPIC  GASTROSTOMY (PEG) PLACEMENT with endoscopy;  Surgeon: Virl Cagey, MD;  Location: [facility] ORS;  Service: General;  Laterality: N/A;     OB History   No obstetric history on file.      Home Medications    Prior to Admission medications   Medication Sig Start Date End Date Taking? Authorizing Provider  Amino Acids-Protein Hydrolys (FEEDING SUPPLEMENT, PRO-STAT SUGAR FREE 64,) LIQD Take 30 mLs by mouth 2 (two) times daily. 10/23/18   Manuella Ghazi, Pratik D, DO  ferrous sulfate 300 (60  Fe) MG/5ML syrup Place 5 mLs (300 mg total) into feeding tube daily with breakfast for 30 days. 11/08/18 12/08/18  Johnson, Clanford L, MD  imipramine (TOFRANIL) 10 MG tablet Place 1 tablet (10 mg total) into feeding tube at bedtime. 11/07/18   Johnson, Clanford L, MD  Nutritional Supplements (FEEDING SUPPLEMENT, OSMOLITE 1.5 CAL,) LIQD Place 1,000 mLs into feeding tube continuous. 10/23/18   Manuella Ghazi, Pratik D, DO  pantoprazole sodium (PROTONIX) 40 mg/20 mL PACK Place 20 mLs (40 mg total) into feeding tube 2 (two) times daily before a meal for 30 days. 11/07/18 12/07/18  Johnson, Clanford L, MD  sertraline (ZOLOFT) 50 MG tablet Place 1 tablet (50 mg total) into feeding tube daily. 11/07/18   Murlean Iba, MD    Family History Family History  Problem Relation Age of Onset  . Depression Mother        living  . Pancreatic cancer Father        PATIENT STATES PROSTATE CANCER. UNCLEAR IF ACTUAL PANCREATIC CANCER  . Colon cancer Neg Hx   . Colon polyps Neg Hx     Social History Social History   Tobacco Use  . Smoking status: Current Some Day Smoker    Packs/day: 1.00    Years: 8.00    Pack years: 8.00    Types: Cigarettes  . Smokeless tobacco: Never Used  . Tobacco comment: States no smoking since 5/21  Substance Use Topics  . Alcohol use: Yes    Alcohol/week: 3.0 standard drinks    Types: 3 Cans of beer per week    Comment: last drink 06/05/18, hx of ETOH abuse  . Drug use: Not Currently    Types: Marijuana    Comment: Smoked tonight 02/01/2019     Allergies   Doxycycline and Sulfa antibiotics   Review of Systems Review of Systems  All other systems reviewed and are negative.    Physical Exam Updated Vital Signs BP 137/86   Pulse (!) 123   Temp 97.8 F (36.6 C) (Oral)   Resp 17   LMP 04/12/2013   SpO2 100%   Physical Exam Vitals signs and nursing note reviewed.  Constitutional:      Appearance: Normal appearance.  HENT:     Head: Normocephalic and atraumatic.      Right Ear: External ear normal.     Left Ear: External ear normal.  Eyes:     Extraocular Movements: Extraocular movements intact.     Conjunctiva/sclera: Conjunctivae normal.  Neck:     Musculoskeletal: Normal range of motion.  Cardiovascular:     Rate and Rhythm: Tachycardia present.  Pulmonary:     Effort: Pulmonary effort is normal. No respiratory distress.  Abdominal:     General: Abdomen is flat.     Palpations: Abdomen is soft.     Tenderness: There is no abdominal tenderness.     Comments: The stoma where the feeding tube was is visualized.  There  is small amount of drainage from it.  Patient has the rest of the external feeding tube with her, it was cut very close to where it probably entered the skin.  There is no tube seen in the stoma where the G-tube had been.  Musculoskeletal: Normal range of motion.  Skin:    General: Skin is warm and dry.  Neurological:     General: No focal deficit present.     Mental Status: She is alert and oriented to person, place, and time.     Cranial Nerves: No cranial nerve deficit.  Psychiatric:        Mood and Affect: Mood normal.        Behavior: Behavior normal.        Thought Content: Thought content normal.          ED Treatments / Results  Labs (all labs ordered are listed, but only abnormal results are displayed) Results for orders placed or performed during the hospital encounter of 02/01/19  SARS Coronavirus 2 [facility](Hospital order, [facility]) Nasopharyngeal Nasopharyngeal Swab   Specimen: Nasopharyngeal Swab  Result Value Ref Range   SARS Coronavirus 2 NEGATIVE NEGATIVE  I-stat chem 8, ED (not [facility])  Result Value Ref Range   Sodium 136 135 - 145 mmol/L   Potassium 4.9 3.5 - 5.1 mmol/L   Chloride 105 98 - 111 mmol/L   BUN 20 6 - 20 mg/dL   Creatinine, Ser 1.610.50 0.44 - 1.00 mg/dL   Glucose, Bld 096131 (H) 70 - 99 mg/dL   Calcium, Ion 0.451.10 (L) 1.15 - 1.40 mmol/L   TCO2 26 22 - 32 mmol/L    Hemoglobin 12.2 12.0 - 15.0 g/dL   HCT 40.936.0 81.136.0 - 91.446.0 %   Laboratory interpretation all normal except hyperglycemia   EKG None  Radiology Dg Abdomen 1 View  Result Date: 02/01/2019 CLINICAL DATA:  Patient cut feeding tube while changing dressing. EXAM: ABDOMEN - 1 VIEW COMPARISON:  Chest radiograph and abdominal CT 11/11/2018 FINDINGS: There is tubing coiled over the central abdomen. This cannot be further located radiographically. Possible gastrostomy tube balloon projecting just to the left of this tubing. No evidence of free air. No bowel dilatation to suggest obstruction. Moderate colonic stool burden IMPRESSION: Nonspecific tubing coiled over the central abdomen. Location cannot be further delineated radiographically single-view. Tubing is nonspecific, and may represent cyst gastrostomy tube given pancreatic collections on prior exam. Possible gastrostomy tube balloon projecting just to the left of this tubing. Electronically Signed   By: Narda RutherfordMelanie  Sanford M.D.   On: 02/01/2019 03:52    Procedures  Procedures (including critical care time)   Oct 17, 2018 CLINICAL DATA:  Feeding tube placement, chronic pancreatitis, weight loss, failure to thrive  EXAM: NASO G TUBE PLACEMENT WITH FL AND WITH RAD  With patient swallowing and neck flexed, feeding tube was advanced into the thoracic esophagus and down into the stomach.  Catheter was manipulated and placed across the pylorus into the duodenum.  Catheter was advanced to the level of ligament of Treitz.  Contrast injection confirms presence of the catheter tip at the ligament of Treitz.  IMPRESSION: Placement of nasogastric feeding tube to the ligament of Treitz.   Electronically Signed   By: Ulyses SouthwardMark  Boles M.D.   On: 10/17/2018 18:07  Medications Ordered in ED Medications - No data to display   Initial Impression / Assessment and Plan / ED Course  I have reviewed the  triage vital signs and the nursing notes.   Pertinent labs & imaging results that were available during my care of the patient were reviewed by me and considered in my medical decision making (see chart for details).  When I look at patient's x-ray she has the majority of the feeding tube which appears to be out of her stomach but I am waiting for the official radiology reading.    352 3:55 AM patient was discussed with Dr. Darrick PennaFields, gastroenterologist.  Patient states after she cut the tube she ate popcorn and pizza.  Dr. Darrick PennaFields states the patient will need to be intubated and have propofol.  She is going to speak to the anesthesiologist to see if they can do that tonight or wait until the morning.  COVID testing was done, IV was inserted, patient was made n.p.o.  Approximately 6:20 AM Dr. Darrick PennaFields was here to see patient and take to endoscopy.  Final Clinical Impressions(s) / ED Diagnoses   Final diagnoses:  Feeding tube dysfunction, initial encounter    Plan endoscopy to see if her feeding tube can be removed, if it cannot she will need to go to the OR for open removal.  Devoria AlbeIva Khoury Siemon, MD, Iline OvenFACEP    Camyah Pultz, Jodelle GrossIva, MD 02/01/19 [phone]

## 2019-02-01 NOTE — Op Note (Signed)
[facility] Patient Name: Monique SirenRobin Cahn Procedure Date: 02/01/2019 6:13 AM MRN: [id number] Date of Birth: 11/23/1970 Attending MD: Jonette EvaSandi Krishawna Stiefel MD, MD CSN: [id number] Age: 48 Admit Type: Emergency Department Procedure:                Upper GI endoscopy WITH FOREIGN BODY REMOVAL Indications:              Foreign body in the GI tract Providers:                Jonette EvaSandi Darlynn Ricco MD, MD, Nena PolioLisa Moore, RN, Ina Homeseresa Doss,                            Technician Referring MD:             Waldon Reiningouglas Browning Medicines:                Propofol per Anesthesia Complications:            No immediate complications. Estimated Blood Loss:     Estimated blood loss: none. Procedure:                Pre-Anesthesia Assessment:                           - Prior to the procedure, a History and Physical                            was performed, and patient medications and                            allergies were reviewed. The patient's tolerance of                            previous anesthesia was also reviewed. The risks                            and benefits of the procedure and the sedation                            options and risks were discussed with the patient.                            All questions were answered, and informed consent                            was obtained. Prior Anticoagulants: The patient has                            taken no previous anticoagulant or antiplatelet                            agents. ASA Grade Assessment: II - A patient with                            mild systemic disease. After reviewing the risks  and benefits, the patient was deemed in                            satisfactory condition to undergo the procedure.                            After obtaining informed consent, the peditaric                            colonoscope(3801) was passed under direct vision.                            Throughout the procedure, the patient's blood                         pressure, pulse, and oxygen saturations were                            monitored continuously. The colonoscope was                            introduced through the mouth, and advanced to the                            pylorus. The upper GI endoscopy was accomplished                            without difficulty. The patient tolerated the                            procedure well. Scope In: 6:17:43 AM Scope Out: 6:24:44 AM Total Procedure Duration: 0 hours 7 minutes 1 second  Findings:      A low-grade of narrowing Schatzki ring was found at the gastroesophageal       junction.      Localized mild inflammation characterized by congestion (edema),       erosions and erythema was found in the gastric antrum.      A gastric tube was found in the gastric antrum.      A pigtail catheter was found in the gastric body. Impression:               - Low-grade of narrowing Schatzki ring.                           - MILD Gastritis.                           - A gastric tube was found in the stomach and                            removed via snare/retrieval net.                           - A pigtail catheter was found in the stomach. Moderate Sedation:      Per Anesthesia Care Recommendation:           -  Patient has a contact number available for                            emergencies. The signs and symptoms of potential                            delayed complications were discussed with the                            patient. Return to normal activities tomorrow.                            Written discharge instructions were provided to the                            patient.                           - Resume previous diet.                           - Continue present medications.                           - Return to GI office in 4 months. Procedure Code(s):        --- Professional ---                           [phone], 52, Esophagogastroduodenoscopy, flexible,                             transoral; diagnostic, including collection of                            specimen(s) by brushing or washing, when performed                            (separate procedure) Diagnosis Code(s):        --- Professional ---                           K22.2, Esophageal obstruction                           K29.70, Gastritis, unspecified, without bleeding                           Z93.1, Gastrostomy status                           T18.2XXA, Foreign body in stomach, initial encounter                           T18.9XXA, Foreign body of alimentary tract, part                            unspecified, initial encounter CPT copyright  2019 [employer]. All rights reserved. The codes documented in this report are preliminary and upon coder review may  be revised to meet current compliance requirements. Barney Drain, MD Barney Drain MD, MD 02/01/2019 6:37:14 AM This report has been signed electronically. Number of Addenda: 0

## 2019-02-01 NOTE — Discharge Instructions (Signed)
You have mild gastritis. I REMOVED THE END OF THE FEEDING TUBE.   THE PEG TRACT IS CLOSED AND THE SITE WILL CLOSE COMPLETELY OVER THE NEXT 7-10 DAYS. KEEP A NON-STICK GUAZE SECURED WITH PAPER TAPE OVER SITE AND CHANGE TWICE DAILY OR IF THE DRESSING IS SOILED.   DRINK WATER TO KEEP YOUR URINE LIGHT YELLOW.  FOLLOW A LOW FAT DIET. MEATS SHOULD BE BAKED, BROILED, OR BOILED. AVOID FRIED FOODS. SEE INFO BELOW.  CONTINUE PANTOPRAZOLE.  TAKE 30 MINUTES PRIOR TO YOUR MEALS ONCE OR TWICE A DAY.   Please CALL or SEND me A MY CHART MESSAGE IF YOU HAVE QUESTIONS OR CONCERNS.  FOLLOW UP IN 4 MOS.  UPPER ENDOSCOPY AFTER CARE Read the instructions outlined below and refer to this sheet in the next week. These discharge instructions provide you with general information on caring for yourself after you leave the hospital. While your treatment has been planned according to the most current medical practices available, unavoidable complications occasionally occur. If you have any problems or questions after discharge, call DR. Reginna Sermeno, [phone].  ACTIVITY  You may resume your regular activity, but move at a slower pace for the next 24 hours.   Take frequent rest periods for the next 24 hours.   Walking will help get rid of the air and reduce the bloated feeling in your belly (abdomen).   No driving for 24 hours (because of the medicine (anesthesia) used during the test).   You may shower.   Do not sign any important legal documents or operate any machinery for 24 hours (because of the anesthesia used during the test).    NUTRITION  Drink plenty of fluids.   You may resume your normal diet as instructed by your doctor.   Begin with a light meal and progress to your normal diet. Heavy or fried foods are harder to digest and may make you feel sick to your stomach (nauseated).   Avoid alcoholic beverages for 24 hours or as instructed.    MEDICATIONS  You may resume your normal  medications.   WHAT YOU CAN EXPECT TODAY  Some feelings of bloating in the abdomen.   Passage of more gas than usual.    IF YOU HAD A BIOPSY TAKEN DURING THE UPPER ENDOSCOPY:  Eat a soft diet IF YOU HAVE NAUSEA, BLOATING, ABDOMINAL PAIN, OR VOMITING.    FINDING OUT THE RESULTS OF YOUR TEST Not all test results are available during your visit. DR. Darrick PennaFIELDS WILL CALL YOU WITHIN 14 DAYS OF YOUR PROCEDUE WITH YOUR RESULTS. Do not assume everything is normal if you have not heard from DR. Jorell Agne, CALL HER OFFICE AT [phone].  SEEK IMMEDIATE MEDICAL ATTENTION AND CALL THE OFFICE: [phone] IF:  You have more than a spotting of blood in your stool.   Your belly is swollen (abdominal distention).   You are nauseated or vomiting.   You have a temperature over 101F.   You have abdominal pain or discomfort that is severe or gets worse throughout the day.   Gastritis  Gastritis is an inflammation (the body's way of reacting to injury and/or infection) of the stomach. It is often caused by viral or bacterial (germ) infections. It can also be caused BY ASPIRIN, BC/GOODY POWDER'S, (IBUPROFEN) MOTRIN, OR ALEVE (NAPROXEN), chemicals (including alcohol), SPICY FOODS, and medications. This illness may be associated with generalized malaise (feeling tired, not well), UPPER ABDOMINAL STOMACH cramps, and fever. One common bacterial cause of gastritis is an organism known  as H. Pylori. This can be treated with antibiotics.    Low-Fat Diet  BREADS, CEREALS, PASTA, RICE, DRIED PEAS, AND BEANS These products are high in carbohydrates and most are low in fat. Therefore, they can be increased in the diet as substitutes for fatty foods. They too, however, contain calories and should not be eaten in excess. Cereals can be eaten for snacks as well as for breakfast.  Include foods that contain fiber (fruits, vegetables, whole grains, and legumes). Research shows that fiber may lower blood cholesterol  levels, especially the water-soluble fiber found in fruits, vegetables, oat products, and legumes.  FRUITS AND VEGETABLES It is good to eat fruits and vegetables. Besides being sources of fiber, both are rich in vitamins and some minerals. They help you get the daily allowances of these nutrients. Fruits and vegetables can be used for snacks and desserts.  MEATS Limit lean meat, chicken, [place], and fish to no more than 6 ounces per day.  Beef, Pork, and Lamb Use lean cuts of beef, pork, and lamb. Lean cuts include:  Extra-lean ground beef.  Arm roast.  Sirloin tip.  Center-cut ham.  Round steak.  Loin chops.  Rump roast.  Tenderloin.  Trim all fat off the outside of meats before cooking. It is not necessary to severely decrease the intake of red meat, but lean choices should be made. Lean meat is rich in protein and contains a highly absorbable form of iron. Premenopausal women, in particular, should avoid reducing lean red meat because this could increase the risk for low red blood cells (iron-deficiency anemia).  Chicken and [place] These are good sources of protein. The fat of poultry can be reduced by removing the skin and underlying fat layers before cooking. Chicken and [place] can be substituted for lean red meat in the diet. Poultry should not be fried or covered with high-fat sauces.  Fish and Shellfish Fish is a good source of protein. Shellfish contain cholesterol, but they usually are low in saturated fatty acids. The preparation of fish is important. Like chicken and [place], they should not be fried or covered with high-fat sauces.  EGGS Egg whites contain no fat or cholesterol. They can be eaten often. Try 1 to 2 egg whites instead of whole eggs in recipes or use egg substitutes that do not contain yolk.  MILK AND DAIRY PRODUCTS Use skim or 1% milk instead of 2% or whole milk. Decrease whole milk, natural, and processed cheeses. Use nonfat or low-fat (2%) cottage cheese or  low-fat cheeses made from vegetable oils. Choose nonfat or low-fat (1 to 2%) yogurt. Experiment with evaporated skim milk in recipes that call for heavy cream. Substitute low-fat yogurt or low-fat cottage cheese for sour cream in dips and salad dressings. Have at least 2 servings of low-fat dairy products, such as 2 glasses of skim (or 1%) milk each day to help get your daily calcium intake.  FATS AND OILS Reduce the total intake of fats, especially saturated fat. Butterfat, lard, and beef fats are high in saturated fat and cholesterol. These should be avoided as much as possible. Vegetable fats do not contain cholesterol, but certain vegetable fats, such as coconut oil, palm oil, and palm kernel oil are very high in saturated fats. These should be limited. These fats are often used in bakery goods, processed foods, popcorn, oils, and nondairy creamers. Vegetable shortenings and some peanut butters contain hydrogenated oils, which are also saturated fats. Read the labels on these foods and  check for saturated vegetable oils.  Unsaturated vegetable oils and fats do not raise blood cholesterol. However, they should be limited because they are fats and are high in calories. Total fat should still be limited to 30% of your daily caloric intake. Desirable liquid vegetable oils are corn oil, cottonseed oil, olive oil, canola oil, safflower oil, soybean oil, and sunflower oil. Peanut oil is not as good, but small amounts are acceptable. Buy a heart-healthy tub margarine that has no partially hydrogenated oils in the ingredients. Mayonnaise and salad dressings often are made from unsaturated fats, but they should also be limited because of their high calorie and fat content. Seeds, nuts, peanut butter, olives, and avocados are high in fat, but the fat is mainly the unsaturated type. These foods should be limited mainly to avoid excess calories and fat.  OTHER EATING TIPS Snacks  Most sweets should be limited as  snacks. They tend to be rich in calories and fats, and their caloric content outweighs their nutritional value. Some good choices in snacks are graham crackers, melba toast, soda crackers, bagels (no egg), English muffins, fruits, and vegetables. These snacks are preferable to snack crackers, [place] fries, and chips. Popcorn should be air-popped or cooked in small amounts of liquid vegetable oil.  Desserts Eat fruit, low-fat yogurt, and fruit ices instead of pastries, cake, and cookies. Sherbet, angel food cake, gelatin dessert, frozen low-fat yogurt, or other frozen products that do not contain saturated fat (pure fruit juice bars, frozen ice pops) are also acceptable.   COOKING METHODS Choose those methods that use little or no fat. They include: Poaching.  Braising.  Steaming.  Grilling.  Baking.  Stir-frying.  Broiling.  Microwaving.  Foods can be cooked in a nonstick pan without added fat, or use a nonfat cooking spray in regular cookware. Limit fried foods and avoid frying in saturated fat. Add moisture to lean meats by using water, broth, cooking wines, and other nonfat or low-fat sauces along with the cooking methods mentioned above. Soups and stews should be chilled after cooking. The fat that forms on top after a few hours in the refrigerator should be skimmed off. When preparing meals, avoid using excess salt. Salt can contribute to raising blood pressure in some people.  EATING AWAY FROM HOME Order entres, potatoes, and vegetables without sauces or butter. When meat exceeds the size of a deck of cards (3 to 4 ounces), the rest can be taken home for another meal. Choose vegetable or fruit salads and ask for low-calorie salad dressings to be served on the side. Use dressings sparingly. Limit high-fat toppings, such as bacon, crumbled eggs, cheese, sunflower seeds, and olives. Ask for heart-healthy tub margarine instead of butter.

## 2019-02-01 NOTE — Consult Note (Addendum)
Referring Provider: No ref. provider found Primary Care Physician:  Waldon ReiningBrowning, Douglas, MD Primary Gastroenterologist:  Jonette EvaSandi Lucius Wise  Reason for Consultation:  FEEDING TUBE IN SMALL BOWEL   Impression: ADMITTED TO ED WITH FEEDING TUBE IN SMALL BOWEL.  Plan: 1. ENTEROSCOPY TODAY EMERGENTLY DUE TO RISK OF TUBE MIGRATION INTO THE DISTAL BOWEL AND TO REMOVE FEEDING TUBE.  DISCUSSED PROCEDURE, BENEFITS, & RISKS: < 1% chance of medication reaction, bleeding, perforation, or ASPIRATION. 2. DISCUSSED WITH DR. Lemont FillersNABONSAL. PT NEEDS GENERAL ANESTHESIA.      HPI:  HASN'T BEEN USING FEEDING TUBE BUT CLEANED THE DRESSING AND ACCIDENTALLY CUT THE TUBE. LAST ATE @130  AM: PIZZA/POPCORN. HAS INDIGESTION: MIDDLE CHEST AND ARM PIT. NAUSEA: MILD. BMs: GOOD. ALWAYS FEELS TIRED.PT NOW EATING REGULAR FOOD AND NOT USING THE PEG.  PT DENIES FEVER, CHILLS, HEMATOCHEZIA, HEMATEMESIS, nausea, vomiting, melena, diarrhea, CHEST PAIN, SHORTNESS OF BREATH, CHANGE IN BOWEL IN HABITS, constipation, abdominal pain, problems swallowing, OR problems with sedation.  Past Medical History:  Diagnosis Date  . Alcohol abuse   . Anxiety   . Bacterial vaginosis   . Chronic abdominal pain   . Chronic pain   . Depression   . GERD (gastroesophageal reflux disease)   . IBS (irritable bowel syndrome)   . Pancreatitis    ETOH  . Panic attacks   . Psoriasis    Past Surgical History:  Procedure Laterality Date  . BLADDER SURGERY     X 3  . ESOPHAGOGASTRODUODENOSCOPY (EGD) WITH PROPOFOL N/A 10/16/2018   Procedure: ESOPHAGOGASTRODUODENOSCOPY (EGD) WITH PROPOFOL;  Surgeon: Corbin Adeourk, Robert M, MD;  Location: [facility];  Service: Endoscopy;  Laterality: N/A;  . ESOPHAGOGASTRODUODENOSCOPY (EGD) WITH PROPOFOL N/A 10/22/2018   Procedure: ESOPHAGOGASTRODUODENOSCOPY (EGD) WITH PROPOFOL;  Surgeon: Lucretia RoersBridges, Lindsay C, MD;  Location: [facility];  Service: General;  Laterality: N/A;  . EUS N/A 10/11/2012   Dr. Christella HartiganJacobs: 3.7 cm cystic lesion in body  of pancreas with large amount of debris, s/p FNA with reddish, milky fluid, main pancreatic duct normal,   . EUS  12/2015   mild to moderate chronic pancreatitis, pancreatic duct with dilation and intraductal stones, measuring up to 6 mm in diameter, no abnormality in main bile duct, abnormal lymph nodes in perigastric region, largest measuring 12 mm in diameter, s/p fine needle biopsy. Pathology with multiple fragments of lymphoid tissue with granulomatous inflammation, negative GMS and AFB stains,  . PEG PLACEMENT N/A 10/22/2018   Procedure: PERCUTANEOUS ENDOSCOPIC GASTROSTOMY (PEG) PLACEMENT with endoscopy;  Surgeon: Lucretia RoersBridges, Lindsay C, MD;  Location: [facility];  Service: General;  Laterality: N/A;    Prior to Admission medications   Medication Sig Start Date End Date Taking? Authorizing Provider  Amino Acids-Protein Hydrolys (FEEDING SUPPLEMENT, PRO-STAT SUGAR FREE 64,) LIQD Take 30 mLs by mouth 2 (two) times daily. 10/23/18   Sherryll BurgerShah, Pratik D, DO  ferrous sulfate 300 (60 Fe) MG/5ML syrup Place 5 mLs (300 mg total) into feeding tube daily with breakfast for 30 days. 11/08/18 12/08/18  Johnson, Clanford L, MD  imipramine (TOFRANIL) 10 MG tablet Place 1 tablet (10 mg total) into feeding tube at bedtime. 11/07/18   Johnson, Clanford L, MD  Nutritional Supplements (FEEDING SUPPLEMENT, OSMOLITE 1.5 CAL,) LIQD Place 1,000 mLs into feeding tube continuous. 10/23/18   Sherryll BurgerShah, Pratik D, DO  pantoprazole sodium (PROTONIX) 40 mg/20 mL PACK Place 20 mLs (40 mg total) into feeding tube 2 (two) times daily before a meal for 30 days. 11/07/18 12/07/18  Cleora FleetJohnson, Clanford L, MD  sertraline (  ZOLOFT) 50 MG tablet Place 1 tablet (50 mg total) into feeding tube daily. 11/07/18   Cleora FleetJohnson, Clanford L, MD    No current facility-administered medications for this encounter.    Current Outpatient Medications  Medication Sig Dispense Refill  . Amino Acids-Protein Hydrolys (FEEDING SUPPLEMENT, PRO-STAT SUGAR FREE 64,) LIQD Take 30 mLs  by mouth 2 (two) times daily. 887 mL 0  . ferrous sulfate 300 (60 Fe) MG/5ML syrup Place 5 mLs (300 mg total) into feeding tube daily with breakfast for 30 days. 150 mL 0  . imipramine (TOFRANIL) 10 MG tablet Place 1 tablet (10 mg total) into feeding tube at bedtime.  3  . Nutritional Supplements (FEEDING SUPPLEMENT, OSMOLITE 1.5 CAL,) LIQD Place 1,000 mLs into feeding tube continuous. 1 Bottle 3  . pantoprazole sodium (PROTONIX) 40 mg/20 mL PACK Place 20 mLs (40 mg total) into feeding tube 2 (two) times daily before a meal for 30 days. 1200 mL 0  . sertraline (ZOLOFT) 50 MG tablet Place 1 tablet (50 mg total) into feeding tube daily.      Allergies as of 02/01/2019 - Review Complete 02/01/2019  Allergen Reaction Noted  . Doxycycline Other (See Comments) 07/07/2012  . Sulfa antibiotics Nausea And Vomiting and Other (See Comments) 11/10/2010    Family History  Problem Relation Age of Onset  . Depression Mother        living  . Pancreatic cancer Father        PATIENT STATES PROSTATE CANCER. UNCLEAR IF ACTUAL PANCREATIC CANCER  . Colon cancer Neg Hx   . Colon polyps Neg Hx      Social History   Socioeconomic History  . Marital status: Widowed    Spouse name: Not on file  . Number of children: Not on file  . Years of education: Not on file  . Highest education level: Not on file  Occupational History  . Not on file  Social Needs  . Financial resource strain: Not on file  . Food insecurity    Worry: Not on file    Inability: Not on file  . Transportation needs    Medical: Not on file    Non-medical: Not on file  Tobacco Use  . Smoking status: Current Some Day Smoker    Packs/day: 1.00    Years: 8.00    Pack years: 8.00    Types: Cigarettes  . Smokeless tobacco: Never Used  . Tobacco comment: States no smoking since 5/21  Substance and Sexual Activity  . Alcohol use: Yes    Alcohol/week: 3.0 standard drinks    Types: 3 Cans of beer per week    Comment: last drink  06/05/18, hx of ETOH abuse  . Drug use: Not Currently    Types: Marijuana    Comment: Smoked tonight 02/01/2019  . Sexual activity: Yes    Birth control/protection: None  Lifestyle  . Physical activity    Days per week: Not on file    Minutes per session: Not on file  . Stress: Not on file  Relationships  . Social Musicianconnections    Talks on phone: Not on file    Gets together: Not on file    Attends religious service: Not on file    Active member of club or organization: Not on file    Attends meetings of clubs or organizations: Not on file    Relationship status: Not on file  . Intimate partner violence    Fear of current or ex  partner: Not on file    Emotionally abused: Not on file    Physically abused: Not on file    Forced sexual activity: Not on file  Other Topics Concern  . Not on file  Social History Narrative  . Not on file    Review of Systems: PER HPI OTHERWISE ALL SYSTEMS ARE NEGATIVE.   Vitals: Blood pressure 137/86, pulse (!) 123, temperature 97.8 F (36.6 C), temperature source Oral, resp. rate 17, last menstrual period 04/12/2013, SpO2 100 %.  Physical Exam: General:   Alert,  Well-developed, well-nourished, pleasant and cooperative in NAD Head:  Normocephalic and atraumatic. Eyes:  Sclera clear, no icterus.   Conjunctiva pink. Mouth:  No lesions, dentition normal. Neck:  Supple; no masses. Lungs:  Clear throughout to auscultation.   No wheezes. No acute distress. Heart:  Regular rate and rhythm; no murmurs, clicks, rubs,  or gallops. Abdomen:  Soft, nontender and nondistended. No masses, hepatosplenomegaly or hernias noted. Normal bowel sounds, without guarding, and without rebound.   Msk:  Symmetrical without gross deformities. Normal posture. Extremities:  Without edema. Neurologic:  Alert and  oriented x4;  grossly normal neurologically. Cervical Nodes:  No significant cervical adenopathy. Psych:  Alert and cooperative. Normal mood and  affect.   Lab Results: No results for input(s): WBC, HGB, HCT, PLT in the last 72 hours. BMET No results for input(s): NA, K, CL, CO2, GLUCOSE, BUN, CREATININE, CALCIUM in the last 72 hours. LFT No results for input(s): PROT, ALBUMIN, AST, ALT, ALKPHOS, BILITOT, BILIDIR, IBILI in the last 72 hours.   Studies/Results: KUB AUG 21: DOUBLE PIG TAIL CATHETER SEEN ON IMAGING.   LOS: 0 days   Monique Nguyen  02/01/2019, 4:22 AM

## 2019-02-01 NOTE — Anesthesia Procedure Notes (Signed)
Procedure Name: Intubation Date/Time: 02/01/2019 6:10 AM Performed by: Lenice Llamas, MD Pre-anesthesia Checklist: Patient identified, Patient being monitored, Timeout performed, Emergency Drugs available and Suction available Patient Re-evaluated:Patient Re-evaluated prior to induction Oxygen Delivery Method: Circle System Utilized Preoxygenation: Pre-oxygenation with 100% oxygen Induction Type: IV induction Ventilation: Mask ventilation without difficulty Laryngoscope Size: Mac and 3 Grade View: Grade I Tube type: Oral Tube size: 6.0 mm Number of attempts: 1 Airway Equipment and Method: Stylet Placement Confirmation: ETT inserted through vocal cords under direct vision,  positive ETCO2 and breath sounds checked- equal and bilateral Secured at: 18 cm Tube secured with: Tape Dental Injury: Teeth and Oropharynx as per pre-operative assessment

## 2019-02-01 NOTE — Addendum Note (Signed)
Addendum  created 02/01/19 1438 by Lenice Llamas, MD   Clinical Note Signed

## 2019-02-01 NOTE — Anesthesia Postprocedure Evaluation (Signed)
Anesthesia Post Note  Patient: Monique Nguyen  Procedure(s) Performed: ENTEROSCOPY (N/A )  Patient location during evaluation: PACU Anesthesia Type: General Level of consciousness: awake, awake and alert and oriented Pain management: pain level controlled Vital Signs Assessment: post-procedure vital signs reviewed and stable Respiratory status: spontaneous breathing and nonlabored ventilation Cardiovascular status: blood pressure returned to baseline Postop Assessment: no headache Anesthetic complications: no     Last Vitals:  Vitals:   02/01/19 0500 02/01/19 0631  BP: 125/83 112/73  Pulse: (!) 109 (!) 119  Resp:  18  Temp:  36.8 C  SpO2: 100% 98%    Last Pain:  Vitals:   02/01/19 0631  TempSrc:   PainSc: 0-No pain                 Talbert Forest Chase Arnall

## 2019-02-01 NOTE — Anesthesia Preprocedure Evaluation (Addendum)
Anesthesia Evaluation    Airway Mallampati: II  TM Distance: >3 FB Neck ROM: Full    Dental no notable dental hx. (+) Poor Dentition, Chipped, Missing Very poor dentition:   Pulmonary Current Smoker and Patient abstained from smoking.,    Pulmonary exam normal breath sounds clear to auscultation       Cardiovascular Exercise Tolerance: Good Normal cardiovascular examI Rhythm:Regular Rate:Normal     Neuro/Psych Anxiety Depression    GI/Hepatic GERD  Medicated and Controlled,Cut Gtube herself  Known psychiatric issues  Reports last Psych hospitalization 2014 OD'd   Endo/Other    Renal/GU      Musculoskeletal   Abdominal   Peds  Hematology   Anesthesia Other Findings Smoked weed/ate pizza and popcorn about 1am and cut her own Gtube   Reproductive/Obstetrics                           Anesthesia Physical Anesthesia Plan  ASA: II and emergent  Anesthesia Plan: General   Post-op Pain Management:    Induction: Intravenous  PONV Risk Score and Plan: 2 and Ondansetron and Treatment may vary due to age or medical condition  Airway Management Planned: Oral ETT  Additional Equipment:   Intra-op Plan:   Post-operative Plan: Extubation in OR  Informed Consent: I have reviewed the patients History and Physical, chart, labs and discussed the procedure including the risks, benefits and alternatives for the proposed anesthesia with the patient or authorized representative who has indicated his/her understanding and acceptance.     Dental advisory given  Plan Discussed with: CRNA  Anesthesia Plan Comments: (Plan Full PPE use  Plan GETA d/w pt -WTP after Q&A)        Anesthesia Quick Evaluation

## 2019-02-01 NOTE — ED Triage Notes (Signed)
Pt states she was changing the dressing around her feeding tube and cut the tube by accident.

## 2019-02-01 NOTE — Transfer of Care (Signed)
Immediate Anesthesia Transfer of Care Note  Patient: Monique Nguyen  Procedure(s) Performed: ENTEROSCOPY (N/A )  Patient Location: PACU  Anesthesia Type:General  Level of Consciousness: awake, alert  and oriented  Airway & Oxygen Therapy: Patient Spontanous Breathing  Post-op Assessment: Report given to RN and Post -op Vital signs reviewed and stable  Post vital signs: Reviewed and stable  Last Vitals:  Vitals Value Taken Time  BP    Temp    Pulse    Resp    SpO2      Last Pain:  Vitals:   02/01/19 0631  TempSrc:   PainSc: 0-No pain         Complications: No apparent anesthesia complications

## 2019-02-08 ENCOUNTER — Encounter (HOSPITAL_COMMUNITY): Payer: Self-pay | Admitting: Gastroenterology

## 2019-02-26 ENCOUNTER — Encounter (HOSPITAL_COMMUNITY): Payer: Self-pay | Admitting: *Deleted

## 2019-02-26 ENCOUNTER — Emergency Department (HOSPITAL_COMMUNITY)
Admission: EM | Admit: 2019-02-26 | Discharge: 2019-02-26 | Disposition: A | Discharge status: Home / Self Care | Payer: Medicaid Other | Attending: Emergency Medicine | Admitting: Emergency Medicine

## 2019-02-26 ENCOUNTER — Other Ambulatory Visit: Payer: Self-pay

## 2019-02-26 DIAGNOSIS — K0889 Other specified disorders of teeth and supporting structures: Secondary | ICD-10-CM | POA: Diagnosis not present

## 2019-02-26 DIAGNOSIS — Z79899 Other long term (current) drug therapy: Secondary | ICD-10-CM | POA: Insufficient documentation

## 2019-02-26 DIAGNOSIS — R220 Localized swelling, mass and lump, head: Secondary | ICD-10-CM | POA: Diagnosis present

## 2019-02-26 DIAGNOSIS — F17210 Nicotine dependence, cigarettes, uncomplicated: Secondary | ICD-10-CM | POA: Insufficient documentation

## 2019-02-26 DIAGNOSIS — K047 Periapical abscess without sinus: Secondary | ICD-10-CM

## 2019-02-26 MED ORDER — CLINDAMYCIN PHOSPHATE 600 MG/50ML IV SOLN
600.0000 mg | Freq: Once | INTRAVENOUS | Status: AC
  Administered 2019-02-26: 15:00:00 600 mg via INTRAVENOUS
  Filled 2019-02-26: qty 50

## 2019-02-26 MED ORDER — CLINDAMYCIN HCL 300 MG PO CAPS: 300.0000 mg | ORAL_CAPSULE | Freq: Four times a day (QID) | ORAL | 0 refills | Status: AC

## 2019-02-26 NOTE — ED Triage Notes (Signed)
Swelling of face onset today states it may be related to dental problems.

## 2019-02-26 NOTE — ED Notes (Signed)
Pt up to restroom.

## 2019-02-26 NOTE — ED Provider Notes (Signed)
Medical screening examination/treatment/procedure(s) were conducted as a shared visit with non-physician practitioner(s) and myself.  I personally evaluated the patient during the encounter.  Clinical Impression:   Final diagnoses:  Dental infection    This patient is a pleasant 48 year old female presenting with bilateral dental pain.  On exam she has fullness in her cheeks but no significant tenderness or induration, her gums show diffuse dental disease and periodontal disease but no significant abscesses, she has no trismus or torticollis, normal phonation, normal uvula, no peritonsillar abscess.  She has bilateral shotty lymphadenopathy but a very supple neck.  Mild tachycardia, clear lung sounds, speaks in full sentences.  IV clindamycin followed by a course of oral clindamycin and follow-up with dentist.  Patient agreeable, stable for discharge.   Noemi Chapel, MD 03/07/19 0700

## 2019-02-26 NOTE — Discharge Instructions (Addendum)
Please schedule an appointment ASAP with a local dental clinic. Return to ED or seek medical attention if you develop difficulty breathing, wheezing, or fever uncontrolled with ibuprofen or tylenol.

## 2019-02-26 NOTE — ED Provider Notes (Signed)
[facility] EMERGENCY DEPARTMENT Provider Note   CSN: [id number] Arrival date & time: 02/26/19  1426     History   Chief Complaint Chief Complaint  Patient presents with  . Facial Swelling    HPI Monique Nguyen is a 48 y.o. female who presents to the ER with acute onset bilateral facial swelling and erythema.  Patient reports feeling bilateral dental pain last night has gotten progressively worse.  Approximately 2 hours ago she developed significant erythema and swelling bilaterally.  She denies any recent sickness, fever, chills, difficulty swallowing, sore throat, headache, or focal neurologic deficits.     HPI  Past Medical History:  Diagnosis Date  . Alcohol abuse   . Anxiety   . Bacterial vaginosis   . Chronic abdominal pain   . Chronic pain   . Depression   . GERD (gastroesophageal reflux disease)   . IBS (irritable bowel syndrome)   . Pancreatitis    ETOH  . Panic attacks   . Psoriasis     Patient Active Problem List   Diagnosis Date Noted  . Foreign body alimentary tract 02/01/2019  . Gastritis   . Colitis   . Pancreatitis, recurrent 11/11/2018  . Leukocytosis 11/11/2018  . Anemia 11/11/2018  . Acute pancreatitis 11/11/2018  . Elevated lipase   . Sepsis (HCC)   . Pressure injury of skin 11/02/2018  . Generalized abdominal pain   . Pancreatic pseudocyst 11/01/2018  . Odynophagia   . Protein-calorie malnutrition, severe 10/16/2018  . Chronic pancreatitis (HCC)   . Dysphagia   . Acute on chronic pancreatitis (HCC) 10/14/2018  . Adult failure to thrive 10/14/2018  . GERD (gastroesophageal reflux disease) 10/14/2018  . Dehydration 10/14/2018  . 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 Facial cellulitis 09/10/2012  . Insect bite 09/10/2012  . Depression 09/10/2012  . Anxiety 09/10/2012  . Hypokalemia 09/10/2012  . Tobacco abuse 09/10/2012  . Alcohol dependence (HCC) 06/28/2012  . Benzodiazepine  abuse, continuous (HCC) 06/28/2012  . Suicide threat or attempt 06/28/2012    Class: Acute  . 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 Diarrhea 11/11/2010  . Nausea & vomiting 11/11/2010  . Vaginal discharge 11/11/2010    Past Surgical History:  Procedure Laterality Date  . BLADDER SURGERY     X 3  . ESOPHAGOGASTRODUODENOSCOPY (EGD) WITH PROPOFOL N/A 10/16/2018   Procedure: ESOPHAGOGASTRODUODENOSCOPY (EGD) WITH PROPOFOL;  Surgeon: Corbin Ade, MD;  Location: [facility];  Service: Endoscopy;  Laterality: N/A;  . ESOPHAGOGASTRODUODENOSCOPY (EGD) WITH PROPOFOL N/A 10/22/2018   Procedure: ESOPHAGOGASTRODUODENOSCOPY (EGD) WITH PROPOFOL;  Surgeon: Lucretia Roers, MD;  Location: [facility] ORS;  Service: General;  Laterality: N/A;  . ESOPHAGOGASTRODUODENOSCOPY (EGD) WITH PROPOFOL  02/01/2019   Procedure: ESOPHAGOGASTRODUODENOSCOPY (EGD) WITH PROPOFOL;  Surgeon: West Bali, MD;  Location: [facility];  Service: Endoscopy;;  . EUS N/A 10/11/2012   Dr. Christella Hartigan: 3.7 cm cystic lesion in body of pancreas with large amount of debris, s/p FNA with reddish, milky fluid, main pancreatic duct normal,   . EUS  12/2015   mild to moderate chronic pancreatitis, pancreatic duct with dilation and intraductal stones, measuring up to 6 mm in diameter, no abnormality in main bile duct, abnormal lymph nodes in perigastric region, largest measuring 12 mm in diameter, s/p fine needle biopsy. Pathology with multiple fragments of lymphoid tissue with granulomatous inflammation, negative GMS and AFB stains,  . PEG PLACEMENT N/A 10/22/2018   Procedure: PERCUTANEOUS ENDOSCOPIC GASTROSTOMY (PEG) PLACEMENT  with endoscopy;  Surgeon: Lucretia RoersBridges, Lindsay C, MD;  Location: [facility] ORS;  Service: General;  Laterality: N/A;     OB History   No obstetric history on file.      Home Medications    Prior to Admission medications   Medication Sig Start Date End Date Taking? Authorizing Provider  Amino Acids-Protein Hydrolys (FEEDING  SUPPLEMENT, PRO-STAT SUGAR FREE 64,) LIQD Take 30 mLs by mouth 2 (two) times daily. 10/23/18   Sherryll BurgerShah, Pratik D, DO  clindamycin (CLEOCIN) 300 MG capsule Take 1 capsule (300 mg total) by mouth 4 (four) times daily for 10 days. 02/26/19 03/08/19  Lorelee NewGreen, Tomoko Sandra L, PA-C  ferrous sulfate 300 (60 Fe) MG/5ML syrup Place 5 mLs (300 mg total) into feeding tube daily with breakfast for 30 days. 11/08/18 12/08/18  Johnson, Clanford L, MD  imipramine (TOFRANIL) 10 MG tablet Place 1 tablet (10 mg total) into feeding tube at bedtime. 11/07/18   Johnson, Clanford L, MD  pantoprazole sodium (PROTONIX) 40 mg/20 mL PACK Place 20 mLs (40 mg total) into feeding tube 2 (two) times daily before a meal for 30 days. 11/07/18 12/07/18  Johnson, Clanford L, MD  sertraline (ZOLOFT) 50 MG tablet Place 1 tablet (50 mg total) into feeding tube daily. 11/07/18   Cleora FleetJohnson, Clanford L, MD    Family History Family History  Problem Relation Age of Onset  . Depression Mother        living  . Pancreatic cancer Father        PATIENT STATES PROSTATE CANCER. UNCLEAR IF ACTUAL PANCREATIC CANCER  . Colon cancer Neg Hx   . Colon polyps Neg Hx     Social History Social History   Tobacco Use  . Smoking status: Current Some Day Smoker    Packs/day: 1.00    Years: 8.00    Pack years: 8.00    Types: Cigarettes  . Smokeless tobacco: Never Used  . Tobacco comment: States no smoking since 5/21  Substance Use Topics  . Alcohol use: Yes    Alcohol/week: 3.0 standard drinks    Types: 3 Cans of beer per week    Comment: last drink 06/05/18, hx of ETOH abuse  . Drug use: Not Currently    Types: Marijuana    Comment: Smoked tonight 02/01/2019     Allergies   Doxycycline and Sulfa antibiotics   Review of Systems Review of Systems  All other systems reviewed and are negative.    Physical Exam Updated Vital Signs BP (!) 152/98 (BP Location: Left Arm)   Pulse (!) 102   Temp 98 F (36.7 C)   Resp (!) 22   Ht 5\' 1"  (1.549 m)   Wt  50 kg   LMP 04/12/2013   SpO2 96%   BMI 20.83 kg/m   Physical Exam Constitutional:      Appearance: Normal appearance.  HENT:     Head:     Comments: Erythematous, swollen cheeks bilaterally.  Mild tenderness to palpation.      Mouth/Throat:     Comments: Multiple caries and extensive periodontal disease, but no large abscesses requiring drainage.  No trismus.  No uvular deviation.  No masses appreciated.  Oropharynx is clear and patent.  Eyes:     General: No scleral icterus.    Extraocular Movements: Extraocular movements intact.     Conjunctiva/sclera: Conjunctivae normal.     Pupils: Pupils are equal, round, and reactive to light.  Neck:     Musculoskeletal: Normal range of  motion and neck supple. No neck rigidity or muscular tenderness.  Cardiovascular:     Rate and Rhythm: Normal rate and regular rhythm.     Heart sounds: Normal heart sounds.  Pulmonary:     Effort: Pulmonary effort is normal.     Breath sounds: Normal breath sounds.  Lymphadenopathy:     Cervical: No cervical adenopathy.  Neurological:     Mental Status: She is alert.     GCS: GCS eye subscore is 4. GCS verbal subscore is 5. GCS motor subscore is 6.  Psychiatric:        Mood and Affect: Mood normal.        Behavior: Behavior normal.        Thought Content: Thought content normal.        ED Treatments / Results  Labs (all labs ordered are listed, but only abnormal results are displayed) Labs Reviewed - No data to display  EKG None  Radiology No results found.  Procedures Procedures (including critical care time)  Medications Ordered in ED Medications  clindamycin (CLEOCIN) IVPB 600 mg (600 mg Intravenous New Bag/Given 02/26/19 1526)     Initial Impression / Assessment and Plan / ED Course  I have reviewed the triage vital signs and the nursing notes.  Pertinent labs & imaging results that were available during my care of the patient were reviewed by me and considered in my medical  decision making (see chart for details).       Patient's history physical exam signs are consistent with an infection resulting from her extensive periodontal disease.  No obvious or large abscesses requiring drainage or other intervention.  No evidence of trismus or torticollis.  Patient is speaking without issue and is exhibiting no signs of respiratory distress.  Lung sounds are clear bilaterally.  No evidence of PTA.  Will treat with IV clindamycin here and discharge her with p.o. antibiotics.  Recommended OTC ibuprofen as needed for inflammation and discomfort.  Patient voiced understanding and is agreeable to plan.  Return to the ED immediately if you develop any wheezing or respiratory distress.  Will provide contact information for a dentist here locally and encouraged patient to schedule an appointment as soon as possible given her extensive periodontal disease.   Final Clinical Impressions(s) / ED Diagnoses   Final diagnoses:  Dental infection    ED Discharge Orders         Ordered    clindamycin (CLEOCIN) 300 MG capsule  4 times daily     02/26/19 1535           Reita Chard 02/26/19 1539    Noemi Chapel, MD 03/07/19 [id number]

## 2020-01-23 ENCOUNTER — Other Ambulatory Visit: Payer: Self-pay

## 2020-01-23 ENCOUNTER — Emergency Department (HOSPITAL_COMMUNITY)
Admission: EM | Admit: 2020-01-23 | Discharge: 2020-01-23 | Discharge status: Against Medical Advice | Payer: Medicaid Other

## 2020-04-30 IMAGING — RF ESOPHAGUS/BARIUM SWALLOW/TABLET STUDY
13 of 14 series · 13 of 24 positions shown · non-contrast
Comparison: None

CLINICAL DATA: Dysphagia, lower esophageal and epigastric pain

EXAM:
ESOPHOGRAM / BARIUM SWALLOW / BARIUM TABLET STUDY
TECHNIQUE: Combined double contrast and single contrast examination performed
using effervescent crystals, thick barium liquid, and thin barium
liquid. The patient was observed with fluoroscopy swallowing a 13 mm
barium sulphate tablet.
FLUOROSCOPY TIME:  Fluoroscopy Time:  1 minutes 30 seconds
Radiation Exposure Index (if provided by the fluoroscopic device):
17.9 mGy
Number of Acquired Spot Images: multiple fluoroscopic screen
captures

[Series 1: cp_standard · 0.17mm/px · 1 of 5 frames shown (1 of 13)]
[frame 1/5]
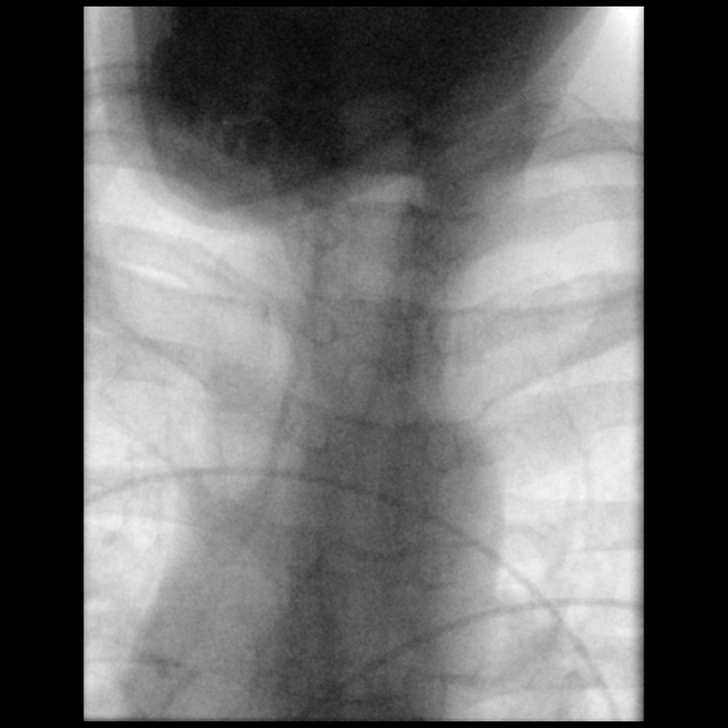

[Series 2: cp_standard · 0.17mm/px · 1 of 28 frames shown (2 of 13)]
[frame 18/28]
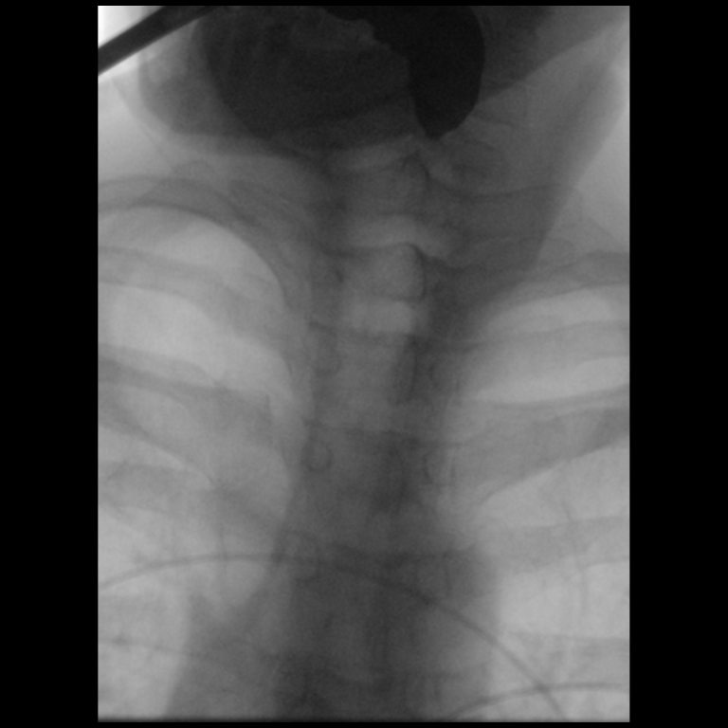

[Series 3: cp_standard · 0.17mm/px · 1 of 15 frames shown (3 of 13)]
[frame 8/15]
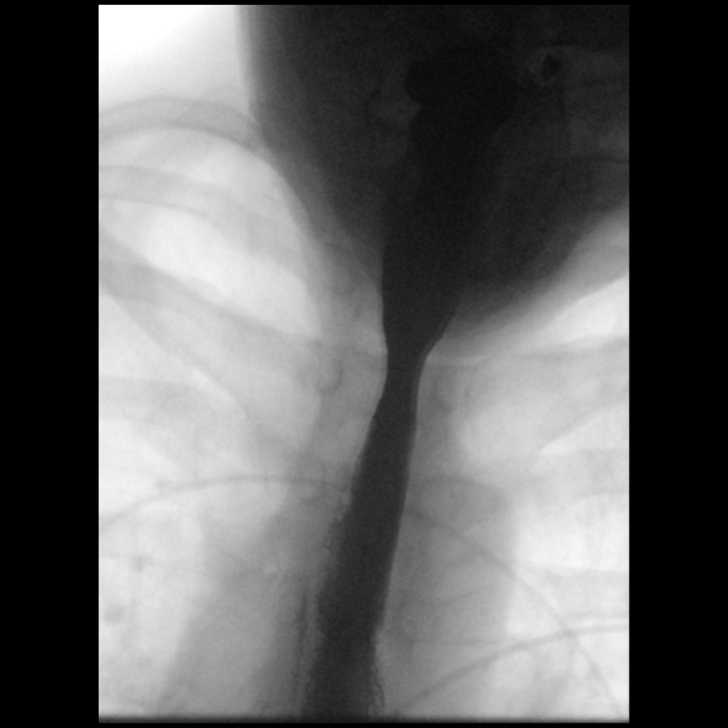

[Series 4: cp_standard · 0.18mm/px · 1 of 33 frames shown (4 of 13)]
[frame 30/33]
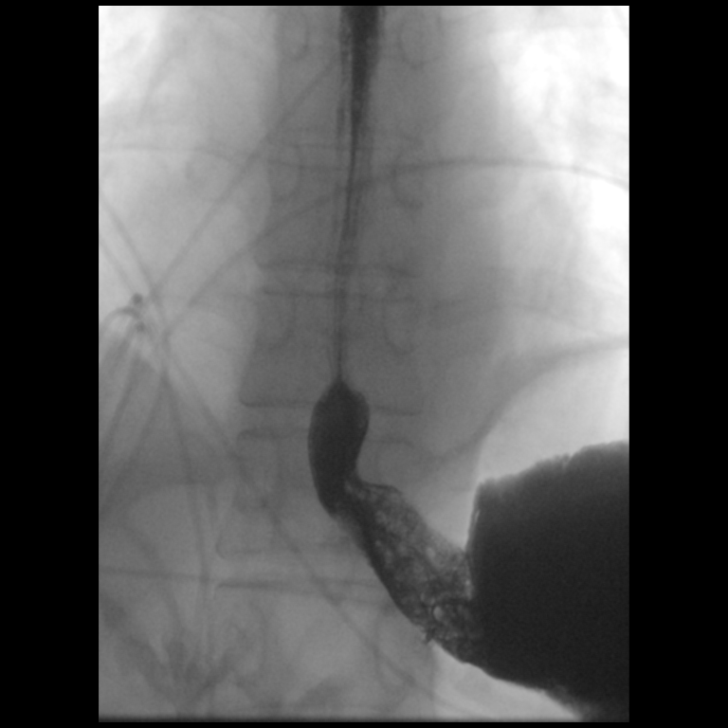

[Series 5: cp_standard · 0.18mm/px · 1 of 14 frames shown (5 of 13)]
[frame 12/14]
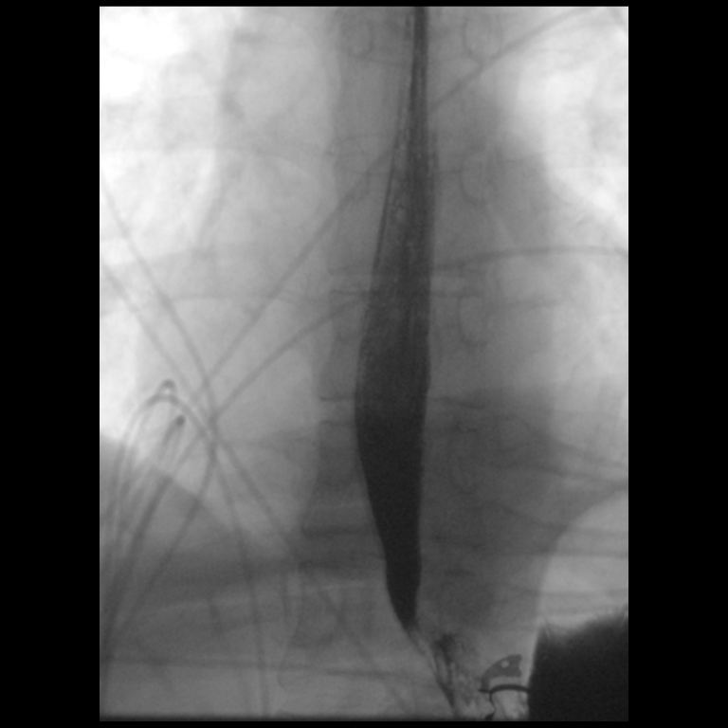

[Series 7: cp_standard · 0.17mm/px · 1 of 25 frames shown (6 of 13)]
[frame 4/25]
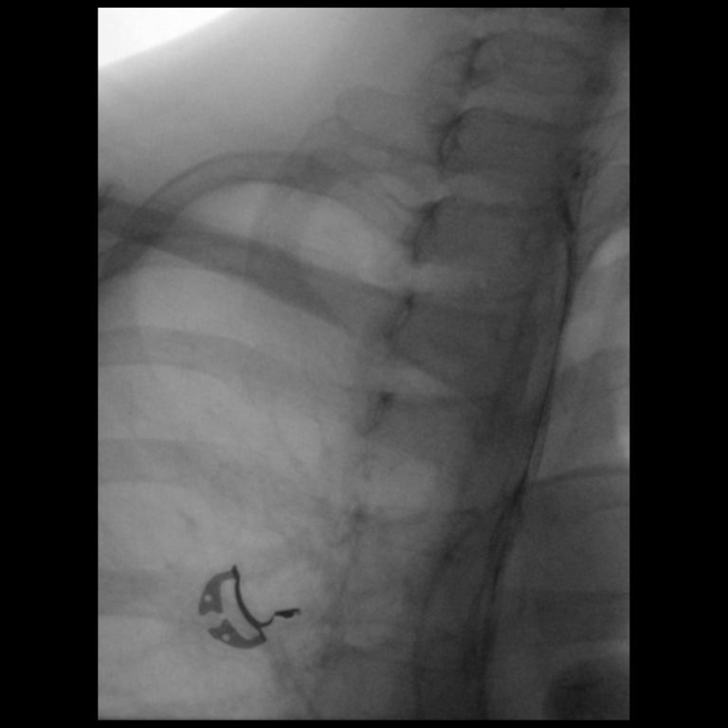

[Series 8: cp_standard · 0.17mm/px · 1 of 41 frames shown (7 of 13)]
[frame 35/41]
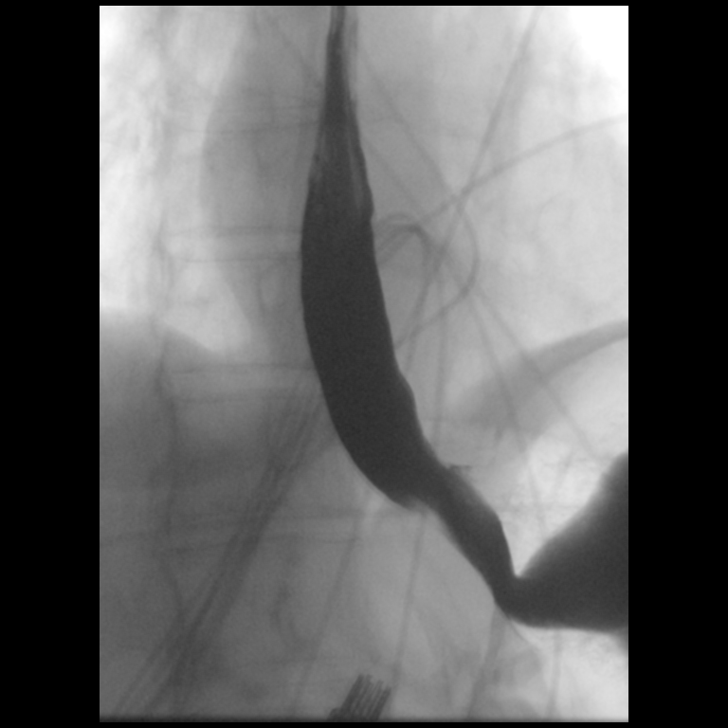

[Series 9: cp_standard · 0.27mm/px · 1 of 131 frames shown (8 of 13)]
[frame 10/131]
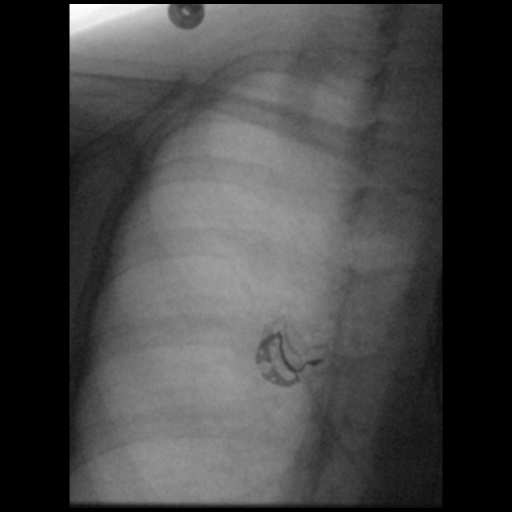

[Series 10: cp_standard · 0.25mm/px · 1 of 168 frames shown (9 of 13)]
[frame 37/168]
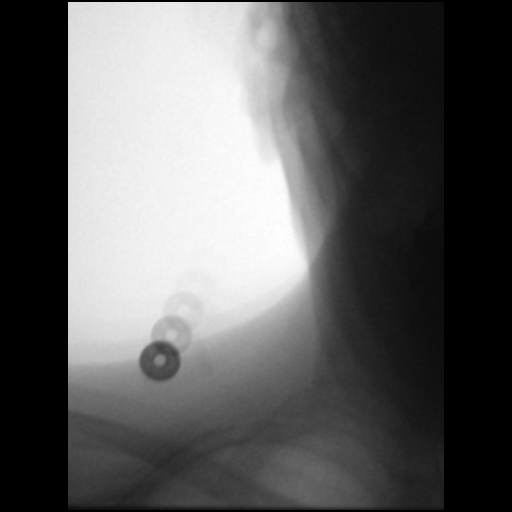

[Series 11: cp_standard · 0.25mm/px · 1 of 122 frames shown (10 of 13)]
[frame 19/122]
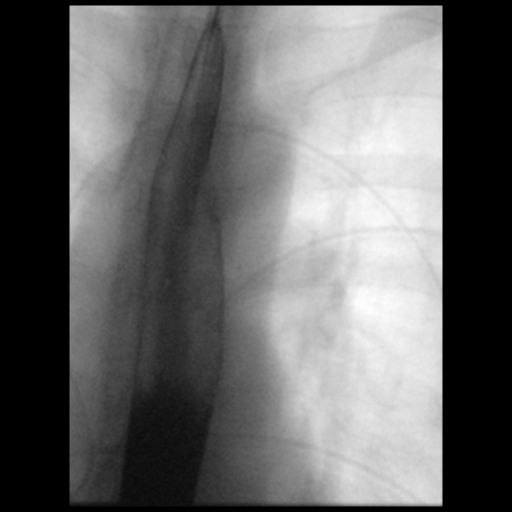

[Series 12: cp_standard · 0.26mm/px · 1 of 369 frames shown (11 of 13)]
[frame 185/369]
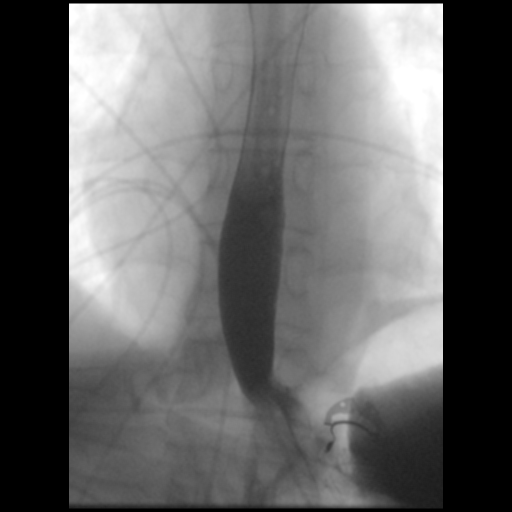

[Series 13: cp_standard · 0.26mm/px · 1 of 72 frames shown (12 of 13)]
[frame 62/72]
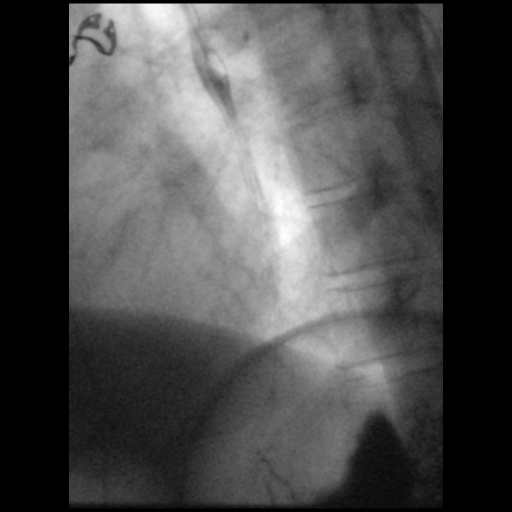

[Series 14: cp_standard · 0.18mm/px · 1 of 93 frames shown (13 of 13)]
[frame 80/93]
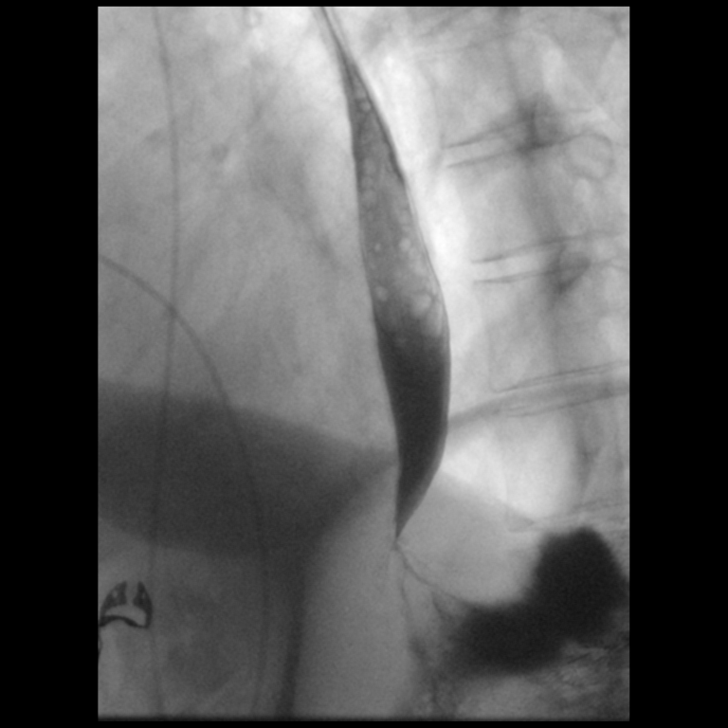

[13 of 24 positions shown; findings below may reference images not displayed]

FINDINGS: Esophageal distention: Normal without mass or stricture

Filling defects:  None

12.5 mm barium tablet: Easily passed from oral cavity to stomach
without obstruction.

Motility:  Normal

Mucosa:  Smooth without irregularity or ulceration

Hypopharynx/cervical esophagus: Normal motion without laryngeal
penetration or aspiration

Hiatal hernia:  Absent

GE reflux:  Not identified during exam

Other:  N/A
IMPRESSION: Unremarkable esophagram.

## 2020-05-02 IMAGING — RF NASO G TUBE PLACEMENT WITH FL AND WITH RAD
1 series · 4 of 4 positions shown · non-contrast
Comparison: CT abdomen and pelvis 10/14/2018

CLINICAL DATA: Feeding tube placement, chronic pancreatitis, weight
loss, failure to thrive

[Series 15: cp_standard · 0.18mm/px · 4 of 148 frames shown]
[frame 16/148]
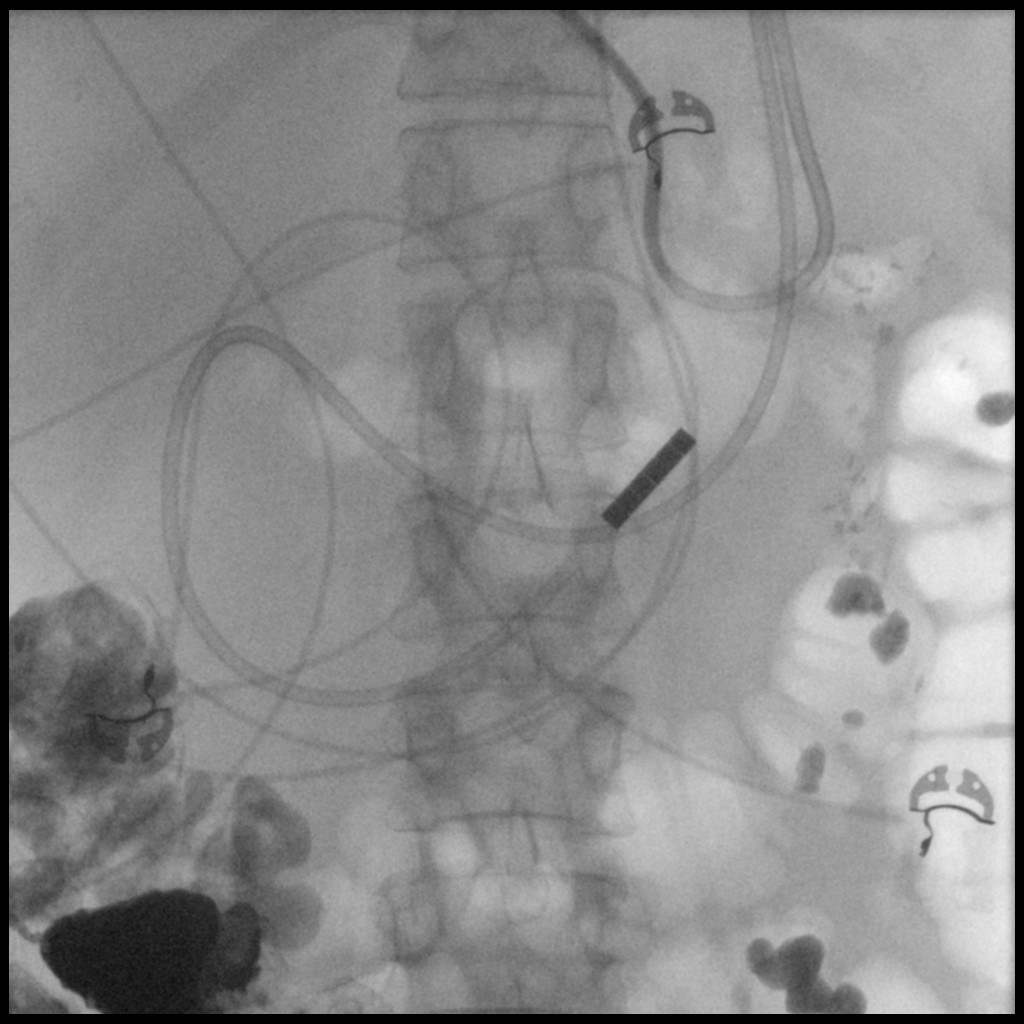
[frame 23/148]
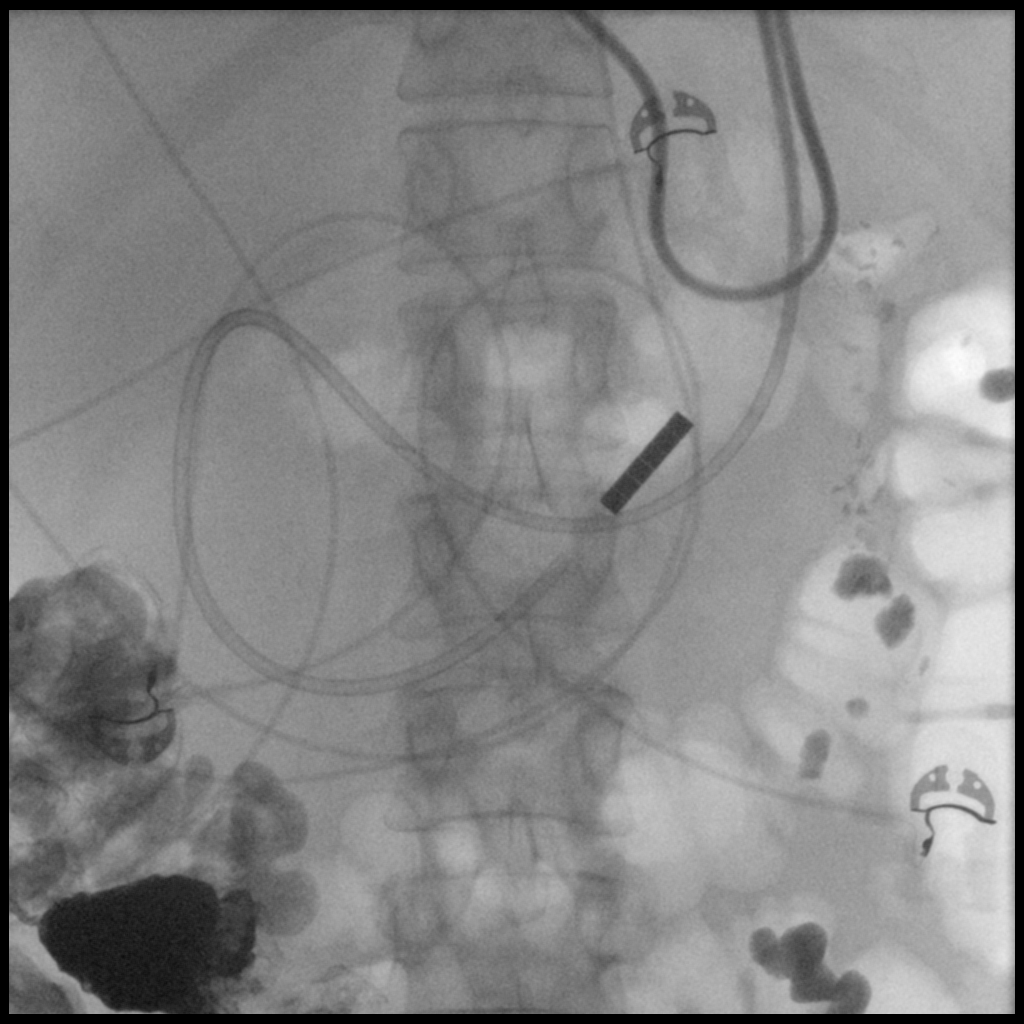
[frame 75/148]
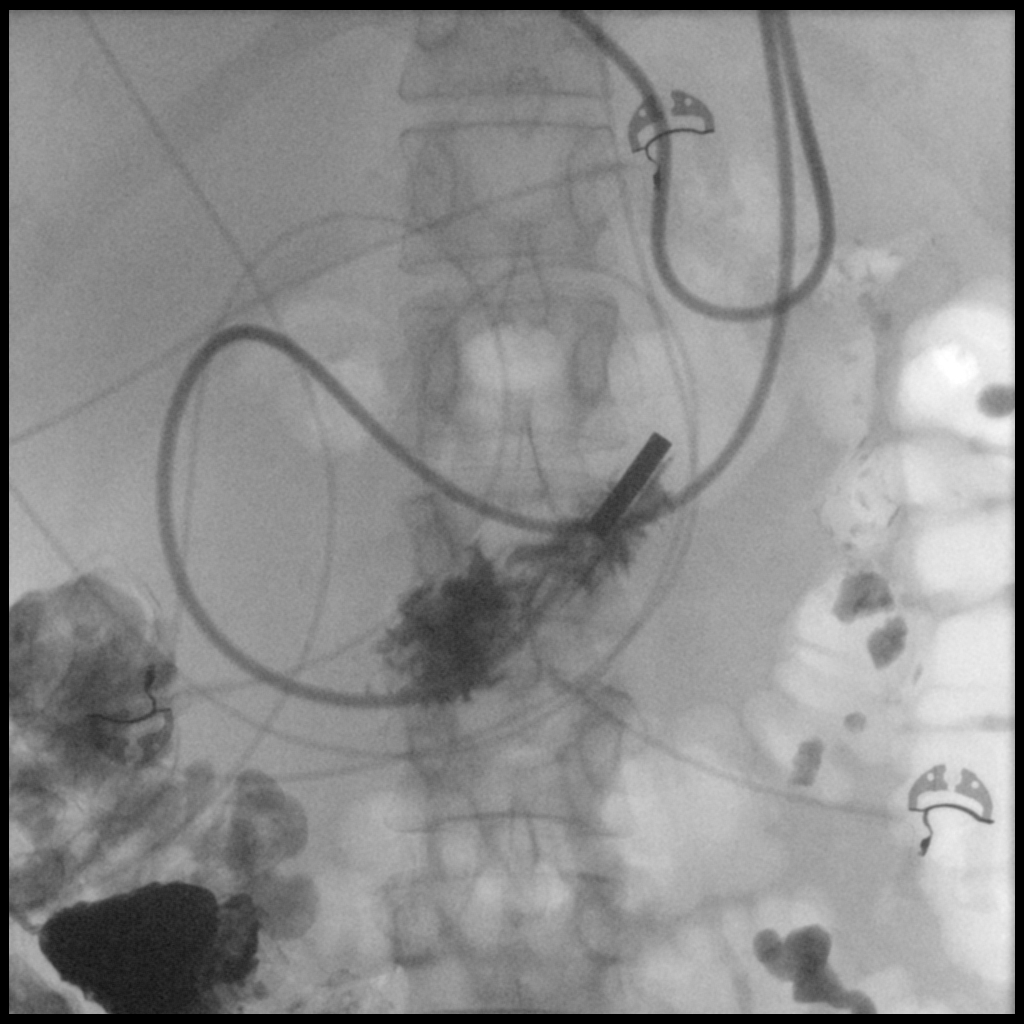
[frame 126/148]
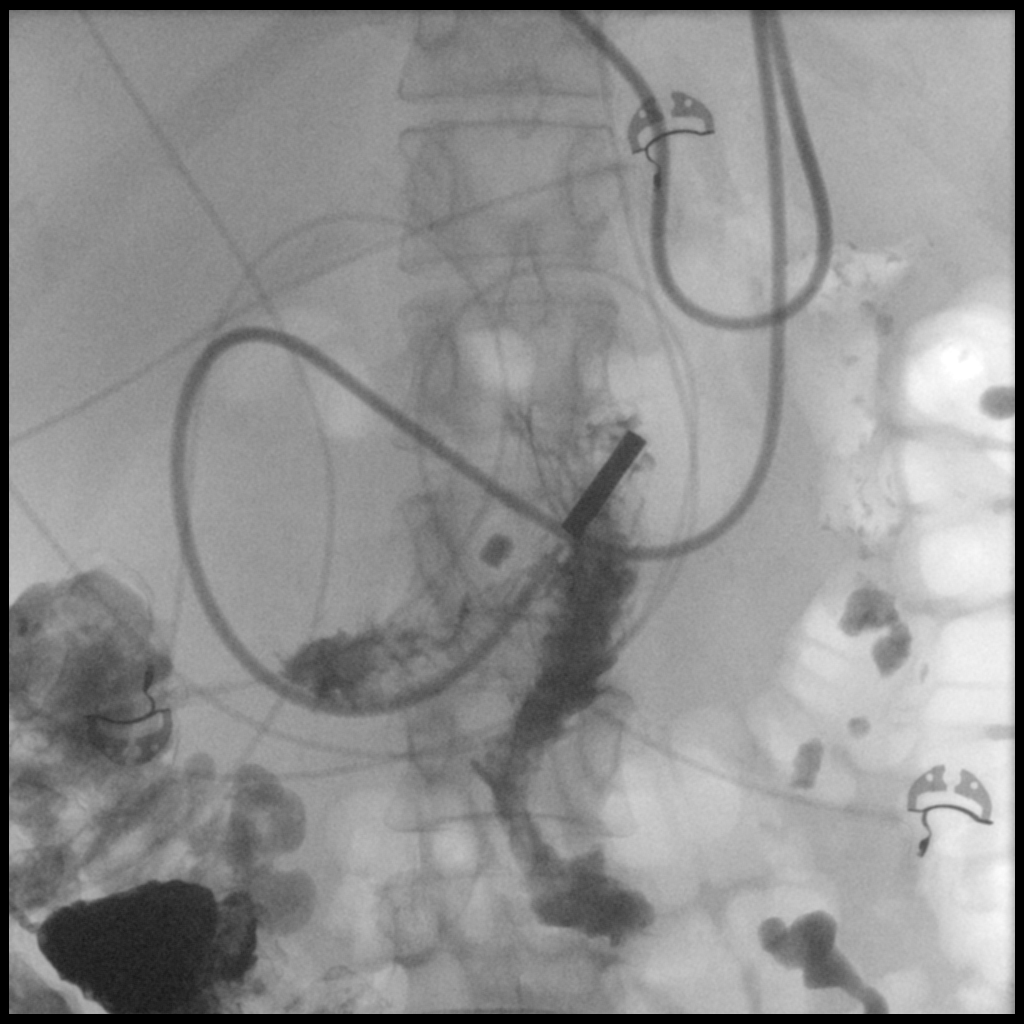

[4 of 4 positions shown; findings below may reference images not displayed]

EXAM:
NASO G TUBE PLACEMENT WITH FL AND WITH RAD

CONTRAST:  15 mL LTK05S-T00 IOPAMIDOL (LTK05S-T00) INJECTION 61%

FLUOROSCOPY TIME:  Fluoroscopy Time:  3 minutes 12 seconds

Radiation Exposure Index (if provided by the fluoroscopic device):
33.9 mGy

Number of Acquired Spot Images: Single cine fluoroscopic series
FINDINGS: Pharynx was anesthetized with Cetacaine spray.

Utilizing lubricant, a feeding tube was passed through the RIGHT
nasal passages in the hypopharynx.

With patient swallowing and neck flexed, feeding tube was advanced
into the thoracic esophagus and down into the stomach.

Catheter was manipulated and placed across the pylorus into the
duodenum.

Catheter was advanced to the level of ligament of Treitz.

Contrast injection confirms presence of the catheter tip at the
ligament of Treitz.
IMPRESSION: Placement of nasogastric feeding tube to the ligament of Treitz.

## 2021-06-24 ENCOUNTER — Other Ambulatory Visit: Payer: Medicaid Other | Admitting: Obstetrics & Gynecology

## 2021-08-11 ENCOUNTER — Other Ambulatory Visit: Payer: Self-pay

## 2021-08-11 ENCOUNTER — Emergency Department (HOSPITAL_COMMUNITY)
Admission: EM | Admit: 2021-08-11 | Discharge: 2021-08-11 | Disposition: A | Discharge status: Home / Self Care | Payer: Medicaid Other | Attending: Emergency Medicine | Admitting: Emergency Medicine

## 2021-08-11 DIAGNOSIS — B0223 Postherpetic polyneuropathy: Secondary | ICD-10-CM | POA: Insufficient documentation

## 2021-08-11 DIAGNOSIS — E1165 Type 2 diabetes mellitus with hyperglycemia: Secondary | ICD-10-CM | POA: Diagnosis not present

## 2021-08-11 DIAGNOSIS — K0510 Chronic gingivitis, plaque induced: Secondary | ICD-10-CM | POA: Insufficient documentation

## 2021-08-11 DIAGNOSIS — F4321 Adjustment disorder with depressed mood: Secondary | ICD-10-CM | POA: Insufficient documentation

## 2021-08-11 DIAGNOSIS — F329 Major depressive disorder, single episode, unspecified: Secondary | ICD-10-CM | POA: Insufficient documentation

## 2021-08-11 DIAGNOSIS — B80 Enterobiasis: Secondary | ICD-10-CM | POA: Insufficient documentation

## 2021-08-11 DIAGNOSIS — R739 Hyperglycemia, unspecified: Secondary | ICD-10-CM | POA: Diagnosis present

## 2021-08-11 DIAGNOSIS — R631 Polydipsia: Secondary | ICD-10-CM | POA: Insufficient documentation

## 2021-08-11 DIAGNOSIS — M79609 Pain in unspecified limb: Secondary | ICD-10-CM | POA: Insufficient documentation

## 2021-08-11 DIAGNOSIS — Z7984 Long term (current) use of oral hypoglycemic drugs: Secondary | ICD-10-CM | POA: Insufficient documentation

## 2021-08-11 DIAGNOSIS — R000 Tachycardia, unspecified: Secondary | ICD-10-CM | POA: Insufficient documentation

## 2021-08-11 DIAGNOSIS — E119 Type 2 diabetes mellitus without complications: Secondary | ICD-10-CM

## 2021-08-11 DIAGNOSIS — R110 Nausea: Secondary | ICD-10-CM | POA: Insufficient documentation

## 2021-08-11 DIAGNOSIS — S92309A Fracture of unspecified metatarsal bone(s), unspecified foot, initial encounter for closed fracture: Secondary | ICD-10-CM | POA: Insufficient documentation

## 2021-08-11 DIAGNOSIS — F4320 Adjustment disorder, unspecified: Secondary | ICD-10-CM | POA: Insufficient documentation

## 2021-08-11 DIAGNOSIS — K863 Pseudocyst of pancreas: Secondary | ICD-10-CM | POA: Insufficient documentation

## 2021-08-11 DIAGNOSIS — B379 Candidiasis, unspecified: Secondary | ICD-10-CM | POA: Insufficient documentation

## 2021-08-11 DIAGNOSIS — B3731 Acute candidiasis of vulva and vagina: Secondary | ICD-10-CM | POA: Insufficient documentation

## 2021-08-11 DIAGNOSIS — M79672 Pain in left foot: Secondary | ICD-10-CM | POA: Insufficient documentation

## 2021-08-11 DIAGNOSIS — Z931 Gastrostomy status: Secondary | ICD-10-CM | POA: Insufficient documentation

## 2021-08-11 DIAGNOSIS — L409 Psoriasis, unspecified: Secondary | ICD-10-CM | POA: Insufficient documentation

## 2021-08-11 DIAGNOSIS — K029 Dental caries, unspecified: Secondary | ICD-10-CM | POA: Insufficient documentation

## 2021-08-11 DIAGNOSIS — K2100 Gastro-esophageal reflux disease with esophagitis, without bleeding: Secondary | ICD-10-CM | POA: Insufficient documentation

## 2021-08-11 DIAGNOSIS — G4700 Insomnia, unspecified: Secondary | ICD-10-CM | POA: Insufficient documentation

## 2021-08-11 LAB — BASIC METABOLIC PANEL
Anion gap: 11 (ref 5–15)
Anion gap: 13 (ref 5–15)
BUN: 8 mg/dL (ref 6–20)
BUN: 8 mg/dL (ref 6–20)
CO2: 23 mmol/L (ref 22–32)
CO2: 24 mmol/L (ref 22–32)
Calcium: 8.7 mg/dL — ABNORMAL LOW (ref 8.9–10.3)
Calcium: 8.7 mg/dL — ABNORMAL LOW (ref 8.9–10.3)
Chloride: 96 mmol/L — ABNORMAL LOW (ref 98–111)
Chloride: 99 mmol/L (ref 98–111)
Creatinine, Ser: 0.57 mg/dL (ref 0.44–1.00)
Creatinine, Ser: 0.59 mg/dL (ref 0.44–1.00)
GFR, Estimated: >60 mL/min (ref >60)
GFR, Estimated: >60 mL/min (ref >60)
Glucose, Bld: 439 mg/dL — ABNORMAL HIGH (ref 70–99)
Glucose, Bld: 442 mg/dL — ABNORMAL HIGH (ref 70–99)
Potassium: 3.2 mmol/L — ABNORMAL LOW (ref 3.5–5.1)
Potassium: 3.2 mmol/L — ABNORMAL LOW (ref 3.5–5.1)
Sodium: 132 mmol/L — ABNORMAL LOW (ref 135–145)
Sodium: 134 mmol/L — ABNORMAL LOW (ref 135–145)

## 2021-08-11 LAB — BLOOD GAS, VENOUS
Acid-Base Excess: 2.4 mmol/L — ABNORMAL HIGH (ref 0.0–2.0)
Bicarbonate: 28.4 mmol/L — ABNORMAL HIGH (ref 20.0–28.0)
Drawn by: 6246
FIO2: 21 %
O2 Saturation: 53.9 %
Patient temperature: 36.6
pCO2, Ven: 47 mmHg (ref 44–60)
pH, Ven: 7.39 (ref 7.25–7.43)
pO2, Ven: 31 mmHg — CL (ref 32–45)

## 2021-08-11 LAB — URINALYSIS, ROUTINE W REFLEX MICROSCOPIC
Bilirubin Urine: NEGATIVE
Glucose, UA: >=500 mg/dL — AB
Ketones, ur: 20 mg/dL — AB
Leukocytes,Ua: NEGATIVE
Nitrite: NEGATIVE
Protein, ur: NEGATIVE mg/dL
Specific Gravity, Urine: 1.033 — ABNORMAL HIGH (ref 1.005–1.030)
pH: 7 (ref 5.0–8.0)

## 2021-08-11 LAB — BETA-HYDROXYBUTYRIC ACID: Beta-Hydroxybutyric Acid: 0.88 mmol/L — ABNORMAL HIGH (ref 0.05–0.27)

## 2021-08-11 LAB — CBG MONITORING, ED
Glucose-Capillary: 464 mg/dL — ABNORMAL HIGH (ref 70–99)
Glucose-Capillary: 433 mg/dL — ABNORMAL HIGH (ref 70–99)
Glucose-Capillary: 323 mg/dL — ABNORMAL HIGH (ref 70–99)
Glucose-Capillary: 221 mg/dL — ABNORMAL HIGH (ref 70–99)
Glucose-Capillary: 175 mg/dL — ABNORMAL HIGH (ref 70–99)

## 2021-08-11 LAB — CBC
HCT: 39.9 % (ref 36.0–46.0)
Hemoglobin: 13.5 g/dL (ref 12.0–15.0)
MCH: 29.7 pg (ref 26.0–34.0)
MCHC: 33.8 g/dL (ref 30.0–36.0)
MCV: 87.9 fL (ref 80.0–100.0)
Platelets: 262 10*3/uL (ref 150–400)
RBC: 4.54 MIL/uL (ref 3.87–5.11)
RDW: 13.4 % (ref 11.5–15.5)
WBC: 6.3 10*3/uL (ref 4.0–10.5)
nRBC: 0 % (ref 0.0–0.2)

## 2021-08-11 LAB — HEPATIC FUNCTION PANEL
ALT: 15 U/L (ref 0–44)
AST: 16 U/L (ref 15–41)
Albumin: 3.8 g/dL (ref 3.5–5.0)
Alkaline Phosphatase: 145 U/L — ABNORMAL HIGH (ref 38–126)
Bilirubin, Direct: <0.1 mg/dL (ref 0.0–0.2)
Total Bilirubin: 0.4 mg/dL (ref 0.3–1.2)
Total Protein: 7.2 g/dL (ref 6.5–8.1)

## 2021-08-11 LAB — LIPASE, BLOOD: Lipase: 31 U/L (ref 11–51)

## 2021-08-11 LAB — POC URINE PREG, ED: Preg Test, Ur: NEGATIVE

## 2021-08-11 MED ORDER — DEXTROSE 50 % IV SOLN: 0.0000 mL | INTRAVENOUS | Status: DC | PRN

## 2021-08-11 MED ORDER — METFORMIN HCL 500 MG PO TABS: 500.0000 mg | ORAL_TABLET | Freq: Two times a day (BID) | ORAL | 0 refills | Status: DC

## 2021-08-11 MED ORDER — DEXTROSE IN LACTATED RINGERS 5 % IV SOLN: INTRAVENOUS | Status: DC

## 2021-08-11 MED ORDER — LACTATED RINGERS IV SOLN: INTRAVENOUS | Status: DC

## 2021-08-11 MED ORDER — INSULIN REGULAR(HUMAN) IN NACL 100-0.9 UT/100ML-% IV SOLN
INTRAVENOUS | Status: DC
  Administered 2021-08-11: 7.5 [IU]/h via INTRAVENOUS
  Filled 2021-08-11: qty 100

## 2021-08-11 MED ORDER — LACTATED RINGERS IV BOLUS
20.0000 mL/kg | Freq: Once | INTRAVENOUS | Status: AC
  Administered 2021-08-11: 1000 mL via INTRAVENOUS

## 2021-08-11 MED ORDER — INSULIN ASPART 100 UNIT/ML IV SOLN: 10.0000 [IU] | Freq: Once | INTRAVENOUS | Status: DC

## 2021-08-11 MED ORDER — SODIUM CHLORIDE 0.9 % IV BOLUS
1000.0000 mL | Freq: Once | INTRAVENOUS | Status: AC
Start: 2021-08-11 — End: 2021-08-11
  Administered 2021-08-11: 1000 mL via INTRAVENOUS

## 2021-08-11 NOTE — ED Notes (Signed)
Pt tolerating oral fluids at this time.  °

## 2021-08-11 NOTE — Progress Notes (Addendum)
Inpatient Diabetes Program Recommendations ? ?AACE/ADA: New Consensus Statement on Inpatient Glycemic Control (2015) ? ?Target Ranges:  Prepandial:   less than 140 mg/dL ?     Peak postprandial:   less than 180 mg/dL (1-2 hours) ?     Critically ill patients:  140 - 180 mg/dL  ? ? Latest Reference Range & Units 08/11/21 12:07 08/11/21 12:46 08/11/21 13:50  ?Glucose-Capillary 70 - 99 mg/dL 681 (H) 275 (H) 170 (H) ? ?IV Insulin Drip Started  ? ? ? ?Admit with: Hyperglycemia ?Per ED MD notes: "16 pound weight loss in the last 3 months which was unintentional, her dry mouth gets worse, she is extensively thirsty and has had significant urinary frequency" ?Seen by PCP 08/10/2021 and Lab results came back with Glucose >1000--Told to go to ED by PCP office ? ?History: Chronic alcoholic pancreatitis  ? ?Current Orders: IV Insulin Drip + IVF ? ? ? ?Looks like new diagnosis of diabetes ? ?Please order Hemoglobin A1c level ? ?IV Insulin to start down in the ED ? ?Will follow ? ? ? ?--Will follow patient during hospitalization-- ? ?Ambrose Finland RN, MSN, CDE ?Diabetes Coordinator ?Inpatient Glycemic Control Team ?Team Pager: [phone] (8a-5p) ? ? ? ?

## 2021-08-11 NOTE — ED Provider Notes (Signed)
[facility] EMERGENCY DEPARTMENT Provider Note   CSN: [id number] Arrival date & time: 08/11/21  1157     History  Chief Complaint  Patient presents with   Hyperglycemia    Monique Nguyen is a 51 y.o. female.   Hyperglycemia  This patient is a 51 year old female who has a history of chronic alcoholic pancreatitis though she has not had a flareup in several years.  She no longer drinks, she went to her doctor's visit yesterday for her usual 48-month checkup and was found to have significant hyperglycemia, they called her this morning with the results and told her that her blood sugar was over thousand.  The patient states she has never had diabetes that she knows of however she reports that she has had a 16 pound weight loss in the last 3 months which was unintentional, her dry mouth gets worse, she is extensively thirsty and has had significant urinary frequency.  She is now becoming weak and lightheaded sometimes when she stands up too quickly.  She denies fevers or chills, she has mild abdominal discomfort and nausea, no diarrhea.  She has never been treated for diabetes in the past though she has had chronic pancreatitis  Home Medications Prior to Admission medications   Medication Sig Start Date End Date Taking? Authorizing Provider  metFORMIN (GLUCOPHAGE) 500 MG tablet Take 1 tablet (500 mg total) by mouth 2 (two) times daily with a meal. 08/11/21 09/10/21 Yes Eber Hong, MD  Amino Acids-Protein Hydrolys (FEEDING SUPPLEMENT, PRO-STAT SUGAR FREE 64,) LIQD Take 30 mLs by mouth 2 (two) times daily. 10/23/18   Sherryll Burger, Pratik D, DO  ferrous sulfate 300 (60 Fe) MG/5ML syrup Place 5 mLs (300 mg total) into feeding tube daily with breakfast for 30 days. 11/08/18 12/08/18  Johnson, Clanford L, MD  imipramine (TOFRANIL) 10 MG tablet Place 1 tablet (10 mg total) into feeding tube at bedtime. 11/07/18   Johnson, Clanford L, MD  pantoprazole sodium (PROTONIX) 40 mg/20 mL PACK Place 20 mLs (40 mg  total) into feeding tube 2 (two) times daily before a meal for 30 days. 11/07/18 12/07/18  Johnson, Clanford L, MD  sertraline (ZOLOFT) 50 MG tablet Place 1 tablet (50 mg total) into feeding tube daily. 11/07/18   Cleora Fleet, MD      Allergies    Doxycycline, Sulfa antibiotics, and Sulfamethoxazole-trimethoprim    Review of Systems   Review of Systems  All other systems reviewed and are negative.  Physical Exam Updated Vital Signs BP 115/89    Pulse (!) 108    Temp 97.9 F (36.6 C) (Oral)    Resp 20    Ht 1.549 m (5\' 1" )    Wt 50 kg    LMP 04/12/2013    SpO2 98%    BMI 20.83 kg/m  Physical Exam Vitals and nursing note reviewed.  Constitutional:      General: She is not in acute distress.    Appearance: She is well-developed.  HENT:     Head: Normocephalic and atraumatic.     Mouth/Throat:     Mouth: Mucous membranes are dry.     Pharynx: No oropharyngeal exudate.  Eyes:     General: No scleral icterus.       Right eye: No discharge.        Left eye: No discharge.     Conjunctiva/sclera: Conjunctivae normal.     Pupils: Pupils are equal, round, and reactive to light.  Neck:  Thyroid: No thyromegaly.     Vascular: No JVD.  Cardiovascular:     Rate and Rhythm: Regular rhythm. Tachycardia present.     Heart sounds: Normal heart sounds. No murmur heard.   No friction rub. No gallop.  Pulmonary:     Effort: Pulmonary effort is normal. No respiratory distress.     Breath sounds: Normal breath sounds. No wheezing or rales.  Abdominal:     General: Bowel sounds are normal. There is no distension.     Palpations: Abdomen is soft. There is no mass.     Tenderness: There is no abdominal tenderness.  Musculoskeletal:        General: No tenderness. Normal range of motion.     Cervical back: Normal range of motion and neck supple.  Lymphadenopathy:     Cervical: No cervical adenopathy.  Skin:    General: Skin is warm and dry.     Findings: No erythema or rash.   Neurological:     General: No focal deficit present.     Mental Status: She is alert.     Coordination: Coordination normal.  Psychiatric:        Behavior: Behavior normal.    ED Results / Procedures / Treatments   Labs (all labs ordered are listed, but only abnormal results are displayed) Labs Reviewed  URINALYSIS, ROUTINE W REFLEX MICROSCOPIC - Abnormal; Notable for the following components:      Result Value   Color, Urine STRAW (*)    Specific Gravity, Urine 1.033 (*)    Glucose, UA >=500 (*)    Hgb urine dipstick SMALL (*)    Ketones, ur 20 (*)    Bacteria, UA RARE (*)    All other components within normal limits  BASIC METABOLIC PANEL - Abnormal; Notable for the following components:   Sodium 134 (*)    Potassium 3.2 (*)    Glucose, Bld 439 (*)    Calcium 8.7 (*)    All other components within normal limits  BASIC METABOLIC PANEL - Abnormal; Notable for the following components:   Sodium 132 (*)    Potassium 3.2 (*)    Chloride 96 (*)    Glucose, Bld 442 (*)    Calcium 8.7 (*)    All other components within normal limits  BETA-HYDROXYBUTYRIC ACID - Abnormal; Notable for the following components:   Beta-Hydroxybutyric Acid 0.88 (*)    All other components within normal limits  BLOOD GAS, VENOUS - Abnormal; Notable for the following components:   pO2, Ven 31 (*)    Bicarbonate 28.4 (*)    Acid-Base Excess 2.4 (*)    All other components within normal limits  HEPATIC FUNCTION PANEL - Abnormal; Notable for the following components:   Alkaline Phosphatase 145 (*)    All other components within normal limits  CBG MONITORING, ED - Abnormal; Notable for the following components:   Glucose-Capillary 464 (*)    All other components within normal limits  CBG MONITORING, ED - Abnormal; Notable for the following components:   Glucose-Capillary 433 (*)    All other components within normal limits  CBG MONITORING, ED - Abnormal; Notable for the following components:    Glucose-Capillary 323 (*)    All other components within normal limits  CBG MONITORING, ED - Abnormal; Notable for the following components:   Glucose-Capillary 221 (*)    All other components within normal limits  CBC  LIPASE, BLOOD  BASIC METABOLIC PANEL  BASIC METABOLIC PANEL  BETA-HYDROXYBUTYRIC ACID  POC URINE PREG, ED    EKG None  Radiology No results found.  Procedures Procedures    Medications Ordered in ED Medications  insulin regular, human (MYXREDLIN) 100 units/ 100 mL infusion (3 Units/hr Intravenous Rate/Dose Change 08/11/21 1453)  lactated ringers infusion (0 mLs Intravenous Stopped 08/11/21 1452)  dextrose 5 % in lactated ringers infusion ( Intravenous New Bag/Given 08/11/21 1452)  dextrose 50 % solution 0-50 mL (has no administration in time range)  insulin aspart (novoLOG) injection 10 Units (0 Units Intravenous Hold 08/11/21 1415)  lactated ringers bolus 1,000 mL (0 mLs Intravenous Stopped 08/11/21 1345)  sodium chloride 0.9 % bolus 1,000 mL (1,000 mLs Intravenous New Bag/Given 08/11/21 1455)    ED Course/ Medical Decision Making/ A&P                           Medical Decision Making Amount and/or Complexity of Data Reviewed Labs: ordered.  Risk OTC drugs. Prescription drug management.   This patient presents to the ED for concern of hyperglycemia, this involves an extensive number of treatment options, and is a complaint that carries with it a high risk of complications and morbidity.  The differential diagnosis includes chronic pancreatitis causing insulin deficiency, new onset diabetes, infection, severe dehydration, renal failure   Co morbidities that complicate the patient evaluation  Chronic pancreatitis, extremely thin and somewhat emaciated   Additional history obtained:  Additional history obtained from electronic medical record External records from outside source obtained and reviewed including prior visits to hospital, she is to have a  feeding tube secondary to her pancreatitis, she no longer has the feeding tube   Lab Tests:  I Ordered, and personally interpreted labs.  The pertinent results include: Hyperglycemia, negative pregnancy, sodium of 134, potassium of 3.2, glucose was initially very high but improved with fluids.  There is no increased anion gap and the CO2 was normal at 24 suggesting that this is not an acidosis.  Furthermore the VBG did not reveal any signs of severe acidosis.  LFTs were normal, lipase is normal, blood counts were normal without leukocytosis or anemia and the urinalysis did reveal glucose with a small amount of ketones however the patient was given 2 L of IV fluid and insulin and had significant improvement    Cardiac Monitoring:  The patient was maintained on a cardiac monitor.  I personally viewed and interpreted the cardiac monitored which showed an underlying rhythm of: Sinus tachycardia which improved to a heart rate of 100 by the time of discharge   Medicines ordered and prescription drug management:  I ordered medication including insulin and insulin drip for hyperglycemia Reevaluation of the patient after these medicines showed that the patient improved I have reviewed the patients home medicines and have made adjustments as needed   Admission considered:  However the patient did not have any hard signs of DKA, she did very well had normal mental status improving heart rate, no nausea and understands her instructions for home, I think she is safe to be discharged to follow-up tomorrow with her primary care doctor and she agrees to this   Critical Interventions:  Treatment of hyperglycemia and rule out of DKA    Social Determinants of Health:  None           Final Clinical Impression(s) / ED Diagnoses Final diagnoses:  Diabetes mellitus, new onset (HCC)    Rx / DC Orders ED Discharge Orders  Ordered    metFORMIN (GLUCOPHAGE) 500 MG tablet  2 times  daily with meals        08/11/21 1542              Eber HongMiller, Teyanna Thielman, MD 08/11/21 1545

## 2021-08-11 NOTE — Discharge Instructions (Signed)
As a new diabetic you may need to have insulin however at this time we want to start you on pills.  They are easier to take and if you eat a healthy diet you may not need to go on insulin just yet.  That being said you have to follow-up with your doctor tomorrow for recheck. ? ?Emergency department for increasing pain weakness fever vomiting coughing shortness of breath or any other worsening symptoms. ?

## 2021-08-11 NOTE — ED Notes (Signed)
Attempted IV access x2. Second nurse attempting at this time.  °

## 2021-08-11 NOTE — ED Triage Notes (Signed)
Pt reports that her blood sugar was > 1000 at her doctors appt yesterday. She reports she is not diabetic. ?

## 2021-08-11 NOTE — ED Notes (Signed)
Insulin drip can be started at this time per EDP.  ?

## 2021-08-26 ENCOUNTER — Other Ambulatory Visit (HOSPITAL_COMMUNITY): Payer: Self-pay | Admitting: Sports Medicine

## 2021-08-30 ENCOUNTER — Encounter: Payer: Self-pay | Admitting: Internal Medicine

## 2021-09-29 ENCOUNTER — Other Ambulatory Visit (HOSPITAL_COMMUNITY): Payer: Self-pay | Admitting: Family Medicine

## 2021-09-29 ENCOUNTER — Other Ambulatory Visit: Payer: Self-pay | Admitting: Family Medicine

## 2021-09-29 DIAGNOSIS — N950 Postmenopausal bleeding: Secondary | ICD-10-CM

## 2021-10-06 ENCOUNTER — Ambulatory Visit: Payer: Medicaid Other | Admitting: Internal Medicine

## 2021-10-06 ENCOUNTER — Encounter: Payer: Self-pay | Admitting: Internal Medicine

## 2022-01-25 ENCOUNTER — Encounter (HOSPITAL_COMMUNITY): Payer: Self-pay | Admitting: Radiology

## 2022-01-25 ENCOUNTER — Emergency Department (HOSPITAL_COMMUNITY)
Admission: EM | Admit: 2022-01-25 | Discharge: 2022-01-25 | Disposition: A | Discharge status: Home / Self Care | Payer: Medicaid Other | Attending: Emergency Medicine | Admitting: Emergency Medicine

## 2022-01-25 ENCOUNTER — Other Ambulatory Visit: Payer: Self-pay

## 2022-01-25 ENCOUNTER — Emergency Department (HOSPITAL_COMMUNITY): Payer: Medicaid Other

## 2022-01-25 DIAGNOSIS — Z7982 Long term (current) use of aspirin: Secondary | ICD-10-CM | POA: Insufficient documentation

## 2022-01-25 DIAGNOSIS — B3731 Acute candidiasis of vulva and vagina: Secondary | ICD-10-CM | POA: Insufficient documentation

## 2022-01-25 DIAGNOSIS — K611 Rectal abscess: Secondary | ICD-10-CM | POA: Insufficient documentation

## 2022-01-25 DIAGNOSIS — L0291 Cutaneous abscess, unspecified: Secondary | ICD-10-CM

## 2022-01-25 DIAGNOSIS — Z7984 Long term (current) use of oral hypoglycemic drugs: Secondary | ICD-10-CM | POA: Insufficient documentation

## 2022-01-25 DIAGNOSIS — E1165 Type 2 diabetes mellitus with hyperglycemia: Secondary | ICD-10-CM | POA: Diagnosis not present

## 2022-01-25 LAB — URINALYSIS, ROUTINE W REFLEX MICROSCOPIC
Bilirubin Urine: NEGATIVE
Glucose, UA: >=500 mg/dL — AB
Hgb urine dipstick: NEGATIVE
Ketones, ur: 20 mg/dL — AB
Leukocytes,Ua: NEGATIVE
Nitrite: NEGATIVE
Protein, ur: 30 mg/dL — AB
Specific Gravity, Urine: 1.036 — ABNORMAL HIGH (ref 1.005–1.030)
pH: 6 (ref 5.0–8.0)

## 2022-01-25 LAB — BASIC METABOLIC PANEL
Anion gap: 11 (ref 5–15)
BUN: 11 mg/dL (ref 6–20)
CO2: 22 mmol/L (ref 22–32)
Calcium: 8.6 mg/dL — ABNORMAL LOW (ref 8.9–10.3)
Chloride: 99 mmol/L (ref 98–111)
Creatinine, Ser: 0.57 mg/dL (ref 0.44–1.00)
GFR, Estimated: >60 mL/min (ref >60)
Glucose, Bld: 402 mg/dL — ABNORMAL HIGH (ref 70–99)
Potassium: 3.6 mmol/L (ref 3.5–5.1)
Sodium: 132 mmol/L — ABNORMAL LOW (ref 135–145)

## 2022-01-25 LAB — CBC
HCT: 34.2 % — ABNORMAL LOW (ref 36.0–46.0)
Hemoglobin: 11.9 g/dL — ABNORMAL LOW (ref 12.0–15.0)
MCH: 28.7 pg (ref 26.0–34.0)
MCHC: 34.8 g/dL (ref 30.0–36.0)
MCV: 82.6 fL (ref 80.0–100.0)
Platelets: 355 10*3/uL (ref 150–400)
RBC: 4.14 MIL/uL (ref 3.87–5.11)
RDW: 12.8 % (ref 11.5–15.5)
WBC: 7.2 10*3/uL (ref 4.0–10.5)
nRBC: 0 % (ref 0.0–0.2)

## 2022-01-25 LAB — PREGNANCY, URINE: Preg Test, Ur: NEGATIVE

## 2022-01-25 LAB — CBG MONITORING, ED
Glucose-Capillary: 439 mg/dL — ABNORMAL HIGH (ref 70–99)
Glucose-Capillary: 268 mg/dL — ABNORMAL HIGH (ref 70–99)

## 2022-01-25 MED ORDER — HYDROMORPHONE HCL 1 MG/ML IJ SOLN
0.5000 mg | Freq: Once | INTRAMUSCULAR | Status: AC
  Administered 2022-01-25: 0.5 mg via INTRAVENOUS
  Filled 2022-01-25: qty 0.5

## 2022-01-25 MED ORDER — CEPHALEXIN 500 MG PO CAPS
500.0000 mg | ORAL_CAPSULE | Freq: Four times a day (QID) | ORAL | 0 refills | Status: AC
Start: 2022-01-25 — End: 2022-02-01

## 2022-01-25 MED ORDER — IOHEXOL 300 MG/ML  SOLN
100.0000 mL | Freq: Once | INTRAMUSCULAR | Status: AC | PRN
  Administered 2022-01-25: 80 mL via INTRAVENOUS

## 2022-01-25 MED ORDER — LIVING WELL WITH DIABETES BOOK
Freq: Once | Status: DC
  Filled 2022-01-25: qty 1

## 2022-01-25 MED ORDER — FLUCONAZOLE 150 MG PO TABS: 150.0000 mg | ORAL_TABLET | Freq: Every day | ORAL | 0 refills | Status: DC

## 2022-01-25 NOTE — Inpatient Diabetes Management (Addendum)
Inpatient Diabetes Program Recommendations  AACE/ADA: New Consensus Statement on Inpatient Glycemic Control   Target Ranges:  Prepandial:   less than 140 mg/dL      Peak postprandial:   less than 180 mg/dL (1-2 hours)      Critically ill patients:  140 - 180 mg/dL    Latest Reference Range & Units 01/25/22 12:09  Glucose-Capillary 70 - 99 mg/dL 932 (H)    Latest Reference Range & Units 08/11/21 12:48  Beta-Hydroxybutyric Acid 0.05 - 0.27 mmol/L 0.88 (H)  Glucose 70 - 99 mg/dL 70 - 99 mg/dL 671 (H) 245 (H)   Review of Glycemic Control  Diabetes history: DM2 Outpatient Diabetes medications: Metformin 500 mg BID (prescribed 08/11/21 at ED discharge) Current orders for Inpatient glycemic control: None; in ED  Inpatient Diabetes Program Recommendations:    HbgA1C: Please consider ordering an A1C to evaluate glycemic control over the past 2-3 months.  Labs: May want to consider ordering c-peptide and GAD antibodies to help determine if pancreas making adequate insulin and type of DM patient has.  Outpatient DM medications: Patient is currently taking Metformin 500 mg BID and has not followed up with PCP since she was started on Metformin on 08/11/21 in the ED.  Patient will need additional DM medication prescribed as an outpatient to get DM under control.  NOTE: In reviewing chart, noted patient presented to [facility] ED on 08/11/21 for hyperglycemia (per note by Dr. Hyacinth Meeker) "she went to her doctor's visit yesterday for her usual 50-month checkup and was found to have significant hyperglycemia, they called her this morning with the results and told her that her blood sugar was over thousand.  The patient states she has never had diabetes that she knows of however she reports that she has had a 16 pound weight loss in the last 3 months which was unintentional, her dry mouth gets worse, she is extensively thirsty and has had significant urinary frequency." Patient was prescribed Metformin 500 mg BID  on 08/11/21 and discharged from ED. Patient currently in ED today for anal abscess and concerned about DM.  Will order Living Well with DM book.  Addendum 01/25/22@14 :00-Spoke with patient in VT1 area. Patient sitting up in chair waiting to be taken back to ER room when available. Patient states that she came in due to painful abscess on buttocks. Patient confirms that she was dx with DM in March in the ED and she states that she has been taking the Metformin 500 mg BID since then. Patient has not followed up with her PCP since she was diagnosed with DM on 08/11/21. Patient reports that she has a glucometer and testing supplies but she has not checked glucose since May. She reports that when she was checking her glucose it was "getting better". When asked specifically what values she was seeing on her glucometer, she states that it was 200-400's mg/dl. Informed patient that a Living Well with DM book was ordered for her and encouraged her to read through entire book once received. Patient confirms that she has continued to have symptoms of hyperglycemia (excessive thirst, frequent urination, blurry vision) since she was dx with DM in March. 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 Patient's boy friend reports that she had a stent placed in the pancreas at that time as well.  Discussed basic pathophysiology of DM, basic home care, importance of checking CBGs and maintaining good CBG control to prevent long-term and short-term complications. Patient is not aware of what an A1C is.  No prior A1C results in chart. Explained what an A1C is. Reviewed glucose and A1C goals. Informed patient that it would be requested that A1C be ordered. Patient states that she drinks regular sodas and is not able to afford to buy healthy foods; eating a lot of pasta, potatoes,  bread. Discussed carb modified diet and encouraged patient to follow carb modified diet and eliminate sugary drinks completely.  Discussed that she will most likely need additional DM medication to get DM under control. Stressed to patient to start checking glucose at home, take DM medications as prescribed, and following up with PCP in the near future. Encouraged patient to ask PCP about referral to Endocrinologist to assist with DM management.   Patient verbalized understanding of information discussed and she states that she has no further questions at this time related to diabetes.      Thanks, Orlando Penner, RN, MSN, CDCES Diabetes Coordinator Inpatient Diabetes Program [phone] (Team Pager from 8am to 5pm)

## 2022-01-25 NOTE — ED Provider Notes (Signed)
[facility] EMERGENCY DEPARTMENT Provider Note   CSN: [id number] Arrival date & time: 01/25/22  1130     History  Chief Complaint  Patient presents with  . Abscess  . Hyperglycemia    Monique Nguyen is a 51 y.o. female with a past medical history of diabetes who presents to the emergency department with concerns for abscess onset 1 week ago.  She notes it has been draining for the past 3-4 days.  She has been using warm compresses to the area.  Patient has associated dysuria and nausea.  Also has concerns for vaginal yeast infection has been ongoing intermittently for the past month.  Has a history of diabetes and notes that her sugar levels at home have been in the 400s.  She takes metformin and has been compliant with her medications.  Has a follow-up appointment with her primary care provider on 02/07/2022.  Denies fever, hematuria, vomiting.    The history is provided by the patient. No language interpreter was used.       Home Medications Prior to Admission medications   Medication Sig Start Date End Date Taking? Authorizing Provider  aspirin-acetaminophen-caffeine (EXCEDRIN MIGRAINE) [phone] MG tablet Take 2 tablets by mouth every 6 (six) hours as needed for headache.   Yes [provider]  atorvastatin (LIPITOR) 20 MG tablet Take 20 mg by mouth daily. 11/15/21  Yes [provider]  cephALEXin (KEFLEX) 500 MG capsule Take 1 capsule (500 mg total) by mouth 4 (four) times daily for 7 days. 01/25/22 02/01/22 Yes Jusiah Aguayo A, PA-C  fluconazole (DIFLUCAN) 150 MG tablet Take 1 tablet (150 mg total) by mouth daily. 01/25/22  Yes Jenessa Gillingham A, PA-C  hydrOXYzine (ATARAX) 25 MG tablet Take 25 mg by mouth every 8 (eight) hours as needed. 01/05/22  Yes [provider]  imipramine (TOFRANIL) 10 MG tablet Place 1 tablet (10 mg total) into feeding tube at bedtime. 11/07/18  Yes Johnson, Clanford L, MD  metFORMIN (GLUCOPHAGE) 850 MG tablet Take 850 mg by mouth  2 (two) times daily. 01/05/22  Yes [provider]  pantoprazole (PROTONIX) 20 MG tablet Take 20 mg by mouth daily. 01/18/22  Yes [provider]  sertraline (ZOLOFT) 100 MG tablet Take 100 mg by mouth daily. 01/18/22  Yes [provider]  metFORMIN (GLUCOPHAGE) 500 MG tablet Take 1 tablet (500 mg total) by mouth 2 (two) times daily with a meal. Patient not taking: Reported on 01/25/2022 08/11/21 09/10/21  Eber Hong, MD      Allergies    Doxycycline, Sulfa antibiotics, and Sulfamethoxazole-trimethoprim    Review of Systems   Review of Systems  Constitutional:  Negative for fever.  Gastrointestinal:  Positive for nausea. Negative for vomiting.  Genitourinary:  Positive for dysuria. Negative for hematuria.  All other systems reviewed and are negative.   Physical Exam Updated Vital Signs BP 122/79   Pulse (!) 104   Temp 98 F (36.7 C)   Resp 18   Ht 5\' 1"  (1.549 m)   Wt 46.3 kg   LMP 04/12/2013   SpO2 95%   BMI 19.27 kg/m  Physical Exam Vitals and nursing note reviewed. Exam conducted with a chaperone present.  Constitutional:      General: She is not in acute distress.    Appearance: Normal appearance. She is not ill-appearing, toxic-appearing or diaphoretic.  HENT:     Head: Normocephalic and atraumatic.     Right Ear: External ear normal.  Left Ear: External ear normal.     Mouth/Throat:     Pharynx: No oropharyngeal exudate.  Eyes:     General: No scleral icterus.    Extraocular Movements: Extraocular movements intact.     Conjunctiva/sclera: Conjunctivae normal.  Cardiovascular:     Rate and Rhythm: Normal rate and regular rhythm.     Pulses: Normal pulses.     Heart sounds: Normal heart sounds.  Pulmonary:     Effort: Pulmonary effort is normal. No respiratory distress.     Breath sounds: Normal breath sounds. No wheezing.  Abdominal:     General: Abdomen is flat. Bowel sounds are normal. There is no distension.     Palpations:  Abdomen is soft. There is no mass.     Tenderness: There is no abdominal tenderness. There is no guarding or rebound.     Hernia: There is no hernia in the left inguinal area or right inguinal area.  Genitourinary:    Pubic Area: No rash.      Labia:        Right: Tenderness present. No rash, lesion or injury.        Left: Tenderness present. No rash, lesion or injury.      Vagina: No signs of injury and foreign body. No vaginal discharge, erythema, tenderness or bleeding.     Cervix: Normal.     Uterus: Normal. Not deviated, not enlarged, not fixed and not tender.      Adnexa: Right adnexa normal and left adnexa normal.     Comments: RN chaperone present for exam.  Yeast noted to vulvovaginal region.  Draining area of fluctuance noted to left gluteal fold with surrounding induration up to the rectum. Musculoskeletal:        General: Normal range of motion.     Cervical back: Normal range of motion and neck supple.  Lymphadenopathy:     Lower Body: No right inguinal adenopathy. No left inguinal adenopathy.  Skin:    General: Skin is warm and dry.  Neurological:     Mental Status: She is alert.  Psychiatric:        Behavior: Behavior normal.    ED Results / Procedures / Treatments   Labs (all labs ordered are listed, but only abnormal results are displayed) Labs Reviewed  BASIC METABOLIC PANEL - Abnormal; Notable for the following components:      Result Value   Sodium 132 (*)    Glucose, Bld 402 (*)    Calcium 8.6 (*)    All other components within normal limits  CBC - Abnormal; Notable for the following components:   Hemoglobin 11.9 (*)    HCT 34.2 (*)    All other components within normal limits  URINALYSIS, ROUTINE W REFLEX MICROSCOPIC - Abnormal; Notable for the following components:   Specific Gravity, Urine 1.036 (*)    Glucose, UA >=500 (*)    Ketones, ur 20 (*)    Protein, ur 30 (*)    Bacteria, UA RARE (*)    All other components within normal limits  CBG  MONITORING, ED - Abnormal; Notable for the following components:   Glucose-Capillary 439 (*)    All other components within normal limits  CBG MONITORING, ED - Abnormal; Notable for the following components:   Glucose-Capillary 268 (*)    All other components within normal limits  PREGNANCY, URINE  POC URINE PREG, ED  CBG MONITORING, ED    EKG EKG Interpretation  Date/Time:  Tuesday  January 25 2022 12:08:03 EDT Ventricular Rate:  121 PR Interval:  158 QRS Duration: 66 QT Interval:  316 QTC Calculation: 448 R Axis:   70 Text Interpretation: Sinus tachycardia Right atrial enlargement Borderline ECG When compared with ECG of 12-Nov-2018 18:03, No significant change was found since last tracing no significant change Confirmed by Mancel Bale [phone]) on 01/25/2022 2:16:09 PM  Radiology CT ABDOMEN PELVIS W CONTRAST  Result Date: 01/25/2022 CLINICAL DATA:  Perirectal abscess EXAM: CT ABDOMEN AND PELVIS WITH CONTRAST TECHNIQUE: Multidetector CT imaging of the abdomen and pelvis was performed using the standard protocol following bolus administration of intravenous contrast. RADIATION DOSE REDUCTION: This exam was performed according to the departmental dose-optimization program which includes automated exposure control, adjustment of the mA and/or kV according to patient size and/or use of iterative reconstruction technique. CONTRAST:  79mL OMNIPAQUE IOHEXOL 300 MG/ML  SOLN COMPARISON:  CT abdomen and pelvis dated Nov 11, 2018 FINDINGS: Lower chest: No acute abnormality. Hepatobiliary: No focal liver abnormality is seen. No gallstones, gallbladder wall thickening, or biliary dilatation. Pancreas: Calcifications of the pancreatic head and pancreatic tail atrophy, likely sequela of chronic pancreatitis. Spleen: Normal in size without focal abnormality. Adrenals/Urinary Tract: Bilateral adrenal glands are unremarkable. No hydronephrosis nephrolithiasis. No suspicious renal lesions. Mild bladder wall  thickening. Stomach/Bowel: Stomach is within normal limits. No evidence of bowel wall thickening, distention, or inflammatory changes. 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 Mild atherosclerotic disease of the thoracic aorta. Prominent subcentimeter mesenteric lymph nodes of the upper abdomen, reference node measuring 9 mm in short axis on series 2, image 32, unchanged when compared with 2020 prior and likely reactive. New mildly enlarged left inguinal lymph nodes, reference node measuring 1.0 cm in short axis on series 2, image 82. Reproductive: Uterus and bilateral adnexa are unremarkable. Other: Soft tissue stranding of the left perirectal region with small area of focal low attenuation measuring 1.2 x 0.8 cm on series 2, image 89. No abdominopelvic ascites. Musculoskeletal: No acute or significant osseous findings. IMPRESSION: 1. Soft tissue stranding of the left perirectal region with small area of focal low-attenuation which may be a small abscess or due to phlegmon. 2. Mildly enlarged left inguinal lymph nodes, likely reactive. 3. Mild bladder wall thickening, finding can be seen setting of cystitis. Correlate with urinalysis. 4.  Aortic Atherosclerosis (ICD10-I70.0). Electronically Signed   By: Allegra Lai M.D.   On: 01/25/2022 18:13    Procedures Procedures    Medications Ordered in ED Medications  living well with diabetes book MISC ( Does not apply Not Given 01/25/22 1409)  HYDROmorphone (DILAUDID) injection 0.5 mg (0.5 mg Intravenous Given 01/25/22 1732)  iohexol (OMNIPAQUE) 300 MG/ML solution 100 mL (80 mLs Intravenous Contrast Given 01/25/22 1753)    ED Course/ Medical Decision Making/ A&P Clinical Course as of 01/25/22 2232  Tue Jan 25, 2022  1822 Patient reevaluated and noted improvement of symptoms with treatment regimen in the ED. [SB]    Clinical Course User Index [SB] Luberta Grabinski A,  PA-C                           Medical Decision Making Amount and/or Complexity of Data Reviewed Labs: ordered. Radiology: ordered.  Risk Prescription drug management.   Pt presents with abscess onset 1 week ago.  Has been draining for the past 3-4 days.  Has a history of diabetes and  has been compliant with her metformin.  Notes a history of similar symptoms that she has had to have drained in the emergency department.  Patient afebrile.  On exam patient with no acute cardiovascular, respiratory, down exam findings.  RN chaperone present for GU exam.  Yeast noted to vulvovaginal region.  Draining area of fluctuance noted to left gluteal fold with surrounding induration up to rectum with overlying erythema.  Differential diagnosis includes perirectal abscess, abscess, cellulitis, candidiasis, hyperglycemia, DKA.    Co morbidities that complicate the patient evaluation: Diabetes  Labs:  I ordered, and personally interpreted labs.  The pertinent results include:   Pregnancy urine negative. Urinalysis notable for moderate amount of glucose as well as ketones. Initial CBG elevated at 439, repeat CBG decreased at 268   Imaging: I ordered imaging studies including CT abdomen pelvis I independently visualized and interpreted imaging which showed:  Soft tissue stranding of the left perirectal region with small  area of focal low-attenuation which may be a small abscess or due to  phlegmon.  2. Mildly enlarged left inguinal lymph nodes, likely reactive.  3. Mild bladder wall thickening, finding can be seen setting of  cystitis. Correlate with urinalysis.   I agree with the radiologist interpretation  Medications:  I ordered medication including Dilaudid for symptom management Reevaluation of the patient after these medicines and interventions, I reevaluated the patient and found that they have improved I have reviewed the patients home medicines and have made adjustments as  needed   Disposition: Presentation suspicious for abscess as well as candidal yeast infection of the vulvovaginal region.  Also concern for hyperglycemia.  Doubt DKA at this time.  Doubt cellulitis at this time.  After consideration of the diagnostic results and the patients response to treatment, I feel that the patient would benefit from Discharge home.  Patient will be discharged home with a prescription for Keflex as well as Diflucan.  In-depth conversation held with patient at bedside regarding nutrition choices as pertains to her diabetes.  Also discussed with patient importance of maintaining follow-up with her primary care provider regarding today's ED visit.  Supportive care measures and strict return precautions discussed with patient at bedside. Pt acknowledges and verbalizes understanding. Pt appears safe for discharge. Follow up as indicated in discharge paperwork.    This chart was dictated using voice recognition software, Dragon. Despite the best efforts of this provider to proofread and correct errors, errors may still occur which can change documentation meaning.   Final Clinical Impression(s) / ED Diagnoses Final diagnoses:  Abscess  Yeast infection involving the vagina and surrounding area    Rx / DC Orders ED Discharge Orders          Ordered    cephALEXin (KEFLEX) 500 MG capsule  4 times daily        01/25/22 2020    fluconazole (DIFLUCAN) 150 MG tablet  Daily        01/25/22 2020              Meloney Feld A, PA-C 01/25/22 2232    Bethann Berkshire, MD 01/26/22 1329

## 2022-01-25 NOTE — Discharge Instructions (Addendum)
It was a pleasure taking care of you today!   Your labs overall looked well.  You did have elevated sugar level when he first arrived however it decreased to the 200 range.  It is very important that you continue to take your prescription metformin as prescribed.  Additionally is important that you set up a follow-up appointment with your primary care provider for this week regarding today's ED visit.  Attached is information on diabetes and nutrition.  You will be sent a prescription for Keflex as well as Diflucan.  Take the medications as prescribed.  Return to the ED if you are experiencing increasing/worsening nausea, vomiting, fever, redness spreading, worsening symptoms.

## 2022-01-25 NOTE — ED Triage Notes (Addendum)
Patient reports abscess to anal area for the past week. States that pain is worse over the past two days. Concerned that she has recurring yeast infection. States that heart has been beating fast for the past few days and she is concerned that her DM is not controlled and she wants that checked well. Patient then states that she has been having chest pain for the past few days. Became tachycardic in triage.CBG 439 in triage.

## 2022-01-25 NOTE — ED Provider Triage Note (Signed)
Emergency Medicine Provider Triage Evaluation Note  Monique Nguyen , a 51 y.o. female  was evaluated in triage.  Pt complains of elevated blood sugar.  Pt also has an abscess down belos   Review of Systems  Positive: pain Negative: No fever  Physical Exam  BP (!) 138/96   Pulse (!) 122   Temp 98.7 F (37.1 C) (Oral)   Resp 18   Ht 5\' 1"  (1.549 m)   Wt 46.3 kg   LMP 04/12/2013   SpO2 99%   BMI 19.27 kg/m  Gen:   Awake, no distress   Resp:  Normal effort  MSK:   Moves extremities without difficulty  Other:    Medical Decision Making  Medically screening exam initiated at 12:38 PM.  Appropriate orders placed.  Monique Nguyen was informed that the remainder of the evaluation will be completed by another provider, this initial triage assessment does not replace that evaluation, and the importance of remaining in the ED until their evaluation is complete.     Annamaria Boots, Elson Areas 01/25/22 1239

## 2022-08-19 NOTE — Progress Notes (Signed)
 Pt's friend in room with patient. We discussed hand hygiene before and after dressing change and going from cleaning a dirty wound to repacking the wound after being cleaned. Friend watched the nurse perform wound care yesterday. I watched the patients' friend perform the wound care today. He done very well. Pt tolerated well. Pt has enough supplies. Will return to the wound center on March 12th to be see by Dr. Maranda. [facility].

## 2022-08-22 ENCOUNTER — Encounter: Payer: Self-pay | Admitting: *Deleted

## 2022-08-23 NOTE — Progress Notes (Signed)
 ------------------------------------------------------------------------------- Summary: Perianal abscess -------------------------------------------------------------------------------  Monique Nguyen 52 y.o. 12/06/1970 Phone: [phone] (home)  Address: [address]  [place] [address]  MRN: [id number] Primary MD : Alyce Rodgers [place]  [facility]     Problem List Items Addressed This Visit       Other   Perianal abscess    Improved after incision and drainage No further packing required Complete course of antibiotics previously prescribed Sitz bath's 3 times daily/as needed bowel movement 6-week follow-up      Other Visit Diagnoses     Cellulitis and abscess of buttock          Wound Consultation Note  Past Medical History:  Diagnosis Date  . Diabetes mellitus (CMS-HCC)   . Pancreatitis   . Psoriasis    Past Surgical History:  Procedure Laterality Date  . PR UPGI ENDOSCOPY W/US  FN BX N/A 01/06/2016   Procedure: UGI W/ TRANSENDOSCOPIC ULTRASOUND GUIDED INTRAMURAL/TRANSMURAL FINE NEEDLE ASPIRATION/BIOPSY(S), ESOPHAGUS;  Surgeon: Krystal Jona Matter, MD;  [facility];  Service: Gastroenterology  . PR UPPER GI ENDOSCOPY,DRAIN PSEUDOCYST N/A 11/15/2018   Procedure: UGI; W/TRANSMURAL DRAIN PSEUDOCYST;  Surgeon: Krystal Jona Matter, MD;  [facility]: [facility];  Service: Gastroenterology   Allergies  Allergen Reactions  . Doxycycline  Headache  . Sulfa  (Sulfonamide Antibiotics) Other (See Comments)    Headaches  . Sulfamethoxazole -Trimethoprim  Other (See Comments)    Meds:  Current Outpatient Medications:  .  ALPRAZolam (XANAX) 1 MG tablet, Take by mouth two (2) times a day as needed., Disp: , Rfl:  .  amy-cell-lipas-malt-prt-lac-in (DIGESTIVE ENZYMES,MAL,LAC,INV,) 220 mg cap, Take 1 tablet by mouth., Disp: , Rfl:  .  clindamycin  (CLEOCIN ) 300 MG capsule, Take 1 capsule (300 mg total)  by mouth every six (6) hours for 10 days., Disp: 40 capsule, Rfl: 0 .  clobetasol (TEMOVATE) 0.05 % external solution, apply topically to affected area twice a day, Disp: 50 mL, Rfl: 0 .  cyanocobalamin 1000 MCG tablet, Take 500 mcg by mouth. , Disp: , Rfl:  .  estradiol (ESTRACE) 0.5 MG tablet, Take 0.5 mg by mouth., Disp: , Rfl:  .  HYDROcodone -acetaminophen  (NORCO) 5-325 mg per tablet, Take by mouth., Disp: , Rfl:  .  hyoscyamine (LEVSIN) 0.125 mg tablet, Take 1 tablet (0.125 mg total) by mouth every six (6) hours as needed for cramping., Disp: 120 tablet, Rfl: 0 .  iron-vitamin C 65 mg iron- 125 mg TbEC, Take 65 mg by mouth once daily., Disp: , Rfl:  .  OXYCODONE  HCL (OXYCODONE , BULK, MISC), by Miscellaneous route., Disp: , Rfl:  .  promethazine  (PHENERGAN ) 25 MG tablet, Take 1 tablet (25 mg total) by mouth every six (6) hours as needed for nausea. for up to 14 doses, Disp: 30 tablet, Rfl: 0 .  traMADol  (ULTRAM ) 50 mg tablet, Take 1 tablet (50 mg total) by mouth every six (6) hours as needed for pain., Disp: 10 tablet, Rfl: 0 .  triamcinolone (KENALOG) 0.1 % ointment, Apply twice a day to affected areas, Disp: 454 g, Rfl: 2 .  venlafaxine (EFFEXOR) 100 MG tablet, Take 100 mg by mouth Two (2) times a day., Disp: , Rfl:   SocHx:  reports that she quit smoking about a year ago. Her smoking use included cigarettes. She has a 14 pack-year smoking history. She has never used smokeless tobacco. She reports that she does not drink alcohol .  FamHx: has no family status information on file.  Review of Systems  Constitutional:  Negative for chills, fever, malaise/fatigue and weight loss.  HENT:  Negative for congestion and sore throat.   Eyes:  Negative for double vision, photophobia and redness.  Respiratory:  Negative for cough, hemoptysis, shortness of breath, wheezing and stridor.   Cardiovascular:  Negative for chest pain, palpitations, orthopnea, claudication and leg swelling.   Gastrointestinal:  Negative for abdominal pain, blood in stool, constipation, diarrhea, melena, nausea and vomiting.  Genitourinary:  Negative for frequency, hematuria and urgency.  Musculoskeletal:  Negative for joint pain.  Skin:  Positive for rash (Psoriasis). Negative for itching.  Neurological:  Negative for dizziness, tremors, focal weakness, seizures, loss of consciousness, weakness and headaches.  Endo/Heme/Allergies:  Negative for polydipsia. Does not bruise/bleed easily.  Psychiatric/Behavioral:  Negative for memory loss and suicidal ideas. The patient does not have insomnia.     Special Needs: No special needs identified History of Present Illness: Reason for Consult: Wound Consult  Monique Nguyen is a very unfortunate 52 y.o. year old female who is seen in consultation for evaluation of  peri anal abscess Duration: 2 weeks Location: peri-anal Severity: moderate Aggravating Factors: Diabetes Culture Data: none Vascular studies: n/a Imaging: n/a Previous treatment: I&D via ER  BP 103/69 (BP Position: Sitting)   Pulse 102   Temp 36.1 C (97 F) (Temporal)   Resp 20   SpO2 99% A  Physical Exam Vitals reviewed.  Constitutional:      General: She is not in acute distress.    Appearance: She is normal weight. She is not diaphoretic.  HENT:     Head: Normocephalic and atraumatic.     Mouth/Throat:     Pharynx: No oropharyngeal exudate.  Eyes:     General: No scleral icterus.       Right eye: No discharge.        Left eye: No discharge.     Conjunctiva/sclera: Conjunctivae normal.     Pupils: Pupils are equal, round, and reactive to light.  Neck:     Thyroid : No thyromegaly.     Vascular: No JVD.     Trachea: No tracheal deviation.  Cardiovascular:     Rate and Rhythm: Normal rate and regular rhythm.     Heart sounds: Normal heart sounds. No murmur heard.    No friction rub. No gallop.  Pulmonary:     Effort: Pulmonary effort is normal. No  respiratory distress.     Breath sounds: Normal breath sounds. No stridor. No wheezing or rales.  Chest:     Chest wall: No tenderness.  Abdominal:     General: Bowel sounds are normal. There is no distension.     Palpations: Abdomen is soft. There is no mass.     Tenderness: There is no abdominal tenderness. There is no guarding.  Genitourinary:      Comments: Small perianal incision At 5:00 in reference to lithotomy position No purulent fluid No induration No erythema Minimal tenderness Abscess resolved Musculoskeletal:        General: Deformity present. No tenderness.     Cervical back: Normal range of motion and neck supple.  Lymphadenopathy:     Cervical: No cervical adenopathy.  Skin:    General: Skin is warm and dry.     Coloration: Skin is not pale.     Findings: Rash present. No erythema. Rash is crusting and scaling.     Comments: Psoriasis  Neurological:     Mental Status: She is alert  and oriented to person, place, and time.     Cranial Nerves: No cranial nerve deficit.     Motor: No abnormal muscle tone.     Coordination: Coordination normal.     Gait: Gait is intact.  Psychiatric:        Mood and Affect: Mood and affect normal.        Cognition and Memory: Memory normal.        Judgment: Judgment normal.     IMAGES:

## 2022-09-05 ENCOUNTER — Other Ambulatory Visit: Payer: Self-pay | Admitting: *Deleted

## 2022-09-05 NOTE — Progress Notes (Unsigned)
This encounter was created in error - please disregard.

## 2022-09-07 ENCOUNTER — Encounter: Payer: Self-pay | Admitting: Internal Medicine

## 2022-09-07 ENCOUNTER — Ambulatory Visit: Payer: Medicaid Other | Admitting: Internal Medicine

## 2022-09-07 ENCOUNTER — Other Ambulatory Visit (HOSPITAL_COMMUNITY): Payer: Self-pay | Admitting: Family Medicine

## 2022-09-07 DIAGNOSIS — Z1231 Encounter for screening mammogram for malignant neoplasm of breast: Secondary | ICD-10-CM

## 2022-09-08 ENCOUNTER — Ambulatory Visit: Payer: Medicaid Other | Admitting: General Surgery

## 2024-02-10 ENCOUNTER — Encounter (HOSPITAL_COMMUNITY): Payer: Self-pay

## 2024-02-10 ENCOUNTER — Other Ambulatory Visit: Payer: Self-pay

## 2024-02-10 ENCOUNTER — Emergency Department (HOSPITAL_COMMUNITY)

## 2024-02-10 ENCOUNTER — Inpatient Hospital Stay (HOSPITAL_COMMUNITY)
Admission: EM | Admit: 2024-02-10 | Discharge: 2024-02-15 | DRG: 641 | Disposition: A | Discharge status: Home / Self Care | Attending: Family Medicine | Admitting: Family Medicine

## 2024-02-10 DIAGNOSIS — F410 Panic disorder [episodic paroxysmal anxiety] without agoraphobia: Secondary | ICD-10-CM | POA: Diagnosis present

## 2024-02-10 DIAGNOSIS — K863 Pseudocyst of pancreas: Secondary | ICD-10-CM | POA: Diagnosis present

## 2024-02-10 DIAGNOSIS — R339 Retention of urine, unspecified: Secondary | ICD-10-CM

## 2024-02-10 DIAGNOSIS — E876 Hypokalemia: Secondary | ICD-10-CM | POA: Diagnosis not present

## 2024-02-10 DIAGNOSIS — F17210 Nicotine dependence, cigarettes, uncomplicated: Secondary | ICD-10-CM | POA: Diagnosis present

## 2024-02-10 DIAGNOSIS — K861 Other chronic pancreatitis: Secondary | ICD-10-CM | POA: Diagnosis present

## 2024-02-10 DIAGNOSIS — R000 Tachycardia, unspecified: Secondary | ICD-10-CM | POA: Diagnosis present

## 2024-02-10 DIAGNOSIS — Z800 Family history of malignant neoplasm of digestive organs: Secondary | ICD-10-CM

## 2024-02-10 DIAGNOSIS — I10 Essential (primary) hypertension: Secondary | ICD-10-CM | POA: Diagnosis present

## 2024-02-10 DIAGNOSIS — E119 Type 2 diabetes mellitus without complications: Secondary | ICD-10-CM | POA: Diagnosis present

## 2024-02-10 DIAGNOSIS — Z79899 Other long term (current) drug therapy: Secondary | ICD-10-CM

## 2024-02-10 DIAGNOSIS — K5900 Constipation, unspecified: Secondary | ICD-10-CM | POA: Diagnosis present

## 2024-02-10 DIAGNOSIS — N1330 Unspecified hydronephrosis: Secondary | ICD-10-CM | POA: Diagnosis present

## 2024-02-10 DIAGNOSIS — G8929 Other chronic pain: Secondary | ICD-10-CM | POA: Diagnosis present

## 2024-02-10 DIAGNOSIS — K219 Gastro-esophageal reflux disease without esophagitis: Secondary | ICD-10-CM | POA: Diagnosis present

## 2024-02-10 DIAGNOSIS — Z882 Allergy status to sulfonamides status: Secondary | ICD-10-CM

## 2024-02-10 DIAGNOSIS — E860 Dehydration: Secondary | ICD-10-CM | POA: Diagnosis present

## 2024-02-10 DIAGNOSIS — K860 Alcohol-induced chronic pancreatitis: Secondary | ICD-10-CM | POA: Diagnosis present

## 2024-02-10 DIAGNOSIS — E785 Hyperlipidemia, unspecified: Secondary | ICD-10-CM | POA: Diagnosis present

## 2024-02-10 DIAGNOSIS — Z881 Allergy status to other antibiotic agents status: Secondary | ICD-10-CM

## 2024-02-10 DIAGNOSIS — Z818 Family history of other mental and behavioral disorders: Secondary | ICD-10-CM

## 2024-02-10 DIAGNOSIS — Z7984 Long term (current) use of oral hypoglycemic drugs: Secondary | ICD-10-CM

## 2024-02-10 DIAGNOSIS — F32A Depression, unspecified: Secondary | ICD-10-CM | POA: Diagnosis present

## 2024-02-10 DIAGNOSIS — D638 Anemia in other chronic diseases classified elsewhere: Secondary | ICD-10-CM | POA: Diagnosis present

## 2024-02-10 DIAGNOSIS — F1290 Cannabis use, unspecified, uncomplicated: Secondary | ICD-10-CM | POA: Diagnosis present

## 2024-02-10 DIAGNOSIS — R627 Adult failure to thrive: Secondary | ICD-10-CM | POA: Diagnosis present

## 2024-02-10 LAB — COMPREHENSIVE METABOLIC PANEL WITH GFR
ALT: 14 U/L (ref 0–44)
AST: 22 U/L (ref 15–41)
Albumin: 4.3 g/dL (ref 3.5–5.0)
Alkaline Phosphatase: 59 U/L (ref 38–126)
Anion gap: 16 — ABNORMAL HIGH (ref 5–15)
BUN: 14 mg/dL (ref 6–20)
CO2: 28 mmol/L (ref 22–32)
Calcium: 9.1 mg/dL (ref 8.9–10.3)
Chloride: 99 mmol/L (ref 98–111)
Creatinine, Ser: 0.97 mg/dL (ref 0.44–1.00)
GFR, Estimated: >60 mL/min (ref >60)
Glucose, Bld: 141 mg/dL — ABNORMAL HIGH (ref 70–99)
Potassium: 2.1 mmol/L — CL (ref 3.5–5.1)
Sodium: 143 mmol/L (ref 135–145)
Total Bilirubin: 0.7 mg/dL (ref 0.0–1.2)
Total Protein: 7.6 g/dL (ref 6.5–8.1)

## 2024-02-10 LAB — POTASSIUM: Potassium: 2.7 mmol/L — CL (ref 3.5–5.1)

## 2024-02-10 LAB — CBC WITH DIFFERENTIAL/PLATELET
Abs Immature Granulocytes: 0.01 K/uL (ref 0.00–0.07)
Basophils Absolute: 0 K/uL (ref 0.0–0.1)
Basophils Relative: 1 %
Eosinophils Absolute: 0.1 K/uL (ref 0.0–0.5)
Eosinophils Relative: 3 %
HCT: 29.8 % — ABNORMAL LOW (ref 36.0–46.0)
Hemoglobin: 9.7 g/dL — ABNORMAL LOW (ref 12.0–15.0)
Immature Granulocytes: 0 %
Lymphocytes Relative: 16 %
Lymphs Abs: 0.7 K/uL (ref 0.7–4.0)
MCH: 29.5 pg (ref 26.0–34.0)
MCHC: 32.6 g/dL (ref 30.0–36.0)
MCV: 90.6 fL (ref 80.0–100.0)
Monocytes Absolute: 0.3 K/uL (ref 0.1–1.0)
Monocytes Relative: 8 %
Neutro Abs: 3.1 K/uL (ref 1.7–7.7)
Neutrophils Relative %: 72 %
Platelets: 244 K/uL (ref 150–400)
RBC: 3.29 MIL/uL — ABNORMAL LOW (ref 3.87–5.11)
RDW: 13.2 % (ref 11.5–15.5)
WBC: 4.3 K/uL (ref 4.0–10.5)
nRBC: 0 % (ref 0.0–0.2)

## 2024-02-10 LAB — URINALYSIS, ROUTINE W REFLEX MICROSCOPIC
Bilirubin Urine: NEGATIVE
Glucose, UA: 50 mg/dL — AB
Hgb urine dipstick: NEGATIVE
Ketones, ur: NEGATIVE mg/dL
Leukocytes,Ua: NEGATIVE
Nitrite: NEGATIVE
Protein, ur: NEGATIVE mg/dL
Specific Gravity, Urine: 1.01 (ref 1.005–1.030)
pH: 6 (ref 5.0–8.0)

## 2024-02-10 LAB — BLOOD GAS, VENOUS
Acid-Base Excess: 5.5 mmol/L — ABNORMAL HIGH (ref 0.0–2.0)
Bicarbonate: 31.1 mmol/L — ABNORMAL HIGH (ref 20.0–28.0)
Drawn by: 42942
O2 Saturation: 20.2 %
Patient temperature: 36.8
pCO2, Ven: 48 mmHg (ref 44–60)
pH, Ven: 7.42 (ref 7.25–7.43)
pO2, Ven: <31 mmHg — CL (ref 32–45)

## 2024-02-10 LAB — RETICULOCYTES
Immature Retic Fract: 13.4 % (ref 2.3–15.9)
RBC.: 2.87 MIL/uL — ABNORMAL LOW (ref 3.87–5.11)
Retic Count, Absolute: 36.7 K/uL (ref 19.0–186.0)
Retic Ct Pct: 1.3 % (ref 0.4–3.1)

## 2024-02-10 LAB — RAPID URINE DRUG SCREEN, HOSP PERFORMED
Amphetamines: NOT DETECTED
Barbiturates: NOT DETECTED
Benzodiazepines: NOT DETECTED
Cocaine: NOT DETECTED
Opiates: NOT DETECTED
Tetrahydrocannabinol: POSITIVE — AB

## 2024-02-10 LAB — MAGNESIUM: Magnesium: 1.9 mg/dL (ref 1.7–2.4)

## 2024-02-10 LAB — IRON AND TIBC
Iron: 37 ug/dL (ref 28–170)
Saturation Ratios: 11 % (ref 10.4–31.8)
TIBC: 338 ug/dL (ref 250–450)
UIBC: 301 ug/dL

## 2024-02-10 LAB — VITAMIN B12: Vitamin B-12: 230 pg/mL (ref 180–914)

## 2024-02-10 LAB — FERRITIN: Ferritin: 5 ng/mL — ABNORMAL LOW (ref 11–307)

## 2024-02-10 LAB — FOLATE: Folate: >20 ng/mL (ref >5.9)

## 2024-02-10 LAB — CBG MONITORING, ED: Glucose-Capillary: 111 mg/dL — ABNORMAL HIGH (ref 70–99)

## 2024-02-10 LAB — LIPASE, BLOOD: Lipase: 23 U/L (ref 11–51)

## 2024-02-10 LAB — LACTIC ACID, PLASMA: Lactic Acid, Venous: 0.9 mmol/L (ref 0.5–1.9)

## 2024-02-10 MED ORDER — SODIUM CHLORIDE 0.9 % IV SOLN: INTRAVENOUS | Status: AC

## 2024-02-10 MED ORDER — LACTATED RINGERS IV SOLN: INTRAVENOUS | Status: DC

## 2024-02-10 MED ORDER — POTASSIUM CHLORIDE 10 MEQ/100ML IV SOLN
10.0000 meq | INTRAVENOUS | Status: AC
  Administered 2024-02-10 (×6): 10 meq via INTRAVENOUS
  Filled 2024-02-10 (×5): qty 100

## 2024-02-10 MED ORDER — POTASSIUM CHLORIDE CRYS ER 20 MEQ PO TBCR
40.0000 meq | EXTENDED_RELEASE_TABLET | Freq: Once | ORAL | Status: AC
  Administered 2024-02-10: 40 meq via ORAL
  Filled 2024-02-10: qty 2

## 2024-02-10 MED ORDER — POTASSIUM CHLORIDE CRYS ER 20 MEQ PO TBCR: 40.0000 meq | EXTENDED_RELEASE_TABLET | Freq: Once | ORAL | Status: DC

## 2024-02-10 MED ORDER — LACTATED RINGERS IV BOLUS
1000.0000 mL | Freq: Once | INTRAVENOUS | Status: AC
  Administered 2024-02-10: 1000 mL via INTRAVENOUS

## 2024-02-10 MED ORDER — ATORVASTATIN CALCIUM 20 MG PO TABS
20.0000 mg | ORAL_TABLET | Freq: Every day | ORAL | Status: DC
  Administered 2024-02-10 – 2024-02-15 (×6): 20 mg via ORAL
  Filled 2024-02-10 (×3): qty 1
  Filled 2024-02-10: qty 2
  Filled 2024-02-10 (×2): qty 1

## 2024-02-10 MED ORDER — HYDRALAZINE HCL 20 MG/ML IJ SOLN: 5.0000 mg | INTRAMUSCULAR | Status: DC | PRN

## 2024-02-10 MED ORDER — HEPARIN SODIUM (PORCINE) 5000 UNIT/ML IJ SOLN
5000.0000 [IU] | Freq: Three times a day (TID) | INTRAMUSCULAR | Status: DC
  Administered 2024-02-11 – 2024-02-15 (×14): 5000 [IU] via SUBCUTANEOUS
  Filled 2024-02-10 (×14): qty 1

## 2024-02-10 MED ORDER — OXYCODONE HCL 5 MG PO TABS
5.0000 mg | ORAL_TABLET | ORAL | Status: DC | PRN
  Administered 2024-02-12 – 2024-02-15 (×13): 5 mg via ORAL
  Filled 2024-02-10 (×13): qty 1

## 2024-02-10 MED ORDER — AMLODIPINE BESYLATE 5 MG PO TABS
5.0000 mg | ORAL_TABLET | Freq: Every day | ORAL | Status: DC
  Administered 2024-02-10 – 2024-02-13 (×4): 5 mg via ORAL
  Filled 2024-02-10 (×4): qty 1

## 2024-02-10 MED ORDER — PANTOPRAZOLE SODIUM 40 MG PO TBEC
40.0000 mg | DELAYED_RELEASE_TABLET | Freq: Every day | ORAL | Status: DC
  Administered 2024-02-10 – 2024-02-11 (×2): 40 mg via ORAL
  Filled 2024-02-10 (×2): qty 1

## 2024-02-10 MED ORDER — PHOSPHA 250 NEUTRAL 155-852-130 MG PO TABS
500.0000 mg | ORAL_TABLET | ORAL | Status: AC
  Administered 2024-02-11 (×2): 500 mg via ORAL
  Filled 2024-02-10 (×3): qty 2

## 2024-02-10 MED ORDER — MORPHINE SULFATE (PF) 2 MG/ML IV SOLN
2.0000 mg | INTRAVENOUS | Status: DC | PRN
  Administered 2024-02-10 – 2024-02-15 (×7): 2 mg via INTRAVENOUS
  Filled 2024-02-10 (×7): qty 1

## 2024-02-10 MED ORDER — IOHEXOL 300 MG/ML  SOLN
100.0000 mL | Freq: Once | INTRAMUSCULAR | Status: AC | PRN
  Administered 2024-02-10: 80 mL via INTRAVENOUS

## 2024-02-10 MED ORDER — ACETAMINOPHEN 325 MG PO TABS
650.0000 mg | ORAL_TABLET | Freq: Four times a day (QID) | ORAL | Status: DC | PRN
  Administered 2024-02-13 – 2024-02-14 (×3): 650 mg via ORAL
  Filled 2024-02-10 (×4): qty 2

## 2024-02-10 MED ORDER — ACETAMINOPHEN 650 MG RE SUPP: 650.0000 mg | Freq: Four times a day (QID) | RECTAL | Status: DC | PRN

## 2024-02-10 MED ORDER — POTASSIUM CHLORIDE 10 MEQ/100ML IV SOLN
10.0000 meq | INTRAVENOUS | Status: AC
  Administered 2024-02-10 – 2024-02-11 (×6): 10 meq via INTRAVENOUS
  Filled 2024-02-10 (×6): qty 100

## 2024-02-10 MED ORDER — MORPHINE SULFATE (PF) 4 MG/ML IV SOLN
2.0000 mg | Freq: Once | INTRAVENOUS | Status: AC
  Administered 2024-02-10: 2 mg via INTRAVENOUS
  Filled 2024-02-10: qty 1

## 2024-02-10 MED ORDER — INSULIN ASPART 100 UNIT/ML IJ SOLN: 0.0000 [IU] | Freq: Every day | INTRAMUSCULAR | Status: DC

## 2024-02-10 MED ORDER — INSULIN ASPART 100 UNIT/ML IJ SOLN
0.0000 [IU] | Freq: Three times a day (TID) | INTRAMUSCULAR | Status: DC
  Administered 2024-02-11 – 2024-02-12 (×3): 2 [IU] via SUBCUTANEOUS
  Administered 2024-02-12: 1 [IU] via SUBCUTANEOUS
  Administered 2024-02-13: 2 [IU] via SUBCUTANEOUS
  Administered 2024-02-13: 3 [IU] via SUBCUTANEOUS
  Administered 2024-02-13: 5 [IU] via SUBCUTANEOUS
  Administered 2024-02-14: 3 [IU] via SUBCUTANEOUS
  Administered 2024-02-14: 1 [IU] via SUBCUTANEOUS
  Administered 2024-02-15: 2 [IU] via SUBCUTANEOUS

## 2024-02-10 MED ORDER — POTASSIUM CHLORIDE CRYS ER 20 MEQ PO TBCR
40.0000 meq | EXTENDED_RELEASE_TABLET | Freq: Once | ORAL | Status: AC
  Administered 2024-02-11: 40 meq via ORAL
  Filled 2024-02-10: qty 2

## 2024-02-10 MED ORDER — ONDANSETRON HCL 4 MG/2ML IJ SOLN
4.0000 mg | Freq: Once | INTRAMUSCULAR | Status: AC
  Administered 2024-02-10: 4 mg via INTRAVENOUS
  Filled 2024-02-10: qty 2

## 2024-02-10 MED ORDER — MAGNESIUM SULFATE IN D5W 1-5 GM/100ML-% IV SOLN
1.0000 g | Freq: Once | INTRAVENOUS | Status: AC
  Administered 2024-02-10: 1 g via INTRAVENOUS
  Filled 2024-02-10: qty 100

## 2024-02-10 NOTE — H&P (Signed)
 [facility] H&P   Patient Demographics:    Monique Nguyen, is a 53 y.o. female  MRN: [id number]   DOB - 1971/02/28  Admit Date - 02/10/2024  Outpatient Primary MD for the patient is Vicky Berg, MD  Referring MD/NP/PA: Dr Ula  Patient coming from: home  Chief Complaint  Patient presents with   Abscess      HPI:    Monique Nguyen  is a 53 y.o. female, with a history of alcohol  induced chronic pancreatitis, failure to thrive, pancreatic pseudocyst, anxiety/depression, and chronic abdominal pain, urinary retention, no further alcohol  abuse, presents to ED due to concern of buttocks abscess, patient has some nausea, but no vomiting, he is she is having chronic abdominal pain patient in ED reports she has a concern of pilonidal cyst, no fever, no discharge, reports been bothering her for roughly a year, she was told to follow-up with the surgery as an outpatient but did not follow so far.. - In ED she was noted with no palpable abscess, erythema or warmth by ED physician, CT abdomen pelvis with no evidence of buttocks abscess, but was significant for bilateral hydronephrosis due to urinary retention, as well workup significant for severe hypokalemia 2.1, and hypophosphatemia, so [facility]  hospitalist consulted to admit.     Review of systems:     A full 10 point Review of Systems was done, except as stated above, all other Review of Systems were negative.   With Past History of the following :    Past Medical History:  Diagnosis Date   Alcohol  abuse    Anxiety    Bacterial vaginosis    Chronic abdominal pain    Chronic pain    Depression    GERD (gastroesophageal reflux disease)    IBS (irritable bowel syndrome)    Pancreatitis    ETOH   Panic attacks    Psoriasis       Past Surgical History:  Procedure Laterality Date   BLADDER SURGERY     X 3    ESOPHAGOGASTRODUODENOSCOPY (EGD) WITH PROPOFOL  N/A 10/16/2018   Procedure: ESOPHAGOGASTRODUODENOSCOPY (EGD) WITH PROPOFOL ;  Surgeon: Shaaron Lamar HERO, MD;  [facility];  Service: Endoscopy;  Laterality: N/A;   ESOPHAGOGASTRODUODENOSCOPY (EGD) WITH PROPOFOL  N/A 10/22/2018   Procedure: ESOPHAGOGASTRODUODENOSCOPY (EGD) WITH PROPOFOL ;  Surgeon: Kallie Manuelita BROCKS, MD;  Location: [facility];  Service: General;  Laterality: N/A;   ESOPHAGOGASTRODUODENOSCOPY (EGD) WITH PROPOFOL   02/01/2019   Procedure: ESOPHAGOGASTRODUODENOSCOPY (EGD) WITH PROPOFOL ;  Surgeon: Harvey Margo CROME, MD;  Location: [facility];  Service: Endoscopy;;   EUS N/A 10/11/2012   Dr. Teressa: 3.7 cm cystic lesion in body of pancreas with large amount of debris, s/p FNA with reddish, milky fluid, main pancreatic duct normal,    EUS  12/2015   mild to moderate chronic pancreatitis, pancreatic duct with dilation and intraductal stones, measuring up to 6 mm  in diameter, no abnormality in main bile duct, abnormal lymph nodes in perigastric region, largest measuring 12 mm in diameter, s/p fine needle biopsy. Pathology with multiple fragments of lymphoid tissue with granulomatous inflammation, negative GMS and AFB stains,   PEG PLACEMENT N/A 10/22/2018   Procedure: PERCUTANEOUS ENDOSCOPIC GASTROSTOMY (PEG) PLACEMENT with endoscopy;  Surgeon: Kallie Manuelita BROCKS, MD;  Location: [facility];  Service: General;  Laterality: N/A;      Social History:     Social History   Tobacco Use   Smoking status: Some Days    Current packs/day: 1.00    Average packs/day: 1 pack/day for 8.0 years (8.0 ttl pk-yrs)    Types: Cigarettes   Smokeless tobacco: Never   Tobacco comments:    States no smoking since 5/21  Substance Use Topics   Alcohol  use: Yes    Alcohol /week: 3.0 standard drinks of alcohol     Types: 3 Cans of beer per week    Comment: last drink 06/05/18, hx of ETOH abuse      Family History :     Family History  Problem Relation Age of  Onset   Depression Mother        living   Pancreatic cancer Father        PATIENT STATES PROSTATE CANCER. UNCLEAR IF ACTUAL PANCREATIC CANCER   Colon cancer Neg Hx    Colon polyps Neg Hx       Home Medications:   Prior to Admission medications   Medication Sig Start Date End Date Taking? Authorizing Provider  aspirin-acetaminophen -caffeine  (EXCEDRIN MIGRAINE) 250-250-65 MG tablet Take 2 tablets by mouth every 6 (six) hours as needed for headache.    [provider]  atorvastatin  (LIPITOR) 20 MG tablet Take 20 mg by mouth daily. 11/15/21   [provider]  fluconazole  (DIFLUCAN ) 150 MG tablet Take 1 tablet (150 mg total) by mouth daily. 01/25/22   Blue, Soijett A, PA-C  hydrOXYzine  (ATARAX ) 25 MG tablet Take 25 mg by mouth every 8 (eight) hours as needed. 01/05/22   [provider]  imipramine  (TOFRANIL ) 10 MG tablet Place 1 tablet (10 mg total) into feeding tube at bedtime. 11/07/18   Johnson, Clanford L, MD  metFORMIN  (GLUCOPHAGE ) 500 MG tablet Take 1 tablet (500 mg total) by mouth 2 (two) times daily with a meal. Patient not taking: Reported on 01/25/2022 08/11/21 09/10/21  Cleotilde Rogue, MD  metFORMIN  (GLUCOPHAGE ) 850 MG tablet Take 850 mg by mouth 2 (two) times daily. 01/05/22   [provider]  pantoprazole  (PROTONIX ) 20 MG tablet Take 20 mg by mouth daily. 01/18/22   [provider]  sertraline  (ZOLOFT ) 100 MG tablet Take 100 mg by mouth daily. 01/18/22   [provider]     Allergies:     Allergies  Allergen Reactions   Doxycycline  Other (See Comments)    headaches   Sulfa  Antibiotics Nausea And Vomiting and Other (See Comments)    Blurred vision and headaches   Sulfamethoxazole -Trimethoprim  Other (See Comments)     Physical Exam:   Vitals  Blood pressure (!) 129/100, pulse (!) 105, temperature 98 F (36.7 C), temperature source Oral, resp. rate 15, height 5' 1 (1.549 m), weight 43.5 kg, last menstrual period 04/12/2013, SpO2  99%.   1. General Frail elderly female, laying in bed, no apparent distress  2. Normal affect and insight, Not Suicidal or Homicidal, Awake Alert, Oriented X 3.  3. No F.N deficits, ALL C.Nerves Intact, Strength 5/5 all 4 extremities,  Sensation intact all 4 extremities, Plantars down going.  4. Ears and Eyes appear Normal, Conjunctivae clear, PERRLA. Moist Oral Mucosa.  5. Supple Neck, No JVD, No cervical lymphadenopathy appriciated, No Carotid Bruits.  6. Symmetrical Chest wall movement, Good air movement bilaterally, CTAB.  7. RRR, No Gallops, Rubs or Murmurs, No Parasternal Heave.  8. Positive Bowel Sounds, Abdomen Soft(Foley already inserted), No tenderness, No organomegaly appriciated,No rebound -guarding or rigidity.  9.  No Cyanosis, Normal Skin Turgor, No Skin Rash or Bruise.  10. Good muscle tone,  joints appear normal , no effusions, Normal ROM.    Data Review:    CBC Recent Labs  Lab 02/10/24 1237  WBC 4.3  HGB 9.7*  HCT 29.8*  PLT 244  MCV 90.6  MCH 29.5  MCHC 32.6  RDW 13.2  LYMPHSABS 0.7  MONOABS 0.3  EOSABS 0.1  BASOSABS 0.0   ------------------------------------------------------------------------------------------------------------------  Chemistries  Recent Labs  Lab 02/10/24 1237 02/10/24 1440  NA 143  --   K 2.1*  --   CL 99  --   CO2 28  --   GLUCOSE 141*  --   BUN 14  --   CREATININE 0.97  --   CALCIUM  9.1  --   MG  --  1.9  AST 22  --   ALT 14  --   ALKPHOS 59  --   BILITOT 0.7  --    ------------------------------------------------------------------------------------------------------------------ estimated creatinine clearance is 46.1 mL/min (by C-G formula based on SCr of 0.97 mg/dL). ------------------------------------------------------------------------------------------------------------------ No results for input(s): TSH, T4TOTAL, T3FREE, THYROIDAB in the last 72 hours.  Invalid input(s):  FREET3  Coagulation profile No results for input(s): INR, PROTIME in the last 168 hours. ------------------------------------------------------------------------------------------------------------------- No results for input(s): DDIMER in the last 72 hours. -------------------------------------------------------------------------------------------------------------------  Cardiac Enzymes No results for input(s): CKMB, TROPONINI, MYOGLOBIN in the last 168 hours.  Invalid input(s): CK ------------------------------------------------------------------------------------------------------------------ No results found for: BNP   ---------------------------------------------------------------------------------------------------------------  Urinalysis    Component Value Date/Time   COLORURINE YELLOW 02/10/2024 1950   APPEARANCEUR CLEAR 02/10/2024 1950   LABSPEC 1.010 02/10/2024 1950   PHURINE 6.0 02/10/2024 1950   GLUCOSEU 50 (A) 02/10/2024 1950   HGBUR NEGATIVE 02/10/2024 1950   BILIRUBINUR NEGATIVE 02/10/2024 1950   KETONESUR NEGATIVE 02/10/2024 1950   PROTEINUR NEGATIVE 02/10/2024 1950   UROBILINOGEN 0.2 03/06/2015 1558   NITRITE NEGATIVE 02/10/2024 1950   LEUKOCYTESUR NEGATIVE 02/10/2024 1950    ----------------------------------------------------------------------------------------------------------------   Imaging Results:    CT ABDOMEN PELVIS W CONTRAST Result Date: 02/10/2024 CLINICAL DATA:  Abdominal pain, acute abdominal, nonlocalized. Abscess on buttocks. Nausea and back pain. EXAM: CT ABDOMEN AND PELVIS WITH CONTRAST TECHNIQUE: Multidetector CT imaging of the abdomen and pelvis was performed using the standard protocol following bolus administration of intravenous contrast. RADIATION DOSE REDUCTION: This exam was performed according to the departmental dose-optimization program which includes automated exposure control, adjustment of the mA and/or kV  according to patient size and/or use of iterative reconstruction technique. CONTRAST:  80mL OMNIPAQUE  IOHEXOL  300 MG/ML  SOLN COMPARISON:  01/25/2022. FINDINGS: Lower chest: No acute abnormality. Hepatobiliary: No focal liver abnormality is seen. Fatty infiltration of the liver is noted no gallstones, gallbladder wall thickening, or biliary dilatation. Pancreas: Calcifications are noted in the pancreatic head. No pancreatic ductal dilatation or surrounding inflammatory changes. There is atrophy of the pancreatic tail. Spleen: Normal in size without focal abnormality. Adrenals/Urinary Tract: The adrenal glands are within normal limits. The kidneys enhance symmetrically. No renal calculus bilaterally.  There is moderate hydroureteronephrosis bilaterally without evidence of obstructing calculus. The urinary bladder is markedly distended. Stomach/Bowel: The stomach is within normal limits. No bowel obstruction, free air, or pneumatosis is seen. Appendix appears normal. Vascular/Lymphatic: Aortic atherosclerosis. No enlarged abdominal or pelvic lymph nodes. Reproductive: The uterus is within normal limits. No adnexal mass is seen. Other: No free fluid in the pelvis.  No obvious abscess is seen. Musculoskeletal: No acute osseous abnormality. IMPRESSION: 1. No evidence of abscess. 2. Markedly distended urinary bladder with moderate hydroureteronephrosis bilaterally suggesting bladder outlet obstruction. 3. And [place]ock steatosis. 4. Sequela of chronic pancreatitis. 5. Aortic atherosclerosis. Electronically Signed   By: Leita Birmingham M.D.   On: 02/10/2024 17:13     EKG:  Vent. rate 107 BPM PR interval 171 ms QRS duration 78 ms QT/QTcB 352/470 ms P-R-T axes 69 70 46 Sinus tachycardia Biatrial enlargement Probable left ventricular hypertrophy  Assessment & Plan:    Principal Problem:   Hypokalemia Active Problems:   Chronic pancreatitis (HCC)   Chronic abdominal pain   Hypokalemia - Potassium  significantly low at 2.1, will replace aggressively, monitor closely on telemetry/stepdown - Received 60 mEq of IV potassium, and 40 p.o. once, will repeat another 40 p.o. and recheck level  Urinary retention Bilateral hydronephrosis>> due to urinary retention - Reports history of allergic surgery in the past, Foley catheter inserted in ED with 1500 cc output so far (still draining). - Will discharge with Foley catheter with routine follow-up with urology - Will check urine analysis  Hypophosphatemia -Replacing, recheck in a.m.  Chronic pancreatitis -No acute finding on imaging, no nausea, no vomiting  Chronic abdominal pain -Due to above, as tolerated urinary retention, continue with as needed pain meds  Chronic abdominal pain -Multifactorial including constipation, urinary retention, acute on chronic pancreatitis and pseudocysts -Continue morphine  IV prn    Anemia of chronic Disease - Check anemia panel  THC use -She endorses THC use, will check urine drug screen   Diabetes mellitus - Patient reports she is on metformin , will hold, will keep on insulin  sliding scale and check A1c  Hypertension -Blood pressure elevated, not on any home medication, will start on Norvasc  and as needed hydralazine .  DVT Prophylaxis Heparin   AM Labs Ordered, also please review Full Orders  Family Communication: Admission, patients condition and plan of care including tests being ordered have been discussed with the patient who indicate understanding and agree with the plan and Code Status.  Code Status full code  Likely DC to home  Consults called: None  Admission status: Observation  Time spent in minutes : 70 minutes   Brayton Lye M.D on 02/10/2024 at 8:34 PM   [facility] - Office  [phone]

## 2024-02-10 NOTE — ED Triage Notes (Addendum)
 Pt c/o abscess on her bottom. Pt states she was suppose to have been treated already for it but has not had a chance yet. Pt states having abscess for over a year. Pt states anxiety. Pt states just some nausea. Pt states pain in her back.

## 2024-02-10 NOTE — ED Notes (Signed)
 Patient transported to CT

## 2024-02-10 NOTE — ED Notes (Signed)
 Per hospitalist, repeat potassium level to determine if patient can go to [facility] or will need Stepdown

## 2024-02-10 NOTE — ED Notes (Signed)
 Bedside commode in room with clear urine that was voided prior to 1900. Upon placing foley catheter, output was . Foley catheter kinked momentarily. Hospitalist at bedside and aware

## 2024-02-10 NOTE — ED Provider Notes (Signed)
 [place] EMERGENCY DEPARTMENT AT [facility] Provider Note   CSN: [id number] Arrival date & time: 02/10/24  1114     Patient presents with: Abscess   Monique Nguyen is a 53 y.o. female.   53 year old female with past medical history of diabetes, hyperlipidemia, and pancreatitis in the past presenting to the emergency department today with abdominal pain.  The patient that she has been having diffuse abdominal pain now over the past few days.  Has had some nausea but denies any associated vomiting.  She reports normal bowel movements with this.  Denies any fevers.  She states that this does feel similar to when she had pancreatitis in the past.  She reports that she did have a stent placed years ago for this.  She states that she has been doing well up until recently.  She also brought up at triage that she has a pilonidal cyst.  The patient states that this has been bothering her now for roughly 1 year.  She was told that she should follow-up with a surgeon but has not followed up with them so far.  She denies any fevers or worsening pain in the area.   Abscess Associated symptoms: nausea        Prior to Admission medications   Medication Sig Start Date End Date Taking? Authorizing Provider  aspirin-acetaminophen -caffeine  (EXCEDRIN MIGRAINE) 250-250-65 MG tablet Take 2 tablets by mouth every 6 (six) hours as needed for headache.    [provider]  atorvastatin  (LIPITOR) 20 MG tablet Take 20 mg by mouth daily. 11/15/21   [provider]  fluconazole  (DIFLUCAN ) 150 MG tablet Take 1 tablet (150 mg total) by mouth daily. 01/25/22   Blue, Soijett A, PA-C  hydrOXYzine  (ATARAX ) 25 MG tablet Take 25 mg by mouth every 8 (eight) hours as needed. 01/05/22   [provider]  imipramine  (TOFRANIL ) 10 MG tablet Place 1 tablet (10 mg total) into feeding tube at bedtime. 11/07/18   Johnson, Clanford L, MD  metFORMIN  (GLUCOPHAGE ) 500 MG tablet Take 1 tablet (500 mg  total) by mouth 2 (two) times daily with a meal. Patient not taking: Reported on 01/25/2022 08/11/21 09/10/21  Cleotilde Rogue, MD  metFORMIN  (GLUCOPHAGE ) 850 MG tablet Take 850 mg by mouth 2 (two) times daily. 01/05/22   [provider]  pantoprazole  (PROTONIX ) 20 MG tablet Take 20 mg by mouth daily. 01/18/22   [provider]  sertraline  (ZOLOFT ) 100 MG tablet Take 100 mg by mouth daily. 01/18/22   [provider]    Allergies: Doxycycline , Sulfa  antibiotics, and Sulfamethoxazole -trimethoprim     Review of Systems  Gastrointestinal:  Positive for abdominal pain and nausea.  All other systems reviewed and are negative.   Updated Vital Signs BP (!) 157/100   Pulse (!) 105   Temp 98.3 F (36.8 C) (Oral)   Resp 13   Ht 5' 1 (1.549 m)   Wt 43.5 kg   LMP 04/12/2013   SpO2 97%   BMI 18.14 kg/m   Physical Exam Vitals and nursing note reviewed.   Gen: NAD Eyes: PERRL, EOMI HEENT: no oropharyngeal swelling Neck: trachea midline Resp: clear to auscultation bilaterally Card: Tachycardic, no murmurs, rubs, or gallops Abd: Mild diffuse tenderness with no guarding or rebound Extremities: no calf tenderness, no edema Vascular: 2+ radial pulses bilaterally, 2+ DP pulses bilaterally Skin: The patient has some mild tenderness over the left sacral region with no palpable abscess noted, no overlying erythema or warmth is  noted Psyc: acting appropriately   (all labs ordered are listed, but only abnormal results are displayed) Labs Reviewed  CBC WITH DIFFERENTIAL/PLATELET - Abnormal; Notable for the following components:      Result Value   RBC 3.29 (*)    Hemoglobin 9.7 (*)    HCT 29.8 (*)    All other components within normal limits  COMPREHENSIVE METABOLIC PANEL WITH GFR - Abnormal; Notable for the following components:   Potassium 2.1 (*)    Glucose, Bld 141 (*)    Anion gap 16 (*)    All other components within normal limits  BLOOD GAS, VENOUS - Abnormal;  Notable for the following components:   pO2, Ven <31 (*)    Bicarbonate 31.1 (*)    Acid-Base Excess 5.5 (*)    All other components within normal limits  CULTURE, BLOOD (ROUTINE X 2)  CULTURE, BLOOD (ROUTINE X 2)  LACTIC ACID, PLASMA  MAGNESIUM   LIPASE, BLOOD  URINALYSIS, ROUTINE W REFLEX MICROSCOPIC    EKG: EKG Interpretation Date/Time:  Saturday February 10 2024 13:34:45 EDT Ventricular Rate:  107 PR Interval:  171 QRS Duration:  78 QT Interval:  352 QTC Calculation: 470 R Axis:   70  Text Interpretation: Sinus tachycardia Biatrial enlargement Probable left ventricular hypertrophy Confirmed by Melvenia Motto ([id number]) on 02/10/2024 2:30:57 PM  Radiology: CT ABDOMEN PELVIS W CONTRAST Result Date: 02/10/2024 CLINICAL DATA:  Abdominal pain, acute abdominal, nonlocalized. Abscess on buttocks. Nausea and back pain. EXAM: CT ABDOMEN AND PELVIS WITH CONTRAST TECHNIQUE: Multidetector CT imaging of the abdomen and pelvis was performed using the standard protocol following bolus administration of intravenous contrast. RADIATION DOSE REDUCTION: This exam was performed according to the departmental dose-optimization program which includes automated exposure control, adjustment of the mA and/or kV according to patient size and/or use of iterative reconstruction technique. CONTRAST:  80mL OMNIPAQUE  IOHEXOL  300 MG/ML  SOLN COMPARISON:  01/25/2022. FINDINGS: Lower chest: No acute abnormality. Hepatobiliary: No focal liver abnormality is seen. Fatty infiltration of the liver is noted no gallstones, gallbladder wall thickening, or biliary dilatation. Pancreas: Calcifications are noted in the pancreatic head. No pancreatic ductal dilatation or surrounding inflammatory changes. There is atrophy of the pancreatic tail. Spleen: Normal in size without focal abnormality. Adrenals/Urinary Tract: The adrenal glands are within normal limits. The kidneys enhance symmetrically. No renal calculus bilaterally. There is  moderate hydroureteronephrosis bilaterally without evidence of obstructing calculus. The urinary bladder is markedly distended. Stomach/Bowel: The stomach is within normal limits. No bowel obstruction, free air, or pneumatosis is seen. Appendix appears normal. Vascular/Lymphatic: Aortic atherosclerosis. No enlarged abdominal or pelvic lymph nodes. Reproductive: The uterus is within normal limits. No adnexal mass is seen. Other: No free fluid in the pelvis.  No obvious abscess is seen. Musculoskeletal: No acute osseous abnormality. IMPRESSION: 1. No evidence of abscess. 2. Markedly distended urinary bladder with moderate hydroureteronephrosis bilaterally suggesting bladder outlet obstruction. 3. And Paddock steatosis. 4. Sequela of chronic pancreatitis. 5. Aortic atherosclerosis. Electronically Signed   By: Leita Birmingham M.D.   On: 02/10/2024 17:13     Procedures   Medications Ordered in the ED  0.9 %  sodium chloride  infusion ( Intravenous New Bag/Given 02/10/24 1451)  potassium chloride  10 mEq in 100 mL IVPB (10 mEq Intravenous New Bag/Given 02/10/24 1910)  potassium chloride  SA (KLOR-CON  M) CR tablet 40 mEq (40 mEq Oral Given 02/10/24 1455)  morphine  (PF) 4 MG/ML injection 2 mg (2 mg Intravenous Given 02/10/24 1544)  ondansetron  (ZOFRAN ) injection 4  mg (4 mg Intravenous Given 02/10/24 1543)  lactated ringers  bolus 1,000 mL (0 mLs Intravenous Stopped 02/10/24 1719)  iohexol  (OMNIPAQUE ) 300 MG/ML solution 100 mL (80 mLs Intravenous Contrast Given 02/10/24 1613)                                    Medical Decision Making 53 year old female presenting to the emergency department today principally with abdominal pain.  I will further evaluate patient here with basic labs including LFTs and lipase to eval for recurrent pancreatitis or valvular pathology.  Will obtain a CT scan of her abdomen to evaluate for diverticulitis, colitis, obstruction, or other intra-abdominal pathology.  Also evaluate for large  fluid collection but on my exam I do not appreciate any large pilonidal cyst or abscess.  Will give the patient IV fluids here as well as morphine  and Zofran  for her symptoms.  I will reevaluate for ultimate disposition.  The patient's potassium is critically low at 2.1.  Her EKG shows a sinus rhythm with QTc of 470.  The patient CT scan shows findings concerning for bladder outlet obstruction.  A Foley catheter is ordered.  There were findings consistent with chronic pancreatitis which may be causing some of the patient's symptoms.  A call was placed to the hospitalist service for admission.  There was no significant fluid collection noted in the pilonidal region on CT.  Amount and/or Complexity of Data Reviewed Labs: ordered. Radiology: ordered.  Risk Prescription drug management. Decision regarding hospitalization.        Final diagnoses:  Hypokalemia  Urinary retention    ED Discharge Orders     None          Ula Prentice SAUNDERS, MD 02/10/24 [id number]

## 2024-02-11 ENCOUNTER — Observation Stay (HOSPITAL_COMMUNITY)

## 2024-02-11 DIAGNOSIS — E860 Dehydration: Secondary | ICD-10-CM | POA: Diagnosis present

## 2024-02-11 DIAGNOSIS — Z818 Family history of other mental and behavioral disorders: Secondary | ICD-10-CM | POA: Diagnosis not present

## 2024-02-11 DIAGNOSIS — K5900 Constipation, unspecified: Secondary | ICD-10-CM | POA: Diagnosis present

## 2024-02-11 DIAGNOSIS — E876 Hypokalemia: Secondary | ICD-10-CM | POA: Diagnosis present

## 2024-02-11 DIAGNOSIS — K863 Pseudocyst of pancreas: Secondary | ICD-10-CM | POA: Diagnosis present

## 2024-02-11 DIAGNOSIS — F410 Panic disorder [episodic paroxysmal anxiety] without agoraphobia: Secondary | ICD-10-CM | POA: Diagnosis present

## 2024-02-11 DIAGNOSIS — E119 Type 2 diabetes mellitus without complications: Secondary | ICD-10-CM | POA: Diagnosis present

## 2024-02-11 DIAGNOSIS — R0600 Dyspnea, unspecified: Secondary | ICD-10-CM | POA: Diagnosis not present

## 2024-02-11 DIAGNOSIS — Z882 Allergy status to sulfonamides status: Secondary | ICD-10-CM | POA: Diagnosis not present

## 2024-02-11 DIAGNOSIS — E785 Hyperlipidemia, unspecified: Secondary | ICD-10-CM | POA: Diagnosis present

## 2024-02-11 DIAGNOSIS — Z79899 Other long term (current) drug therapy: Secondary | ICD-10-CM | POA: Diagnosis not present

## 2024-02-11 DIAGNOSIS — Z7984 Long term (current) use of oral hypoglycemic drugs: Secondary | ICD-10-CM | POA: Diagnosis not present

## 2024-02-11 DIAGNOSIS — G8929 Other chronic pain: Secondary | ICD-10-CM | POA: Diagnosis present

## 2024-02-11 DIAGNOSIS — F32A Depression, unspecified: Secondary | ICD-10-CM | POA: Diagnosis present

## 2024-02-11 DIAGNOSIS — R627 Adult failure to thrive: Secondary | ICD-10-CM | POA: Diagnosis present

## 2024-02-11 DIAGNOSIS — Z800 Family history of malignant neoplasm of digestive organs: Secondary | ICD-10-CM | POA: Diagnosis not present

## 2024-02-11 DIAGNOSIS — Z881 Allergy status to other antibiotic agents status: Secondary | ICD-10-CM | POA: Diagnosis not present

## 2024-02-11 DIAGNOSIS — N1330 Unspecified hydronephrosis: Secondary | ICD-10-CM | POA: Diagnosis present

## 2024-02-11 DIAGNOSIS — I10 Essential (primary) hypertension: Secondary | ICD-10-CM | POA: Diagnosis present

## 2024-02-11 DIAGNOSIS — F17210 Nicotine dependence, cigarettes, uncomplicated: Secondary | ICD-10-CM | POA: Diagnosis present

## 2024-02-11 DIAGNOSIS — K219 Gastro-esophageal reflux disease without esophagitis: Secondary | ICD-10-CM | POA: Diagnosis present

## 2024-02-11 DIAGNOSIS — D638 Anemia in other chronic diseases classified elsewhere: Secondary | ICD-10-CM | POA: Diagnosis present

## 2024-02-11 DIAGNOSIS — K860 Alcohol-induced chronic pancreatitis: Secondary | ICD-10-CM | POA: Diagnosis present

## 2024-02-11 LAB — BASIC METABOLIC PANEL WITH GFR
Anion gap: 10 (ref 5–15)
BUN: 7 mg/dL (ref 6–20)
CO2: 28 mmol/L (ref 22–32)
Calcium: 8.6 mg/dL — ABNORMAL LOW (ref 8.9–10.3)
Chloride: 104 mmol/L (ref 98–111)
Creatinine, Ser: 0.68 mg/dL (ref 0.44–1.00)
GFR, Estimated: >60 mL/min (ref >60)
Glucose, Bld: 133 mg/dL — ABNORMAL HIGH (ref 70–99)
Potassium: 3.3 mmol/L — ABNORMAL LOW (ref 3.5–5.1)
Sodium: 142 mmol/L (ref 135–145)

## 2024-02-11 LAB — PHOSPHORUS: Phosphorus: 3.4 mg/dL (ref 2.5–4.6)

## 2024-02-11 LAB — CBC
HCT: 28.4 % — ABNORMAL LOW (ref 36.0–46.0)
Hemoglobin: 9.4 g/dL — ABNORMAL LOW (ref 12.0–15.0)
MCH: 29.6 pg (ref 26.0–34.0)
MCHC: 33.1 g/dL (ref 30.0–36.0)
MCV: 89.3 fL (ref 80.0–100.0)
Platelets: 275 K/uL (ref 150–400)
RBC: 3.18 MIL/uL — ABNORMAL LOW (ref 3.87–5.11)
RDW: 12.6 % (ref 11.5–15.5)
WBC: 4.4 K/uL (ref 4.0–10.5)
nRBC: 0 % (ref 0.0–0.2)

## 2024-02-11 LAB — GLUCOSE, CAPILLARY
Glucose-Capillary: 116 mg/dL — ABNORMAL HIGH (ref 70–99)
Glucose-Capillary: 154 mg/dL — ABNORMAL HIGH (ref 70–99)
Glucose-Capillary: 120 mg/dL — ABNORMAL HIGH (ref 70–99)
Glucose-Capillary: 166 mg/dL — ABNORMAL HIGH (ref 70–99)

## 2024-02-11 LAB — HIV ANTIBODY (ROUTINE TESTING W REFLEX): HIV Screen 4th Generation wRfx: NONREACTIVE

## 2024-02-11 MED ORDER — HYDROXYZINE HCL 25 MG PO TABS
25.0000 mg | ORAL_TABLET | Freq: Four times a day (QID) | ORAL | Status: DC | PRN
  Administered 2024-02-11: 25 mg via ORAL
  Filled 2024-02-11: qty 1

## 2024-02-11 MED ORDER — LOPERAMIDE HCL 2 MG PO CAPS: 2.0000 mg | ORAL_CAPSULE | ORAL | Status: DC | PRN

## 2024-02-11 MED ORDER — POTASSIUM CHLORIDE CRYS ER 20 MEQ PO TBCR
40.0000 meq | EXTENDED_RELEASE_TABLET | Freq: Two times a day (BID) | ORAL | Status: AC
Start: 2024-02-11 — End: 2024-02-11
  Administered 2024-02-11 (×2): 40 meq via ORAL
  Filled 2024-02-11 (×2): qty 2

## 2024-02-11 MED ORDER — LORAZEPAM 1 MG PO TABS
1.0000 mg | ORAL_TABLET | Freq: Once | ORAL | Status: AC
  Administered 2024-02-11: 1 mg via ORAL
  Filled 2024-02-11: qty 1

## 2024-02-11 MED ORDER — TAMSULOSIN HCL 0.4 MG PO CAPS
0.4000 mg | ORAL_CAPSULE | Freq: Every day | ORAL | Status: DC
  Administered 2024-02-11 – 2024-02-15 (×5): 0.4 mg via ORAL
  Filled 2024-02-11 (×5): qty 1

## 2024-02-11 MED ORDER — SERTRALINE HCL 50 MG PO TABS
100.0000 mg | ORAL_TABLET | Freq: Every day | ORAL | Status: DC
  Administered 2024-02-11: 100 mg via ORAL
  Filled 2024-02-11: qty 2

## 2024-02-11 MED ORDER — ONDANSETRON HCL 4 MG/2ML IJ SOLN
4.0000 mg | Freq: Four times a day (QID) | INTRAMUSCULAR | Status: DC | PRN
  Administered 2024-02-11: 4 mg via INTRAVENOUS
  Filled 2024-02-11: qty 2

## 2024-02-11 NOTE — Progress Notes (Signed)
 Pts urine output via foley cath has started to decrease. It is now ranging from 300 mls - . Pts abdomen feels less distended. She complains of feeling pressure in her urethra. Educated pt on placement of foley cath. Pt stated she is feeling much better now that she is not retaining urine. Currently resting with eyes closed, rise and fall of chest visualized.

## 2024-02-11 NOTE — Plan of Care (Signed)
   Problem: Education: Goal: Knowledge of General Education information will improve Description: Including pain rating scale, medication(s)/side effects and non-pharmacologic comfort measures Outcome: Progressing   Problem: Nutrition: Goal: Adequate nutrition will be maintained Outcome: Progressing   Problem: Coping: Goal: Level of anxiety will decrease Outcome: Progressing

## 2024-02-11 NOTE — Plan of Care (Signed)
  Problem: Clinical Measurements: Goal: Will remain free from infection Outcome: Progressing   Problem: Activity: Goal: Risk for activity intolerance will decrease Outcome: Progressing   Problem: Coping: Goal: Level of anxiety will decrease Outcome: Progressing   Problem: Elimination: Goal: Will not experience complications related to urinary retention Outcome: Progressing   Problem: Pain Managment: Goal: General experience of comfort will improve and/or be controlled Outcome: Progressing   Problem: Safety: Goal: Ability to remain free from injury will improve Outcome: Progressing   Problem: Fluid Volume: Goal: Ability to maintain a balanced intake and output will improve Outcome: Progressing   Problem: Metabolic: Goal: Ability to maintain appropriate glucose levels will improve Outcome: Progressing   Problem: Tissue Perfusion: Goal: Adequacy of tissue perfusion will improve Outcome: Progressing

## 2024-02-11 NOTE — Progress Notes (Signed)
   02/11/24 0826  TOC Brief Assessment  Insurance and Status Reviewed  Patient has primary care physician Yes  Home environment has been reviewed From home  Prior level of function: Independent  Prior/Current Home Services No current home services  Social Drivers of Health Review SDOH reviewed interventions complete (smoking cessation added to AVS)  Readmission risk has been reviewed Yes  Transition of care needs no transition of care needs at this time   Transition of Care Department [facility]) has reviewed patient and no TOC needs have been identified at this time. We will continue to monitor patient advancement through interdisciplinary progression rounds. If new patient transition needs arise, please place a TOC consult.

## 2024-02-11 NOTE — Progress Notes (Signed)
 PROGRESS NOTE    MIO SCHELLINGER  FMW:[id number] DOB: 04-24-1971 DOA: 02/10/2024 PCP: Vicky Berg, MD   Brief Narrative:    Monique Nguyen  is a 53 y.o. female, with a history of alcohol  induced chronic pancreatitis, failure to thrive, pancreatic pseudocyst, anxiety/depression, and chronic abdominal pain, urinary retention, no further alcohol  abuse, presents to ED due to concern of buttocks abscess, patient has some nausea, but no vomiting, he is she is having chronic abdominal pain patient in ED reports she has a concern of pilonidal cyst, no fever, no discharge, reports been bothering her for roughly a year, she was told to follow-up with the surgery as an outpatient but did not follow so far.. - In ED she was noted with no palpable abscess, erythema or warmth by ED physician, CT abdomen pelvis with no evidence of buttocks abscess, but was significant for bilateral hydronephrosis due to urinary retention, as well workup significant for severe hypokalemia 2.1, and hypophosphatemia, so [facility]  hospitalist consulted to admit.  Assessment & Plan:   Principal Problem:   Hypokalemia Active Problems:   Chronic pancreatitis (HCC)   Chronic abdominal pain  Assessment and Plan:   Hypokalemia-improving - Likely secondary to ongoing diarrhea -Potassium significantly low at 2.1, will replace aggressively, monitor closely on telemetry/stepdown - Received 60 mEq of IV potassium, and 40 p.o. once, will repeat another 40 p.o. and recheck level   Urinary retention Bilateral hydronephrosis>> due to urinary retention - Reports history of allergic surgery in the past, Foley catheter inserted in ED with 1500 cc output so far (still draining). - Noted to have history of interstitial cystitis.  Plan to do voiding trial today if ambulating well and then determine if Foley needs to be reinserted prior to discharge tomorrow.   Hypophosphatemia-resolved -Replacing, recheck in a.m.   Chronic  pancreatitis -No acute finding on imaging, no nausea, no vomiting   Chronic abdominal pain -Due to above, as tolerated urinary retention, continue with as needed pain meds   Chronic abdominal pain -Multifactorial including constipation, urinary retention, acute on chronic pancreatitis and pseudocysts -Continue morphine  IV prn    Anemia of chronic Disease-stable - Anemia panel within normal limits   THC use -She endorses THC use, confirmed on UDS   Diabetes mellitus - Patient reports she is on metformin , will hold, will keep on insulin  sliding scale and check A1c   Hypertension -Blood pressure elevated, not on any home medication, will start on Norvasc  and as needed hydralazine .   DVT prophylaxis:Heparin  Code Status: Full Family Communication: None at bedside Disposition Plan:  Status is: Observation The patient remains OBS appropriate and will d/c before 2 midnights.  Consultants:  None  Procedures:  None  Antimicrobials:  None   Subjective: Patient seen and evaluated today with no new acute complaints or concerns. No acute concerns or events noted overnight.  Objective: Vitals:   02/10/24 2130 02/10/24 2251 02/11/24 0254 02/11/24 0648  BP: (!) 157/84 (!) 174/100 (!) 143/95 (!) 149/88  Pulse: (!) 103 (!) 107 (!) 105 (!) 106  Resp: 12 20  14   Temp:  98.4 F (36.9 C) 97.8 F (36.6 C) 98.6 F (37 C)  TempSrc:  Oral Oral Oral  SpO2: 97% 100% 100% 100%  Weight:      Height:        Intake/Output Summary (Last 24 hours) at 02/11/2024 0659 Last data filed at 02/11/2024 0549 Gross per 24 hour  Intake 920 ml  Output 9825 ml  Net -8905 ml  Filed Weights   02/10/24 1202  Weight: 43.5 kg    Examination:  General exam: Appears calm and comfortable  Respiratory system: Clear to auscultation. Respiratory effort normal. Cardiovascular system: S1 & S2 heard, RRR.  Gastrointestinal system: Abdomen is soft Central nervous system: Alert and awake Extremities: No  edema Skin: No significant lesions noted Psychiatry: Flat affect.    Data Reviewed: I have personally reviewed following labs and imaging studies  CBC: Recent Labs  Lab 02/10/24 1237 02/11/24 0429  WBC 4.3 4.4  NEUTROABS 3.1  --   HGB 9.7* 9.4*  HCT 29.8* 28.4*  MCV 90.6 89.3  PLT 244 275   Basic Metabolic Panel: Recent Labs  Lab 02/10/24 1237 02/10/24 1440 02/10/24 2055 02/11/24 0429  NA 143  --   --  142  K 2.1*  --  2.7* 3.3*  CL 99  --   --  104  CO2 28  --   --  28  GLUCOSE 141*  --   --  133*  BUN 14  --   --  7  CREATININE 0.97  --   --  0.68  CALCIUM  9.1  --   --  8.6*  MG  --  1.9  --   --   PHOS  --   --   --  3.4   GFR: Estimated Creatinine Clearance: 55.8 mL/min (by C-G formula based on SCr of 0.68 mg/dL). Liver Function Tests: Recent Labs  Lab 02/10/24 1237  AST 22  ALT 14  ALKPHOS 59  BILITOT 0.7  PROT 7.6  ALBUMIN 4.3   Recent Labs  Lab 02/10/24 1237  LIPASE 23   No results for input(s): AMMONIA in the last 168 hours. Coagulation Profile: No results for input(s): INR, PROTIME in the last 168 hours. Cardiac Enzymes: No results for input(s): CKTOTAL, CKMB, CKMBINDEX, TROPONINI in the last 168 hours. BNP (last 3 results) No results for input(s): PROBNP in the last 8760 hours. HbA1C: No results for input(s): HGBA1C in the last 72 hours. CBG: Recent Labs  Lab 02/10/24 2149  GLUCAP 111*   Lipid Profile: No results for input(s): CHOL, HDL, LDLCALC, TRIG, CHOLHDL, LDLDIRECT in the last 72 hours. Thyroid  Function Tests: No results for input(s): TSH, T4TOTAL, FREET4, T3FREE, THYROIDAB in the last 72 hours. Anemia Panel: Recent Labs    02/10/24 2055  VITAMINB12 230  FOLATE >20.0  FERRITIN 5*  TIBC 338  IRON 37  RETICCTPCT 1.3   Sepsis Labs: Recent Labs  Lab 02/10/24 1440  LATICACIDVEN 0.9    Recent Results (from the past 240 hours)  Culture, blood (routine x 2)     Status: None  (Preliminary result)   Collection Time: 02/10/24  3:37 PM   Specimen: Left Antecubital; Blood  Result Value Ref Range Status   Specimen Description   Final    LEFT ANTECUBITAL BOTTLES DRAWN AEROBIC AND ANAEROBIC   Special Requests   Final    Blood Culture adequate volume Performed at [facility], [address] [place], [place] [address]    Culture PENDING  Incomplete   Report Status PENDING  Incomplete         Radiology Studies: CT ABDOMEN PELVIS W CONTRAST Result Date: 02/10/2024 CLINICAL DATA:  Abdominal pain, acute abdominal, nonlocalized. Abscess on buttocks. Nausea and back pain. EXAM: CT ABDOMEN AND PELVIS WITH CONTRAST TECHNIQUE: Multidetector CT imaging of the abdomen and pelvis was performed using the standard protocol following bolus administration of intravenous contrast. RADIATION DOSE  REDUCTION: This exam was performed according to the departmental dose-optimization program which includes automated exposure control, adjustment of the mA and/or kV according to patient size and/or use of iterative reconstruction technique. CONTRAST:  80mL OMNIPAQUE  IOHEXOL  300 MG/ML  SOLN COMPARISON:  01/25/2022. FINDINGS: Lower chest: No acute abnormality. Hepatobiliary: No focal liver abnormality is seen. Fatty infiltration of the liver is noted no gallstones, gallbladder wall thickening, or biliary dilatation. Pancreas: Calcifications are noted in the pancreatic head. No pancreatic ductal dilatation or surrounding inflammatory changes. There is atrophy of the pancreatic tail. Spleen: Normal in size without focal abnormality. Adrenals/Urinary Tract: The adrenal glands are within normal limits. The kidneys enhance symmetrically. No renal calculus bilaterally. There is moderate hydroureteronephrosis bilaterally without evidence of obstructing calculus. The urinary bladder is markedly distended. Stomach/Bowel: The stomach is within normal limits. No bowel obstruction, free air, or pneumatosis is seen.  Appendix appears normal. Vascular/Lymphatic: Aortic atherosclerosis. No enlarged abdominal or pelvic lymph nodes. Reproductive: The uterus is within normal limits. No adnexal mass is seen. Other: No free fluid in the pelvis.  No obvious abscess is seen. Musculoskeletal: No acute osseous abnormality. IMPRESSION: 1. No evidence of abscess. 2. Markedly distended urinary bladder with moderate hydroureteronephrosis bilaterally suggesting bladder outlet obstruction. 3. And Paddock steatosis. 4. Sequela of chronic pancreatitis. 5. Aortic atherosclerosis. Electronically Signed   By: Leita Birmingham M.D.   On: 02/10/2024 17:13        Scheduled Meds:  amLODipine   5 mg Oral Daily   atorvastatin   20 mg Oral Daily   heparin   5,000 Units Subcutaneous Q8H   insulin  aspart  0-5 Units Subcutaneous QHS   insulin  aspart  0-9 Units Subcutaneous TID WC   pantoprazole   40 mg Oral Daily   Phospha 250 Neutral  500 mg Oral Q4H   sertraline   100 mg Oral Daily   Continuous Infusions:  lactated ringers  100 mL/hr at 02/11/24 0126     LOS: 0 days    Time spent: 55 minutes    Arlita Buffkin D Racine Erby, DO [facility]  If 7PM-7AM, please contact night-coverage [email or web address] 02/11/2024, 6:59 AM

## 2024-02-12 ENCOUNTER — Inpatient Hospital Stay (HOSPITAL_COMMUNITY)

## 2024-02-12 ENCOUNTER — Other Ambulatory Visit (HOSPITAL_COMMUNITY): Payer: Self-pay | Admitting: *Deleted

## 2024-02-12 DIAGNOSIS — E876 Hypokalemia: Secondary | ICD-10-CM | POA: Diagnosis not present

## 2024-02-12 DIAGNOSIS — R0600 Dyspnea, unspecified: Secondary | ICD-10-CM

## 2024-02-12 LAB — GLUCOSE, CAPILLARY
Glucose-Capillary: 135 mg/dL — ABNORMAL HIGH (ref 70–99)
Glucose-Capillary: 159 mg/dL — ABNORMAL HIGH (ref 70–99)
Glucose-Capillary: 161 mg/dL — ABNORMAL HIGH (ref 70–99)
Glucose-Capillary: 160 mg/dL — ABNORMAL HIGH (ref 70–99)

## 2024-02-12 LAB — BASIC METABOLIC PANEL WITH GFR
Anion gap: 13 (ref 5–15)
BUN: 8 mg/dL (ref 6–20)
CO2: 26 mmol/L (ref 22–32)
Calcium: 8.3 mg/dL — ABNORMAL LOW (ref 8.9–10.3)
Chloride: 100 mmol/L (ref 98–111)
Creatinine, Ser: 0.84 mg/dL (ref 0.44–1.00)
GFR, Estimated: >60 mL/min (ref >60)
Glucose, Bld: 153 mg/dL — ABNORMAL HIGH (ref 70–99)
Potassium: 3.5 mmol/L (ref 3.5–5.1)
Sodium: 139 mmol/L (ref 135–145)

## 2024-02-12 LAB — CBC
HCT: 28.5 % — ABNORMAL LOW (ref 36.0–46.0)
Hemoglobin: 9.4 g/dL — ABNORMAL LOW (ref 12.0–15.0)
MCH: 29.8 pg (ref 26.0–34.0)
MCHC: 33 g/dL (ref 30.0–36.0)
MCV: 90.5 fL (ref 80.0–100.0)
Platelets: 251 K/uL (ref 150–400)
RBC: 3.15 MIL/uL — ABNORMAL LOW (ref 3.87–5.11)
RDW: 12.6 % (ref 11.5–15.5)
WBC: 3.5 K/uL — ABNORMAL LOW (ref 4.0–10.5)
nRBC: 0 % (ref 0.0–0.2)

## 2024-02-12 LAB — ECHOCARDIOGRAM COMPLETE
Height: 61 in
S' Lateral: 2.5 cm
Weight: 1536 [oz_av]

## 2024-02-12 LAB — MAGNESIUM: Magnesium: 1.5 mg/dL — ABNORMAL LOW (ref 1.7–2.4)

## 2024-02-12 MED ORDER — CHLORHEXIDINE GLUCONATE CLOTH 2 % EX PADS
6.0000 | MEDICATED_PAD | Freq: Every day | CUTANEOUS | Status: DC
  Administered 2024-02-12 – 2024-02-15 (×4): 6 via TOPICAL

## 2024-02-12 MED ORDER — HYDROXYZINE HCL 25 MG PO TABS
25.0000 mg | ORAL_TABLET | Freq: Three times a day (TID) | ORAL | Status: DC | PRN
  Administered 2024-02-12 – 2024-02-15 (×10): 25 mg via ORAL
  Filled 2024-02-12 (×10): qty 1

## 2024-02-12 MED ORDER — POTASSIUM CHLORIDE CRYS ER 20 MEQ PO TBCR
40.0000 meq | EXTENDED_RELEASE_TABLET | Freq: Once | ORAL | Status: AC
  Administered 2024-02-12: 40 meq via ORAL
  Filled 2024-02-12: qty 2

## 2024-02-12 MED ORDER — MAGNESIUM SULFATE 2 GM/50ML IV SOLN
2.0000 g | Freq: Once | INTRAVENOUS | Status: AC
  Administered 2024-02-12: 2 g via INTRAVENOUS
  Filled 2024-02-12: qty 50

## 2024-02-12 MED ORDER — LACTATED RINGERS IV BOLUS
1000.0000 mL | Freq: Once | INTRAVENOUS | Status: AC
  Administered 2024-02-12: 1000 mL via INTRAVENOUS

## 2024-02-12 NOTE — Progress Notes (Signed)
 Mobility Specialist Progress Note:    02/12/24 1045  Mobility  Activity Ambulated with assistance  Level of Assistance Modified independent, requires aide device or extra time  Assistive Device None  Distance Ambulated (ft) 120 ft  Range of Motion/Exercises Active;All extremities  Activity Response Tolerated well  Mobility Referral Yes  Mobility visit 1 Mobility  Mobility Specialist Start Time (ACUTE ONLY) 1045  Mobility Specialist Stop Time (ACUTE ONLY) 1105  Mobility Specialist Time Calculation (min) (ACUTE ONLY) 20 min   Pt received in bed, agreeable to mobility. ModI to stand and ambulate with no AD. Tolerated well, HR 114 and SpO2 100% on RA at rest. During ambulation HR 144 and SpO2 98%-100% on RA. Returned supine, all needs met.  Tabor Bartram Mobility Specialist Please contact via Special educational needs teacher or  Rehab office at [phone]

## 2024-02-12 NOTE — Progress Notes (Signed)
  Echocardiogram 2D Echocardiogram has been performed.  Monique Nguyen 02/12/2024, 3:47 PM

## 2024-02-12 NOTE — Plan of Care (Signed)
  Problem: Activity: Goal: Risk for activity intolerance will decrease Outcome: Not Progressing  ++ Pt with tachycardia (ST 110-140) especially with exertion. Receiving IVF bolus at this time per order   Problem: Elimination: Goal: Will not experience complications related to urinary retention Outcome: Not Progressing  ++ Pt unable to void after foley cath removed this am. Foley cath replaced this evening.   Problem: Education: Goal: Knowledge of General Education information will improve Description: Including pain rating scale, medication(s)/side effects and non-pharmacologic comfort measures Outcome: Progressing   Problem: Health Behavior/Discharge Planning: Goal: Ability to manage health-related needs will improve Outcome: Progressing   Problem: Clinical Measurements: Goal: Ability to maintain clinical measurements within normal limits will improve Outcome: Progressing Goal: Will remain free from infection Outcome: Progressing Goal: Diagnostic test results will improve Outcome: Progressing Goal: Respiratory complications will improve Outcome: Progressing Goal: Cardiovascular complication will be avoided Outcome: Progressing   Problem: Nutrition: Goal: Adequate nutrition will be maintained Outcome: Progressing   Problem: Coping: Goal: Level of anxiety will decrease Outcome: Progressing   Problem: Elimination: Goal: Will not experience complications related to bowel motility Outcome: Progressing   Problem: Pain Managment: Goal: General experience of comfort will improve and/or be controlled Outcome: Progressing   Problem: Safety: Goal: Ability to remain free from injury will improve Outcome: Progressing   Problem: Skin Integrity: Goal: Risk for impaired skin integrity will decrease Outcome: Progressing   Problem: Education: Goal: Ability to describe self-care measures that may prevent or decrease complications (Diabetes Survival Skills Education) will  improve Outcome: Progressing Goal: Individualized Educational Video(s) Outcome: Progressing   Problem: Coping: Goal: Ability to adjust to condition or change in health will improve Outcome: Progressing   Problem: Fluid Volume: Goal: Ability to maintain a balanced intake and output will improve Outcome: Progressing   Problem: Health Behavior/Discharge Planning: Goal: Ability to identify and utilize available resources and services will improve Outcome: Progressing Goal: Ability to manage health-related needs will improve Outcome: Progressing   Problem: Metabolic: Goal: Ability to maintain appropriate glucose levels will improve Outcome: Progressing   Problem: Nutritional: Goal: Maintenance of adequate nutrition will improve Outcome: Progressing Goal: Progress toward achieving an optimal weight will improve Outcome: Progressing   Problem: Skin Integrity: Goal: Risk for impaired skin integrity will decrease Outcome: Progressing   Problem: Tissue Perfusion: Goal: Adequacy of tissue perfusion will improve Outcome: Progressing

## 2024-02-12 NOTE — Plan of Care (Signed)

## 2024-02-12 NOTE — Progress Notes (Signed)
 Foley cath inserted, immediate return of cleary light yellow urine, total 1400 drained within 5 minutes. Pt states relief, lower abd no longer tight or distended and no longer tender to palpation. IVF bolus infusing per order, pt's HR remains 105-110/min with rest. No c/o pain or SOB, but pt did place nasal cannula 2 lpm [place] back into nares, states, It just makes me feel better.

## 2024-02-12 NOTE — Progress Notes (Signed)
 Pt has been unable to urinate since foley cath removed this am, despite multiple trips to toilet due to urge. Bladder scan revealed of urine, lower abd distended and painful to palpation. MD Maree updated, orders received to reinsert foley catheter. Pt notified and agreeable.

## 2024-02-12 NOTE — Progress Notes (Signed)
 PROGRESS NOTE    MAYELA BULLARD  FMW:[id number] DOB: 10/18/1970 DOA: 02/10/2024 PCP: Vicky Berg, MD   Brief Narrative:    Monique Nguyen  is a 53 y.o. female, with a history of alcohol  induced chronic pancreatitis, failure to thrive, pancreatic pseudocyst, anxiety/depression, and chronic abdominal pain, urinary retention, no further alcohol  abuse, presents to ED due to concern of buttocks abscess, patient has some nausea, but no vomiting, he is she is having chronic abdominal pain patient in ED reports she has a concern of pilonidal cyst, no fever, no discharge, reports been bothering her for roughly a year, she was told to follow-up with the surgery as an outpatient but did not follow so far.. - In ED she was noted with no palpable abscess, erythema or warmth by ED physician, CT abdomen pelvis with no evidence of buttocks abscess, but was significant for bilateral hydronephrosis due to urinary retention, as well workup significant for severe hypokalemia 2.1, and hypophosphatemia, so [facility]  hospitalist consulted to admit.  Patient continues to have significant tachycardia and is undergoing voiding trial today.  2D echocardiogram ordered and pending.  Assessment & Plan:   Principal Problem:   Hypokalemia Active Problems:   Chronic pancreatitis (HCC)   Chronic abdominal pain  Assessment and Plan:   Hypokalemia-resolved - Likely secondary to ongoing diarrhea -Potassium significantly low at 2.1, will replace aggressively, monitor closely on telemetry/stepdown - Continue to monitor  Sinus tachycardia -Appears to be related to volume depletion with significant postobstructive diuresis -Check 2D echocardiogram -Bolus 1 L fluid and continue to bolus as needed, if not responding, plan to check D-dimer and assess for PE   Urinary retention Bilateral hydronephrosis>> due to urinary retention - Reports history of allergic surgery in the past, Foley catheter inserted in ED with 1500 cc  output so far (still draining). - Noted to have history of interstitial cystitis.  Plan to do voiding trial today if ambulating well and then determine if Foley needs to be reinserted prior to discharge tomorrow.   Hypophosphatemia-resolved -Replacing, recheck in a.m.   Chronic pancreatitis -No acute finding on imaging, no nausea, no vomiting   Chronic abdominal pain -Due to above, as tolerated urinary retention, continue with as needed pain meds   Chronic abdominal pain -Multifactorial including constipation, urinary retention, acute on chronic pancreatitis and pseudocysts -Continue morphine  IV prn    Anemia of chronic Disease-stable - Anemia panel within normal limits   THC use -She endorses THC use, confirmed on UDS   Diabetes mellitus - Patient reports she is on metformin , will hold, will keep on insulin  sliding scale and check A1c   Hypertension -Blood pressure elevated, not on any home medication, will start on Norvasc  and as needed hydralazine .   DVT prophylaxis:Heparin  Code Status: Full Family Communication: None at bedside Disposition Plan:  Status is: Inpatient Remains inpatient appropriate because: Need for IV fluid.   Consultants:  None  Procedures:  None  Antimicrobials:  None   Subjective: Patient seen and evaluated today with no new acute complaints or concerns. No acute concerns or events noted overnight.  She continues to have some sinus tachycardia today.  Objective: Vitals:   02/12/24 0526 02/12/24 0957 02/12/24 1120 02/12/24 1426  BP: 125/79 (!) 148/78 (!) 150/85 130/87  Pulse: (!) 109 (!) 110 (!) 118 (!) 124  Resp: 20     Temp: 98.5 F (36.9 C) 98.6 F (37 C) 97.7 F (36.5 C) 98.6 F (37 C)  TempSrc: Oral Oral Oral Oral  SpO2: 100% 100% 100% 100%  Weight:      Height:        Intake/Output Summary (Last 24 hours) at 02/12/2024 1541 Last data filed at 02/12/2024 1508 Gross per 24 hour  Intake 240 ml  Output 1300 ml  Net -1060 ml    Filed Weights   02/10/24 1202  Weight: 43.5 kg    Examination:  General exam: Appears calm and comfortable  Respiratory system: Clear to auscultation. Respiratory effort normal. Cardiovascular system: S1 & S2 heard, RRR.  Tachycardic. Gastrointestinal system: Abdomen is soft Central nervous system: Alert and awake Extremities: No edema Skin: No significant lesions noted Psychiatry: Flat affect. Foley with clear, yellow urine output    Data Reviewed: I have personally reviewed following labs and imaging studies  CBC: Recent Labs  Lab 02/10/24 1237 02/11/24 0429 02/12/24 0450  WBC 4.3 4.4 3.5*  NEUTROABS 3.1  --   --   HGB 9.7* 9.4* 9.4*  HCT 29.8* 28.4* 28.5*  MCV 90.6 89.3 90.5  PLT 244 275 251   Basic Metabolic Panel: Recent Labs  Lab 02/10/24 1237 02/10/24 1440 02/10/24 2055 02/11/24 0429 02/12/24 0450  NA 143  --   --  142 139  K 2.1*  --  2.7* 3.3* 3.5  CL 99  --   --  104 100  CO2 28  --   --  28 26  GLUCOSE 141*  --   --  133* 153*  BUN 14  --   --  7 8  CREATININE 0.97  --   --  0.68 0.84  CALCIUM  9.1  --   --  8.6* 8.3*  MG  --  1.9  --   --  1.5*  PHOS  --   --   --  3.4  --    GFR: Estimated Creatinine Clearance: 53.2 mL/min (by C-G formula based on SCr of 0.84 mg/dL). Liver Function Tests: Recent Labs  Lab 02/10/24 1237  AST 22  ALT 14  ALKPHOS 59  BILITOT 0.7  PROT 7.6  ALBUMIN 4.3   Recent Labs  Lab 02/10/24 1237  LIPASE 23   No results for input(s): AMMONIA in the last 168 hours. Coagulation Profile: No results for input(s): INR, PROTIME in the last 168 hours. Cardiac Enzymes: No results for input(s): CKTOTAL, CKMB, CKMBINDEX, TROPONINI in the last 168 hours. BNP (last 3 results) No results for input(s): PROBNP in the last 8760 hours. HbA1C: No results for input(s): HGBA1C in the last 72 hours. CBG: Recent Labs  Lab 02/11/24 1126 02/11/24 1604 02/11/24 2016 02/12/24 0733 02/12/24 1118  GLUCAP  154* 120* 166* 135* 159*   Lipid Profile: No results for input(s): CHOL, HDL, LDLCALC, TRIG, CHOLHDL, LDLDIRECT in the last 72 hours. Thyroid  Function Tests: No results for input(s): TSH, T4TOTAL, FREET4, T3FREE, THYROIDAB in the last 72 hours. Anemia Panel: Recent Labs    02/10/24 2055  VITAMINB12 230  FOLATE >20.0  FERRITIN 5*  TIBC 338  IRON 37  RETICCTPCT 1.3   Sepsis Labs: Recent Labs  Lab 02/10/24 1440  LATICACIDVEN 0.9    Recent Results (from the past 240 hours)  Culture, blood (routine x 2)     Status: None (Preliminary result)   Collection Time: 02/10/24  3:37 PM   Specimen: Left Antecubital; Blood  Result Value Ref Range Status   Specimen Description   Final    LEFT ANTECUBITAL BOTTLES DRAWN AEROBIC AND ANAEROBIC   Special Requests   Final  Blood Culture adequate volume Performed at [facility], [address] [place], [place] [address]    Culture PENDING  Incomplete   Report Status PENDING  Incomplete         Radiology Studies: DG CHEST PORT 1 VIEW Result Date: 02/11/2024 CLINICAL DATA:  Hypoxemia. EXAM: PORTABLE CHEST 1 VIEW COMPARISON:  11/11/2018. FINDINGS: Trachea is midline. Heart size normal. Lungs are hyperinflated but clear. No pleural fluid. IMPRESSION: Hyperinflation without acute finding. Electronically Signed   By: Newell Eke M.D.   On: 02/11/2024 11:50   CT ABDOMEN PELVIS W CONTRAST Result Date: 02/10/2024 CLINICAL DATA:  Abdominal pain, acute abdominal, nonlocalized. Abscess on buttocks. Nausea and back pain. EXAM: CT ABDOMEN AND PELVIS WITH CONTRAST TECHNIQUE: Multidetector CT imaging of the abdomen and pelvis was performed using the standard protocol following bolus administration of intravenous contrast. RADIATION DOSE REDUCTION: This exam was performed according to the departmental dose-optimization program which includes automated exposure control, adjustment of the mA and/or kV according to patient size  and/or use of iterative reconstruction technique. CONTRAST:  80mL OMNIPAQUE  IOHEXOL  300 MG/ML  SOLN COMPARISON:  01/25/2022. FINDINGS: Lower chest: No acute abnormality. Hepatobiliary: No focal liver abnormality is seen. Fatty infiltration of the liver is noted no gallstones, gallbladder wall thickening, or biliary dilatation. Pancreas: Calcifications are noted in the pancreatic head. No pancreatic ductal dilatation or surrounding inflammatory changes. There is atrophy of the pancreatic tail. Spleen: Normal in size without focal abnormality. Adrenals/Urinary Tract: The adrenal glands are within normal limits. The kidneys enhance symmetrically. No renal calculus bilaterally. There is moderate hydroureteronephrosis bilaterally without evidence of obstructing calculus. The urinary bladder is markedly distended. Stomach/Bowel: The stomach is within normal limits. No bowel obstruction, free air, or pneumatosis is seen. Appendix appears normal. Vascular/Lymphatic: Aortic atherosclerosis. No enlarged abdominal or pelvic lymph nodes. Reproductive: The uterus is within normal limits. No adnexal mass is seen. Other: No free fluid in the pelvis.  No obvious abscess is seen. Musculoskeletal: No acute osseous abnormality. IMPRESSION: 1. No evidence of abscess. 2. Markedly distended urinary bladder with moderate hydroureteronephrosis bilaterally suggesting bladder outlet obstruction. 3. And [place] steatosis. 4. Sequela of chronic pancreatitis. 5. Aortic atherosclerosis. Electronically Signed   By: Leita Birmingham M.D.   On: 02/10/2024 17:13        Scheduled Meds:  amLODipine   5 mg Oral Daily   atorvastatin   20 mg Oral Daily   heparin   5,000 Units Subcutaneous Q8H   insulin  aspart  0-5 Units Subcutaneous QHS   insulin  aspart  0-9 Units Subcutaneous TID WC   tamsulosin   0.4 mg Oral QPC supper   Continuous Infusions:  lactated ringers        LOS: 1 day    Time spent: 55 minutes    Ronna Herskowitz D Shantanu Strauch, DO [facility]  If 7PM-7AM, please contact night-coverage [email or web address] 02/12/2024, 3:41 PM

## 2024-02-13 DIAGNOSIS — E876 Hypokalemia: Secondary | ICD-10-CM | POA: Diagnosis not present

## 2024-02-13 LAB — GLUCOSE, CAPILLARY
Glucose-Capillary: 151 mg/dL — ABNORMAL HIGH (ref 70–99)
Glucose-Capillary: 251 mg/dL — ABNORMAL HIGH (ref 70–99)
Glucose-Capillary: 205 mg/dL — ABNORMAL HIGH (ref 70–99)
Glucose-Capillary: 134 mg/dL — ABNORMAL HIGH (ref 70–99)

## 2024-02-13 LAB — BASIC METABOLIC PANEL WITH GFR
Anion gap: 16 — ABNORMAL HIGH (ref 5–15)
BUN: 6 mg/dL (ref 6–20)
CO2: 26 mmol/L (ref 22–32)
Calcium: 8.6 mg/dL — ABNORMAL LOW (ref 8.9–10.3)
Chloride: 97 mmol/L — ABNORMAL LOW (ref 98–111)
Creatinine, Ser: 0.8 mg/dL (ref 0.44–1.00)
GFR, Estimated: >60 mL/min (ref >60)
Glucose, Bld: 156 mg/dL — ABNORMAL HIGH (ref 70–99)
Potassium: 3.6 mmol/L (ref 3.5–5.1)
Sodium: 139 mmol/L (ref 135–145)

## 2024-02-13 LAB — HEMOGLOBIN A1C
Hgb A1c MFr Bld: 6 % — ABNORMAL HIGH (ref 4.8–5.6)
Mean Plasma Glucose: 126 mg/dL

## 2024-02-13 LAB — MAGNESIUM: Magnesium: 1.8 mg/dL (ref 1.7–2.4)

## 2024-02-13 LAB — D-DIMER, QUANTITATIVE: D-Dimer, Quant: <0.27 ug{FEU}/mL (ref 0.00–0.50)

## 2024-02-13 MED ORDER — LACTATED RINGERS IV BOLUS
1000.0000 mL | Freq: Once | INTRAVENOUS | Status: AC
  Administered 2024-02-13: 1000 mL via INTRAVENOUS

## 2024-02-13 MED ORDER — AMLODIPINE BESYLATE 5 MG PO TABS
10.0000 mg | ORAL_TABLET | Freq: Every day | ORAL | Status: DC
  Administered 2024-02-14 – 2024-02-15 (×2): 10 mg via ORAL
  Filled 2024-02-13 (×2): qty 2

## 2024-02-13 MED ORDER — LACTATED RINGERS IV SOLN: INTRAVENOUS | Status: AC

## 2024-02-13 NOTE — Plan of Care (Signed)
  Problem: Education: Goal: Knowledge of General Education information will improve Description Including pain rating scale, medication(s)/side effects and non-pharmacologic comfort measures Outcome: Progressing   Problem: Health Behavior/Discharge Planning: Goal: Ability to manage health-related needs will improve Outcome: Progressing   Problem: Clinical Measurements: Goal: Ability to maintain clinical measurements within normal limits will improve Outcome: Progressing Goal: Will remain free from infection Outcome: Progressing Goal: Diagnostic test results will improve Outcome: Progressing   Problem: Nutrition: Goal: Adequate nutrition will be maintained Outcome: Progressing   Problem: Coping: Goal: Level of anxiety will decrease Outcome: Progressing   

## 2024-02-13 NOTE — Progress Notes (Signed)
 PROGRESS NOTE    RIELLE SCHLAUCH  FMW:[id number] DOB: [birth year] DOA: 02/10/2024 PCP: Vicky Berg, MD   Brief Narrative:    Nica Friske  is a 53 y.o. female, with a history of alcohol  induced chronic pancreatitis, failure to thrive, pancreatic pseudocyst, anxiety/depression, and chronic abdominal pain, urinary retention, no further alcohol  abuse, presents to ED due to concern of buttocks abscess, patient has some nausea, but no vomiting, he is she is having chronic abdominal pain patient in ED reports she has a concern of pilonidal cyst, no fever, no discharge, reports been bothering her for roughly a year, she was told to follow-up with the surgery as an outpatient but did not follow so far.. - In ED she was noted with no palpable abscess, erythema or warmth by ED physician, CT abdomen pelvis with no evidence of buttocks abscess, but was significant for bilateral hydronephrosis due to urinary retention, as well workup significant for severe hypokalemia 2.1, and hypophosphatemia, so [facility] consulted to admit.  Patient continues to have significant tachycardia and is undergoing voiding trial today.  2D echocardiogram with no acute findings noted.  It appears that she is quite dehydrated and is requiring multiple fluid boluses as well as high rate of IV infusion to help with dehydration related tachycardia.  Assessment & Plan:   Principal Problem:   Hypokalemia Active Problems:   Chronic pancreatitis (HCC)   Chronic abdominal pain  Assessment and Plan:   Hypokalemia-resolved - Likely secondary to ongoing diarrhea -Potassium significantly low at 2.1, will replace aggressively, monitor closely on telemetry/stepdown - Continue to monitor  Sinus tachycardia-persistent -Appears to be related to volume depletion with significant postobstructive diuresis -D-dimer negative - 2D echocardiogram with no significant findings - Continued to bolus with 3 L given today and  started on aggressive IV infusion.  Patient noted to have net negative almost 11 L with postobstructive diuresis   Urinary retention Bilateral hydronephrosis>> due to urinary retention - Reports history of allergic surgery in the past, Foley catheter inserted in ED with 1500 cc output so far (still draining). - Failed voiding trial and will need urology outpatient follow-up   Chronic pancreatitis -No acute finding on imaging, no nausea, no vomiting   Chronic abdominal pain -Due to above, as tolerated urinary retention, continue with as needed pain meds   Chronic abdominal pain -Multifactorial including constipation, urinary retention, acute on chronic pancreatitis and pseudocysts -Continue morphine  IV prn    Anemia of chronic Disease-stable - Anemia panel within normal limits   THC use -She endorses THC use, confirmed on UDS   Diabetes mellitus - Patient reports she is on metformin , will hold, will keep on insulin  sliding scale and check A1c   Hypertension -Blood pressure elevated, not on any home medication, will start on Norvasc  and as needed hydralazine . -Increase amlodipine  dose for 9/3 to 10 mg   DVT prophylaxis:Heparin  Code Status: Full Family Communication: None at bedside Disposition Plan:  Status is: Inpatient Remains inpatient appropriate because: Need for IV fluid.   Consultants:  None  Procedures:  None  Antimicrobials:  None   Subjective: Patient seen and evaluated today with no new acute complaints or concerns. No acute concerns or events noted overnight.  She continues to have some sinus tachycardia today and can feel her heart racing when she exerts herself.  She failed voiding trial yesterday.  Objective: Vitals:   02/13/24 0156 02/13/24 0542 02/13/24 0838 02/13/24 1321  BP: 130/76 125/81 (!) 142/88 (!) 156/88  Pulse:  100 (!) 106 (!) 113 (!) 107  Resp: 16 18 20    Temp: 98.6 F (37 C) 98.3 F (36.8 C) 98 F (36.7 C) (!) 97.4 F (36.3 C)   TempSrc: Oral Oral Oral Oral  SpO2: 97% 96% 100% 100%  Weight:      Height:        Intake/Output Summary (Last 24 hours) at 02/13/2024 1519 Last data filed at 02/13/2024 1504 Gross per 24 hour  Intake 1207.46 ml  Output 4275 ml  Net -3067.54 ml   Filed Weights   02/10/24 1202  Weight: 43.5 kg    Examination:  General exam: Appears calm and comfortable  Respiratory system: Clear to auscultation. Respiratory effort normal. Cardiovascular system: S1 & S2 heard, RRR.  Tachycardic. Gastrointestinal system: Abdomen is soft Central nervous system: Alert and awake Extremities: No edema Skin: No significant lesions noted Psychiatry: Flat affect. Foley with clear, yellow urine output    Data Reviewed: I have personally reviewed following labs and imaging studies  CBC: Recent Labs  Lab 02/10/24 1237 02/11/24 0429 02/12/24 0450  WBC 4.3 4.4 3.5*  NEUTROABS 3.1  --   --   HGB 9.7* 9.4* 9.4*  HCT 29.8* 28.4* 28.5*  MCV 90.6 89.3 90.5  PLT 244 275 251   Basic Metabolic Panel: Recent Labs  Lab 02/10/24 1237 02/10/24 1440 02/10/24 2055 02/11/24 0429 02/12/24 0450 02/13/24 0750  NA 143  --   --  142 139 139  K 2.1*  --  2.7* 3.3* 3.5 3.6  CL 99  --   --  104 100 97*  CO2 28  --   --  28 26 26   GLUCOSE 141*  --   --  133* 153* 156*  BUN 14  --   --  7 8 6   CREATININE 0.97  --   --  0.68 0.84 0.80  CALCIUM  9.1  --   --  8.6* 8.3* 8.6*  MG  --  1.9  --   --  1.5* 1.8  PHOS  --   --   --  3.4  --   --    GFR: Estimated Creatinine Clearance: 55.8 mL/min (by C-G formula based on SCr of 0.8 mg/dL). Liver Function Tests: Recent Labs  Lab 02/10/24 1237  AST 22  ALT 14  ALKPHOS 59  BILITOT 0.7  PROT 7.6  ALBUMIN 4.3   Recent Labs  Lab 02/10/24 1237  LIPASE 23   No results for input(s): AMMONIA in the last 168 hours. Coagulation Profile: No results for input(s): INR, PROTIME in the last 168 hours. Cardiac Enzymes: No results for input(s): CKTOTAL,  CKMB, CKMBINDEX, TROPONINI in the last 168 hours. BNP (last 3 results) No results for input(s): PROBNP in the last 8760 hours. HbA1C: Recent Labs    02/10/24 2055  HGBA1C 6.0*   CBG: Recent Labs  Lab 02/12/24 1118 02/12/24 1625 02/12/24 2140 02/13/24 0759 02/13/24 1139  GLUCAP 159* 161* 160* 151* 251*   Lipid Profile: No results for input(s): CHOL, HDL, LDLCALC, TRIG, CHOLHDL, LDLDIRECT in the last 72 hours. Thyroid  Function Tests: No results for input(s): TSH, T4TOTAL, FREET4, T3FREE, THYROIDAB in the last 72 hours. Anemia Panel: Recent Labs    02/10/24 2055  VITAMINB12 230  FOLATE >20.0  FERRITIN 5*  TIBC 338  IRON 37  RETICCTPCT 1.3   Sepsis Labs: Recent Labs  Lab 02/10/24 1440  LATICACIDVEN 0.9    Recent Results (from the past 240 hours)  Culture, blood (  routine x 2)     Status: None (Preliminary result)   Collection Time: 02/10/24  2:40 PM   Specimen: BLOOD RIGHT FOREARM  Result Value Ref Range Status   Specimen Description   Final    BLOOD RIGHT FOREARM BOTTLES DRAWN AEROBIC AND ANAEROBIC   Special Requests Blood Culture adequate volume  Final   Culture   Final    NO GROWTH 3 DAYS Performed at [facility], [address] [place], [place] [address]    Report Status PENDING  Incomplete  Culture, blood (routine x 2)     Status: None (Preliminary result)   Collection Time: 02/10/24  3:37 PM   Specimen: Left Antecubital; Blood  Result Value Ref Range Status   Specimen Description   Final    LEFT ANTECUBITAL BOTTLES DRAWN AEROBIC AND ANAEROBIC   Special Requests Blood Culture adequate volume  Final   Culture   Final    NO GROWTH 3 DAYS Performed at [facility], [address], [place], [place] [address]    Report Status PENDING  Incomplete         Radiology Studies: ECHOCARDIOGRAM COMPLETE Result Date: 02/12/2024    ECHOCARDIOGRAM REPORT   Patient Name:   TANAYA DUNIGAN Date of Exam: 02/12/2024 Medical Rec #:   [id number]         Height:       61.0 in Accession #:    [id number]        Weight:       96.0 lb Date of Birth:  1970/07/15          BSA:          1.383 m Patient Age:    53 years          BP:           130/87 mmHg Patient Gender: F                 HR:           114 bpm. Exam Location:  Inpatient Procedure: 2D Echo (Both Spectral and Color Flow Doppler were utilized during            procedure). Indications:    dyspnea  History:        Patient has prior history of Echocardiogram examinations, most                 recent 11/03/2018. Risk Factors:Current Smoker.  Sonographer:    Tinnie Barefoot RDCS Referring Phys: [id number] Marietta Sikkema D [facility]  Sonographer Comments: Image acquisition challenging due to respiratory motion. IMPRESSIONS  1. Left ventricular ejection fraction, by estimation, is 60 to 65%. The left ventricle has normal function. The left ventricle has no regional wall motion abnormalities. Left ventricular diastolic parameters were normal.  2. Right ventricular systolic function is normal. The right ventricular size is normal. Tricuspid regurgitation signal is inadequate for assessing PA pressure.  3. The mitral valve is normal in structure. Trivial mitral valve regurgitation. No evidence of mitral stenosis.  4. The aortic valve was not well visualized. Aortic valve regurgitation is not visualized. No aortic stenosis is present.  5. The inferior vena cava is normal in size with greater than 50% respiratory variability, suggesting right atrial pressure of 3 mmHg. FINDINGS  Left Ventricle: Left ventricular ejection fraction, by estimation, is 60 to 65%. The left ventricle has normal function. The left ventricle has no regional wall motion abnormalities. The left ventricular internal cavity size was small. There  is no left ventricular hypertrophy. Left ventricular diastolic parameters were normal. Right Ventricle: The right ventricular size is normal. No increase in right ventricular wall thickness. Right ventricular  systolic function is normal. Tricuspid regurgitation signal is inadequate for assessing PA pressure. Left Atrium: Left atrial size was normal in size. Right Atrium: Right atrial size was normal in size. Pericardium: There is no evidence of pericardial effusion. Mitral Valve: The mitral valve is normal in structure. Trivial mitral valve regurgitation. No evidence of mitral valve stenosis. Tricuspid Valve: The tricuspid valve is normal in structure. Tricuspid valve regurgitation is trivial. Aortic Valve: The aortic valve was not well visualized. Aortic valve regurgitation is not visualized. No aortic stenosis is present. Pulmonic Valve: The pulmonic valve was not well visualized. Pulmonic valve regurgitation is not visualized. Aorta: The aortic root and ascending aorta are structurally normal, with no evidence of dilitation. Venous: The inferior vena cava is normal in size with greater than 50% respiratory variability, suggesting right atrial pressure of 3 mmHg. IAS/Shunts: The interatrial septum was not well visualized.  LEFT VENTRICLE PLAX 2D LVIDd:         3.50 cm   Diastology LVIDs:         2.50 cm   LV e' medial:    7.94 cm/s LV PW:         0.90 cm   LV E/e' medial:  6.9 LV IVS:        1.00 cm   LV e' lateral:   14.00 cm/s LVOT diam:     1.90 cm   LV E/e' lateral: 3.9 LV SV:         39 LV SV Index:   28 LVOT Area:     2.84 cm  RIGHT VENTRICLE             IVC RV Basal diam:  2.10 cm     IVC diam: 1.30 cm RV S prime:     15.70 cm/s TAPSE (M-mode): 1.8 cm LEFT ATRIUM             Index        RIGHT ATRIUM          Index LA diam:        2.30 cm 1.66 cm/m   RA Area:     8.26 cm LA Vol (A2C):   33.3 ml 24.08 ml/m  RA Volume:   18.00 ml 13.02 ml/m LA Vol (A4C):   17.4 ml 12.58 ml/m LA Biplane Vol: 25.2 ml 18.23 ml/m  AORTIC VALVE LVOT Vmax:   105.00 cm/s LVOT Vmean:  66.500 cm/s LVOT VTI:    0.136 m  AORTA Ao Root diam: 3.00 cm Ao Asc diam:  2.70 cm MV E velocity: 54.40 cm/s MV A velocity: 65.50 cm/s  SHUNTS MV  E/A ratio:  0.83        Systemic VTI:  0.14 m                            Systemic Diam: 1.90 cm Lonni Nanas MD Electronically signed by Lonni Nanas MD Signature Date/Time: 02/12/2024/4:52:18 PM    Final         Scheduled Meds:  amLODipine   5 mg Oral Daily   atorvastatin   20 mg Oral Daily   Chlorhexidine  Gluconate Cloth  6 each Topical Daily   heparin   5,000 Units Subcutaneous Q8H   insulin  aspart  0-5 Units Subcutaneous QHS   insulin   aspart  0-9 Units Subcutaneous TID WC   tamsulosin   0.4 mg Oral QPC supper   Continuous Infusions:  lactated ringers        LOS: 2 days    Time spent: 55 minutes    Nautika Cressey D Erasmus Bistline, DO [facility]  If 7PM-7AM, please contact night-coverage [email or web address] 02/13/2024, 3:19 PM

## 2024-02-14 DIAGNOSIS — E876 Hypokalemia: Secondary | ICD-10-CM | POA: Diagnosis not present

## 2024-02-14 LAB — GLUCOSE, CAPILLARY
Glucose-Capillary: 145 mg/dL — ABNORMAL HIGH (ref 70–99)
Glucose-Capillary: 145 mg/dL — ABNORMAL HIGH (ref 70–99)
Glucose-Capillary: 212 mg/dL — ABNORMAL HIGH (ref 70–99)
Glucose-Capillary: 101 mg/dL — ABNORMAL HIGH (ref 70–99)
Glucose-Capillary: 196 mg/dL — ABNORMAL HIGH (ref 70–99)

## 2024-02-14 LAB — BASIC METABOLIC PANEL WITH GFR
Anion gap: 10 (ref 5–15)
BUN: <5 mg/dL — ABNORMAL LOW (ref 6–20)
CO2: 29 mmol/L (ref 22–32)
Calcium: 8.1 mg/dL — ABNORMAL LOW (ref 8.9–10.3)
Chloride: 101 mmol/L (ref 98–111)
Creatinine, Ser: 0.75 mg/dL (ref 0.44–1.00)
GFR, Estimated: >60 mL/min (ref >60)
Glucose, Bld: 148 mg/dL — ABNORMAL HIGH (ref 70–99)
Potassium: 3.2 mmol/L — ABNORMAL LOW (ref 3.5–5.1)
Sodium: 140 mmol/L (ref 135–145)

## 2024-02-14 LAB — CBC
HCT: 24.8 % — ABNORMAL LOW (ref 36.0–46.0)
Hemoglobin: 8.1 g/dL — ABNORMAL LOW (ref 12.0–15.0)
MCH: 29.7 pg (ref 26.0–34.0)
MCHC: 32.7 g/dL (ref 30.0–36.0)
MCV: 90.8 fL (ref 80.0–100.0)
Platelets: 159 K/uL (ref 150–400)
RBC: 2.73 MIL/uL — ABNORMAL LOW (ref 3.87–5.11)
RDW: 12.2 % (ref 11.5–15.5)
WBC: 3.2 K/uL — ABNORMAL LOW (ref 4.0–10.5)
nRBC: 0 % (ref 0.0–0.2)

## 2024-02-14 LAB — MAGNESIUM: Magnesium: 1.4 mg/dL — ABNORMAL LOW (ref 1.7–2.4)

## 2024-02-14 MED ORDER — MAGNESIUM SULFATE 2 GM/50ML IV SOLN
2.0000 g | Freq: Once | INTRAVENOUS | Status: AC
  Administered 2024-02-14: 2 g via INTRAVENOUS
  Filled 2024-02-14: qty 50

## 2024-02-14 MED ORDER — SODIUM CHLORIDE 0.9 % IV SOLN: INTRAVENOUS | Status: AC

## 2024-02-14 MED ORDER — GLUCERNA SHAKE PO LIQD
237.0000 mL | Freq: Three times a day (TID) | ORAL | Status: DC
  Administered 2024-02-14 – 2024-02-15 (×4): 237 mL via ORAL

## 2024-02-14 MED ORDER — ADULT MULTIVITAMIN W/MINERALS CH
1.0000 | ORAL_TABLET | Freq: Every day | ORAL | Status: DC
  Administered 2024-02-14 – 2024-02-15 (×2): 1 via ORAL
  Filled 2024-02-14 (×2): qty 1

## 2024-02-14 MED ORDER — POTASSIUM CHLORIDE CRYS ER 20 MEQ PO TBCR
40.0000 meq | EXTENDED_RELEASE_TABLET | ORAL | Status: AC
  Administered 2024-02-14 (×2): 40 meq via ORAL
  Filled 2024-02-14 (×2): qty 2

## 2024-02-14 NOTE — Progress Notes (Signed)
 PROGRESS NOTE   Monique Nguyen  FMW:[id number] DOB: April 17, 1971 DOA: 02/10/2024 PCP: Vicky Berg, MD   Brief Narrative:    Monique Nguyen  is a 53 y.o. female, with a history of alcohol  induced chronic pancreatitis, failure to thrive, pancreatic pseudocyst, anxiety/depression, and chronic abdominal pain, urinary retention, no further alcohol  abuse, presents to ED due to concern of buttocks abscess, patient has some nausea, but no vomiting, he is she is having chronic abdominal pain patient in ED reports she has a concern of pilonidal cyst, no fever, no discharge, reports been bothering her for roughly a year, she was told to follow-up with the surgery as an outpatient but did not follow so far.. - In ED she was noted with no palpable abscess, erythema or warmth by ED physician, CT abdomen pelvis with no evidence of buttocks abscess, but was significant for bilateral hydronephrosis due to urinary retention, as well workup significant for severe hypokalemia 2.1, and hypophosphatemia, so [facility]ist consulted to admit.  Patient continues to have significant tachycardia and is undergoing voiding trial today.  2D echocardiogram with no acute findings noted.  It appears that she is quite dehydrated and is requiring multiple fluid boluses as well as high rate of IV infusion to help with dehydration related tachycardia.  Assessment & Plan:   Principal Problem:   Hypokalemia Active Problems:   Chronic pancreatitis (HCC)   Chronic abdominal pain  Assessment and Plan:  1)Hypokalemia/hypomagnesemia-- Likely secondary to ongoing diarrhea - Replace and recheck---patient needs further replacement of electrolytes -- Persistent tachycardia with ambulation  2)Sinus tachycardia-persistent -Appears to be related to volume depletion with significant postobstructive diuresis -D-dimer negative - 2D echocardiogram with no significant findings - Continued to bolus with 3 L given today and started  on aggressive IV infusion.  Patient noted to have net negative almost 11 L with postobstructive diuresis 02/14/24 -Patient attempted to ambulate with mobility tech heart rate went up to the 150s she was somewhat dizzy with palpitations - Weaned off oxygen - Endorses fatigue and weakness -- Hydrate and replace electrolytes   3)Urinary retention Bilateral hydronephrosis>> due to urinary retention - Reports history of urological surgery in the past,  - Failed voiding trial and will need urology outpatient follow-up -Continue Flomax    4)Chronic Pancreatitis -No acute finding on imaging, no nausea, no vomiting  5)Acute on chronic Anemia--Hgb drifting down due to rehydration/IV fluids --No obvious bleeding   Chronic abdominal pain -Multifactorial including constipation, urinary retention, acute on chronic pancreatitis and pseudocysts - As needed opiates   THC use -She endorses THC use, confirmed on UDS   Diabetes mellitus A1c 6.0-- reflecting excellent Diabetic control PTA -Hold PTA metformin  Use Novolog /Humalog Sliding scale insulin  with Accu-Cheks/Fingersticks as ordered -   Hypertension - BP stable on amlodipine    DVT prophylaxis:Heparin  Code Status: Full Family Communication: None at bedside Disposition Plan:  Status is: Inpatient Remains inpatient appropriate because: Need for IV fluid.   Consultants:  None  Procedures:  None  Antimicrobials:  None   Subjective: -Patient attempted to ambulate with mobility tech heart rate went up to the 150s she was somewhat dizzy with palpitations - Weaned off oxygen - Endorses fatigue and weakness  Objective: Vitals:   02/13/24 2051 02/13/24 2303 02/14/24 0403 02/14/24 1336  BP:   (!) 147/86 122/77  Pulse:   (!) 105 (!) 101  Resp: 15 14 14 19   Temp:   98.3 F (36.8 C) 98.4 F (36.9 C)  TempSrc:   Oral Oral  SpO2:   99% 99%  Weight:      Height:        Intake/Output Summary (Last 24 hours) at 02/14/2024 1815 Last  data filed at 02/14/2024 1700 Gross per 24 hour  Intake 2344.06 ml  Output 4450 ml  Net -2105.94 ml   Filed Weights   02/10/24 1202  Weight: 43.5 kg   Physical Exam Gen:- Awake Alert, in no acute distress  HEENT:- [place].AT, No sclera icterus Nose- [place] 2L/min--weaned off oxygen as of 02/14/2023 Neck-Supple Neck,No JVD,.  Lungs-improved air movement, no wheezing CV- S1, S2 normal, RRR Abd-  +ve B.Sounds, Abd Soft, No tenderness, no CVA tenderness Extremity/Skin:- No  edema,   good pedal pulses  Psych-affect is appropriate, oriented x3 Neuro-no new focal deficits, no tremors GU--Foley in situ, failed voiding trial will need to keep Foley for outpatient follow-up with urologist   Data Reviewed: I have personally reviewed following labs and imaging studies  CBC: Recent Labs  Lab 02/10/24 1237 02/11/24 0429 02/12/24 0450 02/14/24 0446  WBC 4.3 4.4 3.5* 3.2*  NEUTROABS 3.1  --   --   --   HGB 9.7* 9.4* 9.4* 8.1*  HCT 29.8* 28.4* 28.5* 24.8*  MCV 90.6 89.3 90.5 90.8  PLT 244 275 251 159   Basic Metabolic Panel: Recent Labs  Lab 02/10/24 1237 02/10/24 1440 02/10/24 2055 02/11/24 0429 02/12/24 0450 02/13/24 0750 02/14/24 0446  NA 143  --   --  142 139 139 140  K 2.1*  --  2.7* 3.3* 3.5 3.6 3.2*  CL 99  --   --  104 100 97* 101  CO2 28  --   --  28 26 26 29   GLUCOSE 141*  --   --  133* 153* 156* 148*  BUN 14  --   --  7 8 6  <5*  CREATININE 0.97  --   --  0.68 0.84 0.80 0.75  CALCIUM  9.1  --   --  8.6* 8.3* 8.6* 8.1*  MG  --  1.9  --   --  1.5* 1.8 1.4*  PHOS  --   --   --  3.4  --   --   --    GFR: Estimated Creatinine Clearance: 55.8 mL/min (by C-G formula based on SCr of 0.75 mg/dL). Liver Function Tests: Recent Labs  Lab 02/10/24 1237  AST 22  ALT 14  ALKPHOS 59  BILITOT 0.7  PROT 7.6  ALBUMIN 4.3   Recent Labs  Lab 02/10/24 1237  LIPASE 23   CBG: Recent Labs  Lab 02/13/24 2150 02/14/24 0719 02/14/24 0847 02/14/24 1117 02/14/24 1628  GLUCAP 134*  145* 145* 212* 101*   Sepsis Labs: Recent Labs  Lab 02/10/24 1440  LATICACIDVEN 0.9    Recent Results (from the past 240 hours)  Culture, blood (routine x 2)     Status: None (Preliminary result)   Collection Time: 02/10/24  2:40 PM   Specimen: BLOOD RIGHT FOREARM  Result Value Ref Range Status   Specimen Description   Final    BLOOD RIGHT FOREARM BOTTLES DRAWN AEROBIC AND ANAEROBIC Performed at [facility], [address], [place], [place] [address]    Special Requests   Final    Blood Culture adequate volume Performed at [facility], [address] [place], [place] [address]    Culture   Final    NO GROWTH 4 DAYS Performed at [facility], [address], [place], [place] [address]  Report Status PENDING  Incomplete  Culture, blood (routine x 2)     Status: None (Preliminary result)   Collection Time: 02/10/24  3:37 PM   Specimen: Left Antecubital; Blood  Result Value Ref Range Status   Specimen Description   Final    LEFT ANTECUBITAL BOTTLES DRAWN AEROBIC AND ANAEROBIC Performed at [facility], [address] [place], [place] [address]    Special Requests   Final    Blood Culture adequate volume Performed at [facility], [address] [place], [place] [address]    Culture   Final    NO GROWTH 4 DAYS Performed at [facility], [address], [place], [place] [address]    Report Status PENDING  Incomplete    Scheduled Meds:  amLODipine   10 mg Oral Daily   atorvastatin   20 mg Oral Daily   Chlorhexidine  Gluconate Cloth  6 each Topical Daily   feeding supplement (GLUCERNA SHAKE)  237 mL Oral TID BM   heparin   5,000 Units Subcutaneous Q8H   insulin  aspart  0-5 Units Subcutaneous QHS   insulin  aspart  0-9 Units Subcutaneous TID WC   multivitamin with minerals  1 tablet Oral Daily   tamsulosin   0.4 mg Oral QPC supper     LOS: 3 days    Rendall Carwin, MD [facility]  If 7PM-7AM, please contact  night-coverage [email or web address] 02/14/2024, 6:15 PM

## 2024-02-14 NOTE — Plan of Care (Signed)

## 2024-02-14 NOTE — Plan of Care (Signed)
 Problem: Education: Goal: Knowledge of General Education information will improve Description: Including pain rating scale, medication(s)/side effects and non-pharmacologic comfort measures 02/14/2024 0405 by Monique Harlie NOVAK, RN Outcome: Progressing 02/14/2024 0403 by Monique Harlie NOVAK, RN Outcome: Progressing   Problem: Health Behavior/Discharge Planning: Goal: Ability to manage health-related needs will improve 02/14/2024 0405 by Monique Harlie NOVAK, RN Outcome: Progressing 02/14/2024 0403 by Monique Harlie NOVAK, RN Outcome: Progressing   Problem: Clinical Measurements: Goal: Ability to maintain clinical measurements within normal limits will improve 02/14/2024 0405 by Monique Harlie NOVAK, RN Outcome: Progressing 02/14/2024 0403 by Monique Harlie NOVAK, RN Outcome: Progressing Goal: Will remain free from infection 02/14/2024 0405 by Monique Harlie NOVAK, RN Outcome: Progressing 02/14/2024 0403 by Monique Harlie NOVAK, RN Outcome: Progressing Goal: Diagnostic test results will improve 02/14/2024 0405 by Monique Harlie NOVAK, RN Outcome: Progressing 02/14/2024 0403 by Monique Harlie NOVAK, RN Outcome: Progressing Goal: Respiratory complications will improve 02/14/2024 0405 by Monique Harlie NOVAK, RN Outcome: Progressing 02/14/2024 0403 by Monique Harlie NOVAK, RN Outcome: Progressing Goal: Cardiovascular complication will be avoided 02/14/2024 0405 by Monique Harlie NOVAK, RN Outcome: Progressing 02/14/2024 0403 by Monique Harlie NOVAK, RN Outcome: Progressing   Problem: Activity: Goal: Risk for activity intolerance will decrease 02/14/2024 0405 by Monique Harlie NOVAK, RN Outcome: Progressing 02/14/2024 0403 by Monique Harlie NOVAK, RN Outcome: Progressing   Problem: Nutrition: Goal: Adequate nutrition will be maintained 02/14/2024 0405 by Monique Harlie NOVAK, RN Outcome: Progressing 02/14/2024 0403 by Monique Harlie NOVAK, RN Outcome: Progressing   Problem: Coping: Goal: Level of anxiety will  decrease 02/14/2024 0405 by Monique Harlie NOVAK, RN Outcome: Progressing 02/14/2024 0403 by Monique Harlie NOVAK, RN Outcome: Progressing   Problem: Elimination: Goal: Will not experience complications related to bowel motility 02/14/2024 0405 by Monique Harlie NOVAK, RN Outcome: Progressing 02/14/2024 0403 by Monique Harlie NOVAK, RN Outcome: Progressing Goal: Will not experience complications related to urinary retention 02/14/2024 0405 by Monique Harlie NOVAK, RN Outcome: Progressing 02/14/2024 0403 by Monique Harlie NOVAK, RN Outcome: Progressing   Problem: Pain Managment: Goal: General experience of comfort will improve and/or be controlled 02/14/2024 0405 by Monique Harlie NOVAK, RN Outcome: Progressing 02/14/2024 0403 by Monique Harlie NOVAK, RN Outcome: Progressing   Problem: Safety: Goal: Ability to remain free from injury will improve 02/14/2024 0405 by Monique Harlie NOVAK, RN Outcome: Progressing 02/14/2024 0403 by Monique Harlie NOVAK, RN Outcome: Progressing   Problem: Skin Integrity: Goal: Risk for impaired skin integrity will decrease 02/14/2024 0405 by Monique Harlie NOVAK, RN Outcome: Progressing 02/14/2024 0403 by Monique Harlie NOVAK, RN Outcome: Progressing   Problem: Education: Goal: Ability to describe self-care measures that may prevent or decrease complications (Diabetes Survival Skills Education) will improve 02/14/2024 0405 by Monique Harlie NOVAK, RN Outcome: Progressing 02/14/2024 0403 by Monique Harlie NOVAK, RN Outcome: Progressing Goal: Individualized Educational Video(s) 02/14/2024 0405 by Monique Harlie NOVAK, RN Outcome: Progressing 02/14/2024 0403 by Monique Harlie NOVAK, RN Outcome: Progressing   Problem: Coping: Goal: Ability to adjust to condition or change in health will improve 02/14/2024 0405 by Monique Harlie NOVAK, RN Outcome: Progressing 02/14/2024 0403 by Monique Harlie NOVAK, RN Outcome: Progressing   Problem: Fluid Volume: Goal: Ability to maintain a balanced  intake and output will improve 02/14/2024 0405 by Monique Harlie NOVAK, RN Outcome: Progressing 02/14/2024 0403 by Monique Harlie NOVAK, RN Outcome: Progressing   Problem: Health Behavior/Discharge Planning: Goal: Ability to identify and utilize available resources and services will improve 02/14/2024 0405 by Monique Harlie NOVAK, RN Outcome: Progressing 02/14/2024 0403 by  Monique Snare B, RN Outcome: Progressing Goal: Ability to manage health-related needs will improve 02/14/2024 0405 by Monique Snare NOVAK, RN Outcome: Progressing 02/14/2024 0403 by Monique Snare NOVAK, RN Outcome: Progressing   Problem: Metabolic: Goal: Ability to maintain appropriate glucose levels will improve 02/14/2024 0405 by Monique Snare NOVAK, RN Outcome: Progressing 02/14/2024 0403 by Monique Snare NOVAK, RN Outcome: Progressing   Problem: Nutritional: Goal: Maintenance of adequate nutrition will improve 02/14/2024 0405 by Monique Snare NOVAK, RN Outcome: Progressing 02/14/2024 0403 by Monique Snare NOVAK, RN Outcome: Progressing Goal: Progress toward achieving an optimal weight will improve 02/14/2024 0405 by Monique Snare NOVAK, RN Outcome: Progressing 02/14/2024 0403 by Monique Snare NOVAK, RN Outcome: Progressing   Problem: Skin Integrity: Goal: Risk for impaired skin integrity will decrease 02/14/2024 0405 by Monique Snare NOVAK, RN Outcome: Progressing 02/14/2024 0403 by Monique Snare NOVAK, RN Outcome: Progressing   Problem: Tissue Perfusion: Goal: Adequacy of tissue perfusion will improve 02/14/2024 0405 by Monique Snare NOVAK, RN Outcome: Progressing 02/14/2024 0403 by Monique Snare NOVAK, RN Outcome: Progressing

## 2024-02-14 NOTE — Progress Notes (Signed)
 Mobility Specialist Progress Note:    02/14/24 1340  Mobility  Activity Ambulated with assistance  Level of Assistance Modified independent, requires aide device or extra time  Assistive Device Front wheel walker  Distance Ambulated (ft) 200 ft  Range of Motion/Exercises Active;All extremities  Activity Response Tolerated well  Mobility Referral Yes  Mobility visit 1 Mobility  Mobility Specialist Start Time (ACUTE ONLY) 1340  Mobility Specialist Stop Time (ACUTE ONLY) 1400  Mobility Specialist Time Calculation (min) (ACUTE ONLY) 20 min   Pt received in chair, agreeable to mobility. ModI to stand and ambulate with RW. Tolerated well, SpO2 97% on RA at rest. SpO2 98% on RA EOS. HR peaked at 152 during session, returned to chair. All needs met.  Jayah Balthazar Mobility Specialist Please contact via Special educational needs teacher or  Rehab office at [phone]

## 2024-02-14 NOTE — Plan of Care (Signed)
 Pt HR in 100-110 at rest, noted elevated HR when patient is anxious and during ambulation in the hall, HR jumped up to 130s. Recovered back to HR in 100s with rest. No c/o chest pain, palpitation, dizziness, or SOB.

## 2024-02-14 NOTE — Progress Notes (Signed)
 Initial Nutrition Assessment  DOCUMENTATION CODES:   Underweight  INTERVENTION:   -Liberalize diet to carb modified for wider variety of meal selections -Glucerna Shake po TID, each supplement provides 220 kcal and 10 grams of protein  -MVI with minerals daily  NUTRITION DIAGNOSIS:   Increased nutrient needs related to chronic illness (pancreatitis) as evidenced by estimated needs.  GOAL:   Patient will meet greater than or equal to 90% of their needs  MONITOR:   PO intake, Supplement acceptance  REASON FOR ASSESSMENT:   Malnutrition Screening Tool    ASSESSMENT:   Pt with a history of alcohol  induced chronic pancreatitis, failure to thrive, pancreatic pseudocyst, anxiety/depression, and chronic abdominal pain, urinary retention, no further alcohol  abuse, presents due to concern of buttocks abscess. Patient has some nausea, but no vomiting; she is having chronic abdominal pain. 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  Pt admitted with hypokalemia, urinary retention, and bilateral hydronephrosis.   8/30- PEG removed  Reviewed I/O's: -3.5 L x 24 hours and -14 L since admission  UOP: 6.6 L x 24 hours  Pt unavailable at time of visit. Attempted to speak with pt via call to hospital room phone, however, unable to reach. RD unable to obtain further nutrition-related history or complete nutrition-focused physical exam at this time.    Case discussed in interdisciplinary rounds.   Case discussed with RN. Pt refused breakfast secondary to pain.   Pt currently on a heart healthy/ carb modified diet. Pt otherwise with good oral intake. Noted meal completions 25-100%.   Reviewed wt hx. Per CareEverywhere, wt was 106# on 08/16/22 encounter. Pt with mild distant history of weight loss. Noted pt with history of severe malnutrition.  Suspect this is ongoing, however, unable to identify at this time.   Medications reviewed and include potassium chloride .   Lab Results  Component Value Date   HGBA1C 6.0 (H) 02/10/2024   PTA DM medications are 850 mg metformin  BID.   Labs reviewed: CBGS: 134-251 (inpatient orders for glycemic control are 0-5 units insulin  aspart daily at bedtime and 0-9 units insulin  aspart TID with meals).    Diet Order:   Diet Order             Diet heart healthy/carb modified Room service appropriate? Yes; Fluid consistency: Thin  Diet effective now                   EDUCATION NEEDS:   No education needs have been identified at this time  Skin:  Skin Assessment: Reviewed RN Assessment  Last BM:  02/08/24  Height:   Ht Readings from Last 1 Encounters:  02/10/24 5' 1 (1.549 m)    Weight:   Wt Readings from Last 1 Encounters:  02/10/24 43.5 kg    Ideal Body Weight:  47.7 kg  BMI:  Body mass index is 18.14 kg/m.  Estimated Nutritional Needs:   Kcal:  1550-1750  Protein:  75-90 grams  Fluid:  1.5-1.7 L    Margery ORN, RD, LDN, CDCES Registered Dietitian III Certified Diabetes Care and Education Specialist If unable to reach this RD, please use RD Inpatient group chat on secure chat between hours of 8am-4 pm daily

## 2024-02-15 ENCOUNTER — Other Ambulatory Visit (HOSPITAL_BASED_OUTPATIENT_CLINIC_OR_DEPARTMENT_OTHER): Payer: Self-pay

## 2024-02-15 LAB — GLUCOSE, CAPILLARY
Glucose-Capillary: 154 mg/dL — ABNORMAL HIGH (ref 70–99)
Glucose-Capillary: 118 mg/dL — ABNORMAL HIGH (ref 70–99)
Glucose-Capillary: 163 mg/dL — ABNORMAL HIGH (ref 70–99)

## 2024-02-15 LAB — CULTURE, BLOOD (ROUTINE X 2)
Culture: NO GROWTH
Culture: NO GROWTH
Special Requests: ADEQUATE
Special Requests: ADEQUATE

## 2024-02-15 LAB — RENAL FUNCTION PANEL
Albumin: 3.1 g/dL — ABNORMAL LOW (ref 3.5–5.0)
Anion gap: 8 (ref 5–15)
BUN: 7 mg/dL (ref 6–20)
CO2: 25 mmol/L (ref 22–32)
Calcium: 7.8 mg/dL — ABNORMAL LOW (ref 8.9–10.3)
Chloride: 106 mmol/L (ref 98–111)
Creatinine, Ser: 0.71 mg/dL (ref 0.44–1.00)
GFR, Estimated: >60 mL/min (ref >60)
Glucose, Bld: 139 mg/dL — ABNORMAL HIGH (ref 70–99)
Phosphorus: 3.4 mg/dL (ref 2.5–4.6)
Potassium: 3 mmol/L — ABNORMAL LOW (ref 3.5–5.1)
Sodium: 139 mmol/L (ref 135–145)

## 2024-02-15 LAB — MAGNESIUM: Magnesium: 2.1 mg/dL (ref 1.7–2.4)

## 2024-02-15 MED ORDER — ACETAMINOPHEN 325 MG PO TABS: 650.0000 mg | ORAL_TABLET | Freq: Four times a day (QID) | ORAL | Status: AC | PRN

## 2024-02-15 MED ORDER — METFORMIN HCL 500 MG PO TABS: 500.0000 mg | ORAL_TABLET | Freq: Two times a day (BID) | ORAL | 5 refills | Status: AC

## 2024-02-15 MED ORDER — HYDROXYZINE HCL 25 MG PO TABS: 25.0000 mg | ORAL_TABLET | Freq: Three times a day (TID) | ORAL | 0 refills | Status: AC | PRN

## 2024-02-15 MED ORDER — ATORVASTATIN CALCIUM 20 MG PO TABS: 20.0000 mg | ORAL_TABLET | Freq: Every day | ORAL | 5 refills | Status: AC

## 2024-02-15 MED ORDER — TAMSULOSIN HCL 0.4 MG PO CAPS: 0.4000 mg | ORAL_CAPSULE | Freq: Every day | ORAL | 1 refills | Status: DC

## 2024-02-15 MED ORDER — AMLODIPINE BESYLATE 10 MG PO TABS: 10.0000 mg | ORAL_TABLET | Freq: Every day | ORAL | 5 refills | Status: AC

## 2024-02-15 MED ORDER — POTASSIUM CHLORIDE CRYS ER 20 MEQ PO TBCR
40.0000 meq | EXTENDED_RELEASE_TABLET | ORAL | Status: AC
  Administered 2024-02-15 (×2): 40 meq via ORAL
  Filled 2024-02-15 (×2): qty 2

## 2024-02-15 NOTE — Plan of Care (Signed)

## 2024-02-15 NOTE — Discharge Summary (Signed)
 Monique Nguyen, is a 53 y.o. female  DOB [birth year]  MRN [id number].  Admission date:  02/10/2024  Admitting Physician  Brayton GORMAN Lye, MD  Discharge Date:  02/15/2024   Primary MD  Vicky Berg, MD  Recommendations for primary care physician for things to follow:  1)Very Low-salt diet advised---Less than 2 gm of Sodium per day advised----ok to use Mrs DASH salt substitute instead of Salt 2)Weigh yourself daily, call if you gain more than 3 pounds in 1 day or more than 5 pounds in 1 week as your diuretic medications may need to be adjusted 3)Limit your Fluid  intake to no more than 60 ounces (1.8 Liters) per day 4)Avoid ibuprofen /Advil /Aleve/Motrin Josefine Powders/Naproxen/BC powders/Meloxicam/Diclofenac/Indomethacin and other Nonsteroidal anti-inflammatory medications as these will make you more likely to bleed and can cause stomach ulcers, can also cause Kidney problems.  5)Please follow-up with Urologist Dr. Sherrilee in about --- in 1 week  for Foley catheter removal and Voiding trial and for recheck and follow-up evaluation  in his office----[facility], [address], [place] [address] Phone Number----[phone]  Admission Diagnosis  Hypokalemia [E87.6] Urinary retention [R33.9]   Discharge Diagnosis  Hypokalemia [E87.6] Urinary retention [R33.9]    Principal Problem:   Hypokalemia Active Problems:   Chronic pancreatitis (HCC)   Chronic abdominal pain      Past Medical History:  Diagnosis Date   Alcohol  abuse    Anxiety    Bacterial vaginosis    Chronic abdominal pain    Chronic pain    Depression    GERD (gastroesophageal reflux disease)    IBS (irritable bowel syndrome)    Pancreatitis    ETOH   Panic attacks    Psoriasis     Past Surgical History:  Procedure Laterality Date   BLADDER SURGERY     X 3   ESOPHAGOGASTRODUODENOSCOPY (EGD) WITH  PROPOFOL  N/A 10/16/2018   Procedure: ESOPHAGOGASTRODUODENOSCOPY (EGD) WITH PROPOFOL ;  Surgeon: Shaaron Lamar HERO, MD;  [facility]: [facility];  Service: Endoscopy;  Laterality: N/A;   ESOPHAGOGASTRODUODENOSCOPY (EGD) WITH PROPOFOL  N/A 10/22/2018   Procedure: ESOPHAGOGASTRODUODENOSCOPY (EGD) WITH PROPOFOL ;  Surgeon: Kallie Manuelita BROCKS, MD;  Location: [facility];  Service: General;  Laterality: N/A;   ESOPHAGOGASTRODUODENOSCOPY (EGD) WITH PROPOFOL   02/01/2019   Procedure: ESOPHAGOGASTRODUODENOSCOPY (EGD) WITH PROPOFOL ;  Surgeon: Harvey Margo CROME, MD;  Location: [facility];  Service: Endoscopy;;   EUS N/A 10/11/2012   Dr. Teressa: 3.7 cm cystic lesion in body of pancreas with large amount of debris, s/p FNA with reddish, milky fluid, main pancreatic duct normal,    EUS  12/2015   mild to moderate chronic pancreatitis, pancreatic duct with dilation and intraductal stones, measuring up to 6 mm in diameter, no abnormality in main bile duct, abnormal lymph nodes in perigastric region, largest measuring 12 mm in diameter, s/p fine needle biopsy. Pathology with multiple fragments of lymphoid tissue with granulomatous inflammation, negative GMS and AFB stains,   PEG PLACEMENT N/A 10/22/2018   Procedure: PERCUTANEOUS  ENDOSCOPIC GASTROSTOMY (PEG) PLACEMENT with endoscopy;  Surgeon: Kallie Manuelita BROCKS, MD;  Location: [facility];  Service: General;  Laterality: N/A;     HPI  from the history and physical done on the day of admission:   Monique Nguyen  is a 53 y.o. female, with a history of alcohol  induced chronic pancreatitis, failure to thrive, pancreatic pseudocyst, anxiety/depression, and chronic abdominal pain, urinary retention, no further alcohol  abuse, presents to ED due to concern of buttocks abscess, patient has some nausea, but no vomiting, he is she is having chronic abdominal pain patient in ED reports she has a concern of pilonidal cyst, no fever, no discharge, reports been bothering her for roughly a year, she was  told to follow-up with the surgery as an outpatient but did not follow so far.. - In ED she was noted with no palpable abscess, erythema or warmth by ED physician, CT abdomen pelvis with no evidence of buttocks abscess, but was significant for bilateral hydronephrosis due to urinary retention, as well workup significant for severe hypokalemia 2.1, and hypophosphatemia, so [facility]  hospitalist consulted to admit.   Hospital Course:     Assessment and Plan: 1)Hypokalemia/hypomagnesemia-- Likely secondary to ongoing diarrhea - Electrolytes replaced, tachycardia improved with hydration and electrolyte replacement   2)Sinus tachycardia- -Appears to be related to volume depletion with significant postobstructive diuresis -D-dimer negative - 2D echocardiogram with no significant findings  Patient noted to have net negative almost 11 L with postobstructive diuresis  tachycardia improved with hydration and electrolyte replacement  - Weaned off oxygen - Endorses fatigue and weakness - tachycardia improved with hydration and electrolyte replacement   3)Urinary retention Bilateral hydronephrosis>> due to urinary retention - Reports history of urological surgery in the past,  - Failed voiding trial and will need urology outpatient follow-up -Continue Flomax  - Continue Foley and follow-up as outpatient with urologist-advised   4)Chronic Pancreatitis -No acute finding on imaging, no nausea, no vomiting   5)Acute on chronic Anemia--Hgb drifting down due to rehydration/IV fluids --No obvious bleeding   Chronic abdominal pain -Multifactorial including constipation, urinary retention, acute on chronic pancreatitis and pseudocysts - Follow-up with PCP for ongoing management   THC use -She endorses THC use, confirmed on UDS   Diabetes mellitus A1c 6.0-- reflecting excellent Diabetic control PTA -Resume PTA metformin    Hypertension - BP stable on amlodipine    Discharge Condition: -Stable  without hypoxia  Follow UP   Follow-up Information     McKenzie, Belvie CROME, MD. Schedule an appointment as soon as possible for a visit in 1 week(s).   Specialty: Urology Why: for Foley catheter removal and Voiding trial Contact information: [address] [place] [address] [phone]                 Diet and Activity recommendation:  As advised  Discharge Instructions    Discharge Instructions     Call MD for:  difficulty breathing, headache or visual disturbances   Complete by: As directed    Call MD for:  persistant dizziness or light-headedness   Complete by: As directed    Call MD for:  persistant nausea and vomiting   Complete by: As directed    Call MD for:  temperature >100.4   Complete by: As directed    Diet - low sodium heart healthy   Complete by: As directed    Diet Carb Modified   Complete by: As directed    Discharge instructions   Complete by:  As directed    1)Very Low-salt diet advised---Less than 2 gm of Sodium per day advised----ok to use Mrs DASH salt substitute instead of Salt 2)Weigh yourself daily, call if you gain more than 3 pounds in 1 day or more than 5 pounds in 1 week as your diuretic medications may need to be adjusted 3)Limit your Fluid  intake to no more than 60 ounces (1.8 Liters) per day 4)Avoid ibuprofen /Advil /Aleve/Motrin Josefine Powders/Naproxen/BC powders/Meloxicam/Diclofenac/Indomethacin and other Nonsteroidal anti-inflammatory medications as these will make you more likely to bleed and can cause stomach ulcers, can also cause Kidney problems.  5)Please follow-up with Urologist Dr. Sherrilee in about --- in 1 week  for Foley catheter removal and Voiding trial and for recheck and follow-up evaluation  in his office----[facility], [address], [place] [address] Phone Number----[phone]   Increase activity slowly   Complete by: As directed         Discharge Medications      Allergies as of 02/15/2024       Reactions   Doxycycline  Other (See Comments)   headaches   Sulfa  Antibiotics Nausea And Vomiting, Other (See Comments)   Blurred vision and headaches        Medication List     TAKE these medications    acetaminophen  325 MG tablet Commonly known as: TYLENOL  Take 2 tablets (650 mg total) by mouth every 6 (six) hours as needed for mild pain (pain score 1-3) or fever (or Fever >/= 101).   amLODipine  10 MG tablet Commonly known as: NORVASC  Take 1 tablet (10 mg total) by mouth daily.   atorvastatin  20 MG tablet Commonly known as: LIPITOR Take 1 tablet (20 mg total) by mouth daily.   hydrOXYzine  25 MG tablet Commonly known as: ATARAX  Take 1 tablet (25 mg total) by mouth 3 (three) times daily as needed for anxiety.   metFORMIN  500 MG tablet Commonly known as: GLUCOPHAGE  Take 1 tablet (500 mg total) by mouth 2 (two) times daily with a meal. What changed: Another medication with the same name was removed. Continue taking this medication, and follow the directions you see here.   tamsulosin  0.4 MG Caps capsule Commonly known as: FLOMAX  Take 1 capsule (0.4 mg total) by mouth daily after supper.        Major procedures and Radiology Reports - PLEASE review detailed and final reports for all details, in brief -   ECHOCARDIOGRAM COMPLETE Result Date: 02/12/2024    ECHOCARDIOGRAM REPORT   Patient Name:   Monique Nguyen Date of Exam: 02/12/2024 Medical Rec #:  [id number]         Height:       61.0 in Accession #:    [id number]        Weight:       96.0 lb Date of Birth:  12-11-1970          BSA:          1.383 m Patient Age:    53 years          BP:           130/87 mmHg Patient Gender: F                 HR:           114 bpm. Exam Location:  Inpatient Procedure: 2D Echo (Both Spectral and Color Flow Doppler were utilized during            procedure).  Indications:    dyspnea  History:        Patient has prior history of Echocardiogram examinations, most                  recent 11/03/2018. Risk Factors:Current Smoker.  Sonographer:    Tinnie Barefoot RDCS Referring Phys: [id number] PRATIK D [facility]  Sonographer Comments: Image acquisition challenging due to respiratory motion. IMPRESSIONS  1. Left ventricular ejection fraction, by estimation, is 60 to 65%. The left ventricle has normal function. The left ventricle has no regional wall motion abnormalities. Left ventricular diastolic parameters were normal.  2. Right ventricular systolic function is normal. The right ventricular size is normal. Tricuspid regurgitation signal is inadequate for assessing PA pressure.  3. The mitral valve is normal in structure. Trivial mitral valve regurgitation. No evidence of mitral stenosis.  4. The aortic valve was not well visualized. Aortic valve regurgitation is not visualized. No aortic stenosis is present.  5. The inferior vena cava is normal in size with greater than 50% respiratory variability, suggesting right atrial pressure of 3 mmHg. FINDINGS  Left Ventricle: Left ventricular ejection fraction, by estimation, is 60 to 65%. The left ventricle has normal function. The left ventricle has no regional wall motion abnormalities. The left ventricular internal cavity size was small. There is no left ventricular hypertrophy. Left ventricular diastolic parameters were normal. Right Ventricle: The right ventricular size is normal. No increase in right ventricular wall thickness. Right ventricular systolic function is normal. Tricuspid regurgitation signal is inadequate for assessing PA pressure. Left Atrium: Left atrial size was normal in size. Right Atrium: Right atrial size was normal in size. Pericardium: There is no evidence of pericardial effusion. Mitral Valve: The mitral valve is normal in structure. Trivial mitral valve regurgitation. No evidence of mitral valve stenosis. Tricuspid Valve: The tricuspid valve is normal in structure. Tricuspid valve regurgitation is trivial. Aortic  Valve: The aortic valve was not well visualized. Aortic valve regurgitation is not visualized. No aortic stenosis is present. Pulmonic Valve: The pulmonic valve was not well visualized. Pulmonic valve regurgitation is not visualized. Aorta: The aortic root and ascending aorta are structurally normal, with no evidence of dilitation. Venous: The inferior vena cava is normal in size with greater than 50% respiratory variability, suggesting right atrial pressure of 3 mmHg. IAS/Shunts: The interatrial septum was not well visualized.  LEFT VENTRICLE PLAX 2D LVIDd:         3.50 cm   Diastology LVIDs:         2.50 cm   LV e' medial:    7.94 cm/s LV PW:         0.90 cm   LV E/e' medial:  6.9 LV IVS:        1.00 cm   LV e' lateral:   14.00 cm/s LVOT diam:     1.90 cm   LV E/e' lateral: 3.9 LV SV:         39 LV SV Index:   28 LVOT Area:     2.84 cm  RIGHT VENTRICLE             IVC RV Basal diam:  2.10 cm     IVC diam: 1.30 cm RV S prime:     15.70 cm/s TAPSE (M-mode): 1.8 cm LEFT ATRIUM             Index        RIGHT ATRIUM          Index  LA diam:        2.30 cm 1.66 cm/m   RA Area:     8.26 cm LA Vol (A2C):   33.3 ml 24.08 ml/m  RA Volume:   18.00 ml 13.02 ml/m LA Vol (A4C):   17.4 ml 12.58 ml/m LA Biplane Vol: 25.2 ml 18.23 ml/m  AORTIC VALVE LVOT Vmax:   105.00 cm/s LVOT Vmean:  66.500 cm/s LVOT VTI:    0.136 m  AORTA Ao Root diam: 3.00 cm Ao Asc diam:  2.70 cm MV E velocity: 54.40 cm/s MV A velocity: 65.50 cm/s  SHUNTS MV E/A ratio:  0.83        Systemic VTI:  0.14 m                            Systemic Diam: 1.90 cm Monique Nanas MD Electronically signed by Monique Nanas MD Signature Date/Time: 02/12/2024/4:52:18 PM    Final    DG CHEST PORT 1 VIEW Result Date: 02/11/2024 CLINICAL DATA:  Hypoxemia. EXAM: PORTABLE CHEST 1 VIEW COMPARISON:  11/11/2018. FINDINGS: Trachea is midline. Heart size normal. Lungs are hyperinflated but clear. No pleural fluid. IMPRESSION: Hyperinflation without acute finding.  Electronically Signed   By: Newell Eke M.D.   On: 02/11/2024 11:50   CT ABDOMEN PELVIS W CONTRAST Result Date: 02/10/2024 CLINICAL DATA:  Abdominal pain, acute abdominal, nonlocalized. Abscess on buttocks. Nausea and back pain. EXAM: CT ABDOMEN AND PELVIS WITH CONTRAST TECHNIQUE: Multidetector CT imaging of the abdomen and pelvis was performed using the standard protocol following bolus administration of intravenous contrast. RADIATION DOSE REDUCTION: This exam was performed according to the departmental dose-optimization program which includes automated exposure control, adjustment of the mA and/or kV according to patient size and/or use of iterative reconstruction technique. CONTRAST:  80mL OMNIPAQUE  IOHEXOL  300 MG/ML  SOLN COMPARISON:  01/25/2022. FINDINGS: Lower chest: No acute abnormality. Hepatobiliary: No focal liver abnormality is seen. Fatty infiltration of the liver is noted no gallstones, gallbladder wall thickening, or biliary dilatation. Pancreas: Calcifications are noted in the pancreatic head. No pancreatic ductal dilatation or surrounding inflammatory changes. There is atrophy of the pancreatic tail. Spleen: Normal in size without focal abnormality. Adrenals/Urinary Tract: The adrenal glands are within normal limits. The kidneys enhance symmetrically. No renal calculus bilaterally. There is moderate hydroureteronephrosis bilaterally without evidence of obstructing calculus. The urinary bladder is markedly distended. Stomach/Bowel: The stomach is within normal limits. No bowel obstruction, free air, or pneumatosis is seen. Appendix appears normal. Vascular/Lymphatic: Aortic atherosclerosis. No enlarged abdominal or pelvic lymph nodes. Reproductive: The uterus is within normal limits. No adnexal mass is seen. Other: No free fluid in the pelvis.  No obvious abscess is seen. Musculoskeletal: No acute osseous abnormality. IMPRESSION: 1. No evidence of abscess. 2. Markedly distended urinary bladder  with moderate hydroureteronephrosis bilaterally suggesting bladder outlet obstruction. 3. And [place] steatosis. 4. Sequela of chronic pancreatitis. 5. Aortic atherosclerosis. Electronically Signed   By: Leita Birmingham M.D.   On: 02/10/2024 17:13    Micro Results   Recent Results (from the past 240 hours)  Culture, blood (routine x 2)     Status: None   Collection Time: 02/10/24  2:40 PM   Specimen: BLOOD RIGHT FOREARM  Result Value Ref Range Status   Specimen Description   Final    BLOOD RIGHT FOREARM BOTTLES DRAWN AEROBIC AND ANAEROBIC   Special Requests Blood Culture adequate volume  Final   Culture  Final    NO GROWTH 5 DAYS Performed at [facility], [address], [place], [place] [address]    Report Status 02/15/2024 FINAL  Final  Culture, blood (routine x 2)     Status: None   Collection Time: 02/10/24  3:37 PM   Specimen: Left Antecubital; Blood  Result Value Ref Range Status   Specimen Description   Final    LEFT ANTECUBITAL BOTTLES DRAWN AEROBIC AND ANAEROBIC   Special Requests Blood Culture adequate volume  Final   Culture   Final    NO GROWTH 5 DAYS Performed at [facility], [address] [place], [place] [address]    Report Status 02/15/2024 FINAL  Final    Today   Subjective    Zemira Zehring today has no new complaints - Eating and drinking well No fever  Or chills   No Nausea, Vomiting or Diarrhea   Patient has been seen and examined prior to discharge   Objective   Blood pressure 132/74, pulse (!) 111, temperature 97.9 F (36.6 C), temperature source Oral, resp. rate 18, height 5' 1 (1.549 m), weight 43.5 kg, last menstrual period 04/12/2013, SpO2 100%.   Intake/Output Summary (Last 24 hours) at 02/15/2024 1517 Last data filed at 02/15/2024 1300 Gross per 24 hour  Intake 480 ml  Output 4650 ml  Net -4170 ml    Exam Gen:- Awake Alert, no acute distress  HEENT:- [place].AT, No sclera icterus Neck-Supple Neck,No JVD,.  Lungs-improved air  movement, no adventitious sounds CV- S1, S2 normal, regular Abd-  +ve B.Sounds, Abd Soft, No tenderness, CVA tenderness    Extremity/Skin:- No  edema,   good pulses Psych-affect is appropriate, oriented x3 Neuro-no new focal deficits, no tremors  GU--Foley in situ, failed voiding trial will need to keep Foley for outpatient follow-up with urologist    Data Review   CBC w Diff:  Lab Results  Component Value Date   WBC 3.2 (L) 02/14/2024   HGB 8.1 (L) 02/14/2024   HCT 24.8 (L) 02/14/2024   PLT 159 02/14/2024   LYMPHOPCT 16 02/10/2024   MONOPCT 8 02/10/2024   EOSPCT 3 02/10/2024   BASOPCT 1 02/10/2024    CMP:  Lab Results  Component Value Date   NA 139 02/15/2024   K 3.0 (L) 02/15/2024   CL 106 02/15/2024   CO2 25 02/15/2024   BUN 7 02/15/2024   CREATININE 0.71 02/15/2024   CREATININE 0.59 11/10/2010   PROT 7.6 02/10/2024   ALBUMIN 3.1 (L) 02/15/2024   BILITOT 0.7 02/10/2024   ALKPHOS 59 02/10/2024   AST 22 02/10/2024   ALT 14 02/10/2024  .  Total Discharge time is about 33 minutes  Rendall Carwin M.D on 02/15/2024 at 3:17 PM  Go to [email or web address] -  for contact info  [facility] - Office  [phone]

## 2024-02-15 NOTE — Discharge Instructions (Addendum)
 1)Very Low-salt diet advised---Less than 2 gm of Sodium per day advised----ok to use Mrs DASH salt substitute instead of Salt 2)Weigh yourself daily, call if you gain more than 3 pounds in 1 day or more than 5 pounds in 1 week as your diuretic medications may need to be adjusted 3)Limit your Fluid  intake to no more than 60 ounces (1.8 Liters) per day 4)Avoid ibuprofen /Advil /Aleve/Motrin Josefine Powders/Naproxen/BC powders/Meloxicam/Diclofenac/Indomethacin and other Nonsteroidal anti-inflammatory medications as these will make you more likely to bleed and can cause stomach ulcers, can also cause Kidney problems.  5)Please follow-up with Urologist Dr. Sherrilee in about --- in 1 week  for Foley catheter removal and Voiding trial and for recheck and follow-up evaluation  in his office----[facility], [address], [place] [address] Phone Number----[phone]

## 2024-02-15 NOTE — Plan of Care (Signed)
  Problem: Education: Goal: Knowledge of General Education information will improve Description: Including pain rating scale, medication(s)/side effects and non-pharmacologic comfort measures Outcome: Progressing   Problem: Health Behavior/Discharge Planning: Goal: Ability to manage health-related needs will improve Outcome: Progressing   Problem: Clinical Measurements: Goal: Ability to maintain clinical measurements within normal limits will improve Outcome: Progressing Goal: Will remain free from infection Outcome: Progressing Goal: Diagnostic test results will improve Outcome: Progressing Goal: Respiratory complications will improve Outcome: Progressing Goal: Cardiovascular complication will be avoided Outcome: Progressing   Problem: Activity: Goal: Risk for activity intolerance will decrease Outcome: Progressing   Problem: Coping: Goal: Level of anxiety will decrease Outcome: Progressing   Problem: Pain Managment: Goal: General experience of comfort will improve and/or be controlled Outcome: Progressing   Problem: Safety: Goal: Ability to remain free from injury will improve Outcome: Progressing   Problem: Skin Integrity: Goal: Risk for impaired skin integrity will decrease Outcome: Progressing   Problem: Nutrition: Goal: Adequate nutrition will be maintained Outcome: Not Progressing   Problem: Elimination: Goal: Will not experience complications related to bowel motility Outcome: Not Progressing Goal: Will not experience complications related to urinary retention Outcome: Not Progressing

## 2024-02-15 NOTE — Progress Notes (Signed)
 Mobility Specialist Progress Note:    02/15/24 1410  Mobility  Activity Ambulated with assistance  Level of Assistance Independent  Assistive Device None  Distance Ambulated (ft) 200 ft  Range of Motion/Exercises Active;All extremities  Activity Response Tolerated well  Mobility Referral Yes  Mobility visit 1 Mobility  Mobility Specialist Start Time (ACUTE ONLY) 1410  Mobility Specialist Stop Time (ACUTE ONLY) 1430  Mobility Specialist Time Calculation (min) (ACUTE ONLY) 20 min   Pt received in bed, agreeable to mobility. Independently able to stand and ambulate with no AD. Tolerated well,asx throughout. Returned supine, all needs met.  Everett Ehrler Mobility Specialist Please contact via Special educational needs teacher or  Rehab office at [phone]

## 2024-02-15 NOTE — Plan of Care (Signed)
 Problem: Education: Goal: Knowledge of General Education information will improve Description: Including pain rating scale, medication(s)/side effects and non-pharmacologic comfort measures 02/15/2024 1026 by Mathews Norleen POUR, RN Outcome: Adequate for Discharge 02/15/2024 0758 by Mathews Norleen POUR, RN Outcome: Progressing 02/15/2024 0758 by Mathews Norleen POUR, RN Outcome: Progressing   Problem: Health Behavior/Discharge Planning: Goal: Ability to manage health-related needs will improve 02/15/2024 1026 by Arelene Moroni, Norleen POUR, RN Outcome: Adequate for Discharge 02/15/2024 0758 by Mathews Norleen POUR, RN Outcome: Progressing 02/15/2024 0758 by Mathews Norleen POUR, RN Outcome: Progressing   Problem: Clinical Measurements: Goal: Ability to maintain clinical measurements within normal limits will improve 02/15/2024 1026 by Mathews Norleen POUR, RN Outcome: Adequate for Discharge 02/15/2024 0758 by Mathews Norleen POUR, RN Outcome: Progressing 02/15/2024 0758 by Mathews Norleen POUR, RN Outcome: Progressing Goal: Will remain free from infection 02/15/2024 1026 by Mathews Norleen POUR, RN Outcome: Adequate for Discharge 02/15/2024 0758 by Mathews Norleen POUR, RN Outcome: Progressing 02/15/2024 0758 by Mathews Norleen POUR, RN Outcome: Progressing Goal: Diagnostic test results will improve 02/15/2024 1026 by Mathews Norleen POUR, RN Outcome: Adequate for Discharge 02/15/2024 0758 by Mathews Norleen POUR, RN Outcome: Progressing 02/15/2024 0758 by Mathews Norleen POUR, RN Outcome: Progressing Goal: Respiratory complications will improve 02/15/2024 1026 by Mathews Norleen POUR, RN Outcome: Adequate for Discharge 02/15/2024 0758 by Mathews Norleen POUR, RN Outcome: Progressing 02/15/2024 0758 by Mathews Norleen POUR, RN Outcome: Progressing Goal: Cardiovascular complication will be avoided 02/15/2024 1026 by Mathews Norleen POUR, RN Outcome: Adequate for Discharge 02/15/2024 0758 by Mathews Norleen POUR, RN Outcome: Progressing 02/15/2024 0758 by Mathews Norleen POUR, RN Outcome: Progressing   Problem: Activity: Goal: Risk for activity  intolerance will decrease 02/15/2024 1026 by Mathews Norleen POUR, RN Outcome: Adequate for Discharge 02/15/2024 0758 by Mathews Norleen POUR, RN Outcome: Progressing 02/15/2024 0758 by Mathews Norleen POUR, RN Outcome: Progressing   Problem: Nutrition: Goal: Adequate nutrition will be maintained 02/15/2024 1026 by Mathews Norleen POUR, RN Outcome: Adequate for Discharge 02/15/2024 0758 by Mathews Norleen POUR, RN Outcome: Progressing 02/15/2024 0758 by Mathews Norleen POUR, RN Outcome: Progressing   Problem: Coping: Goal: Level of anxiety will decrease 02/15/2024 1026 by Mathews Norleen POUR, RN Outcome: Adequate for Discharge 02/15/2024 0758 by Mathews Norleen POUR, RN Outcome: Progressing 02/15/2024 0758 by Mathews Norleen POUR, RN Outcome: Progressing   Problem: Elimination: Goal: Will not experience complications related to bowel motility 02/15/2024 1026 by Mathews Norleen POUR, RN Outcome: Adequate for Discharge 02/15/2024 0758 by Mathews Norleen POUR, RN Outcome: Progressing 02/15/2024 0758 by Mathews Norleen POUR, RN Outcome: Progressing Goal: Will not experience complications related to urinary retention 02/15/2024 1026 by Kealohilani Maiorino, Norleen POUR, RN Outcome: Adequate for Discharge 02/15/2024 0758 by Mathews Norleen POUR, RN Outcome: Progressing 02/15/2024 0758 by Mathews Norleen POUR, RN Outcome: Progressing   Problem: Pain Managment: Goal: General experience of comfort will improve and/or be controlled 02/15/2024 1026 by Mathews Norleen POUR, RN Outcome: Adequate for Discharge 02/15/2024 0758 by Mathews Norleen POUR, RN Outcome: Progressing 02/15/2024 0758 by Mathews Norleen POUR, RN Outcome: Progressing   Problem: Safety: Goal: Ability to remain free from injury will improve 02/15/2024 1026 by Mathews Norleen POUR, RN Outcome: Adequate for Discharge 02/15/2024 0758 by Mathews Norleen POUR, RN Outcome: Progressing 02/15/2024 0758 by Mathews Norleen POUR, RN Outcome: Progressing   Problem: Skin Integrity: Goal: Risk for impaired skin integrity will decrease 02/15/2024 1026 by Mathews Norleen POUR, RN Outcome: Adequate for Discharge 02/15/2024  0758 by Mathews Norleen POUR, RN Outcome: Progressing 02/15/2024 0758 by Mathews,  Norleen POUR, RN Outcome: Progressing   Problem: Education: Goal: Ability to describe self-care measures that may prevent or decrease complications (Diabetes Survival Skills Education) will improve 02/15/2024 1026 by Mathews Norleen POUR, RN Outcome: Adequate for Discharge 02/15/2024 0758 by Mathews Norleen POUR, RN Outcome: Progressing 02/15/2024 0758 by Mathews Norleen POUR, RN Outcome: Progressing Goal: Individualized Educational Video(s) 02/15/2024 1026 by Mathews Norleen POUR, RN Outcome: Adequate for Discharge 02/15/2024 0758 by Mathews Norleen POUR, RN Outcome: Progressing 02/15/2024 0758 by Mathews Norleen POUR, RN Outcome: Progressing   Problem: Coping: Goal: Ability to adjust to condition or change in health will improve 02/15/2024 1026 by Korvin Valentine, Norleen POUR, RN Outcome: Adequate for Discharge 02/15/2024 0758 by Mathews Norleen POUR, RN Outcome: Progressing 02/15/2024 0758 by Mathews Norleen POUR, RN Outcome: Progressing   Problem: Fluid Volume: Goal: Ability to maintain a balanced intake and output will improve 02/15/2024 1026 by Mathews Norleen POUR, RN Outcome: Adequate for Discharge 02/15/2024 0758 by Mathews Norleen POUR, RN Outcome: Progressing 02/15/2024 0758 by Mathews Norleen POUR, RN Outcome: Progressing   Problem: Health Behavior/Discharge Planning: Goal: Ability to identify and utilize available resources and services will improve 02/15/2024 1026 by Mathews Norleen POUR, RN Outcome: Adequate for Discharge 02/15/2024 0758 by Mathews Norleen POUR, RN Outcome: Progressing 02/15/2024 0758 by Mathews Norleen POUR, RN Outcome: Progressing Goal: Ability to manage health-related needs will improve 02/15/2024 1026 by Nikka Hakimian, Norleen POUR, RN Outcome: Adequate for Discharge 02/15/2024 0758 by Mathews Norleen POUR, RN Outcome: Progressing 02/15/2024 0758 by Mathews Norleen POUR, RN Outcome: Progressing   Problem: Metabolic: Goal: Ability to maintain appropriate glucose levels will improve 02/15/2024 1026 by Mathews Norleen POUR, RN Outcome: Adequate  for Discharge 02/15/2024 0758 by Mathews Norleen POUR, RN Outcome: Progressing 02/15/2024 0758 by Mathews Norleen POUR, RN Outcome: Progressing   Problem: Nutritional: Goal: Maintenance of adequate nutrition will improve 02/15/2024 1026 by Mathews Norleen POUR, RN Outcome: Adequate for Discharge 02/15/2024 0758 by Mathews Norleen POUR, RN Outcome: Progressing 02/15/2024 0758 by Mathews Norleen POUR, RN Outcome: Progressing Goal: Progress toward achieving an optimal weight will improve 02/15/2024 1026 by Mathews Norleen POUR, RN Outcome: Adequate for Discharge 02/15/2024 0758 by Mathews Norleen POUR, RN Outcome: Progressing 02/15/2024 0758 by Mathews Norleen POUR, RN Outcome: Progressing   Problem: Skin Integrity: Goal: Risk for impaired skin integrity will decrease 02/15/2024 1026 by Mathews Norleen POUR, RN Outcome: Adequate for Discharge 02/15/2024 0758 by Mathews Norleen POUR, RN Outcome: Progressing 02/15/2024 0758 by Mathews Norleen POUR, RN Outcome: Progressing   Problem: Tissue Perfusion: Goal: Adequacy of tissue perfusion will improve 02/15/2024 1026 by Mathews Norleen POUR, RN Outcome: Adequate for Discharge 02/15/2024 0758 by Mathews Norleen POUR, RN Outcome: Progressing 02/15/2024 0758 by Mathews Norleen POUR, RN Outcome: Progressing

## 2024-02-19 ENCOUNTER — Encounter: Payer: Self-pay | Admitting: Urology

## 2024-02-19 ENCOUNTER — Ambulatory Visit (INDEPENDENT_AMBULATORY_CARE_PROVIDER_SITE_OTHER): Admitting: Urology

## 2024-02-19 VITALS — BP 104/70 | HR 116 | Ht 61.0 in | Wt 89.0 lb

## 2024-02-19 DIAGNOSIS — R339 Retention of urine, unspecified: Secondary | ICD-10-CM | POA: Diagnosis not present

## 2024-02-19 MED ORDER — CIPROFLOXACIN HCL 500 MG PO TABS
500.0000 mg | ORAL_TABLET | Freq: Once | ORAL | Status: AC
  Administered 2024-02-19: 500 mg via ORAL

## 2024-02-19 MED ORDER — PHENAZOPYRIDINE HCL 200 MG PO TABS: 200.0000 mg | ORAL_TABLET | Freq: Three times a day (TID) | ORAL | 0 refills | Status: DC | PRN

## 2024-02-19 NOTE — Progress Notes (Signed)
 Assessment: 1. Urinary retention     Plan: I personally reviewed the patient's chart including provider notes, lab and imaging results. Continue tamsulosin  I discussed options for management of her urinary retention including intermittent catheterization or replacement of a Foley catheter.  She elected to have the Foley catheter replaced. Cipro  x 1 following foley removal/re-insertion Return to office in 1 week for voiding trial.  Chief Complaint:  Chief Complaint  Patient presents with   Urinary Retention    History of Present Illness:  Monique Nguyen is a 53 y.o. female who is seen in consultation from Rendall Carwin, MD for evaluation of urinary retention. She was admitted to [facility] on 02/10/2024 with abdominal pain for several days.  She did not have any significant change in her urinary symptoms leading up to her hospitalization.  She was not experiencing frequency, urgency, or dysuria.  No problems with constipation. She was found to have urinary retention and a Foley catheter was placed with return of 1450 mL. CT imaging showed bilateral hydronephrosis with a markedly distended bladder. She failed a voiding trial during her hospital course and the Foley catheter was replaced. She was discharged on tamsulosin . Her catheter has been draining well.  No history of frequent UTIs. She has a prior history of interstitial cystitis and underwent hydrodistention in 2001.  She has not had problems with IC recently.  Past Medical History:  Past Medical History:  Diagnosis Date   Alcohol  abuse    Anxiety    Bacterial vaginosis    Chronic abdominal pain    Chronic pain    Depression    Diabetes mellitus without complication (HCC)    GERD (gastroesophageal reflux disease)    IBS (irritable bowel syndrome)    Pancreatitis    ETOH   Panic attacks    Psoriasis     Past Surgical History:  Past Surgical History:  Procedure Laterality Date   BLADDER SURGERY      X 3   ESOPHAGOGASTRODUODENOSCOPY (EGD) WITH PROPOFOL  N/A 10/16/2018   Procedure: ESOPHAGOGASTRODUODENOSCOPY (EGD) WITH PROPOFOL ;  Surgeon: Shaaron Lamar HERO, MD;  [facility]: [facility];  Service: Endoscopy;  Laterality: N/A;   ESOPHAGOGASTRODUODENOSCOPY (EGD) WITH PROPOFOL  N/A 10/22/2018   Procedure: ESOPHAGOGASTRODUODENOSCOPY (EGD) WITH PROPOFOL ;  Surgeon: Kallie Manuelita BROCKS, MD;  Location: [facility];  Service: General;  Laterality: N/A;   ESOPHAGOGASTRODUODENOSCOPY (EGD) WITH PROPOFOL   02/01/2019   Procedure: ESOPHAGOGASTRODUODENOSCOPY (EGD) WITH PROPOFOL ;  Surgeon: Harvey Margo CROME, MD;  Location: [facility];  Service: Endoscopy;;   EUS N/A 10/11/2012   Dr. Teressa: 3.7 cm cystic lesion in body of pancreas with large amount of debris, s/p FNA with reddish, milky fluid, main pancreatic duct normal,    EUS  12/2015   mild to moderate chronic pancreatitis, pancreatic duct with dilation and intraductal stones, measuring up to 6 mm in diameter, no abnormality in main bile duct, abnormal lymph nodes in perigastric region, largest measuring 12 mm in diameter, s/p fine needle biopsy. Pathology with multiple fragments of lymphoid tissue with granulomatous inflammation, negative GMS and AFB stains,   PEG PLACEMENT N/A 10/22/2018   Procedure: PERCUTANEOUS ENDOSCOPIC GASTROSTOMY (PEG) PLACEMENT with endoscopy;  Surgeon: Kallie Manuelita BROCKS, MD;  Location: [facility];  Service: General;  Laterality: N/A;    Allergies:  Allergies  Allergen Reactions   Doxycycline  Other (See Comments)    headaches   Sulfa  Antibiotics Nausea And Vomiting and Other (See Comments)    Blurred vision and headaches  Family History:  Family History  Problem Relation Age of Onset   Depression Mother        living   Pancreatic cancer Father        PATIENT STATES PROSTATE CANCER. UNCLEAR IF ACTUAL PANCREATIC CANCER   Colon cancer Neg Hx    Colon polyps Neg Hx     Social History:  Social History   Tobacco Use   Smoking  status: Some Days    Current packs/day: 1.00    Average packs/day: 1 pack/day for 8.0 years (8.0 ttl pk-yrs)    Types: Cigarettes   Smokeless tobacco: Never   Tobacco comments:    States no smoking since 5/21  Vaping Use   Vaping status: Never Used  Substance Use Topics   Alcohol  use: Yes    Alcohol /week: 3.0 standard drinks of alcohol     Types: 3 Cans of beer per week    Comment: last drink 06/05/18, hx of ETOH abuse   Drug use: Not Currently    Types: Marijuana    Comment: Smoked tonight 02/01/2019    Review of symptoms:  Constitutional:  Negative for unexplained weight loss, night sweats, fever, chills ENT:  Negative for nose bleeds, sinus pain, painful swallowing CV:  Negative for chest pain, shortness of breath, exercise intolerance, palpitations, loss of consciousness Resp:  Negative for cough, wheezing, shortness of breath GI:  Negative for nausea, vomiting, diarrhea, bloody stools GU:  Positives noted in HPI; otherwise negative for gross hematuria, dysuria, urinary incontinence Neuro:  Negative for seizures, poor balance, limb weakness, slurred speech Psych:  Negative for lack of energy, depression, anxiety Endocrine:  Negative for polydipsia, polyuria, symptoms of hypoglycemia (dizziness, hunger, sweating) Hematologic:  Negative for anemia, purpura, petechia, prolonged or excessive bleeding, use of anticoagulants  Allergic:  Negative for difficulty breathing or choking as a result of exposure to anything; no shellfish allergy; no allergic response (rash/itch) to materials, foods  Physical exam: BP 104/70   Pulse (!) 116   Ht 5' 1 (1.549 m)   Wt 89 lb (40.4 kg)   LMP 04/12/2013   BMI 16.82 kg/m  GENERAL APPEARANCE:  Well appearing, well developed, well nourished, NAD HEENT: Atraumatic, Normocephalic, oropharynx clear. NECK: Supple without lymphadenopathy or thyromegaly. LUNGS: Clear to auscultation bilaterally. HEART: Regular Rate and Rhythm without murmurs,  gallops, or rubs. ABDOMEN: Soft, non-tender, No Masses. EXTREMITIES: Moves all extremities well.  Without clubbing, cyanosis, or edema. NEUROLOGIC:  Alert and oriented x 3, normal gait, CN II-XII grossly intact.  MENTAL STATUS:  Appropriate. BACK:  Non-tender to palpation.  No CVAT SKIN:  Warm, dry and intact.   GU:  foley in place draining clear yellow urine  Results: None  Procedure:  VOIDING TRIAL  A voiding trial was performed in the office today.   Volume of sterile water  instilled: 360 mL Foley catheter removed intact. Volume voided by patient: 0 mL

## 2024-02-19 NOTE — Progress Notes (Signed)
 Fill and Pull Catheter Removal  Patient is present today for a catheter removal.  Patient was cleaned and prepped in a sterile fashion of sterile water  was instilled into the bladder when the patient felt the urge to urinate, 10ml of water  was then drained from the balloon.  A 16FR foley cath was removed from the bladder no complications were noted .  Patient was then given some time to void on their own.  Patient cannot void. Patient tolerated well.  Performed by: Roselee CMA    Simple Catheter Placement  Due to urinary retention patient is present today for a foley cath placement.  Patient was cleaned and prepped in a sterile fashion with betadine. A 16 FR foley catheter was inserted, urine return was noted  , urine was clear in color.  The balloon was filled with 10cc of sterile water .  A night bag was attached for drainage. Patient was given instruction on proper catheter care.  Patient tolerated well, no complications were noted   Performed by: Rahima Fleishman CMA

## 2024-02-25 NOTE — Progress Notes (Signed)
 Assessment: 1. Urinary retention      Plan: Continue tamuslosin I discussed options for management of her urinary retention. She wishes to continue with the catheter today.  She does not want to attempt a voiding trial at this time. Return to office in 7-10 days for voiding trial.  Chief Complaint:  Chief Complaint  Patient presents with   Urinary Retention    History of Present Illness:  Monique Nguyen is a 53 y.o. female who is seen for further evaluation of urinary retention. She was admitted to [facility] on 02/10/2024 with abdominal pain for several days.  She did not have any significant change in her urinary symptoms leading up to her hospitalization.  She was not experiencing frequency, urgency, or dysuria.  No problems with constipation. She was found to have urinary retention and a Foley catheter was placed with return of 1450 mL. CT imaging showed bilateral hydronephrosis with a markedly distended bladder. She failed a voiding trial during her hospital course and the Foley catheter was replaced. She was discharged on tamsulosin . Her catheter has been draining well.  No history of frequent UTIs. She has a prior history of interstitial cystitis and underwent hydrodistention in 2001.  She has not had problems with IC recently. She also reports a prior history of urinary retention requiring intermittent catheterization a number of years ago.  She failed a voiding trial on 02/19/24.  Her foley was replaced. She presents today for repeat voiding trial. Her catheter has been draining well.  She continues on tamsulosin .  Portions of the above documentation were copied from a prior visit for review purposes only.   Past Medical History:  Past Medical History:  Diagnosis Date   Alcohol  abuse    Anxiety    Bacterial vaginosis    Chronic abdominal pain    Chronic pain    Depression    Diabetes mellitus without complication (HCC)    GERD (gastroesophageal  reflux disease)    IBS (irritable bowel syndrome)    Pancreatitis    ETOH   Panic attacks    Psoriasis     Past Surgical History:  Past Surgical History:  Procedure Laterality Date   BLADDER SURGERY     X 3   ESOPHAGOGASTRODUODENOSCOPY (EGD) WITH PROPOFOL  N/A 10/16/2018   Procedure: ESOPHAGOGASTRODUODENOSCOPY (EGD) WITH PROPOFOL ;  Surgeon: Shaaron Lamar HERO, MD;  [facility];  Service: Endoscopy;  Laterality: N/A;   ESOPHAGOGASTRODUODENOSCOPY (EGD) WITH PROPOFOL  N/A 10/22/2018   Procedure: ESOPHAGOGASTRODUODENOSCOPY (EGD) WITH PROPOFOL ;  Surgeon: Kallie Manuelita BROCKS, MD;  Location: [facility];  Service: General;  Laterality: N/A;   ESOPHAGOGASTRODUODENOSCOPY (EGD) WITH PROPOFOL   02/01/2019   Procedure: ESOPHAGOGASTRODUODENOSCOPY (EGD) WITH PROPOFOL ;  Surgeon: Harvey Margo CROME, MD;  [facility]: [facility];  Service: Endoscopy;;   EUS N/A 10/11/2012   Dr. Teressa: 3.7 cm cystic lesion in body of pancreas with large amount of debris, s/p FNA with reddish, milky fluid, main pancreatic duct normal,    EUS  12/2015   mild to moderate chronic pancreatitis, pancreatic duct with dilation and intraductal stones, measuring up to 6 mm in diameter, no abnormality in main bile duct, abnormal lymph nodes in perigastric region, largest measuring 12 mm in diameter, s/p fine needle biopsy. Pathology with multiple fragments of lymphoid tissue with granulomatous inflammation, negative GMS and AFB stains,   PEG PLACEMENT N/A 10/22/2018   Procedure: PERCUTANEOUS ENDOSCOPIC GASTROSTOMY (PEG) PLACEMENT with endoscopy;  Surgeon: Kallie Manuelita BROCKS, MD;  Location: [facility];  Service: General;  Laterality: N/A;    Allergies:  Allergies  Allergen Reactions   Doxycycline  Other (See Comments)    headaches   Sulfa  Antibiotics Nausea And Vomiting and Other (See Comments)    Blurred vision and headaches    Family History:  Family History  Problem Relation Age of Onset   Depression Mother        living    Pancreatic cancer Father        PATIENT STATES PROSTATE CANCER. UNCLEAR IF ACTUAL PANCREATIC CANCER   Colon cancer Neg Hx    Colon polyps Neg Hx     Social History:  Social History   Tobacco Use   Smoking status: Some Days    Current packs/day: 1.00    Average packs/day: 1 pack/day for 8.0 years (8.0 ttl pk-yrs)    Types: Cigarettes   Smokeless tobacco: Never   Tobacco comments:    States no smoking since 5/21  Vaping Use   Vaping status: Never Used  Substance Use Topics   Alcohol  use: Yes    Alcohol /week: 3.0 standard drinks of alcohol     Types: 3 Cans of beer per week    Comment: last drink 06/05/18, hx of ETOH abuse   Drug use: Not Currently    Types: Marijuana    Comment: Smoked tonight 02/01/2019    ROS: Constitutional:  Negative for fever, chills, weight loss CV: Negative for chest pain, previous MI, hypertension Respiratory:  Negative for shortness of breath, wheezing, sleep apnea, frequent cough GI:  Negative for nausea, vomiting, bloody stool, GERD  Physical exam: LMP 04/12/2013  GENERAL APPEARANCE:  Well appearing, well developed, well nourished, NAD HEENT:  Atraumatic, normocephalic, oropharynx clear NECK:  Supple without lymphadenopathy or thyromegaly ABDOMEN:  Soft, non-tender, no masses EXTREMITIES:  Moves all extremities well, without clubbing, cyanosis, or edema NEUROLOGIC:  Alert and oriented x 3, normal gait, CN II-XII grossly intact MENTAL STATUS:  appropriate BACK:  Non-tender to palpation, No CVAT SKIN:  Warm, dry, and intact GU:  foley draining orange urine  Results: None

## 2024-02-26 ENCOUNTER — Encounter: Payer: Self-pay | Admitting: Urology

## 2024-02-26 ENCOUNTER — Ambulatory Visit (INDEPENDENT_AMBULATORY_CARE_PROVIDER_SITE_OTHER): Admitting: Urology

## 2024-02-26 VITALS — BP 107/74 | HR 139

## 2024-02-26 DIAGNOSIS — R339 Retention of urine, unspecified: Secondary | ICD-10-CM

## 2024-03-07 ENCOUNTER — Ambulatory Visit: Admitting: Urology

## 2024-03-07 ENCOUNTER — Encounter: Payer: Self-pay | Admitting: Urology

## 2024-03-07 VITALS — BP 101/68 | HR 137

## 2024-03-07 DIAGNOSIS — R339 Retention of urine, unspecified: Secondary | ICD-10-CM | POA: Diagnosis not present

## 2024-03-07 MED ORDER — PHENAZOPYRIDINE HCL 200 MG PO TABS: 200.0000 mg | ORAL_TABLET | Freq: Three times a day (TID) | ORAL | 0 refills | Status: DC | PRN

## 2024-03-07 MED ORDER — CIPROFLOXACIN HCL 500 MG PO TABS
500.0000 mg | ORAL_TABLET | Freq: Once | ORAL | Status: AC
  Administered 2024-03-07: 500 mg via ORAL

## 2024-03-07 NOTE — Addendum Note (Signed)
 Addended by: OBADIAH ROSELEE RAMAN on: 03/07/2024 10:10 AM   Modules accepted: Orders

## 2024-03-07 NOTE — Progress Notes (Signed)
 Assessment: 1. Urinary retention     Plan: Continue tamuslosin Cipro  x 1 following foley removal I discussed options for management of her urinary retention including replacing the Foley catheter and CIC.  I advised her that CIC would likely be her best option at this time to allow return of bladder function. Begin CIC 3 times daily.  Patient instructed on technique and supplies provided. Return to office in 2 weeks.  Chief Complaint:  Chief Complaint  Patient presents with   Urinary Retention    History of Present Illness:  Monique Nguyen is a 53 y.o. female who is seen for further evaluation of urinary retention. She was admitted to [facility] on 02/10/2024 with abdominal pain for several days.  She did not have any significant change in her urinary symptoms leading up to her hospitalization.  She was not experiencing frequency, urgency, or dysuria.  No problems with constipation. She was found to have urinary retention and a Foley catheter was placed with return of 1450 mL. CT imaging showed bilateral hydronephrosis with a markedly distended bladder. She failed a voiding trial during her hospital course and the Foley catheter was replaced. She was discharged on tamsulosin . Her catheter has been draining well.  No history of frequent UTIs. She has a prior history of interstitial cystitis and underwent hydrodistention in 2001.  She has not had problems with IC recently. She also reports a prior history of urinary retention requiring intermittent catheterization a number of years ago.  She failed a voiding trial on 02/19/24.  Her foley was replaced. She did not wish to try a voiding trial at her visit on 02/26/2024 so the Foley was continued.  She presents today for repeat voiding trial. Her catheter has been draining well.  She continues on tamsulosin .  Portions of the above documentation were copied from a prior visit for review purposes only.   Past Medical  History:  Past Medical History:  Diagnosis Date   Alcohol  abuse    Anxiety    Bacterial vaginosis    Chronic abdominal pain    Chronic pain    Depression    Diabetes mellitus without complication (HCC)    GERD (gastroesophageal reflux disease)    IBS (irritable bowel syndrome)    Pancreatitis    ETOH   Panic attacks    Psoriasis     Past Surgical History:  Past Surgical History:  Procedure Laterality Date   BLADDER SURGERY     X 3   ESOPHAGOGASTRODUODENOSCOPY (EGD) WITH PROPOFOL  N/A 10/16/2018   Procedure: ESOPHAGOGASTRODUODENOSCOPY (EGD) WITH PROPOFOL ;  Surgeon: Shaaron Lamar HERO, MD;  [facility];  Service: Endoscopy;  Laterality: N/A;   ESOPHAGOGASTRODUODENOSCOPY (EGD) WITH PROPOFOL  N/A 10/22/2018   Procedure: ESOPHAGOGASTRODUODENOSCOPY (EGD) WITH PROPOFOL ;  Surgeon: Kallie Manuelita BROCKS, MD;  Location: [facility];  Service: General;  Laterality: N/A;   ESOPHAGOGASTRODUODENOSCOPY (EGD) WITH PROPOFOL   02/01/2019   Procedure: ESOPHAGOGASTRODUODENOSCOPY (EGD) WITH PROPOFOL ;  Surgeon: Harvey Margo CROME, MD;  [facility];  Service: Endoscopy;;   EUS N/A 10/11/2012   Dr. Teressa: 3.7 cm cystic lesion in body of pancreas with large amount of debris, s/p FNA with reddish, milky fluid, main pancreatic duct normal,    EUS  12/2015   mild to moderate chronic pancreatitis, pancreatic duct with dilation and intraductal stones, measuring up to 6 mm in diameter, no abnormality in main bile duct, abnormal lymph nodes in perigastric region, largest measuring 12 mm in diameter, s/p fine  needle biopsy. Pathology with multiple fragments of lymphoid tissue with granulomatous inflammation, negative GMS and AFB stains,   PEG PLACEMENT N/A 10/22/2018   Procedure: PERCUTANEOUS ENDOSCOPIC GASTROSTOMY (PEG) PLACEMENT with endoscopy;  Surgeon: Kallie Manuelita BROCKS, MD;  Location: [facility];  Service: General;  Laterality: N/A;    Allergies:  Allergies  Allergen Reactions   Doxycycline  Other (See  Comments)    headaches   Sulfa  Antibiotics Nausea And Vomiting and Other (See Comments)    Blurred vision and headaches    Family History:  Family History  Problem Relation Age of Onset   Depression Mother        living   Pancreatic cancer Father        PATIENT STATES PROSTATE CANCER. UNCLEAR IF ACTUAL PANCREATIC CANCER   Colon cancer Neg Hx    Colon polyps Neg Hx     Social History:  Social History   Tobacco Use   Smoking status: Some Days    Current packs/day: 1.00    Average packs/day: 1 pack/day for 8.0 years (8.0 ttl pk-yrs)    Types: Cigarettes   Smokeless tobacco: Never   Tobacco comments:    States no smoking since 5/21  Vaping Use   Vaping status: Never Used  Substance Use Topics   Alcohol  use: Yes    Alcohol /week: 3.0 standard drinks of alcohol     Types: 3 Cans of beer per week    Comment: last drink 06/05/18, hx of ETOH abuse   Drug use: Not Currently    Types: Marijuana    Comment: Smoked tonight 02/01/2019    ROS: Constitutional:  Negative for fever, chills, weight loss CV: Negative for chest pain, previous MI, hypertension Respiratory:  Negative for shortness of breath, wheezing, sleep apnea, frequent cough GI:  Negative for nausea, vomiting, bloody stool, GERD  Physical exam: BP 101/68   Pulse (!) 137   LMP 04/12/2013  GENERAL APPEARANCE:  Well appearing, well developed, well nourished, NAD HEENT:  Atraumatic, normocephalic, oropharynx clear NECK:  Supple without lymphadenopathy or thyromegaly ABDOMEN:  Soft, non-tender, no masses EXTREMITIES:  Moves all extremities well, without clubbing, cyanosis, or edema NEUROLOGIC:  Alert and oriented x 3, normal gait, CN II-XII grossly intact MENTAL STATUS:  appropriate BACK:  Non-tender to palpation, No CVAT SKIN:  Warm, dry, and intact GU:  foley draining orange urine  Results: None  Procedure:  VOIDING TRIAL  A voiding trial was performed in the office today.   Volume of sterile water   instilled: 300 mL Foley catheter removed intact. Volume voided by patient: 0 mL Instructed to return to office if has not voided by 4 PM

## 2024-03-07 NOTE — Progress Notes (Signed)
 Fill and Pull Catheter Removal  Patient is present today for a catheter removal.  Patient was cleaned and prepped in a sterile fashion of sterile water  was instilled into the bladder when the patient felt the urge to urinate, 9mL of water  was then drained from the balloon.  A 16FR foley cath was removed from the bladder no complications were noted .  Patient was then given some time to void on their own.  Patient cannot void.  Performed by: Christia Coaxum CMA

## 2024-03-11 ENCOUNTER — Telehealth: Payer: Self-pay | Admitting: Urology

## 2024-03-11 NOTE — Telephone Encounter (Signed)
 Pt called and lvm to let us  know that she has not received any catheters as of yet. Pt has only two left. Please advise.

## 2024-03-11 NOTE — Telephone Encounter (Signed)
 Contacted the [facility] [employer] rep to make sure they were contacting her at the correct phone number. Per the rep, they have called and left 4 VM's on the number provided. Called patient to let her know, she gave a different number to give to [facility] to contact her on. Contacted the [facility] urology office to see if patient can pick up caths until hers are shipped. Per the office manager, bag placed up front for patient pickup.. Let patient know caths are ready for pickup and that [facility] would be contacting her. Patient expressed understanding.

## 2024-03-26 ENCOUNTER — Encounter: Payer: Self-pay | Admitting: Urology

## 2024-03-26 ENCOUNTER — Ambulatory Visit (INDEPENDENT_AMBULATORY_CARE_PROVIDER_SITE_OTHER): Admitting: Urology

## 2024-03-26 VITALS — BP 118/78 | HR 112 | Ht 61.0 in | Wt 87.0 lb

## 2024-03-26 DIAGNOSIS — R339 Retention of urine, unspecified: Secondary | ICD-10-CM

## 2024-03-26 LAB — URINALYSIS, ROUTINE W REFLEX MICROSCOPIC
Bilirubin, UA: NEGATIVE
Glucose, UA: NEGATIVE
Ketones, UA: NEGATIVE
Nitrite, UA: NEGATIVE
RBC, UA: NEGATIVE
Specific Gravity, UA: 1.015 (ref 1.005–1.030)
Urobilinogen, Ur: 1 mg/dL (ref 0.2–1.0)
pH, UA: 7 (ref 5.0–7.5)

## 2024-03-26 LAB — MICROSCOPIC EXAMINATION

## 2024-03-26 MED ORDER — PHENAZOPYRIDINE HCL 200 MG PO TABS: 200.0000 mg | ORAL_TABLET | Freq: Three times a day (TID) | ORAL | 1 refills | Status: AC | PRN

## 2024-03-26 MED ORDER — TAMSULOSIN HCL 0.4 MG PO CAPS: 0.4000 mg | ORAL_CAPSULE | Freq: Every day | ORAL | 5 refills | Status: AC

## 2024-03-26 NOTE — Progress Notes (Signed)
 Assessment: 1. Urinary retention     Plan: Continue CIC.  Recommend increasing to 4 times daily to keep bladder volumes around 500 mL. Continue tamuslosin Continue to use Pyridium  as needed. Recommend further evaluation with urodynamics. Return to office after urodynamics.   Chief Complaint:  Chief Complaint  Patient presents with   Urinary Retention    History of Present Illness:  Monique Nguyen is a 53 y.o. female who is seen for further evaluation of urinary retention. She was admitted to [facility] on 02/10/2024 with abdominal pain for several days.  She did not have any significant change in her urinary symptoms leading up to her hospitalization.  She was not experiencing frequency, urgency, or dysuria.  No problems with constipation. She was found to have urinary retention and a Foley catheter was placed with return of 1450 mL. CT imaging showed bilateral hydronephrosis with a markedly distended bladder. She failed a voiding trial during her hospital course and the Foley catheter was replaced. She was discharged on tamsulosin . Her catheter has been draining well.  No history of frequent UTIs. She has a prior history of interstitial cystitis and underwent hydrodistention in 2001.  She has not had problems with IC recently. She also reports a prior history of urinary retention requiring intermittent catheterization a number of years ago.  She failed a voiding trial on 02/19/24.  Her foley was replaced. She did not wish to try a voiding trial at her visit on 02/26/2024 so the Foley was continued. She was unable to void with removal of the catheter on 03/07/2024.  She was started on intermittent catheterization.  She returns today for follow-up.  She continues to require CIC.  She has been able to void spontaneously.  No problems with catheterization.  Cath volumes range from 400 to 1000 mL.  She continues on tamsulosin .  She is taking Pyridium  as needed.  No gross  hematuria.  Portions of the above documentation were copied from a prior visit for review purposes only.   Past Medical History:  Past Medical History:  Diagnosis Date   Alcohol  abuse    Anxiety    Bacterial vaginosis    Chronic abdominal pain    Chronic pain    Depression    Diabetes mellitus without complication (HCC)    GERD (gastroesophageal reflux disease)    IBS (irritable bowel syndrome)    Pancreatitis    ETOH   Panic attacks    Psoriasis     Past Surgical History:  Past Surgical History:  Procedure Laterality Date   BLADDER SURGERY     X 3   ESOPHAGOGASTRODUODENOSCOPY (EGD) WITH PROPOFOL  N/A 10/16/2018   Procedure: ESOPHAGOGASTRODUODENOSCOPY (EGD) WITH PROPOFOL ;  Surgeon: Shaaron Lamar HERO, MD;  [facility];  Service: Endoscopy;  Laterality: N/A;   ESOPHAGOGASTRODUODENOSCOPY (EGD) WITH PROPOFOL  N/A 10/22/2018   Procedure: ESOPHAGOGASTRODUODENOSCOPY (EGD) WITH PROPOFOL ;  Surgeon: Kallie Manuelita BROCKS, MD;  Location: [facility];  Service: General;  Laterality: N/A;   ESOPHAGOGASTRODUODENOSCOPY (EGD) WITH PROPOFOL   02/01/2019   Procedure: ESOPHAGOGASTRODUODENOSCOPY (EGD) WITH PROPOFOL ;  Surgeon: Harvey Margo CROME, MD;  [facility];  Service: Endoscopy;;   EUS N/A 10/11/2012   Dr. Teressa: 3.7 cm cystic lesion in body of pancreas with large amount of debris, s/p FNA with reddish, milky fluid, main pancreatic duct normal,    EUS  12/2015   mild to moderate chronic pancreatitis, pancreatic duct with dilation and intraductal stones, measuring up to 6 mm in  diameter, no abnormality in main bile duct, abnormal lymph nodes in perigastric region, largest measuring 12 mm in diameter, s/p fine needle biopsy. Pathology with multiple fragments of lymphoid tissue with granulomatous inflammation, negative GMS and AFB stains,   PEG PLACEMENT N/A 10/22/2018   Procedure: PERCUTANEOUS ENDOSCOPIC GASTROSTOMY (PEG) PLACEMENT with endoscopy;  Surgeon: Kallie Manuelita BROCKS, MD;  Location:  [facility];  Service: General;  Laterality: N/A;    Allergies:  Allergies  Allergen Reactions   Doxycycline  Other (See Comments)    headaches   Sulfa  Antibiotics Nausea And Vomiting and Other (See Comments)    Blurred vision and headaches    Family History:  Family History  Problem Relation Age of Onset   Depression Mother        living   Pancreatic cancer Father        PATIENT STATES PROSTATE CANCER. UNCLEAR IF ACTUAL PANCREATIC CANCER   Colon cancer Neg Hx    Colon polyps Neg Hx     Social History:  Social History   Tobacco Use   Smoking status: Some Days    Current packs/day: 1.00    Average packs/day: 1 pack/day for 8.0 years (8.0 ttl pk-yrs)    Types: Cigarettes   Smokeless tobacco: Never   Tobacco comments:    States no smoking since 5/21  Vaping Use   Vaping status: Never Used  Substance Use Topics   Alcohol  use: Yes    Alcohol /week: 3.0 standard drinks of alcohol     Types: 3 Cans of beer per week    Comment: last drink 06/05/18, hx of ETOH abuse   Drug use: Not Currently    Types: Marijuana    Comment: Smoked tonight 02/01/2019    ROS: Constitutional:  Negative for fever, chills, weight loss CV: Negative for chest pain, previous MI, hypertension Respiratory:  Negative for shortness of breath, wheezing, sleep apnea, frequent cough GI:  Negative for nausea, vomiting, bloody stool, GERD  Physical exam: BP 118/78   Pulse (!) 112   Ht 5' 1 (1.549 m)   Wt 87 lb (39.5 kg)   LMP 04/12/2013   BMI 16.44 kg/m  GENERAL APPEARANCE:  Well appearing, well developed, well nourished, NAD HEENT:  Atraumatic, normocephalic, oropharynx clear NECK:  Supple without lymphadenopathy or thyromegaly ABDOMEN:  Soft, non-tender, no masses EXTREMITIES:  Moves all extremities well, without clubbing, cyanosis, or edema NEUROLOGIC:  Alert and oriented x 3, normal gait, CN II-XII grossly intact MENTAL STATUS:  appropriate BACK:  Non-tender to palpation, No CVAT SKIN:  Warm,  dry, and intact  Results: U/A: 0-5 WBCs, 0-2 RBCs, few bacteria

## 2024-04-10 ENCOUNTER — Telehealth: Payer: Self-pay | Admitting: Urology

## 2024-04-10 NOTE — Telephone Encounter (Signed)
 Tried calling pt with no answer. LVM for return call to office.

## 2024-04-10 NOTE — Telephone Encounter (Signed)
 Attempted to reach pt again unsuccessfully. Left another message for pt to return call.

## 2024-04-10 NOTE — Telephone Encounter (Signed)
 Pt needs to get some catheters and she is hoping to get them from the [place] office since that is closer to her. Please Advise.

## 2024-04-10 NOTE — Telephone Encounter (Signed)
 Patient sees [facility] but caths will not be shipped until today. She would like to pick up a couple from our office.

## 2024-04-10 NOTE — Telephone Encounter (Signed)
 LMOM asking patient to please return call.

## 2024-04-11 NOTE — Telephone Encounter (Signed)
Returned pt's call, no answer, LMOM

## 2024-04-11 NOTE — Telephone Encounter (Signed)
 Pt called and lvm and stated she missed your call.

## 2024-04-29 ENCOUNTER — Telehealth: Payer: Self-pay | Admitting: Urology

## 2024-04-29 NOTE — Telephone Encounter (Signed)
 Patient called stating that she needs a refill on her catheters.  Her call  back number is [phone].

## 2024-05-08 ENCOUNTER — Encounter: Payer: Self-pay | Admitting: Gastroenterology

## 2024-06-12 ENCOUNTER — Telehealth: Payer: Self-pay | Admitting: Urology

## 2024-06-12 NOTE — Telephone Encounter (Signed)
 Called patient to speak with her regarding her referral for [facility].  [facility] does not accept her insurance, I need to speak with her about her options. Left a message for her to call back.  Monique Nguyen

## 2024-06-26 ENCOUNTER — Ambulatory Visit: Admitting: Gastroenterology

## 2024-06-27 ENCOUNTER — Encounter: Payer: Self-pay | Admitting: Gastroenterology
